# Patient Record
Sex: Female | Born: 1960
Health system: Southern US, Community
[De-identification: ages and names within clinical notes are randomized; demographics above are authoritative.]

## PROBLEM LIST (undated history)

## (undated) DIAGNOSIS — IMO0001 Reserved for inherently not codable concepts without codable children: Secondary | ICD-10-CM

## (undated) DIAGNOSIS — IMO0002 Reserved for concepts with insufficient information to code with codable children: Secondary | ICD-10-CM

## (undated) DIAGNOSIS — F329 Major depressive disorder, single episode, unspecified: Secondary | ICD-10-CM

## (undated) DIAGNOSIS — Z9221 Personal history of antineoplastic chemotherapy: Secondary | ICD-10-CM

## (undated) DIAGNOSIS — Z460 Encounter for fitting and adjustment of spectacles and contact lenses: Secondary | ICD-10-CM

## (undated) DIAGNOSIS — E785 Hyperlipidemia, unspecified: Secondary | ICD-10-CM

## (undated) DIAGNOSIS — M1990 Unspecified osteoarthritis, unspecified site: Secondary | ICD-10-CM

## (undated) DIAGNOSIS — F32A Depression, unspecified: Secondary | ICD-10-CM

## (undated) DIAGNOSIS — F419 Anxiety disorder, unspecified: Secondary | ICD-10-CM

## (undated) DIAGNOSIS — K219 Gastro-esophageal reflux disease without esophagitis: Secondary | ICD-10-CM

## (undated) DIAGNOSIS — E119 Type 2 diabetes mellitus without complications: Secondary | ICD-10-CM

## (undated) DIAGNOSIS — C50919 Malignant neoplasm of unspecified site of unspecified female breast: Secondary | ICD-10-CM

## (undated) DIAGNOSIS — Z923 Personal history of irradiation: Secondary | ICD-10-CM

## (undated) HISTORY — PX: WISDOM TOOTH EXTRACTION: SHX21

## (undated) HISTORY — DX: Hyperlipidemia, unspecified: E78.5

## (undated) HISTORY — DX: Reserved for concepts with insufficient information to code with codable children: IMO0002

## (undated) HISTORY — DX: Reserved for inherently not codable concepts without codable children: IMO0001

## (undated) HISTORY — DX: Malignant neoplasm of unspecified site of unspecified female breast: C50.919

## (undated) HISTORY — DX: Type 2 diabetes mellitus without complications: E11.9

---

## 1994-12-30 HISTORY — PX: BREAST BIOPSY: SHX20

## 1998-07-13 ENCOUNTER — Ambulatory Visit (HOSPITAL_COMMUNITY): Admission: RE | Admit: 1998-07-13 | Discharge: 1998-07-13 | Payer: Self-pay | Admitting: Obstetrics & Gynecology

## 1999-09-20 ENCOUNTER — Other Ambulatory Visit: Admission: RE | Admit: 1999-09-20 | Discharge: 1999-09-20 | Payer: Self-pay | Admitting: Obstetrics & Gynecology

## 2000-10-23 ENCOUNTER — Encounter: Payer: Self-pay | Admitting: Obstetrics & Gynecology

## 2000-10-23 ENCOUNTER — Ambulatory Visit (HOSPITAL_COMMUNITY): Admission: RE | Admit: 2000-10-23 | Discharge: 2000-10-23 | Payer: Self-pay | Admitting: Obstetrics & Gynecology

## 2000-10-23 ENCOUNTER — Other Ambulatory Visit: Admission: RE | Admit: 2000-10-23 | Discharge: 2000-10-23 | Payer: Self-pay | Admitting: Obstetrics & Gynecology

## 2001-11-13 ENCOUNTER — Ambulatory Visit (HOSPITAL_COMMUNITY): Admission: RE | Admit: 2001-11-13 | Discharge: 2001-11-13 | Payer: Self-pay | Admitting: Obstetrics & Gynecology

## 2001-11-13 ENCOUNTER — Encounter: Payer: Self-pay | Admitting: Obstetrics & Gynecology

## 2001-12-21 ENCOUNTER — Other Ambulatory Visit: Admission: RE | Admit: 2001-12-21 | Discharge: 2001-12-21 | Payer: Self-pay | Admitting: Obstetrics & Gynecology

## 2002-11-16 ENCOUNTER — Ambulatory Visit (HOSPITAL_COMMUNITY): Admission: RE | Admit: 2002-11-16 | Discharge: 2002-11-16 | Payer: Self-pay | Admitting: Obstetrics & Gynecology

## 2002-11-16 ENCOUNTER — Encounter: Payer: Self-pay | Admitting: Obstetrics & Gynecology

## 2002-12-13 ENCOUNTER — Other Ambulatory Visit: Admission: RE | Admit: 2002-12-13 | Discharge: 2002-12-13 | Payer: Self-pay | Admitting: Obstetrics & Gynecology

## 2003-12-20 ENCOUNTER — Ambulatory Visit (HOSPITAL_COMMUNITY): Admission: RE | Admit: 2003-12-20 | Discharge: 2003-12-20 | Payer: Self-pay | Admitting: Obstetrics & Gynecology

## 2003-12-20 ENCOUNTER — Other Ambulatory Visit: Admission: RE | Admit: 2003-12-20 | Discharge: 2003-12-20 | Payer: Self-pay | Admitting: Obstetrics & Gynecology

## 2005-01-04 ENCOUNTER — Other Ambulatory Visit: Admission: RE | Admit: 2005-01-04 | Discharge: 2005-01-04 | Payer: Self-pay | Admitting: Obstetrics & Gynecology

## 2005-01-04 ENCOUNTER — Ambulatory Visit (HOSPITAL_COMMUNITY): Admission: RE | Admit: 2005-01-04 | Discharge: 2005-01-04 | Payer: Self-pay | Admitting: Obstetrics & Gynecology

## 2005-06-18 ENCOUNTER — Other Ambulatory Visit: Admission: RE | Admit: 2005-06-18 | Discharge: 2005-06-18 | Payer: Self-pay | Admitting: Obstetrics & Gynecology

## 2006-02-04 ENCOUNTER — Ambulatory Visit (HOSPITAL_COMMUNITY): Admission: RE | Admit: 2006-02-04 | Discharge: 2006-02-04 | Payer: Self-pay | Admitting: Obstetrics & Gynecology

## 2006-02-07 ENCOUNTER — Other Ambulatory Visit: Admission: RE | Admit: 2006-02-07 | Discharge: 2006-02-07 | Payer: Self-pay | Admitting: Obstetrics & Gynecology

## 2006-06-30 ENCOUNTER — Ambulatory Visit: Payer: Self-pay | Admitting: Internal Medicine

## 2006-11-24 ENCOUNTER — Ambulatory Visit: Payer: Self-pay | Admitting: Internal Medicine

## 2007-02-05 ENCOUNTER — Ambulatory Visit (HOSPITAL_COMMUNITY): Admission: RE | Admit: 2007-02-05 | Discharge: 2007-02-05 | Payer: Self-pay | Admitting: Obstetrics & Gynecology

## 2007-02-06 ENCOUNTER — Ambulatory Visit: Payer: Self-pay | Admitting: Internal Medicine

## 2007-03-07 ENCOUNTER — Ambulatory Visit (HOSPITAL_COMMUNITY): Admission: RE | Admit: 2007-03-07 | Discharge: 2007-03-07 | Payer: Self-pay | Admitting: Family Medicine

## 2007-03-07 ENCOUNTER — Ambulatory Visit: Payer: Self-pay | Admitting: Internal Medicine

## 2008-04-01 ENCOUNTER — Ambulatory Visit (HOSPITAL_COMMUNITY): Admission: RE | Admit: 2008-04-01 | Discharge: 2008-04-01 | Payer: Self-pay | Admitting: Obstetrics & Gynecology

## 2008-09-13 ENCOUNTER — Telehealth (INDEPENDENT_AMBULATORY_CARE_PROVIDER_SITE_OTHER): Payer: Self-pay | Admitting: Internal Medicine

## 2009-05-26 ENCOUNTER — Ambulatory Visit (HOSPITAL_COMMUNITY): Admission: RE | Admit: 2009-05-26 | Discharge: 2009-05-26 | Payer: Self-pay | Admitting: Obstetrics & Gynecology

## 2009-12-21 ENCOUNTER — Ambulatory Visit (HOSPITAL_COMMUNITY): Admission: RE | Admit: 2009-12-21 | Discharge: 2009-12-21 | Payer: Self-pay | Admitting: Orthopedic Surgery

## 2010-07-19 ENCOUNTER — Ambulatory Visit (HOSPITAL_COMMUNITY): Admission: RE | Admit: 2010-07-19 | Discharge: 2010-07-19 | Payer: Self-pay | Admitting: Obstetrics & Gynecology

## 2011-01-20 ENCOUNTER — Encounter: Payer: Self-pay | Admitting: Obstetrics & Gynecology

## 2011-04-15 ENCOUNTER — Inpatient Hospital Stay (INDEPENDENT_AMBULATORY_CARE_PROVIDER_SITE_OTHER)
Admission: RE | Admit: 2011-04-15 | Discharge: 2011-04-15 | Disposition: A | Discharge status: Home / Self Care | Payer: 59 | Source: Ambulatory Visit | Attending: Emergency Medicine | Admitting: Emergency Medicine

## 2011-04-15 DIAGNOSIS — L989 Disorder of the skin and subcutaneous tissue, unspecified: Secondary | ICD-10-CM

## 2011-04-16 LAB — ROCKY MTN SPOTTED FVR AB, IGM-BLOOD: RMSF IgM: 0.34 IV (ref 0.00–0.89)

## 2011-04-16 LAB — ROCKY MTN SPOTTED FVR AB, IGG-BLOOD: RMSF IgG: 0.04 IV

## 2011-04-18 ENCOUNTER — Ambulatory Visit (INDEPENDENT_AMBULATORY_CARE_PROVIDER_SITE_OTHER): Payer: 59 | Admitting: Family Medicine

## 2011-04-18 ENCOUNTER — Encounter: Payer: Self-pay | Admitting: Family Medicine

## 2011-04-18 DIAGNOSIS — R030 Elevated blood-pressure reading, without diagnosis of hypertension: Secondary | ICD-10-CM | POA: Insufficient documentation

## 2011-04-18 DIAGNOSIS — Z136 Encounter for screening for cardiovascular disorders: Secondary | ICD-10-CM | POA: Insufficient documentation

## 2011-04-18 DIAGNOSIS — Z13220 Encounter for screening for lipoid disorders: Secondary | ICD-10-CM

## 2011-04-18 DIAGNOSIS — W57XXXA Bitten or stung by nonvenomous insect and other nonvenomous arthropods, initial encounter: Secondary | ICD-10-CM | POA: Insufficient documentation

## 2011-04-18 DIAGNOSIS — Z1211 Encounter for screening for malignant neoplasm of colon: Secondary | ICD-10-CM

## 2011-04-18 DIAGNOSIS — Z0000 Encounter for general adult medical examination without abnormal findings: Secondary | ICD-10-CM

## 2011-04-18 DIAGNOSIS — IMO0001 Reserved for inherently not codable concepts without codable children: Secondary | ICD-10-CM

## 2011-04-18 LAB — BASIC METABOLIC PANEL
CO2: 28 mEq/L (ref 19–32)
Chloride: 103 mEq/L (ref 96–112)
Creatinine, Ser: 0.6 mg/dL (ref 0.4–1.2)
Glucose, Bld: 97 mg/dL (ref 70–99)

## 2011-04-18 LAB — LIPID PANEL
Cholesterol: 192 mg/dL (ref 0–200)
Total CHOL/HDL Ratio: 4
VLDL: 36.4 mg/dL (ref 0.0–40.0)

## 2011-04-18 NOTE — Progress Notes (Signed)
50 yo female here for CPX and:  1.  Follow up urgent care- Notes reviewed. Seen on 04/15/2011 for bug bite on back. RMSF titers negative. Given doxycyline for 14 days, erythematous areas around bite have resolved. Denies any fevers, joint pain, myalgias or rashes.  2. Elevated BP- no h/o HTN. At urgent care, BP was elevated to 180/98.  Follow up in her office was 150/90. No blurred vision,CP or SOB.  3.  CPX- UTD on prevention but never had a colonoscopy. No family h/o colon CA.  The PMH, PSH, Social History, Family History, Medications, and allergies have been reviewed in [facility], and have been updated if relevant.  ROS: General: Denies fever, chills, sweats. No significant weight loss. Eyes: Denies blurring,significant itching ENT: Denies earache, sore throat, and hoarseness.  Cardiovascular: Denies chest pains, palpitations, dyspnea on exertion,  Respiratory: Denies cough, dyspnea at rest,wheeezing Breast: no concerns about lumps GI: Denies nausea, vomiting, diarrhea, constipation, change in bowel habits, abdominal pain, melena, hematochezia GU: Denies dysuria, hematuria, urinary hesitancy, nocturia, denies STD risk, no concerns about discharge Musculoskeletal: Denies back pain, joint pain Derm: Denies rash, itching Neuro: Denies  paresthesias, frequent falls, frequent headaches Psych: Denies depression, anxiety Endocrine: Denies cold intolerance, heat intolerance, polydipsia Heme: Denies enlarged lymph nodes Allergy: No hayfever  Physical exam: BP 140/82  Pulse 75  Temp(Src) 97.9 F (36.6 C) (Oral)  Ht 5\' 8"  (1.727 m)  Wt 199 lb 12.8 oz (90.629 kg)  BMI 30.38 kg/m2  LMP 04/02/2011  General:  Well-developed,well-nourished,in no acute distress; alert,appropriate and cooperative throughout examination Head:  normocephalic and atraumatic.   Eyes:  vision grossly intact, pupils equal, pupils round, and pupils reactive to light.   Ears:  R ear normal and L ear normal.   Nose:   no external deformity.   Mouth:  good dentition.   Neck:  No deformities, masses, or tenderness noted.  Lungs:  Normal respiratory effort, chest expands symmetrically. Lungs are clear to auscultation, no crackles or wheezes. Heart:  Normal rate and regular rhythm. S1 and S2 normal without gallop, murmur, click, rub or other extra sounds. Abdomen:  Bowel sounds positive,abdomen soft and non-tender without masses, organomegaly or hernias noted. Msk:  No deformity or scoliosis noted of thoracic or lumbar spine.   Extremities:  No clubbing, cyanosis, edema, or deformity noted with normal full range of motion of all joints.   Neurologic:  alert & oriented X3 and gait normal.   Skin:  Intact without suspicious lesions or rashes Well healing small, circular lesion on upper back.  No erythema or drainage. Cervical Nodes:  No lymphadenopathy noted Axillary Nodes:  No palpable lymphadenopathy Psych:  Cognition and judgment appear intact. Alert and cooperative with normal attention span and concentration. No apparent delusions, illusions, hallucinations

## 2011-04-18 NOTE — Assessment & Plan Note (Signed)
Resolving.  Finish course of doxycycline. 

## 2011-04-18 NOTE — Patient Instructions (Signed)
Great to see you. Please stop by to see Veronica Rogers on your way out.   

## 2011-04-18 NOTE — Assessment & Plan Note (Signed)
Reviewed preventive care protocols, scheduled due services, and updated immunizations Discussed nutrition, exercise, diet, and healthy lifestyle.  Orders Placed This Encounter  Procedures  . Basic Metabolic Panel (BMET)  . Lipid panel  . Ambulatory referral to Gastroenterology

## 2011-04-18 NOTE — Assessment & Plan Note (Signed)
New.  Likely secondary to infected bug bite. Now resolving.  She will continue to monitor at work and call me back with an update.

## 2011-04-23 ENCOUNTER — Telehealth: Payer: Self-pay | Admitting: *Deleted

## 2011-04-23 NOTE — Telephone Encounter (Signed)
I'm not familiar with that reaction with doxycyline but I cannot say for sure that it's not being caused by the medication. Please advise her to stop taking it and see how she feels.  If no improvement, needs to be seen for possible UTI.

## 2011-04-23 NOTE — Telephone Encounter (Signed)
Patient advised as instructed via telephone. 

## 2011-04-23 NOTE — Telephone Encounter (Signed)
Patient started on Doxycycline last week.  Patient complains of now having some low back pain, some blood when she wipes after urinating, Patient thinks that she is having a reaction to the medication, no diarrhea or nausea. Pharmacy-[facility].

## 2011-04-24 ENCOUNTER — Encounter: Payer: Self-pay | Admitting: Family Medicine

## 2011-04-25 ENCOUNTER — Ambulatory Visit (INDEPENDENT_AMBULATORY_CARE_PROVIDER_SITE_OTHER): Payer: 59 | Admitting: Family Medicine

## 2011-04-25 ENCOUNTER — Encounter: Payer: Self-pay | Admitting: Family Medicine

## 2011-04-25 ENCOUNTER — Encounter: Payer: Self-pay | Admitting: *Deleted

## 2011-04-25 VITALS — BP 150/90 | HR 82 | Temp 98.1°F | Ht 68.0 in | Wt 200.8 lb

## 2011-04-25 DIAGNOSIS — M549 Dorsalgia, unspecified: Secondary | ICD-10-CM

## 2011-04-25 LAB — POCT URINALYSIS DIPSTICK
Glucose, UA: NEGATIVE
Ketones, UA: NEGATIVE
Leukocytes, UA: NEGATIVE

## 2011-04-25 MED ORDER — CYCLOBENZAPRINE HCL 5 MG PO TABS: 5.0000 mg | ORAL_TABLET | Freq: Three times a day (TID) | ORAL | Status: DC | PRN

## 2011-04-25 MED ORDER — PREDNISONE 20 MG PO TABS: ORAL_TABLET | ORAL | Status: DC

## 2011-04-25 MED ORDER — TRAMADOL HCL 50 MG PO TABS: 50.0000 mg | ORAL_TABLET | Freq: Four times a day (QID) | ORAL | Status: AC | PRN

## 2011-04-25 NOTE — Assessment & Plan Note (Signed)
UA neg. Most consistent with acute lumbar strain. Given severity, will treat with short course of prednisone, cyclobenzaprine and tramadol. Discussed stretches. The patient indicates understanding of these issues and agrees with the plan.

## 2011-04-25 NOTE — Progress Notes (Signed)
50 yo female here for back pain.  Was on Doxycyline for tick bite but stopped taking it two days ago because lower back started hurting. Most painful with standing and sitting. Did notice some blood after she urinated once otherwise has been normal. No dysuria or increased urinary frequency. No fevers or chills.    No LE weakness or neuropathy.  The PMH, PSH, Social History, Family History, Medications, and allergies have been reviewed in [facility], and have been updated if relevant.  ROS: As per HPI General: Denies fever, chills, sweats. No significant weight loss. Eyes: Denies blurring,significant itching ENT: Denies earache, sore throat, and hoarseness.  Cardiovascular: Denies chest pains, palpitations, dyspnea on exertion,  Respiratory: Denies cough, dyspnea at rest,wheeezing GI: Denies nausea, vomiting, diarrhea, constipation, change in bowel habits, abdominal pain, melena, hematochezia GU: Denies dysuria, hematuria, , nocturia, denies STD risk, no concerns about discharge Derm: Denies rash, itching Neuro: Denies  paresthesias, frequent falls, frequent headaches Psych: Denies depression, anxiety Endocrine: Denies cold intolerance, heat intolerance, polydipsia Heme: Denies enlarged lymph nodes Allergy: No hayfever  Physical exam: BP 150/90  Pulse 82  Temp(Src) 98.1 F (36.7 C) (Oral)  Ht 5\' 8"  (1.727 m)  Wt 200 lb 12.8 oz (91.082 kg)  BMI 30.53 kg/m2  LMP 04/02/2011 General:  Well-developed,well-nourished,tearful when sits or stands otherwise in NAD. Head:  normocephalic and atraumatic.  d. Lungs:  Normal respiratory effort, chest expands symmetrically. Lungs are clear to auscultation, no crackles or wheezes. Heart:  Normal rate and regular rhythm. S1 and S2 normal without gallop, murmur, click, rub or other extra sounds. Abdomen:  Bowel sounds positive,abdomen soft and non-tender without masses, organomegaly or hernias noted. Msk:  No deformity or scoliosis noted of thoracic  or lumbar spine.   No CVA tenderness SLR pos left, neg right.   Extremities:  No clubbing, cyanosis, edema, or deformity noted with normal full range of motion of all joints.   Neurologic:  alert & oriented X3 and gait normal.   Skin:  Intact without suspicious lesions or rashes Well healing small, circular lesion on upper back.  No erythema or drainage. Cervical Nodes:  No lymphadenopathy noted Axillary Nodes:  No palpable lymphadenopathy Psych:  Cognition and judgment appear intact. Alert and cooperative with normal attention span and concentration. No apparent delusions, illusions, hallucinations

## 2011-05-17 ENCOUNTER — Ambulatory Visit (AMBULATORY_SURGERY_CENTER): Payer: 59 | Admitting: *Deleted

## 2011-05-17 ENCOUNTER — Encounter: Payer: Self-pay | Admitting: Gastroenterology

## 2011-05-17 VITALS — Ht 68.0 in | Wt 199.0 lb

## 2011-05-17 DIAGNOSIS — Z1211 Encounter for screening for malignant neoplasm of colon: Secondary | ICD-10-CM

## 2011-05-17 MED ORDER — PEG-KCL-NACL-NASULF-NA ASC-C 100 G PO SOLR: ORAL | Status: DC

## 2011-05-17 NOTE — Assessment & Plan Note (Signed)
[facility]                           PRIMARY CARE OFFICE NOTE   NAME:Veronica Rogers, Eye                     MRN:          [id number]  DATE:02/06/2007                            DOB:          [birth year]    Veronica Rogers works for [employer].  Veronica Rogers sees me acutely today because of earache,  cough, and congestion.  Veronica Rogers reports this has been going on for several  days and is concerned it may head into a more significant infection.   PHYSICAL EXAMINATION:  VITAL SIGNS:  Temperature 97.4, blood pressure  131/73, pulse 82.  GENERAL APPEARANCE:  A well-developed and well-nourished woman in no  acute distress.  HEENT:  The EACs and TM's appeared normal with no signs of inflammation.  Patient's TM's move poorly with insufflation.  Veronica Rogers has no significant  tenderness over her frontal or maxillary sinuses.  LUNGS:  Clear.   IMPRESSION/PLAN:  Viral upper respiratory infection with eustachian tube  dysfunction:  Patient is started on Allegra-D 12 hour, 1 b.i.d.   During the day, the patient reports Veronica Rogers did see significant improvement  with this regimen.  No antibiotics were indicated.     Rosalyn Gess Norins, MD  Electronically Signed    MEN/MedQ  DD: 02/07/2007  DT: 02/07/2007  Job #: [id number]

## 2011-05-31 ENCOUNTER — Encounter: Payer: Self-pay | Admitting: Gastroenterology

## 2011-05-31 ENCOUNTER — Ambulatory Visit (AMBULATORY_SURGERY_CENTER): Payer: 59 | Admitting: Gastroenterology

## 2011-05-31 VITALS — BP 130/94 | HR 76 | Temp 98.9°F | Resp 16 | Ht 68.0 in | Wt 194.0 lb

## 2011-05-31 DIAGNOSIS — Z1211 Encounter for screening for malignant neoplasm of colon: Secondary | ICD-10-CM

## 2011-05-31 HISTORY — PX: COLONOSCOPY: SHX174

## 2011-05-31 MED ORDER — SODIUM CHLORIDE 0.9 % IV SOLN: 500.0000 mL | INTRAVENOUS | Status: DC

## 2011-05-31 NOTE — Patient Instructions (Signed)
Normal colon.  No polyps or cancers.  Repeat colonoscopy in 10 years.  No need for FOBT (stool) testing for at least 5 years.  See blue and green discharge sheets.

## 2011-06-03 ENCOUNTER — Telehealth: Payer: Self-pay | Admitting: *Deleted

## 2011-06-03 NOTE — Telephone Encounter (Signed)
Left message to call us if she has questions.

## 2011-08-22 ENCOUNTER — Other Ambulatory Visit (HOSPITAL_COMMUNITY): Payer: Self-pay | Admitting: Obstetrics & Gynecology

## 2011-08-22 DIAGNOSIS — Z1231 Encounter for screening mammogram for malignant neoplasm of breast: Secondary | ICD-10-CM

## 2011-08-23 ENCOUNTER — Ambulatory Visit (HOSPITAL_COMMUNITY)
Admission: RE | Admit: 2011-08-23 | Discharge: 2011-08-23 | Disposition: A | Discharge status: Home / Self Care | Payer: 59 | Source: Ambulatory Visit | Attending: Obstetrics & Gynecology | Admitting: Obstetrics & Gynecology

## 2011-08-23 DIAGNOSIS — Z1231 Encounter for screening mammogram for malignant neoplasm of breast: Secondary | ICD-10-CM | POA: Insufficient documentation

## 2011-09-13 ENCOUNTER — Other Ambulatory Visit: Payer: Self-pay | Admitting: Obstetrics & Gynecology

## 2011-09-13 DIAGNOSIS — R928 Other abnormal and inconclusive findings on diagnostic imaging of breast: Secondary | ICD-10-CM

## 2011-09-27 ENCOUNTER — Ambulatory Visit
Admission: RE | Admit: 2011-09-27 | Discharge: 2011-09-27 | Disposition: A | Discharge status: Home / Self Care | Payer: 59 | Source: Ambulatory Visit | Attending: Obstetrics & Gynecology | Admitting: Obstetrics & Gynecology

## 2011-09-27 DIAGNOSIS — R928 Other abnormal and inconclusive findings on diagnostic imaging of breast: Secondary | ICD-10-CM

## 2012-04-02 ENCOUNTER — Ambulatory Visit (INDEPENDENT_AMBULATORY_CARE_PROVIDER_SITE_OTHER): Payer: 59 | Admitting: Family Medicine

## 2012-04-02 ENCOUNTER — Encounter: Payer: Self-pay | Admitting: Family Medicine

## 2012-04-02 VITALS — BP 120/82 | HR 72 | Temp 97.8°F | Wt 197.0 lb

## 2012-04-02 DIAGNOSIS — J069 Acute upper respiratory infection, unspecified: Secondary | ICD-10-CM

## 2012-04-02 MED ORDER — MELOXICAM 15 MG PO TABS: 15.0000 mg | ORAL_TABLET | Freq: Every day | ORAL | Status: DC

## 2012-04-02 MED ORDER — HYDROCOD POLST-CHLORPHEN POLST 10-8 MG/5ML PO LQCR
5.0000 mL | Freq: Every evening | ORAL | Status: DC | PRN
Start: 2012-04-02 — End: 2012-12-29

## 2012-04-02 NOTE — Patient Instructions (Signed)
This is likely a virus.  Drink lots of fluids.  Treat sympotmatically with Mucinex, nasal saline irrigation, and Tylenol/Ibuprofen.   Cough suppressant at night.  Call if not improving as expected in 5-7 days.    

## 2012-04-02 NOTE — Progress Notes (Signed)
SUBJECTIVE:  Veronica Rogers is a 51 y.o. female who complains of coryza, congestion, sore throat, dry cough and myalgias for 5 days. She denies a history of anorexia, chest pain, chills and dizziness and denies a history of asthma. Patient denies smoke cigarettes.   Patient Active Problem List  Diagnoses  . Bug bite with infection  . Elevated blood pressure (not hypertension)  . Screening for ischemic heart disease  . Routine general medical examination at a health care facility  . Back pain   No past medical history on file. Past Surgical History  Procedure Date  . Breast biopsy 1996    right breast, benign   History  Substance Use Topics  . Smoking status: Never Smoker   . Smokeless tobacco: Never Used  . Alcohol Use: Yes     1-2 drinks a month   Family History  Problem Relation Age of Onset  . Squamous cell carcinoma Father   . Cancer Father   . Diabetes Father   . Hypertension Mother   . Diabetes Sister    Allergies  Allergen Reactions  . Erythromycin Other (See Comments)    Stomach pain   Current Outpatient Prescriptions on File Prior to Visit  Medication Sig Dispense Refill  . cyclobenzaprine (FLEXERIL) 5 MG tablet Take 1 tablet (5 mg total) by mouth every 8 (eight) hours as needed for muscle spasms.  30 tablet  1  . doxycycline (DORYX) 100 MG EC tablet Take 100 mg by mouth 2 (two) times daily.        . meloxicam (MOBIC) 15 MG tablet Take 15 mg by mouth daily.        . norethindrone-ethinyl estradiol (JUNEL FE 1/20) 1-20 MG-MCG per tablet Take 1 tablet by mouth daily.        . sertraline (ZOLOFT) 50 MG tablet Take 50 mg by mouth daily.        . traMADol (ULTRAM) 50 MG tablet Take 1 tablet (50 mg total) by mouth every 6 (six) hours as needed for pain.  20 tablet  0   Current Facility-Administered Medications on File Prior to Visit  Medication Dose Route Frequency Provider Last Rate Last Dose  . 0.9 %  sodium chloride infusion  500 mL Intravenous Continuous  Rachael Fee, MD       The PMH, PSH, Social History, Family History, Medications, and allergies have been reviewed in [facility], and have been updated if relevant.  OBJECTIVE: BP 120/82  Pulse 72  Temp(Src) 97.8 F (36.6 C) (Oral)  Wt 197 lb (89.359 kg)  She appears well, vital signs are as noted. Ears normal.  Throat and pharynx normal.  Neck supple. No adenopathy in the neck. Nose is congested. Sinuses non tender. The chest is clear, without wheezes or rales.  ASSESSMENT:  viral upper respiratory illness  PLAN: Symptomatic therapy suggested: push fluids, rest and return office visit prn if symptoms persist or worsen. Lack of antibiotic effectiveness discussed with her. Call or return to clinic prn if these symptoms worsen or fail to improve as anticipated.

## 2012-06-30 ENCOUNTER — Other Ambulatory Visit (HOSPITAL_COMMUNITY): Payer: Self-pay | Admitting: Obstetrics & Gynecology

## 2012-06-30 DIAGNOSIS — Z1231 Encounter for screening mammogram for malignant neoplasm of breast: Secondary | ICD-10-CM

## 2012-09-01 ENCOUNTER — Ambulatory Visit (HOSPITAL_COMMUNITY)
Admission: RE | Admit: 2012-09-01 | Discharge: 2012-09-01 | Disposition: A | Discharge status: Home / Self Care | Payer: 59 | Source: Ambulatory Visit | Attending: Obstetrics & Gynecology | Admitting: Obstetrics & Gynecology

## 2012-09-01 DIAGNOSIS — Z1231 Encounter for screening mammogram for malignant neoplasm of breast: Secondary | ICD-10-CM | POA: Insufficient documentation

## 2012-12-29 ENCOUNTER — Encounter: Payer: Self-pay | Admitting: Family Medicine

## 2012-12-29 ENCOUNTER — Ambulatory Visit (INDEPENDENT_AMBULATORY_CARE_PROVIDER_SITE_OTHER): Payer: 59 | Admitting: Family Medicine

## 2012-12-29 VITALS — BP 120/82 | HR 76 | Temp 98.0°F | Wt 201.0 lb

## 2012-12-29 DIAGNOSIS — M549 Dorsalgia, unspecified: Secondary | ICD-10-CM

## 2012-12-29 MED ORDER — CYCLOBENZAPRINE HCL 5 MG PO TABS: 5.0000 mg | ORAL_TABLET | Freq: Three times a day (TID) | ORAL | Status: DC | PRN

## 2012-12-29 MED ORDER — MELOXICAM 15 MG PO TABS: 15.0000 mg | ORAL_TABLET | Freq: Every day | ORAL | Status: DC

## 2012-12-29 NOTE — Patient Instructions (Addendum)
Back Pain, Adult Low back pain is very common. About 1 in 5 people have back pain.The cause of low back pain is rarely dangerous. The pain often gets better over time.About half of people with a sudden onset of back pain feel better in just 2 weeks. About 8 in 10 people feel better by 6 weeks.  CAUSES Some common causes of back pain include:  Strain of the muscles or ligaments supporting the spine.  Wear and tear (degeneration) of the spinal discs.  Arthritis.  Direct injury to the back. DIAGNOSIS Most of the time, the direct cause of low back pain is not known.However, back pain can be treated effectively even when the exact cause of the pain is unknown.Answering your caregiver's questions about your overall health and symptoms is one of the most accurate ways to make sure the cause of your pain is not dangerous. If your caregiver needs more information, he or she may order lab work or imaging tests (X-rays or MRIs).However, even if imaging tests show changes in your back, this usually does not require surgery. HOME CARE INSTRUCTIONS For many people, back pain returns.Since low back pain is rarely dangerous, it is often a condition that people can learn to manageon their own.   Remain active. It is stressful on the back to sit or stand in one place. Do not sit, drive, or stand in one place for more than 30 minutes at a time. Take short walks on level surfaces as soon as pain allows.Try to increase the length of time you walk each day.  Do not stay in bed.Resting more than 1 or 2 days can delay your recovery.  Do not avoid exercise or work.Your body is made to move.It is not dangerous to be active, even though your back may hurt.Your back will likely heal faster if you return to being active before your pain is gone.  Pay attention to your body when you bend and lift. Many people have less discomfortwhen lifting if they bend their knees, keep the load close to their bodies,and  avoid twisting. Often, the most comfortable positions are those that put less stress on your recovering back.  Find a comfortable position to sleep. Use a firm mattress and lie on your side with your knees slightly bent. If you lie on your back, put a pillow under your knees.  Only take over-the-counter or prescription medicines as directed by your caregiver. Over-the-counter medicines to reduce pain and inflammation are often the most helpful.Your caregiver may prescribe muscle relaxant drugs.These medicines help dull your pain so you can more quickly return to your normal activities and healthy exercise.  Put ice on the injured area.  Put ice in a plastic bag.  Place a towel between your skin and the bag.  Leave the ice on for 15 to 20 minutes, 3 to 4 times a day for the first 2 to 3 days. After that, ice and heat may be alternated to reduce pain and spasms.  Ask your caregiver about trying back exercises and gentle massage. This may be of some benefit.  Avoid feeling anxious or stressed.Stress increases muscle tension and can worsen back pain.It is important to recognize when you are anxious or stressed and learn ways to manage it.Exercise is a great option. SEEK MEDICAL CARE IF:  You have pain that is not relieved with rest or medicine.  You have pain that does not improve in 1 week.  You have new symptoms.  You are generally   not feeling well. SEEK IMMEDIATE MEDICAL CARE IF:   You have pain that radiates from your back into your legs.  You develop new bowel or bladder control problems.  You have unusual weakness or numbness in your arms or legs.  You develop nausea or vomiting.  You develop abdominal pain.  You feel faint. Document Released: 12/16/2005 Document Revised: 06/16/2012 Document Reviewed: 05/06/2011 Exit[employer] Patient Information 2013 [employer].  

## 2012-12-29 NOTE — Progress Notes (Signed)
SUBJECTIVE:  Veronica Rogers is a 51 y.o. female who complains of low back pain for 1 week(s), positional with bending or lifting, without radiation down the legs. Precipitating factors: none recalled by the patient. Prior history of back problems: recurrent self limited episodes of low back pain in the past. There is not numbness in the legs.  Patient Active Problem List  Diagnosis  . Bug bite with infection  . Elevated blood pressure (not hypertension)  . Screening for ischemic heart disease  . Routine general medical examination at a health care facility  . Back pain   No past medical history on file. Past Surgical History  Procedure Date  . Breast biopsy 1996    right breast, benign   History  Substance Use Topics  . Smoking status: Never Smoker   . Smokeless tobacco: Never Used  . Alcohol Use: Yes     Comment: 1-2 drinks a month   Family History  Problem Relation Age of Onset  . Squamous cell carcinoma Father   . Cancer Father   . Diabetes Father   . Hypertension Mother   . Diabetes Sister    Allergies  Allergen Reactions  . Erythromycin Other (See Comments)    Stomach pain   Current Outpatient Prescriptions on File Prior to Visit  Medication Sig Dispense Refill  . meloxicam (MOBIC) 15 MG tablet Take 1 tablet (15 mg total) by mouth daily.  30 tablet  1  . norethindrone-ethinyl estradiol (JUNEL FE 1/20) 1-20 MG-MCG per tablet Take 1 tablet by mouth daily.        . sertraline (ZOLOFT) 50 MG tablet Take 50 mg by mouth daily.         Current Facility-Administered Medications on File Prior to Visit  Medication Dose Route Frequency Provider Last Rate Last Dose  . 0.9 %  sodium chloride infusion  500 mL Intravenous Continuous Rachael Fee, MD       The PMH, PSH, Social History, Family History, Medications, and allergies have been reviewed in [facility], and have been updated if relevant.  OBJECTIVE: BP 120/82  Pulse 76  Temp 98 F (36.7 C)  Wt 201 lb (91.173 kg)    Patient appears to be in mild to moderate pain, antalgic gait noted. Lumbosacral spine area reveals no local tenderness or mass.  Painful and reduced LS ROM noted. Straight leg raise is negative bilaterally.  DTR's, motor strength and sensation normal, including heel and toe gait.  Peripheral pulses are palpable. X-Ray: not indicated.  ASSESSMENT:  lumbar strain  PLAN: For acute pain, rest, intermittent application of heat (do not sleep on heating pad), analgesics and muscle relaxants are recommended. Discussed longer term treatment plan of prn NSAID's and discussed a home back care exercise program with flexion exercise routine. Proper lifting with avoidance of heavy lifting discussed. Consider Physical Therapy and XRay studies if not improving. Call or return to clinic prn if these symptoms worsen or fail to improve as anticipated.

## 2013-09-15 ENCOUNTER — Other Ambulatory Visit (HOSPITAL_COMMUNITY): Payer: Self-pay | Admitting: Obstetrics & Gynecology

## 2013-09-15 DIAGNOSIS — Z1231 Encounter for screening mammogram for malignant neoplasm of breast: Secondary | ICD-10-CM

## 2013-09-23 ENCOUNTER — Other Ambulatory Visit (HOSPITAL_COMMUNITY): Payer: Self-pay | Admitting: Obstetrics & Gynecology

## 2013-09-23 ENCOUNTER — Ambulatory Visit (HOSPITAL_COMMUNITY)
Admission: RE | Admit: 2013-09-23 | Discharge: 2013-09-23 | Disposition: A | Discharge status: Home / Self Care | Payer: 59 | Source: Ambulatory Visit | Attending: Obstetrics & Gynecology | Admitting: Obstetrics & Gynecology

## 2013-09-23 DIAGNOSIS — Z1231 Encounter for screening mammogram for malignant neoplasm of breast: Secondary | ICD-10-CM

## 2013-09-28 ENCOUNTER — Other Ambulatory Visit: Payer: Self-pay | Admitting: Obstetrics & Gynecology

## 2013-09-28 DIAGNOSIS — R928 Other abnormal and inconclusive findings on diagnostic imaging of breast: Secondary | ICD-10-CM

## 2013-10-11 ENCOUNTER — Ambulatory Visit
Admission: RE | Admit: 2013-10-11 | Discharge: 2013-10-11 | Disposition: A | Discharge status: Home / Self Care | Payer: 59 | Source: Ambulatory Visit | Attending: Obstetrics & Gynecology | Admitting: Obstetrics & Gynecology

## 2013-10-11 ENCOUNTER — Other Ambulatory Visit: Payer: Self-pay | Admitting: Obstetrics & Gynecology

## 2013-10-11 DIAGNOSIS — R928 Other abnormal and inconclusive findings on diagnostic imaging of breast: Secondary | ICD-10-CM

## 2013-10-12 ENCOUNTER — Ambulatory Visit
Admission: RE | Admit: 2013-10-12 | Discharge: 2013-10-12 | Disposition: A | Discharge status: Home / Self Care | Payer: 59 | Source: Ambulatory Visit | Attending: Obstetrics & Gynecology | Admitting: Obstetrics & Gynecology

## 2013-10-12 DIAGNOSIS — R928 Other abnormal and inconclusive findings on diagnostic imaging of breast: Secondary | ICD-10-CM

## 2013-10-14 ENCOUNTER — Telehealth: Payer: Self-pay | Admitting: *Deleted

## 2013-10-14 DIAGNOSIS — C50212 Malignant neoplasm of upper-inner quadrant of left female breast: Secondary | ICD-10-CM

## 2013-10-14 NOTE — Telephone Encounter (Signed)
Confirmed [facility] for 10/20/13 at 1200 .  Instructions and contact information given.

## 2013-10-20 ENCOUNTER — Other Ambulatory Visit (HOSPITAL_BASED_OUTPATIENT_CLINIC_OR_DEPARTMENT_OTHER): Payer: 59 | Admitting: Lab

## 2013-10-20 ENCOUNTER — Encounter: Payer: Self-pay | Admitting: Radiation Oncology

## 2013-10-20 ENCOUNTER — Encounter: Payer: Self-pay | Admitting: *Deleted

## 2013-10-20 ENCOUNTER — Ambulatory Visit
Admission: RE | Admit: 2013-10-20 | Discharge: 2013-10-20 | Disposition: A | Discharge status: Home / Self Care | Payer: 59 | Source: Ambulatory Visit | Attending: Radiation Oncology | Admitting: Radiation Oncology

## 2013-10-20 ENCOUNTER — Ambulatory Visit (HOSPITAL_BASED_OUTPATIENT_CLINIC_OR_DEPARTMENT_OTHER): Payer: Commercial Managed Care - PPO | Admitting: General Surgery

## 2013-10-20 ENCOUNTER — Ambulatory Visit: Payer: 59 | Attending: General Surgery | Admitting: Physical Therapy

## 2013-10-20 ENCOUNTER — Ambulatory Visit (HOSPITAL_BASED_OUTPATIENT_CLINIC_OR_DEPARTMENT_OTHER): Payer: 59 | Admitting: Oncology

## 2013-10-20 ENCOUNTER — Encounter: Payer: Self-pay | Admitting: Oncology

## 2013-10-20 ENCOUNTER — Encounter (INDEPENDENT_AMBULATORY_CARE_PROVIDER_SITE_OTHER): Payer: Self-pay | Admitting: General Surgery

## 2013-10-20 ENCOUNTER — Ambulatory Visit: Payer: 59

## 2013-10-20 VITALS — BP 153/88 | HR 97 | Temp 98.4°F | Resp 18 | Ht 68.0 in | Wt 198.0 lb

## 2013-10-20 DIAGNOSIS — R293 Abnormal posture: Secondary | ICD-10-CM | POA: Insufficient documentation

## 2013-10-20 DIAGNOSIS — C50212 Malignant neoplasm of upper-inner quadrant of left female breast: Secondary | ICD-10-CM

## 2013-10-20 DIAGNOSIS — C50919 Malignant neoplasm of unspecified site of unspecified female breast: Secondary | ICD-10-CM | POA: Insufficient documentation

## 2013-10-20 DIAGNOSIS — IMO0001 Reserved for inherently not codable concepts without codable children: Secondary | ICD-10-CM | POA: Insufficient documentation

## 2013-10-20 DIAGNOSIS — Z170 Estrogen receptor positive status [ER+]: Secondary | ICD-10-CM

## 2013-10-20 DIAGNOSIS — C50219 Malignant neoplasm of upper-inner quadrant of unspecified female breast: Secondary | ICD-10-CM

## 2013-10-20 DIAGNOSIS — M25569 Pain in unspecified knee: Secondary | ICD-10-CM | POA: Insufficient documentation

## 2013-10-20 DIAGNOSIS — F411 Generalized anxiety disorder: Secondary | ICD-10-CM

## 2013-10-20 LAB — COMPREHENSIVE METABOLIC PANEL (CC13)
Alkaline Phosphatase: 36 U/L — ABNORMAL LOW (ref 40–150)
Anion Gap: 12 mEq/L — ABNORMAL HIGH (ref 3–11)
BUN: 14.2 mg/dL (ref 7.0–26.0)
CO2: 25 mEq/L (ref 22–29)
Chloride: 101 mEq/L (ref 98–109)
Creatinine: 0.7 mg/dL (ref 0.6–1.1)
Glucose: 143 mg/dl — ABNORMAL HIGH (ref 70–140)
Potassium: 3.7 mEq/L (ref 3.5–5.1)
Total Bilirubin: 0.71 mg/dL (ref 0.20–1.20)

## 2013-10-20 LAB — CBC WITH DIFFERENTIAL/PLATELET
BASO%: 0.8 % (ref 0.0–2.0)
Basophils Absolute: 0.1 10*3/uL (ref 0.0–0.1)
EOS%: 1.5 % (ref 0.0–7.0)
HGB: 12.6 g/dL (ref 11.6–15.9)
MCH: 27.1 pg (ref 25.1–34.0)
MCHC: 32.9 g/dL (ref 31.5–36.0)
RDW: 13.6 % (ref 11.2–14.5)
lymph#: 3.7 10*3/uL — ABNORMAL HIGH (ref 0.9–3.3)

## 2013-10-20 NOTE — Progress Notes (Signed)
Patient ID: Veronica Rogers, female   DOB: 1961-05-03, 52 y.o.   MRN: [id number]  Chief Complaint  Patient presents with  . Other    HPI Veronica Rogers is a 52 y.o. female.  Referred by Dr Annamaria Helling HPI 76 yof who has no complaints referable to either breast who presents after undergoing screening mm (tomo) that showed a left breast mass.  By [place] this is 9 mm in size.  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  She is breast density category b.  She has no family history of breast cancer. She works at [employer]. She comes in today to discuss her options.   Past Medical History  Diagnosis Date  . Breast cancer     Past Surgical History  Procedure Laterality Date  . Breast biopsy  1996    right breast, benign    Family History  Problem Relation Age of Onset  . Squamous cell carcinoma Father   . Cancer Father   . Diabetes Father   . Hypertension Mother   . Diabetes Sister     Social History History  Substance Use Topics  . Smoking status: Never Smoker   . Smokeless tobacco: Never Used  . Alcohol Use: Yes     Comment: 1-2 drinks a month    Allergies  Allergen Reactions  . Erythromycin Other (See Comments)    Stomach pain    Current Outpatient Prescriptions  Medication Sig Dispense Refill  . meloxicam (MOBIC) 15 MG tablet Take 1 tablet (15 mg total) by mouth daily.  30 tablet  1  . norethindrone-ethinyl estradiol (JUNEL FE 1/20) 1-20 MG-MCG per tablet Take 1 tablet by mouth daily.        . sertraline (ZOLOFT) 50 MG tablet Take 25 mg by mouth daily.        Current Facility-Administered Medications  Medication Dose Route Frequency Provider Last Rate Last Dose  . 0.9 %  sodium chloride infusion  500 mL Intravenous Continuous Rachael Fee, MD        Review of Systems Review of Systems  Constitutional: Negative for fever, chills and unexpected weight change.  HENT:  Negative for congestion, hearing loss, sore throat, trouble swallowing and voice change.   Eyes: Negative for visual disturbance.  Respiratory: Negative for cough and wheezing.   Cardiovascular: Negative for chest pain, palpitations and leg swelling.  Gastrointestinal: Negative for nausea, vomiting, abdominal pain, diarrhea, constipation, blood in stool, abdominal distention and anal bleeding.  Genitourinary: Negative for hematuria, vaginal bleeding and difficulty urinating.  Musculoskeletal: Positive for arthralgias.  Skin: Negative for rash and wound.  Neurological: Negative for seizures, syncope and headaches.  Hematological: Negative for adenopathy. Does not bruise/bleed easily.  Psychiatric/Behavioral: Negative for confusion.    Last menstrual period 09/15/2013.  Physical Exam Physical Exam  Vitals reviewed. Constitutional: She appears well-developed and well-nourished.  Eyes: No scleral icterus.  Neck: Neck supple.  Cardiovascular: Normal rate, regular rhythm and normal heart sounds.   Pulmonary/Chest: Effort normal and breath sounds normal. She has no wheezes. She has no rales. Right breast exhibits no inverted nipple, no mass, no nipple discharge, no skin change and no tenderness. Left breast exhibits no inverted nipple, no mass, no nipple discharge, no skin change and no tenderness.  Abdominal: Soft. There is no tenderness.  Lymphadenopathy:    She has no cervical adenopathy.  She has no axillary adenopathy.       Right: No supraclavicular adenopathy present.       Left: No supraclavicular adenopathy present.    Data Reviewed EXAM:  DIGITAL DIAGNOSTIC UNILATERAL LEFT MAMMOGRAM LIMITED; ULTRASOUND  LEFT BREAST  COMPARISON: 09/01/2012, 09/27/2011, 07/19/2010, 05/26/2009,  04/01/2008 and 02/05/2007  ACR Breast Density Category b: There are scattered areas of  fibroglandular density.  FINDINGS:  Spot compression views demonstrate a spiculated 1 cm mass over the   slightly inner upper left breast.  Ultrasound is performed, showing an irregular bordered hypoechoic  solid mass with minimal shadowing at the 11:30 position of the left  breast 8 cm from the nipple measuring 8 x 9 x 9 mm. Ultrasound of  the left axilla demonstrates no abnormal appearing lymph nodes.  IMPRESSION:  Suspicious 8 x 9 x 9 mm irregular bordered hypoechoic mass at the  11:30 position of the left breast 8 cm from the nipple.  RECOMMENDATION:  Recommend ultrasound-guided core needle biopsy of the suspicious  mass.  I have discussed the findings and recommendations with the patient.  Results were also provided in writing at the conclusion of the  visit. If applicable, a reminder letter will be sent to the patient  regarding the next appointment.  BI-RADS CATEGORY 5: Highly suggestive of malignancy - appropriate  action should be taken.    Assessment    Clinical stage I left breast cancer    Plan    Left breast wire guided lumpectomy, left axillary sentinel node biopsy  We discussed indications for breast mri and decided not to pursue this.  She does not have dense breast tissue and this was easily found on mm.   We discussed the staging and pathophysiology of breast cancer. We discussed all of the different options for treatment for breast cancer including surgery, chemotherapy, radiation therapy, Herceptin, and antiestrogen therapy.   We discussed a sentinel lymph node biopsy as she does not appear to having lymph node involvement right now. We discussed the performance of that with injection of radioactive tracer and possibly blue dye. We discussed that she would have an incision underneath her axillary hairline. We discussed that there is a small chance of having a positive node with a sentinel lymph node biopsy and we will await the permanent pathology to make any other first further decisions in terms of her treatment. One of these options might be to return to the  operating room to perform an axillary lymph node dissection. We discussed about a 1-2% risk lifetime of chronic shoulder pain as well as lymphedema associated with a sentinel lymph node biopsy.  We discussed the options for treatment of the breast cancer which included lumpectomy versus a mastectomy. We discussed the performance of the lumpectomy with a wire placement. We discussed a 5% chance of a positive margin requiring reexcision in the operating room. We also discussed that she may need radiation therapy or antiestrogen therapy or both if she undergoes lumpectomy. We discussed the mastectomy and the postoperative care for that as well. We discussed that there is no difference in her survival whether she undergoes lumpectomy with radiation therapy or antiestrogen therapy versus a mastectomy. There is a slight difference in the local recurrence rate being 3-5% with lumpectomy and about 1% with a mastectomy. We discussed the risks of operation including bleeding, infection, possible reoperation. She understands her further therapy will be based on what her stages at the time of her operation.  Jamere Stidham 10/20/2013, 2:33 PM

## 2013-10-20 NOTE — Progress Notes (Signed)
Checked in new pt with no financial concerns. °

## 2013-10-20 NOTE — Progress Notes (Signed)
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 female who is seen out of the courtesy of Dr. Emelia Loron for an opinion concerning radiation therapy as part of management of patient's recently diagnosed left breast cancer. The patient is seen as part of the multidisciplinary breast clinic.  On September 25 the patient underwent a digital screening 3-D tomo synthesis study of the breast area. There were suspicious changes noted in the left breast and ultrasound and digital diagnostic mammography was recommended. Ultrasound of the left breast revealed a 8 x 9 x 9 mm irregular hypoechoic mass in the 11:30 position of the left breast 8 cm from the nipple. Spot compression views demonstrated a spiculated 1 cm mass over the inner upper aspect of the left breast. There were no other lesions noted on imaging studies. Patient proceeded to undergo needle core biopsy of the mass within the upper aspect of the left breast revealing invasive ductal carcinoma likely grade 2 or 3.   the tumor was estrogen receptor positive at 100% and progesterone receptor positive at 100%. Ki- 67 was 46%. There was no HER-2/neu amplification. With this information the patient is now seen for evaluation.Marland Kitchen  PREVIOUS RADIATION THERAPY: No  PAST MEDICAL HISTORY:  has a past medical history of Breast cancer.    PAST SURGICAL HISTORY: Past Surgical History  Procedure Laterality Date  . Breast biopsy  1996    right breast, benign    FAMILY HISTORY: family history includes Cancer in her father; Diabetes in her father and sister; Hypertension in her  mother; Squamous cell carcinoma in her father.  SOCIAL HISTORY:  reports that she has never smoked. She has never used smokeless tobacco. She reports that she drinks alcohol. She reports that she does not use illicit drugs.  ALLERGIES: Erythromycin  MEDICATIONS:  Current Outpatient Prescriptions  Medication Sig Dispense Refill  . meloxicam (MOBIC) 15 MG tablet Take 1 tablet (15 mg total) by mouth daily.  30 tablet  1  . norethindrone-ethinyl estradiol (JUNEL FE 1/20) 1-20 MG-MCG per tablet Take 1 tablet by mouth daily.        . sertraline (ZOLOFT) 50 MG tablet Take 25 mg by mouth daily.        Current Facility-Administered Medications  Medication Dose Route Frequency Provider Last Rate Last Dose  . 0.9 %  sodium chloride infusion  500 mL Intravenous Continuous Rachael Fee, MD        REVIEW OF SYSTEMS:  A 15 point review of systems is documented in the electronic medical record. This was obtained by the nursing staff. However, I reviewed this with the patient to discuss relevant findings and make appropriate changes.  Prior to diagnosis the patient denies any A. The left breast area nipple discharge or bleeding. She denied palpating any mass within the left breast. She denies any problems with swelling in her left arm or hand.   PHYSICAL EXAM: This is a very pleasant 52 year old female in no acute distress. She is accompanied by her daughter on evaluation today. Examination of the neck and supraclavicular region reveals no evidence of adenopathy. The axillary areas are  free of adenopathy. Examination of the lungs reveals them to be clear. The heart has a regular rhythm and rate. Examination of the right breast reveals no mass or nipple discharge. Examination of the left breast area reveals some bruising in the upper central aspect of the breast. There is some thickening in this area but no discrete masses palpable. There is no nipple discharge or bleeding noted.   KPS = 100  100 -  Normal; no complaints; no evidence of disease. 90   - Able to carry on normal activity; minor signs or symptoms of disease. 80   - Normal activity with effort; some signs or symptoms of disease. 78   - Cares for self; unable to carry on normal activity or to do active work. 60   - Requires occasional assistance, but is able to care for most of his personal needs. 50   - Requires considerable assistance and frequent medical care. 40   - Disabled; requires special care and assistance. 30   - Severely disabled; hospital admission is indicated although death not imminent. 20   - Very sick; hospital admission necessary; active supportive treatment necessary. 10   - Moribund; fatal processes progressing rapidly. 0     - Dead  Karnofsky DA, Abelmann WH, Craver LS and Burchenal [facility] [phone]) The use of the nitrogen mustards in the palliative treatment of carcinoma: with particular reference to bronchogenic carcinoma Cancer 1 [id number]  LABORATORY DATA:  Lab Results  Component Value Date   WBC 9.5 10/20/2013   HGB 12.6 10/20/2013   HCT 38.4 10/20/2013   MCV 82.6 10/20/2013   PLT 252 10/20/2013   Lab Results  Component Value Date   NA 139 10/20/2013   K 3.7 10/20/2013   CL 103 04/18/2011   CO2 25 10/20/2013   Lab Results  Component Value Date   ALT 17 10/20/2013   AST 12 10/20/2013   ALKPHOS 36* 10/20/2013   BILITOT 0.71 10/20/2013     RADIOGRAPHY: US Breast Left  10/11/2013   CLINICAL DATA:  Patient presents for additional views of the left breast as followup to a recent screening exam suggesting a possible mass.  EXAM: DIGITAL DIAGNOSTIC UNILATERAL LEFT MAMMOGRAM LIMITED; ULTRASOUND LEFT BREAST  COMPARISON:  09/01/2012, 09/27/2011, 07/19/2010, 05/26/2009, 04/01/2008 and 02/05/2007  ACR Breast Density Category b: There are scattered areas of fibroglandular density.  FINDINGS: Spot compression views demonstrate a spiculated 1 cm mass over the slightly inner upper left breast.  Ultrasound is  performed, showing an irregular bordered hypoechoic solid mass with minimal shadowing at the 11:30 position of the left breast 8 cm from the nipple measuring 8 x 9 x 9 mm. Ultrasound of the left axilla demonstrates no abnormal appearing lymph nodes.  IMPRESSION: Suspicious 8 x 9 x 9 mm irregular bordered hypoechoic mass at the 11:30 position of the left breast 8 cm from the nipple.  RECOMMENDATION: Recommend ultrasound-guided core needle biopsy of the suspicious mass.  I have discussed the findings and recommendations with the patient. Results were also provided in writing at the conclusion of the visit. If applicable, a reminder letter will be sent to the patient regarding the next appointment.  BI-RADS CATEGORY  5: Highly suggestive of malignancy - appropriate action should be taken.  Biopsy will be done today.   Electronically Signed   By: Elberta Fortis M.D.   On: 10/11/2013 16:33   Mm Digital Diag Ltd L  10/11/2013   CLINICAL DATA:  Patient presents for additional  views of the left breast as followup to a recent screening exam suggesting a possible mass.  EXAM: DIGITAL DIAGNOSTIC UNILATERAL LEFT MAMMOGRAM LIMITED; ULTRASOUND LEFT BREAST  COMPARISON:  09/01/2012, 09/27/2011, 07/19/2010, 05/26/2009, 04/01/2008 and 02/05/2007  ACR Breast Density Category b: There are scattered areas of fibroglandular density.  FINDINGS: Spot compression views demonstrate a spiculated 1 cm mass over the slightly inner upper left breast.  Ultrasound is performed, showing an irregular bordered hypoechoic solid mass with minimal shadowing at the 11:30 position of the left breast 8 cm from the nipple measuring 8 x 9 x 9 mm. Ultrasound of the left axilla demonstrates no abnormal appearing lymph nodes.  IMPRESSION: Suspicious 8 x 9 x 9 mm irregular bordered hypoechoic mass at the 11:30 position of the left breast 8 cm from the nipple.  RECOMMENDATION: Recommend ultrasound-guided core needle biopsy of the suspicious mass.  I have  discussed the findings and recommendations with the patient. Results were also provided in writing at the conclusion of the visit. If applicable, a reminder letter will be sent to the patient regarding the next appointment.  BI-RADS CATEGORY  5: Highly suggestive of malignancy - appropriate action should be taken.  Biopsy will be done today.   Electronically Signed   By: Elberta Fortis M.D.   On: 10/11/2013 16:33   Mm Digital Diagnostic Unilat L  10/11/2013   CLINICAL DATA:  The patient is post ultrasound-guided core biopsy of a 9 mm spiculated mass over the 11:30 position of the left breast.  EXAM: DIGITAL DIAGNOSTIC UNILATERAL LEFT MAMMOGRAM  COMPARISON:  Previous exams  FINDINGS: Mammographic images were obtained following ultrasound guided biopsy of an irregular 9 mm mass of the 11:30 position of the left breast. Exam demonstrates satisfactory placement of a cylindrical shaped metallic clip over the biopsied mass in the upper inner left breast.  IMPRESSION: Satisfactory clip placement post ultrasound core biopsy left breast.  Final Assessment: Post Procedure Mammograms for Marker Placement   Electronically Signed   By: Elberta Fortis M.D.   On: 10/11/2013 17:04   Mm Radiologist Eval And Mgmt  10/12/2013   EXAM: ESTABLISHED PATIENT OFFICE VISIT -LEVEL II ([id number])  HISTORY OF PRESENT ILLNESS: Screening detected left breast mass. Ultrasound-guided core needle biopsy was performed yesterday. Pathology returned as grade 2-3 invasive ductal carcinoma.  Patient denies significant pain or bleeding at the biopsy site.  CHIEF COMPLAINT: Patient returns 1 day post ultrasound-guided core needle biopsy of a screening detected 0.9 cm mass in the upper left breast.  PHYSICAL EXAMINATION: Biopsy site in the upper left breast is clean without evidence of bleeding from the incision. The breast is soft on palpation and there is no evidence of hematoma.  ASSESSMENT AND PLAN: 0.9 cm mass in the upper left breast, pathology from  ultrasound-guided core needle biopsy revealing grade 2-3 invasive ductal carcinoma, concordant with imaging findings.  Patient is scheduled for the Multidisciplinary Breast Cancer Clinic at the [facility] on Wednesday, October 22.   Electronically Signed   By: Hulan Saas M.D.   On: 10/12/2013 17:08   [place] Lt Breast Bx W Loc Dev 1st Lesion Img Bx Spec US Guide  10/11/2013   CLINICAL DATA:  Patient presents for ultrasound-guided core needle biopsy of a 9 mm suspicious mass at the 11:30 position of the left breast 8 cm from the nipple.  EXAM: RADIOLOGY EXAMINATION  COMPARISON:  Previous exams.  PROCEDURE: I met with the patient and we discussed  the procedure of ultrasound-guided biopsy, including benefits and alternatives. We discussed the high likelihood of a successful procedure. We discussed the risks of the procedure including infection, bleeding, tissue injury, clip migration, and inadequate sampling. Informed written consent was given.  Using sterile technique and 2% Lidocaine as local anesthetic, under direct ultrasound visualization, a 12 gauge vacuum-assisteddevice was used to perform biopsy of the targeted mass at the 11:30 position using a lateral to medial approach. At the conclusion of the procedure, a top hat shaped tissue marker clip was deployed into the biopsy cavity. Follow-up 2-view mammogram was performed and dictated separately.  The usual time-out protocol was performed immediately prior to the procedure.  IMPRESSION: Ultrasound-guided biopsy of a suspicious left breast mass. No apparent complications.   Electronically Signed   By: Elberta Fortis M.D.   On: 10/11/2013 16:41   Mm Digital Screening 3d Tomo  09/24/2013   CLINICAL DATA:  Screening.  EXAM: DIGITAL SCREENING BILATERAL MAMMOGRAM WITH 3D TOMO WITH CAD  Digital breast tomosynthesis images are acquired in two projections. These images are reviewed in combination with the digital mammogram,  confirming the findings below.  COMPARISON:  Previous exam(s)  ACR Breast Density Category b: There is a scattered fibroglandular pattern.  FINDINGS: In the left breast, a possible mass warrants further evaluation with spot compression views and possibly ultrasound. In the right breast, no masses or malignant type calcifications are identified. Images were processed with CAD.  IMPRESSION: Further evaluation is suggested for possible mass in the left breast.  RECOMMENDATION: Diagnostic mammogram and possibly ultrasound of the left breast. (Code:FI-L-62M)  The patient will be contacted regarding the findings, and additional imaging will be scheduled.  BI-RADS CATEGORY  0: Incomplete. Need additional imaging evaluation and/or prior mammograms for comparison.   Electronically Signed   By: Rosalie Gums M.D.   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  The patient would be a good candidate for breast conservation therapy with lumpectomy sentinel node procedure and radiation therapy directed at the left breast area. Patient also be a candidate for mastectomy for local regional management but the patient not interested in this treatment approach. I discussed the general course of treatment,  side effects and potential toxicities of radiation therapy in this situation with the patient and her daughter. Patient appears to understand and wishes to proceed with planned course of treatment.  PLAN: Patient will be scheduled for surgery in the near future. She will be seen in the post operative setting for further evaluation and coordination of her anticipated radiation treatments as part of breast conservation therapy.  I spent 40 minutes minutes face to face with the patient and more than 50% of that time was spent in counseling and/or coordination of care.   ------------------------------------------------  -----------------------------------  Billie Lade, PhD, MD

## 2013-10-22 ENCOUNTER — Encounter: Payer: Self-pay | Admitting: *Deleted

## 2013-10-24 NOTE — Progress Notes (Signed)
LAWANNA CECERE [id number] 1961/06/04 52 y.o. 10/24/2013 9:12 PM  CC Richarda Osmond, MD [address] [place] [address]  REASON FOR CONSULTATION:  52 year old female with left breast cancer stage I. 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 Emelia Loron  HISTORY OF PRESENT ILLNESS:  JILLIAN WARTH is a 52 y.o. female.  Past medical history significant for anxiety. Patient had a screening mammogram performed that revealed a left breast mass measuring 9 mm. By ultrasound it was 9 mm at the 11:30 o'clock position 8 cm from the nipple.  She did not have an MRI performed. 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 Patient's case was discussed at the multidisciplinary breast conference. She is now seen in the multidisciplinary breast clinic for discussion of treatment options. She was seen by Dr. Emelia Loron as well as Dr. Antony Blackbird. She is without any complaints except for anxiety and nervousness.   Past Medical History: Past Medical History  Diagnosis Date  . Breast cancer     Past Surgical History: Past Surgical History  Procedure Laterality Date  . Breast biopsy  1996    right breast, benign    Family History: Family History  Problem Relation Age of Onset  . Squamous cell carcinoma Father   . Cancer Father   . Diabetes Father   . Hypertension Mother   . Diabetes Sister     Social History History  Substance Use Topics  . Smoking status: Never Smoker   . Smokeless tobacco: Never Used  . Alcohol Use: Yes     Comment: 1-2  drinks a month    Allergies: Allergies  Allergen Reactions  . Erythromycin Other (See Comments)    Stomach pain    Current Medications: Current Outpatient Prescriptions  Medication Sig Dispense Refill  . norethindrone-ethinyl estradiol (JUNEL FE 1/20) 1-20 MG-MCG per tablet Take 1 tablet by mouth daily.        . sertraline (ZOLOFT) 50 MG tablet Take 25 mg by mouth daily.       . meloxicam (MOBIC) 15 MG tablet Take 1 tablet (15 mg total) by mouth daily.  30 tablet  1   No current facility-administered medications for this visit.    OB/GYN History:menarche at age 55 she is premenopausal last menstrual period was on 10/13/2013 she has been on birth control pills to the present she is at first live birth at age 31.  Fertility Discussion:patient has completed her family Prior History of Cancer:no prior history  Health Maintenance:  Colonoscopy 2012 Bone Density no Last PAP smear September 2014  ECOG PERFORMANCE STATUS: 0 - Asymptomatic  Genetic Counseling/testing: no  REVIEW OF SYSTEMS:  A comprehensive review of systems was negative.  PHYSICAL EXAMINATION: Blood pressure 153/88, pulse 97, temperature 98.4 F (36.9 C), temperature source Oral, resp. rate 18, height 5\' 8"  (1.727 m), weight 198 lb (89.812 kg), last menstrual period 09/15/2013.  XBJ:YNWGN, healthy, no distress, well nourished and well developed SKIN: skin color, texture, turgor are normal HEAD: Normocephalic EYES: PERRLA, EOMI, Conjunctiva are pink and non-injected  EARS: External ears normal OROPHARYNX:no exudate, no erythema and lips, buccal mucosa, and tongue normal  NECK: no adenopathy, thyroid normal size, non-tender, without nodularity LYMPH:  no palpable lymphadenopathy, no hepatosplenomegaly BREAST:right breast normal without mass, skin or nipple changes or axillary nodes, left breast normal without mass, skin or nipple changes or axillary nodes LUNGS: clear to auscultation and percussion HEART:  regular rate & rhythm ABDOMEN:abdomen soft, non-tender, normal bowel sounds and no masses or organomegaly BACK: No CVA tenderness EXTREMITIES:less then 2 second capillary refill, no edema, no clubbing, no cyanosis  NEURO: alert & oriented x 3 with fluent speech, no focal motor/sensory deficits, gait normal     STUDIES/RESULTS: US Breast Left  2013/11/02   CLINICAL DATA:  Patient presents for additional views of the left breast as followup to a recent screening exam suggesting a possible mass.  EXAM: DIGITAL DIAGNOSTIC UNILATERAL LEFT MAMMOGRAM LIMITED; ULTRASOUND LEFT BREAST  COMPARISON:  09/01/2012, 09/27/2011, 07/19/2010, 05/26/2009, 04/01/2008 and 02/05/2007  ACR Breast Density Category b: There are scattered areas of fibroglandular density.  FINDINGS: Spot compression views demonstrate a spiculated 1 cm mass over the slightly inner upper left breast.  Ultrasound is performed, showing an irregular bordered hypoechoic solid mass with minimal shadowing at the 11:30 position of the left breast 8 cm from the nipple measuring 8 x 9 x 9 mm. Ultrasound of the left axilla demonstrates no abnormal appearing lymph nodes.  IMPRESSION: Suspicious 8 x 9 x 9 mm irregular bordered hypoechoic mass at the 11:30 position of the left breast 8 cm from the nipple.  RECOMMENDATION: Recommend ultrasound-guided core needle biopsy of the suspicious mass.  I have discussed the findings and recommendations with the patient. Results were also provided in writing at the conclusion of the visit. If applicable, a reminder letter will be sent to the patient regarding the next appointment.  BI-RADS CATEGORY  5: Highly suggestive of malignancy - appropriate action should be taken.  Biopsy will be done today.   Electronically Signed   By: Elberta Fortis M.D.   On: 11-02-2013 16:33   Mm Digital Diag Ltd L  2013/11/02   CLINICAL DATA:  Patient presents for additional views of the left breast as followup to a recent screening exam  suggesting a possible mass.  EXAM: DIGITAL DIAGNOSTIC UNILATERAL LEFT MAMMOGRAM LIMITED; ULTRASOUND LEFT BREAST  COMPARISON:  09/01/2012, 09/27/2011, 07/19/2010, 05/26/2009, 04/01/2008 and 02/05/2007  ACR Breast Density Category b: There are scattered areas of fibroglandular density.  FINDINGS: Spot compression views demonstrate a spiculated 1 cm mass over the slightly inner upper left breast.  Ultrasound is performed, showing an irregular bordered hypoechoic solid mass with minimal shadowing at the 11:30 position of the left breast 8 cm from the nipple measuring 8 x 9 x 9 mm. Ultrasound of the left axilla demonstrates no abnormal appearing lymph nodes.  IMPRESSION: Suspicious 8 x 9 x 9 mm irregular bordered hypoechoic mass at the 11:30 position of the left breast 8 cm from the nipple.  RECOMMENDATION: Recommend ultrasound-guided core needle biopsy of the suspicious mass.  I have discussed the findings and recommendations with the patient. Results were also provided in writing at the conclusion of the visit. If applicable, a reminder letter will be sent to the patient regarding the next appointment.  BI-RADS CATEGORY  5: Highly suggestive of malignancy - appropriate action should be taken.  Biopsy will be done today.   Electronically Signed   By: Elberta Fortis M.D.   On: 11-02-13 16:33  Mm Digital Diagnostic Unilat L  10/11/2013   CLINICAL DATA:  The patient is post ultrasound-guided core biopsy of a 9 mm spiculated mass over the 11:30 position of the left breast.  EXAM: DIGITAL DIAGNOSTIC UNILATERAL LEFT MAMMOGRAM  COMPARISON:  Previous exams  FINDINGS: Mammographic images were obtained following ultrasound guided biopsy of an irregular 9 mm mass of the 11:30 position of the left breast. Exam demonstrates satisfactory placement of a cylindrical shaped metallic clip over the biopsied mass in the upper inner left breast.  IMPRESSION: Satisfactory clip placement post ultrasound core biopsy left breast.  Final  Assessment: Post Procedure Mammograms for Marker Placement   Electronically Signed   By: Elberta Fortis M.D.   On: 10/11/2013 17:04   Mm Radiologist Eval And Mgmt  10/12/2013   EXAM: ESTABLISHED PATIENT OFFICE VISIT -LEVEL II ([id number])  HISTORY OF PRESENT ILLNESS: Screening detected left breast mass. Ultrasound-guided core needle biopsy was performed yesterday. Pathology returned as grade 2-3 invasive ductal carcinoma.  Patient denies significant pain or bleeding at the biopsy site.  CHIEF COMPLAINT: Patient returns 1 day post ultrasound-guided core needle biopsy of a screening detected 0.9 cm mass in the upper left breast.  PHYSICAL EXAMINATION: Biopsy site in the upper left breast is clean without evidence of bleeding from the incision. The breast is soft on palpation and there is no evidence of hematoma.  ASSESSMENT AND PLAN: 0.9 cm mass in the upper left breast, pathology from ultrasound-guided core needle biopsy revealing grade 2-3 invasive ductal carcinoma, concordant with imaging findings.  Patient is scheduled for the Multidisciplinary Breast Cancer Clinic at the [facility] on Wednesday, October 22.   Electronically Signed   By: Hulan Saas M.D.   On: 10/12/2013 17:08   [place] Lt Breast Bx W Loc Dev 1st Lesion Img Bx Spec US Guide  10/11/2013   CLINICAL DATA:  Patient presents for ultrasound-guided core needle biopsy of a 9 mm suspicious mass at the 11:30 position of the left breast 8 cm from the nipple.  EXAM: RADIOLOGY EXAMINATION  COMPARISON:  Previous exams.  PROCEDURE: I met with the patient and we discussed the procedure of ultrasound-guided biopsy, including benefits and alternatives. We discussed the high likelihood of a successful procedure. We discussed the risks of the procedure including infection, bleeding, tissue injury, clip migration, and inadequate sampling. Informed written consent was given.  Using sterile technique and 2% Lidocaine as local  anesthetic, under direct ultrasound visualization, a 12 gauge vacuum-assisteddevice was used to perform biopsy of the targeted mass at the 11:30 position using a lateral to medial approach. At the conclusion of the procedure, a top hat shaped tissue marker clip was deployed into the biopsy cavity. Follow-up 2-view mammogram was performed and dictated separately.  The usual time-out protocol was performed immediately prior to the procedure.  IMPRESSION: Ultrasound-guided biopsy of a suspicious left breast mass. No apparent complications.   Electronically Signed   By: Elberta Fortis M.D.   On: 10/11/2013 16:41     LABS:    Chemistry      Component Value Date/Time   NA 139 10/20/2013 1221   NA 140 04/18/2011 1136   K 3.7 10/20/2013 1221   K 3.8 04/18/2011 1136   CL 103 04/18/2011 1136   CO2 25 10/20/2013 1221   CO2 28 04/18/2011 1136   BUN 14.2 10/20/2013 1221   BUN 13 04/18/2011 1136   CREATININE 0.7 10/20/2013 1221   CREATININE 0.6 04/18/2011  1136      Component Value Date/Time   CALCIUM 9.9 10/20/2013 1221   CALCIUM 9.3 04/18/2011 1136   ALKPHOS 36* 10/20/2013 1221   AST 12 10/20/2013 1221   ALT 17 10/20/2013 1221   BILITOT 0.71 10/20/2013 1221      Lab Results  Component Value Date   WBC 9.5 10/20/2013   HGB 12.6 10/20/2013   HCT 38.4 10/20/2013   MCV 82.6 10/20/2013   PLT 252 10/20/2013   PATHOLOGY: PROGNOSTIC INDICATORS - ACIS Results: IMMUNOHISTOCHEMICAL AND MORPHOMETRIC ANALYSIS BY THE AUTOMATED CELLULAR IMAGING SYSTEM (ACIS) Estrogen Receptor: 100%, POSITIVE, STRONG STAINING INTENSITY Progesterone Receptor: 100%STRONG, POSITIVE, STRONG STAINING INTENSITY Proliferation Marker Ki67: 46% REFERENCE RANGE ESTROGEN RECEPTOR NEGATIVE <1% POSITIVE =>1% PROGESTERONE RECEPTOR NEGATIVE <1% POSITIVE =>1% All controls stained appropriately Abigail Miyamoto MD Pathologist, Electronic Signature ( Signed 10/15/2013) CHROMOGENIC IN-SITU HYBRIDIZATION Results: HER-2/NEU BY CISH  - NO AMPLIFICATION OF HER-2 DETECTED. RESULT RATIO OF HER2: CEP 17 SIGNALS 0.97 AVERAGE HER2 COPY NUMBER PER CELL 1.85 REFERENCE RANGE 1 of 3 FINAL for NEESHA, LANGTON [phone]) ADDITIONAL INFORMATION:(continued) NEGATIVE HER2/Chr17 Ratio <2.0 and Average HER2 copy number <4.0 EQUIVOCAL HER2/Chr17 Ratio <2.0 and Average HER2 copy number 4.0 and <6.0 POSITIVE HER2/Chr17 Ratio >=2.0 and/or Average HER2 copy number >=6.0 Abigail Miyamoto MD Pathologist, Electronic Signature ( Signed 10/14/2013) FINAL DIAGNOSIS Diagnosis Breast, left, needle core biopsy, mass, 11:30 o'clock, 8 cm fn - INVASIVE DUCTAL CARCINOMA. - SEE COMMENT. Microscopic Comment Although definitive grading of breast carcinoma is best done on excision, the features of the invasive tumor from the left 11:30 'clock biopsy are compatible with a grade II-III breast carcinoma. Breast prognostic markers will be performed and reported in an addendum. Findings are called to [facility] on 10/12/13. Dr. Frederica Kuster has seen this case in consultation with agreement. (RH:kh 10-12-13) Zandra Abts MD Pathologist, Electronic Signature (Case signed 10/12/2013) S ASSESSMENT    52 year old female with a screen detected left breast mass measuring 9 mm by ultrasound at the 11:30 o'clock position 8 cm from the nipple. She did not get an MRI. 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 Clinical staging is T1 NX ask stage I.  #2 patient and I discussed her radiology and pathology and treatment options. Certainly she is a good candidate for lumpectomy with sentinel lymph node biopsy. She was seen by Dr. Emelia Loron. She will also need radiation therapy.  #3 patient and I discussed adjuvant systemic treatment. Since her tumor is ER positive she will be a good candidate for  antiestrogen therapy with tamoxifen. She is premenopausal therefore we will not be able to give her aromatase inhibitor or tamoxifen would be a good drug for her. We discussed the son side effects as well as length of therapy. Patient is on birth control have recommended that she discontinue these.  We discussed this extensively as her for discontinuation of birth control pills.  Clinical Trial Eligibility: no Multidisciplinary conference discussion yes     PLAN:    #1 patient will proceed with a lumpectomy and sentinel lymph node biopsy.  #2 we'll see the patient after her surgery to discuss her final pathology and to discuss what treatment she may be eligible for other than antiestrogen therapy. Certainly for her tumor turns out and then a centimeters she may need to have an Oncotype DX performed to determine  her risk of distant recurrence and to see whether or not she would need chemotherapy.  #3 I will see her back after her surgery.        Discussion: Patient is being treated per NCCN breast cancer care guidelines appropriate for stage.I   Thank you so much for allowing me to participate in the care of Denton Brick. I will continue to follow up the patient with you and assist in her care.  All questions were answered. The patient knows to call the clinic with any problems, questions or concerns. We can certainly see the patient much sooner if necessary.  I spent 40 minutes counseling the patient face to face. The total time spent in the appointment was 60 minutes2.  Drue Second, MD Medical/Oncology [facility] [phone] (beeper) [phone] (Office)

## 2013-10-25 ENCOUNTER — Telehealth: Payer: Self-pay | Admitting: *Deleted

## 2013-10-25 ENCOUNTER — Telehealth: Payer: Self-pay | Admitting: Oncology

## 2013-10-25 NOTE — Telephone Encounter (Signed)
Per 10/24 POF made appt 11/21 1:15 w KK LVMM adv pt of same and cal mailed shh

## 2013-10-25 NOTE — Telephone Encounter (Signed)
Left vm for pt to return call to discuss [facility] from 10/20/13.

## 2013-10-26 ENCOUNTER — Telehealth: Payer: Self-pay | Admitting: *Deleted

## 2013-10-26 ENCOUNTER — Encounter (HOSPITAL_BASED_OUTPATIENT_CLINIC_OR_DEPARTMENT_OTHER): Payer: Self-pay | Admitting: *Deleted

## 2013-10-26 NOTE — Progress Notes (Signed)
No more labs needed- 

## 2013-10-26 NOTE — Telephone Encounter (Signed)
Spoke to pt concerning [facility].  Pt denies questions or concerns regarding dx or treatment care plan.  Encourage pt to call with needs.  Received verbal understanding.  Contact information given.

## 2013-10-29 ENCOUNTER — Encounter (HOSPITAL_BASED_OUTPATIENT_CLINIC_OR_DEPARTMENT_OTHER)
Admission: RE | Disposition: A | Discharge status: Home / Self Care | Payer: Self-pay | Source: Ambulatory Visit | Attending: General Surgery

## 2013-10-29 ENCOUNTER — Ambulatory Visit (HOSPITAL_BASED_OUTPATIENT_CLINIC_OR_DEPARTMENT_OTHER): Payer: 59 | Admitting: Anesthesiology

## 2013-10-29 ENCOUNTER — Ambulatory Visit (HOSPITAL_BASED_OUTPATIENT_CLINIC_OR_DEPARTMENT_OTHER)
Admission: RE | Admit: 2013-10-29 | Discharge: 2013-10-29 | Disposition: A | Discharge status: Home / Self Care | Payer: 59 | Source: Ambulatory Visit | Attending: General Surgery | Admitting: General Surgery

## 2013-10-29 ENCOUNTER — Encounter (HOSPITAL_COMMUNITY)
Admission: RE | Admit: 2013-10-29 | Discharge: 2013-10-29 | Disposition: A | Discharge status: Home / Self Care | Payer: 59 | Source: Ambulatory Visit | Attending: General Surgery | Admitting: General Surgery

## 2013-10-29 ENCOUNTER — Encounter (HOSPITAL_BASED_OUTPATIENT_CLINIC_OR_DEPARTMENT_OTHER): Payer: 59 | Admitting: Anesthesiology

## 2013-10-29 ENCOUNTER — Ambulatory Visit
Admission: RE | Admit: 2013-10-29 | Discharge: 2013-10-29 | Disposition: A | Discharge status: Home / Self Care | Payer: 59 | Source: Ambulatory Visit | Attending: General Surgery | Admitting: General Surgery

## 2013-10-29 ENCOUNTER — Encounter (HOSPITAL_BASED_OUTPATIENT_CLINIC_OR_DEPARTMENT_OTHER): Payer: Self-pay | Admitting: *Deleted

## 2013-10-29 DIAGNOSIS — C50919 Malignant neoplasm of unspecified site of unspecified female breast: Secondary | ICD-10-CM

## 2013-10-29 DIAGNOSIS — Z170 Estrogen receptor positive status [ER+]: Secondary | ICD-10-CM | POA: Insufficient documentation

## 2013-10-29 DIAGNOSIS — C50212 Malignant neoplasm of upper-inner quadrant of left female breast: Secondary | ICD-10-CM

## 2013-10-29 DIAGNOSIS — C50219 Malignant neoplasm of upper-inner quadrant of unspecified female breast: Secondary | ICD-10-CM | POA: Insufficient documentation

## 2013-10-29 DIAGNOSIS — C773 Secondary and unspecified malignant neoplasm of axilla and upper limb lymph nodes: Secondary | ICD-10-CM | POA: Insufficient documentation

## 2013-10-29 DIAGNOSIS — Z79899 Other long term (current) drug therapy: Secondary | ICD-10-CM | POA: Insufficient documentation

## 2013-10-29 HISTORY — PX: BREAST LUMPECTOMY WITH NEEDLE LOCALIZATION AND AXILLARY SENTINEL LYMPH NODE BX: SHX5760

## 2013-10-29 HISTORY — DX: Anxiety disorder, unspecified: F41.9

## 2013-10-29 HISTORY — DX: Depression, unspecified: F32.A

## 2013-10-29 HISTORY — DX: Encounter for fitting and adjustment of spectacles and contact lenses: Z46.0

## 2013-10-29 HISTORY — DX: Major depressive disorder, single episode, unspecified: F32.9

## 2013-10-29 SURGERY — BREAST LUMPECTOMY WITH NEEDLE LOCALIZATION AND AXILLARY SENTINEL LYMPH NODE BX
Anesthesia: General | Site: Breast | Laterality: Left | Wound class: Clean

## 2013-10-29 MED ORDER — METHYLENE BLUE 1 % INJ SOLN
INTRAMUSCULAR | Status: AC
  Filled 2013-10-29: qty 10

## 2013-10-29 MED ORDER — ONDANSETRON HCL 4 MG/2ML IJ SOLN
INTRAMUSCULAR | Status: DC | PRN
  Administered 2013-10-29: 4 mg via INTRAVENOUS

## 2013-10-29 MED ORDER — BUPIVACAINE HCL 0.25 % IJ SOLN
INTRAMUSCULAR | Status: DC | PRN
  Administered 2013-10-29: 19 mL

## 2013-10-29 MED ORDER — MIDAZOLAM HCL 2 MG/2ML IJ SOLN
1.0000 mg | INTRAMUSCULAR | Status: DC | PRN
  Administered 2013-10-29: 2 mg via INTRAVENOUS

## 2013-10-29 MED ORDER — LIDOCAINE HCL (CARDIAC) 20 MG/ML IV SOLN
INTRAVENOUS | Status: DC | PRN
  Administered 2013-10-29: 100 mg via INTRAVENOUS

## 2013-10-29 MED ORDER — FENTANYL CITRATE 0.05 MG/ML IJ SOLN
INTRAMUSCULAR | Status: AC
  Filled 2013-10-29: qty 4

## 2013-10-29 MED ORDER — PROPOFOL 10 MG/ML IV BOLUS
INTRAVENOUS | Status: DC | PRN
  Administered 2013-10-29: 200 mg via INTRAVENOUS

## 2013-10-29 MED ORDER — MIDAZOLAM HCL 2 MG/2ML IJ SOLN
INTRAMUSCULAR | Status: AC
  Filled 2013-10-29: qty 2

## 2013-10-29 MED ORDER — OXYCODONE HCL 5 MG/5ML PO SOLN: 5.0000 mg | Freq: Once | ORAL | Status: AC | PRN

## 2013-10-29 MED ORDER — FENTANYL CITRATE 0.05 MG/ML IJ SOLN
INTRAMUSCULAR | Status: AC
  Filled 2013-10-29: qty 2

## 2013-10-29 MED ORDER — FENTANYL CITRATE 0.05 MG/ML IJ SOLN
INTRAMUSCULAR | Status: DC | PRN
  Administered 2013-10-29: 100 ug via INTRAVENOUS

## 2013-10-29 MED ORDER — MIDAZOLAM HCL 5 MG/5ML IJ SOLN
INTRAMUSCULAR | Status: DC | PRN
  Administered 2013-10-29: 1 mg via INTRAVENOUS

## 2013-10-29 MED ORDER — HYDROMORPHONE HCL PF 1 MG/ML IJ SOLN
0.2500 mg | INTRAMUSCULAR | Status: DC | PRN
  Administered 2013-10-29 (×2): 0.5 mg via INTRAVENOUS

## 2013-10-29 MED ORDER — OXYCODONE HCL 5 MG PO TABS
ORAL_TABLET | ORAL | Status: AC
  Filled 2013-10-29: qty 1

## 2013-10-29 MED ORDER — SODIUM CHLORIDE 0.9 % IJ SOLN
INTRAMUSCULAR | Status: AC
  Filled 2013-10-29: qty 10

## 2013-10-29 MED ORDER — FENTANYL CITRATE 0.05 MG/ML IJ SOLN
50.0000 ug | INTRAMUSCULAR | Status: DC | PRN
  Administered 2013-10-29: 100 ug via INTRAVENOUS

## 2013-10-29 MED ORDER — OXYCODONE-ACETAMINOPHEN 10-325 MG PO TABS: 1.0000 | ORAL_TABLET | Freq: Four times a day (QID) | ORAL | Status: DC | PRN

## 2013-10-29 MED ORDER — EPHEDRINE SULFATE 50 MG/ML IJ SOLN
INTRAMUSCULAR | Status: DC | PRN
  Administered 2013-10-29: 10 mg via INTRAVENOUS

## 2013-10-29 MED ORDER — CEFAZOLIN SODIUM-DEXTROSE 2-3 GM-% IV SOLR
2.0000 g | INTRAVENOUS | Status: AC
  Administered 2013-10-29: 2 g via INTRAVENOUS

## 2013-10-29 MED ORDER — TECHNETIUM TC 99M SULFUR COLLOID FILTERED
1.0000 | Freq: Once | INTRAVENOUS | Status: AC | PRN
  Administered 2013-10-29: 1 via INTRADERMAL

## 2013-10-29 MED ORDER — DEXAMETHASONE SODIUM PHOSPHATE 4 MG/ML IJ SOLN
INTRAMUSCULAR | Status: DC | PRN
  Administered 2013-10-29: 10 mg via INTRAVENOUS

## 2013-10-29 MED ORDER — PROMETHAZINE HCL 25 MG/ML IJ SOLN: 6.2500 mg | INTRAMUSCULAR | Status: DC | PRN

## 2013-10-29 MED ORDER — LACTATED RINGERS IV SOLN
INTRAVENOUS | Status: DC
  Administered 2013-10-29 (×2): via INTRAVENOUS

## 2013-10-29 MED ORDER — OXYCODONE HCL 5 MG PO TABS
5.0000 mg | ORAL_TABLET | Freq: Once | ORAL | Status: AC | PRN
  Administered 2013-10-29: 5 mg via ORAL

## 2013-10-29 MED ORDER — BUPIVACAINE-EPINEPHRINE PF 0.25-1:200000 % IJ SOLN
INTRAMUSCULAR | Status: AC
  Filled 2013-10-29: qty 30

## 2013-10-29 MED ORDER — HYDROMORPHONE HCL PF 1 MG/ML IJ SOLN
INTRAMUSCULAR | Status: AC
  Filled 2013-10-29: qty 1

## 2013-10-29 MED ORDER — CEFAZOLIN SODIUM-DEXTROSE 2-3 GM-% IV SOLR
INTRAVENOUS | Status: AC
  Filled 2013-10-29: qty 50

## 2013-10-29 SURGICAL SUPPLY — 61 items
APPLIER CLIP 9.375 MED OPEN (MISCELLANEOUS) ×2
BENZOIN TINCTURE PRP APPL 2/3 (GAUZE/BANDAGES/DRESSINGS) ×2 IMPLANT
BINDER BREAST LRG (GAUZE/BANDAGES/DRESSINGS) IMPLANT
BINDER BREAST MEDIUM (GAUZE/BANDAGES/DRESSINGS) IMPLANT
BINDER BREAST XLRG (GAUZE/BANDAGES/DRESSINGS) IMPLANT
BINDER BREAST XXLRG (GAUZE/BANDAGES/DRESSINGS) ×2 IMPLANT
BLADE SURG 15 STRL LF DISP TIS (BLADE) ×1 IMPLANT
BLADE SURG 15 STRL SS (BLADE) ×1
BNDG COHESIVE 4X5 TAN STRL (GAUZE/BANDAGES/DRESSINGS) IMPLANT
CANISTER SUCT 1200ML W/VALVE (MISCELLANEOUS) ×2 IMPLANT
CHLORAPREP W/TINT 26ML (MISCELLANEOUS) ×2 IMPLANT
CLIP APPLIE 9.375 MED OPEN (MISCELLANEOUS) ×1 IMPLANT
COVER MAYO STAND STRL (DRAPES) ×2 IMPLANT
COVER PROBE W GEL 5X96 (DRAPES) ×2 IMPLANT
COVER TABLE BACK 60X90 (DRAPES) ×2 IMPLANT
DECANTER SPIKE VIAL GLASS SM (MISCELLANEOUS) IMPLANT
DERMABOND ADVANCED (GAUZE/BANDAGES/DRESSINGS)
DERMABOND ADVANCED .7 DNX12 (GAUZE/BANDAGES/DRESSINGS) IMPLANT
DEVICE DUBIN W/COMP PLATE 8390 (MISCELLANEOUS) ×2 IMPLANT
DRAIN CHANNEL 19F RND (DRAIN) IMPLANT
DRAPE LAPAROSCOPIC ABDOMINAL (DRAPES) ×2 IMPLANT
DRAPE U-SHAPE 76X120 STRL (DRAPES) IMPLANT
DRSG TEGADERM 4X4.75 (GAUZE/BANDAGES/DRESSINGS) ×4 IMPLANT
ELECT COATED BLADE 2.86 ST (ELECTRODE) ×2 IMPLANT
ELECT REM PT RETURN 9FT ADLT (ELECTROSURGICAL) ×2
ELECTRODE REM PT RTRN 9FT ADLT (ELECTROSURGICAL) ×1 IMPLANT
EVACUATOR SILICONE 100CC (DRAIN) IMPLANT
GAUZE SPONGE 4X4 12PLY STRL LF (GAUZE/BANDAGES/DRESSINGS) IMPLANT
GLOVE BIO SURGEON STRL SZ7 (GLOVE) ×2 IMPLANT
GLOVE BIOGEL M 7.0 STRL (GLOVE) ×2 IMPLANT
GLOVE BIOGEL PI IND STRL 7.5 (GLOVE) ×1 IMPLANT
GLOVE BIOGEL PI INDICATOR 7.5 (GLOVE) ×1
GOWN PREVENTION PLUS XLARGE (GOWN DISPOSABLE) ×4 IMPLANT
KIT MARKER MARGIN INK (KITS) ×2 IMPLANT
NDL SAFETY ECLIPSE 18X1.5 (NEEDLE) IMPLANT
NEEDLE HYPO 18GX1.5 SHARP (NEEDLE)
NEEDLE HYPO 25X1 1.5 SAFETY (NEEDLE) ×2 IMPLANT
NS IRRIG 1000ML POUR BTL (IV SOLUTION) ×2 IMPLANT
PACK BASIN DAY SURGERY FS (CUSTOM PROCEDURE TRAY) ×2 IMPLANT
PENCIL BUTTON HOLSTER BLD 10FT (ELECTRODE) ×2 IMPLANT
PIN SAFETY STERILE (MISCELLANEOUS) IMPLANT
SLEEVE SCD COMPRESS KNEE MED (MISCELLANEOUS) ×2 IMPLANT
SPONGE LAP 18X18 X RAY DECT (DISPOSABLE) IMPLANT
SPONGE LAP 4X18 X RAY DECT (DISPOSABLE) ×2 IMPLANT
STAPLER VISISTAT 35W (STAPLE) ×2 IMPLANT
STOCKINETTE IMPERVIOUS LG (DRAPES) IMPLANT
STRIP CLOSURE SKIN 1/2X4 (GAUZE/BANDAGES/DRESSINGS) IMPLANT
SUT MNCRL AB 4-0 PS2 18 (SUTURE) ×4 IMPLANT
SUT MON AB 5-0 PS2 18 (SUTURE) IMPLANT
SUT SILK 2 0 SH (SUTURE) IMPLANT
SUT VIC AB 2-0 SH 27 (SUTURE) ×1
SUT VIC AB 2-0 SH 27XBRD (SUTURE) ×1 IMPLANT
SUT VIC AB 3-0 SH 27 (SUTURE) ×1
SUT VIC AB 3-0 SH 27X BRD (SUTURE) ×1 IMPLANT
SUT VIC AB 5-0 PS2 18 (SUTURE) IMPLANT
SUT VICRYL AB 3 0 TIES (SUTURE) IMPLANT
SYR CONTROL 10ML LL (SYRINGE) ×2 IMPLANT
TOWEL OR 17X24 6PK STRL BLUE (TOWEL DISPOSABLE) ×2 IMPLANT
TOWEL OR NON WOVEN STRL DISP B (DISPOSABLE) ×2 IMPLANT
TUBE CONNECTING 20X1/4 (TUBING) ×2 IMPLANT
YANKAUER SUCT BULB TIP NO VENT (SUCTIONS) ×2 IMPLANT

## 2013-10-29 NOTE — Anesthesia Preprocedure Evaluation (Addendum)
Anesthesia Evaluation  Patient identified by MRN, date of birth, ID band Patient awake    Reviewed: Allergy & Precautions, H&P , NPO status , Patient's Chart, lab work & pertinent test results  Airway Mallampati: II TM Distance: >3 FB Neck ROM: Full    Dental  (+) Dental Advisory Given and Teeth Intact   Pulmonary neg pulmonary ROS,  breath sounds clear to auscultation  Pulmonary exam normal       Cardiovascular negative cardio ROS      Neuro/Psych PSYCHIATRIC DISORDERS negative neurological ROS  negative psych ROS   GI/Hepatic negative GI ROS, Neg liver ROS,   Endo/Other  negative endocrine ROS  Renal/GU negative Renal ROS     Musculoskeletal   Abdominal   Peds  Hematology   Anesthesia Other Findings   Reproductive/Obstetrics negative OB ROS                         Anesthesia Physical Anesthesia Plan  ASA: II  Anesthesia Plan: General LMA   Post-op Pain Management:    Induction: Intravenous  Airway Management Planned: LMA  Additional Equipment:   Intra-op Plan:   Post-operative Plan: Extubation in OR  Informed Consent: I have reviewed the patients History and Physical, chart, labs and discussed the procedure including the risks, benefits and alternatives for the proposed anesthesia with the patient or authorized representative who has indicated his/her understanding and acceptance.   Dental advisory given  Plan Discussed with: Anesthesiologist, CRNA and Surgeon  Anesthesia Plan Comments:       Anesthesia Quick Evaluation

## 2013-10-29 NOTE — Transfer of Care (Signed)
Immediate Anesthesia Transfer of Care Note  Patient: Veronica Rogers  Procedure(s) Performed: Procedure(s): BREAST LUMPECTOMY WITH NEEDLE LOCALIZATION AND AXILLARY SENTINEL LYMPH NODE BX (Left)  Patient Location: PACU  Anesthesia Type:General  Level of Consciousness: awake, alert  and oriented  Airway & Oxygen Therapy: Patient Spontanous Breathing and Patient connected to face mask oxygen  Post-op Assessment: Report given to PACU RN, Post -op Vital signs reviewed and stable and Patient moving all extremities  Post vital signs: Reviewed and stable  Complications: No apparent anesthesia complications

## 2013-10-29 NOTE — H&P (View-Only) (Signed)
Patient ID: Veronica Rogers, female   DOB: 09/17/1961, 52 y.o.   MRN: [id number]  Chief Complaint  Patient presents with  . Other    HPI Veronica Rogers is a 52 y.o. female.  Referred by Dr Robert Wein HPI 52 yof who has no complaints referable to either breast who presents after undergoing screening mm (tomo) that showed a left breast mass.  By us this is 9 mm in size.  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  She is breast density category b.  She has no family history of breast cancer. She works at [employer]. She comes in today to discuss her options.   Past Medical History  Diagnosis Date  . Breast cancer     Past Surgical History  Procedure Laterality Date  . Breast biopsy  1996    right breast, benign    Family History  Problem Relation Age of Onset  . Squamous cell carcinoma Father   . Cancer Father   . Diabetes Father   . Hypertension Mother   . Diabetes Sister     Social History History  Substance Use Topics  . Smoking status: Never Smoker   . Smokeless tobacco: Never Used  . Alcohol Use: Yes     Comment: 1-2 drinks a month    Allergies  Allergen Reactions  . Erythromycin Other (See Comments)    Stomach pain    Current Outpatient Prescriptions  Medication Sig Dispense Refill  . meloxicam (MOBIC) 15 MG tablet Take 1 tablet (15 mg total) by mouth daily.  30 tablet  1  . norethindrone-ethinyl estradiol (JUNEL FE 1/20) 1-20 MG-MCG per tablet Take 1 tablet by mouth daily.        . sertraline (ZOLOFT) 50 MG tablet Take 25 mg by mouth daily.        Current Facility-Administered Medications  Medication Dose Route Frequency Provider Last Rate Last Dose  . 0.9 %  sodium chloride infusion  500 mL Intravenous Continuous Daniel P Jacobs, MD        Review of Systems Review of Systems  Constitutional: Negative for fever, chills and unexpected weight change.  HENT:  Negative for congestion, hearing loss, sore throat, trouble swallowing and voice change.   Eyes: Negative for visual disturbance.  Respiratory: Negative for cough and wheezing.   Cardiovascular: Negative for chest pain, palpitations and leg swelling.  Gastrointestinal: Negative for nausea, vomiting, abdominal pain, diarrhea, constipation, blood in stool, abdominal distention and anal bleeding.  Genitourinary: Negative for hematuria, vaginal bleeding and difficulty urinating.  Musculoskeletal: Positive for arthralgias.  Skin: Negative for rash and wound.  Neurological: Negative for seizures, syncope and headaches.  Hematological: Negative for adenopathy. Does not bruise/bleed easily.  Psychiatric/Behavioral: Negative for confusion.    Last menstrual period 09/15/2013.  Physical Exam Physical Exam  Vitals reviewed. Constitutional: She appears well-developed and well-nourished.  Eyes: No scleral icterus.  Neck: Neck supple.  Cardiovascular: Normal rate, regular rhythm and normal heart sounds.   Pulmonary/Chest: Effort normal and breath sounds normal. She has no wheezes. She has no rales. Right breast exhibits no inverted nipple, no mass, no nipple discharge, no skin change and no tenderness. Left breast exhibits no inverted nipple, no mass, no nipple discharge, no skin change and no tenderness.  Abdominal: Soft. There is no tenderness.  Lymphadenopathy:    She has no cervical adenopathy.      She has no axillary adenopathy.       Right: No supraclavicular adenopathy present.       Left: No supraclavicular adenopathy present.    Data Reviewed EXAM:  DIGITAL DIAGNOSTIC UNILATERAL LEFT MAMMOGRAM LIMITED; ULTRASOUND  LEFT BREAST  COMPARISON: 09/01/2012, 09/27/2011, 07/19/2010, 05/26/2009,  04/01/2008 and 02/05/2007  ACR Breast Density Category b: There are scattered areas of  fibroglandular density.  FINDINGS:  Spot compression views demonstrate a spiculated 1 cm mass over the   slightly inner upper left breast.  Ultrasound is performed, showing an irregular bordered hypoechoic  solid mass with minimal shadowing at the 11:30 position of the left  breast 8 cm from the nipple measuring 8 x 9 x 9 mm. Ultrasound of  the left axilla demonstrates no abnormal appearing lymph nodes.  IMPRESSION:  Suspicious 8 x 9 x 9 mm irregular bordered hypoechoic mass at the  11:30 position of the left breast 8 cm from the nipple.  RECOMMENDATION:  Recommend ultrasound-guided core needle biopsy of the suspicious  mass.  I have discussed the findings and recommendations with the patient.  Results were also provided in writing at the conclusion of the  visit. If applicable, a reminder letter will be sent to the patient  regarding the next appointment.  BI-RADS CATEGORY 5: Highly suggestive of malignancy - appropriate  action should be taken.    Assessment    Clinical stage I left breast cancer    Plan    Left breast wire guided lumpectomy, left axillary sentinel node biopsy  We discussed indications for breast mri and decided not to pursue this.  She does not have dense breast tissue and this was easily found on mm.   We discussed the staging and pathophysiology of breast cancer. We discussed all of the different options for treatment for breast cancer including surgery, chemotherapy, radiation therapy, Herceptin, and antiestrogen therapy.   We discussed a sentinel lymph node biopsy as she does not appear to having lymph node involvement right now. We discussed the performance of that with injection of radioactive tracer and possibly blue dye. We discussed that she would have an incision underneath her axillary hairline. We discussed that there is a small chance of having a positive node with a sentinel lymph node biopsy and we will await the permanent pathology to make any other first further decisions in terms of her treatment. One of these options might be to return to the  operating room to perform an axillary lymph node dissection. We discussed about a 1-2% risk lifetime of chronic shoulder pain as well as lymphedema associated with a sentinel lymph node biopsy.  We discussed the options for treatment of the breast cancer which included lumpectomy versus a mastectomy. We discussed the performance of the lumpectomy with a wire placement. We discussed a 5% chance of a positive margin requiring reexcision in the operating room. We also discussed that she may need radiation therapy or antiestrogen therapy or both if she undergoes lumpectomy. We discussed the mastectomy and the postoperative care for that as well. We discussed that there is no difference in her survival whether she undergoes lumpectomy with radiation therapy or antiestrogen therapy versus a mastectomy. There is a slight difference in the local recurrence rate being 3-5% with lumpectomy and about 1% with a mastectomy. We discussed the risks of operation including bleeding, infection, possible reoperation. She understands her further therapy will be based on what her stages at the time of her operation.            Ilyse Tremain 10/20/2013, 2:33 PM    

## 2013-10-29 NOTE — Op Note (Signed)
Preoperative diagnosis: Clinical stage I left breast cancer Postoperative diagnosis: same as above Procedure:  1. Left breast wire guided partial mastectomy 2. Left axillary sentinel node biopsy Surgeon: Dr Harden Mo Anesthesia: general EBL: minimal Drains: none Specimens: 1. Left breast tissue marked with paint 2. Additional anterior margin marked short superior, long lateral, double anterior 3. Left axillary sentinel node times four with counts from 189-989 4. Left axillary nonsentinel node Complications: none Sponge and needle count correct at the end.  Disposition to recovery stable  Indications: This is a 52 year old female who had a screening mammogram that showed a small left breast mass. She had an ultrasound that showed a less than 1 cm left breast mass and her axillary lymph nodes all appeared normal. She was seen in the breast multidisciplinary clinic and we decided on performing a lumpectomy with a sentinel lymph node biopsy. We discussed the risks and benefits prior to beginning.  Procedure: After informed consent was obtained the patient was taken to the operating room. She first had a wire placed at the breast center. She had technetium administered in the standard periareolar fashion. She was then placed under general anesthesia without complication. Her left breast and axilla were then prepped and draped in the standard sterile surgical fashion. A surgical timeout was then performed.  I made a curvilinear incision over where the tumor was. I then brought the wire in from its remote position. I then used cautery to excise the wire and the surrounding tissue with an attempt to get a clear margin. This was then removed and painted. I then did a specimen mammogram confirmed that the lesion and clip were both removed. I thought the anterior margin might have been a little bit close so I excised additional anterior margin. This anterior margin is now her skin. This was marked  with as above. I had pathology look at the specimen grossly and they thought the anterior margin was close which should be cleared by my reexcision. I then placed 2 clips deep and one clip around each area in the cavity. I then closed the breast tissue a 2-0 Vicryl. The dermis was closed with 3-0 Vicryl and the skin with 4-0 Monocryl. I placed Dermabond and Steri-Strips on this. This area was infiltrated with Marcaine.  I then made a 2.5 cm incision below her axillary hairline. I carried this through the axillary fascia. I then identified 4 sentinel lymph nodes with counts as listed above. There was a non-sentinel lymph node that also excised. There was no background radioactivity really. I then obtained hemostasis. I closed the axillary fascia with 2-0 Vicryl. I closed the dermis a 3-0 Vicryl and skin with 4 Monocryl. Steri-Strips and a sterile dressing were placed. She tolerated this well. A breast binder was placed. She was then extubated and transferred to recovery stable.

## 2013-10-29 NOTE — Anesthesia Postprocedure Evaluation (Signed)
Anesthesia Post Note  Patient: Veronica Rogers  Procedure(s) Performed: Procedure(s) (LRB): BREAST LUMPECTOMY WITH NEEDLE LOCALIZATION AND AXILLARY SENTINEL LYMPH NODE BX (Left)  Anesthesia type: general  Patient location: PACU  Post pain: Pain level controlled  Post assessment: Patient's Cardiovascular Status Stable  Last Vitals:  Filed Vitals:   10/29/13 1305  BP:   Pulse: 90  Temp: 36.8 C  Resp: 18    Post vital signs: Reviewed and stable  Level of consciousness: sedated  Complications: No apparent anesthesia complications

## 2013-10-29 NOTE — Anesthesia Procedure Notes (Signed)
Procedure Name: LMA Insertion Date/Time: 10/29/2013 10:47 AM Performed by: Dwain Sarna, MATTHEW Pre-anesthesia Checklist: Patient identified, Emergency Drugs available, Suction available and Patient being monitored Patient Re-evaluated:Patient Re-evaluated prior to inductionOxygen Delivery Method: Circle System Utilized Preoxygenation: Pre-oxygenation with 100% oxygen Intubation Type: IV induction Ventilation: Mask ventilation without difficulty LMA: LMA inserted LMA Size: 4.0 Number of attempts: 1 Airway Equipment and Method: bite block Placement Confirmation: positive ETCO2 and breath sounds checked- equal and bilateral Tube secured with: Tape Dental Injury: Teeth and Oropharynx as per pre-operative assessment

## 2013-10-29 NOTE — Interval H&P Note (Signed)
History and Physical Interval Note:  10/29/2013 10:01 AM  Veronica Rogers  has presented today for surgery, with the diagnosis of left breast cancer  The various methods of treatment have been discussed with the patient and family. After consideration of risks, benefits and other options for treatment, the patient has consented to  Procedure(s): BREAST LUMPECTOMY WITH NEEDLE LOCALIZATION AND AXILLARY SENTINEL LYMPH NODE BX (Left) as a surgical intervention .  The patient's history has been reviewed, patient examined, no change in status, stable for surgery.  I have reviewed the patient's chart and labs.  Questions were answered to the patient's satisfaction.     Deanglo Hissong

## 2013-11-01 ENCOUNTER — Telehealth (INDEPENDENT_AMBULATORY_CARE_PROVIDER_SITE_OTHER): Payer: Self-pay

## 2013-11-01 ENCOUNTER — Encounter (HOSPITAL_BASED_OUTPATIENT_CLINIC_OR_DEPARTMENT_OTHER): Payer: Self-pay | Admitting: General Surgery

## 2013-11-01 NOTE — Telephone Encounter (Signed)
LMOM giving pt her f/u appt with Dr Dwain Sarna for 11/09/13 arrive at 3:00/3:15.

## 2013-11-03 ENCOUNTER — Other Ambulatory Visit (INDEPENDENT_AMBULATORY_CARE_PROVIDER_SITE_OTHER): Payer: Self-pay | Admitting: General Surgery

## 2013-11-03 ENCOUNTER — Telehealth (INDEPENDENT_AMBULATORY_CARE_PROVIDER_SITE_OTHER): Payer: Self-pay

## 2013-11-03 ENCOUNTER — Other Ambulatory Visit (HOSPITAL_COMMUNITY): Payer: 59

## 2013-11-03 NOTE — Telephone Encounter (Signed)
Pt calling for her path report you can reach her on her cell all day at [phone]. The pt is going back to work tomorrow so she will be hard to reach on the phone tomorrow if Dr Dwain Sarna calls with the report.

## 2013-11-03 NOTE — Telephone Encounter (Signed)
I have it.  Will call her between cases today.

## 2013-11-03 NOTE — Telephone Encounter (Signed)
No notes in encounter. 

## 2013-11-03 NOTE — Telephone Encounter (Signed)
Pt calling again to speak with Dr. Dwain Sarna.  Explained that he is aware of her pathology results and will call her today in between cases at the hospital.

## 2013-11-04 ENCOUNTER — Telehealth (INDEPENDENT_AMBULATORY_CARE_PROVIDER_SITE_OTHER): Payer: Self-pay

## 2013-11-04 NOTE — Telephone Encounter (Signed)
Called pt at work. I asked the pt if she would like an appt tomorrow to come see  Dr Dwain Sarna to discuss her path report some more and the pt said she was ok for now. The pt said she had a long conversation last night and she feels ok today. The pt will see [place] at her scheduled office visit. The pt is trying to make up time at work.

## 2013-11-04 NOTE — Telephone Encounter (Signed)
LMOM for pt to call and talk to me today. I requested pt to have me overhead paged b/c i know with her working today it's hard for her to be on the phone. I want to see if pt would like to come in tomorrow to see Dr Dwain Sarna at 8am to discuss her path report more with him face to face.

## 2013-11-04 NOTE — Telephone Encounter (Signed)
LMOM for pt notifying her that Dr Dwain Sarna wound not be able to call her tonight b/c he was going to be in surgery.  I advised pt that Dr Dwain Sarna did speak to Dr Welton Flakes today about the pathology report and they both agree that we need to wait till the [place] Type comes back. If the pt has any questions to call me in the am since I will be working with Dr Dwain Sarna.

## 2013-11-08 ENCOUNTER — Encounter: Payer: Self-pay | Admitting: *Deleted

## 2013-11-08 NOTE — Progress Notes (Signed)
Oncotype order received by Dr. Khan.  Sent to pathology.   

## 2013-11-09 ENCOUNTER — Encounter (INDEPENDENT_AMBULATORY_CARE_PROVIDER_SITE_OTHER): Payer: Self-pay | Admitting: General Surgery

## 2013-11-09 ENCOUNTER — Ambulatory Visit (INDEPENDENT_AMBULATORY_CARE_PROVIDER_SITE_OTHER): Payer: Commercial Managed Care - PPO | Admitting: General Surgery

## 2013-11-09 VITALS — BP 150/78 | HR 96 | Temp 97.6°F | Resp 14 | Ht 68.0 in | Wt 196.4 lb

## 2013-11-09 DIAGNOSIS — Z09 Encounter for follow-up examination after completed treatment for conditions other than malignant neoplasm: Secondary | ICD-10-CM

## 2013-11-10 NOTE — Progress Notes (Signed)
Subjective:     Patient ID: Veronica Rogers, female   DOB: Dec 15, 1961, 52 y.o.   MRN: [id number]  HPI 52 yof who underwent left breast lumpectomy and sentinel node biopsy recently for clinical stage I breast cancer. She has done well and returns today without complaints.  I have discussed path by phone and we discussed in detail today as well.  Her margins are negative but she has a node that has a 1 mm deposit of metastatic tumors.   Review of Systems 1. Breast, lumpectomy, Left - INVASIVE DUCTAL CARCINOMA, SEE COMMENT. - LYMPHOVASCULAR INVASION IDENTIFIED. - INVASIVE TUMOR IS 1 MM FROM NEAREST MARGIN (MEDIAL). - PREVIOUS BIOPSY SITE IDENTIFIED. - SEE TUMOR TEMPLATE BELOW. 2. Breast, excision, left - BENIGN BREAST TISSUE, SEE COMMENT. - NEGATIVE FOR ATYPIA OR MALIGNANCY. - SURGICAL MARGINS, NEGATIVE FOR ATYPIA OR MALIGNANCY. 3. Lymph node, sentinel, biopsy, #1 left Axilla - ONE LYMPH NODE, POSITIVE FOR METASTATIC MAMMARY CARCINOMA (1/1). 1 of 4 FINAL for Veronica Rogers, Veronica Rogers [phone]) Diagnosis(continued) - TUMOR DEPOSIT IS 1 MM. - NO EXTRACAPSULAR TUMOR EXTENSION IDENTIFIED. 4. Lymph nodes, regional resection, left Axilla - THREE LYMPH NODES, NEGATIVE FOR TUMOR (0/3). 5. Lymph node, sentinel, biopsy, #2 left - ONE LYMPH NODE, NEGATIVE FOR TUMOR (0/1). 6. Lymph node, sentinel, biopsy, #3 left - ONE LYMPH NODE, NEGATIVE FOR TUMOR (0/1). 7. Lymph node, sentinel, biopsy, #4 left - ONE LYMPH NODE, NEGATIVE FOR TUMOR (0/1).    Objective:   Physical Exam Healing left breast incision and left axillary incision without infection    Assessment:     Stage 2 breast cancer s/p lump/sn     Plan:     I have discussed case with Dr Welton Flakes and she would like to send oncotype.  I conveyed this to the patient today as well.  She can begin increasing activity.  Gave her information on ABC class also.  I will see back in 6 months.  Will setup appts at cancer center and will await oncotype to  determine adjuvant therapy.

## 2013-11-11 ENCOUNTER — Ambulatory Visit: Payer: 59

## 2013-11-11 ENCOUNTER — Ambulatory Visit: Payer: 59 | Admitting: Radiation Oncology

## 2013-11-15 ENCOUNTER — Encounter: Payer: Self-pay | Admitting: *Deleted

## 2013-11-15 ENCOUNTER — Telehealth (INDEPENDENT_AMBULATORY_CARE_PROVIDER_SITE_OTHER): Payer: Self-pay

## 2013-11-15 NOTE — Telephone Encounter (Signed)
Pt is s/p left breast lumpectomy with NL on 10/29/13 by Dr. Dwain Sarna.  She calls today c/o excessive bruising at the lumpectomy site.  She denies redness of the breast, warmth to touch, pain or drainage from wound site.  I explained that post op lumpectomy can cause bruising and sometimes a collection of old blood in the cavity where the mass was removed.  Patient was advised to call if any changes to her signs and symptoms, or if she has any concerns.  Dr. Dwain Sarna to be made aware per pt's request.

## 2013-11-15 NOTE — Progress Notes (Signed)
Mailed after appt letter to pt. 

## 2013-11-16 ENCOUNTER — Telehealth: Payer: Self-pay | Admitting: *Deleted

## 2013-11-16 NOTE — Telephone Encounter (Signed)
Left vm for pt to return call to discuss schedule change with Dr. Welton Flakes to 11/22/13 at 4:00pm

## 2013-11-18 ENCOUNTER — Encounter: Payer: Self-pay | Admitting: *Deleted

## 2013-11-18 ENCOUNTER — Encounter (HOSPITAL_COMMUNITY): Payer: Self-pay

## 2013-11-18 NOTE — Progress Notes (Signed)
Received Oncotype Dx results of 29.  Gave copy to MD.  Rochele Pages copy to Med Rec to scan.

## 2013-11-19 ENCOUNTER — Ambulatory Visit: Payer: 59 | Admitting: Oncology

## 2013-11-22 ENCOUNTER — Ambulatory Visit (HOSPITAL_BASED_OUTPATIENT_CLINIC_OR_DEPARTMENT_OTHER): Payer: 59 | Admitting: Oncology

## 2013-11-22 VITALS — BP 147/80 | HR 88 | Temp 98.5°F | Resp 18 | Ht 68.0 in | Wt 199.7 lb

## 2013-11-22 DIAGNOSIS — C50219 Malignant neoplasm of upper-inner quadrant of unspecified female breast: Secondary | ICD-10-CM

## 2013-11-22 DIAGNOSIS — Z170 Estrogen receptor positive status [ER+]: Secondary | ICD-10-CM

## 2013-11-25 ENCOUNTER — Ambulatory Visit: Payer: 59 | Admitting: Oncology

## 2013-11-29 ENCOUNTER — Telehealth (INDEPENDENT_AMBULATORY_CARE_PROVIDER_SITE_OTHER): Payer: Self-pay | Admitting: *Deleted

## 2013-11-29 ENCOUNTER — Other Ambulatory Visit (INDEPENDENT_AMBULATORY_CARE_PROVIDER_SITE_OTHER): Payer: Self-pay | Admitting: General Surgery

## 2013-11-29 NOTE — Telephone Encounter (Signed)
I sent Dr Welton Flakes a message back last week but Thanksgiving was there.  I told her I couldn't do in time for when she planned to start so she is going to have IR place this. I communicated with her about this today

## 2013-11-29 NOTE — Progress Notes (Signed)
Veronica Rogers [id number] [birth year] 52 y.o. 11/29/2013 1:10 AM  CC Veronica Osmond, MD [address] [place] [address]  diagnosis:  52 year old female with left breast cancer stage I. 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 Emelia Loron  Prior oncologic history:  Veronica Rogers is a 52 y.o. female.  Past medical history significant for anxiety.   #1Patient had a screening mammogram performed that revealed a left breast mass measuring 9 mm. By ultrasound it was 9 mm at the 11:30 o'clock position 8 cm from the nipple.  She did not have an MRI performed. 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 Patient's case was discussed at the multidisciplinary breast conference. She is now seen in the multidisciplinary breast clinic for discussion of treatment options. She was seen by Dr. Emelia Loron as well as Dr. Antony Blackbird.  #2 Patient is now status post lumpectomy performed on 10/29/2013. Her final pathology did reveal grade 2, 0.9 cm invasive ductal carcinoma that was ER positive PR positive. One sentinel node was positive for micrometastasis.  #3 Patient also had an Oncotype DX testing done and her recurrence score was 29 giving her a 17% risk of distant recurrence.  Current therapy: Patient was recommended Taxotere Cytoxan adjuvantly x4 cycles  Interval history:patient is seen in followup today to discuss her final pathology as well as Oncotype DX test results. Clinically she's doing well. She's healed very nicely from the surgery. She denies  any nausea vomiting fevers chills night sweats headaches shortness of breath chest pains palpitations no myalgias and arthralgias no peripheral paresthesias. Remainder of the 10 point review of systems is negative.   Past Medical History: Past Medical History  Diagnosis Date  . Breast cancer   . Anxiety   . Depression   . Contact lens/glasses fitting     wears contacts or glasses    Past Surgical History: Past Surgical History  Procedure Laterality Date  . Breast biopsy  1996    right breast, benign  . Wisdom tooth extraction    . Colonoscopy    . Breast lumpectomy with needle localization and axillary sentinel lymph node bx Left 10/29/2013    Procedure: BREAST LUMPECTOMY WITH NEEDLE LOCALIZATION AND AXILLARY SENTINEL LYMPH NODE BX;  Surgeon: Emelia Loron, MD;  Location: [facility];  Service: General;  Laterality: Left;    Family History: Family History  Problem Relation Age of Onset  . Squamous cell carcinoma Father   . Cancer Father   . Diabetes Father   . Hypertension Mother   . Diabetes Sister     Social History History  Substance Use Topics  . Smoking status: Never Smoker   . Smokeless tobacco: Never Used  . Alcohol Use: Yes     Comment: 1-2 drinks a month    Allergies: Allergies  Allergen Reactions  . Erythromycin Other (See Comments)    Stomach pain    Current Medications: Current Outpatient Prescriptions  Medication Sig Dispense Refill  . meloxicam (MOBIC) 15 MG tablet Take 1 tablet (15 mg total)  by mouth daily.  30 tablet  1  . sertraline (ZOLOFT) 50 MG tablet Take 25 mg by mouth daily.        No current facility-administered medications for this visit.    OB/GYN History:menarche at age 14 she is premenopausal last menstrual period was on 10/13/2013 she has been on birth control pills to the present she is at first live birth at age 24.  Fertility Discussion:patient has completed her family Prior History of Cancer:no prior  history  Health Maintenance:  Colonoscopy 2012 Bone Density no Last PAP smear September 2014  ECOG PERFORMANCE STATUS: 0 - Asymptomatic  Genetic Counseling/testing: no  REVIEW OF SYSTEMS:  A comprehensive review of systems was negative.  PHYSICAL EXAMINATION: Blood pressure 147/80, pulse 88, temperature 98.5 F (36.9 C), temperature source Oral, resp. rate 18, height 5\' 8"  (1.727 m), weight 199 lb 11.2 oz (90.583 kg).  ZOX:WRUEA, healthy, no distress, well nourished and well developed SKIN: skin color, texture, turgor are normal HEAD: Normocephalic EYES: PERRLA, EOMI, Conjunctiva are pink and non-injected EARS: External ears normal OROPHARYNX:no exudate, no erythema and lips, buccal mucosa, and tongue normal  NECK: no adenopathy, thyroid normal size, non-tender, without nodularity LYMPH:  no palpable lymphadenopathy, no hepatosplenomegaly BREAST:right breast normal without mass, skin or nipple changes or axillary nodes, left breast normal without mass, skin or nipple changes or axillary nodes LUNGS: clear to auscultation and percussion HEART: regular rate & rhythm ABDOMEN:abdomen soft, non-tender, normal bowel sounds and no masses or organomegaly BACK: No CVA tenderness EXTREMITIES:less then 2 second capillary refill, no edema, no clubbing, no cyanosis  NEURO: alert & oriented x 3 with fluent speech, no focal motor/sensory deficits, gait normal     STUDIES/RESULTS: US Breast Left  2013/10/16   CLINICAL DATA:  Patient presents for additional views of the left breast as followup to a recent screening exam suggesting a possible mass.  EXAM: DIGITAL DIAGNOSTIC UNILATERAL LEFT MAMMOGRAM LIMITED; ULTRASOUND LEFT BREAST  COMPARISON:  09/01/2012, 09/27/2011, 07/19/2010, 05/26/2009, 04/01/2008 and 02/05/2007  ACR Breast Density Category b: There are scattered areas of fibroglandular density.  FINDINGS: Spot compression views demonstrate a spiculated 1 cm mass over the slightly inner  upper left breast.  Ultrasound is performed, showing an irregular bordered hypoechoic solid mass with minimal shadowing at the 11:30 position of the left breast 8 cm from the nipple measuring 8 x 9 x 9 mm. Ultrasound of the left axilla demonstrates no abnormal appearing lymph nodes.  IMPRESSION: Suspicious 8 x 9 x 9 mm irregular bordered hypoechoic mass at the 11:30 position of the left breast 8 cm from the nipple.  RECOMMENDATION: Recommend ultrasound-guided core needle biopsy of the suspicious mass.  I have discussed the findings and recommendations with the patient. Results were also provided in writing at the conclusion of the visit. If applicable, a reminder letter will be sent to the patient regarding the next appointment.  BI-RADS CATEGORY  5: Highly suggestive of malignancy - appropriate action should be taken.  Biopsy will be done today.   Electronically Signed   By: Elberta Fortis M.D.   On: 10/16/13 16:33   Mm Digital Diag Ltd L  10/16/2013   CLINICAL DATA:  Patient presents for additional views of the left breast as followup to a recent screening exam suggesting a possible mass.  EXAM: DIGITAL DIAGNOSTIC UNILATERAL LEFT MAMMOGRAM LIMITED; ULTRASOUND LEFT BREAST  COMPARISON:  09/01/2012, 09/27/2011, 07/19/2010, 05/26/2009, 04/01/2008 and 02/05/2007  ACR Breast Density Category b: There are scattered areas  of fibroglandular density.  FINDINGS: Spot compression views demonstrate a spiculated 1 cm mass over the slightly inner upper left breast.  Ultrasound is performed, showing an irregular bordered hypoechoic solid mass with minimal shadowing at the 11:30 position of the left breast 8 cm from the nipple measuring 8 x 9 x 9 mm. Ultrasound of the left axilla demonstrates no abnormal appearing lymph nodes.  IMPRESSION: Suspicious 8 x 9 x 9 mm irregular bordered hypoechoic mass at the 11:30 position of the left breast 8 cm from the nipple.  RECOMMENDATION: Recommend ultrasound-guided core needle biopsy of  the suspicious mass.  I have discussed the findings and recommendations with the patient. Results were also provided in writing at the conclusion of the visit. If applicable, a reminder letter will be sent to the patient regarding the next appointment.  BI-RADS CATEGORY  5: Highly suggestive of malignancy - appropriate action should be taken.  Biopsy will be done today.   Electronically Signed   By: Elberta Fortis M.D.   On: 10/11/2013 16:33   Mm Digital Diagnostic Unilat L  10/11/2013   CLINICAL DATA:  The patient is post ultrasound-guided core biopsy of a 9 mm spiculated mass over the 11:30 position of the left breast.  EXAM: DIGITAL DIAGNOSTIC UNILATERAL LEFT MAMMOGRAM  COMPARISON:  Previous exams  FINDINGS: Mammographic images were obtained following ultrasound guided biopsy of an irregular 9 mm mass of the 11:30 position of the left breast. Exam demonstrates satisfactory placement of a cylindrical shaped metallic clip over the biopsied mass in the upper inner left breast.  IMPRESSION: Satisfactory clip placement post ultrasound core biopsy left breast.  Final Assessment: Post Procedure Mammograms for Marker Placement   Electronically Signed   By: Elberta Fortis M.D.   On: 10/11/2013 17:04   Mm Radiologist Eval And Mgmt  10/12/2013   EXAM: ESTABLISHED PATIENT OFFICE VISIT -LEVEL II (16109)  HISTORY OF PRESENT ILLNESS: Screening detected left breast mass. Ultrasound-guided core needle biopsy was performed yesterday. Pathology returned as grade 2-3 invasive ductal carcinoma.  Patient denies significant pain or bleeding at the biopsy site.  CHIEF COMPLAINT: Patient returns 1 day post ultrasound-guided core needle biopsy of a screening detected 0.9 cm mass in the upper left breast.  PHYSICAL EXAMINATION: Biopsy site in the upper left breast is clean without evidence of bleeding from the incision. The breast is soft on palpation and there is no evidence of hematoma.  ASSESSMENT AND PLAN: 0.9 cm mass in the  upper left breast, pathology from ultrasound-guided core needle biopsy revealing grade 2-3 invasive ductal carcinoma, concordant with imaging findings.  Patient is scheduled for the Multidisciplinary Breast Cancer Clinic at the [facility] on Wednesday, October 22.   Electronically Signed   By: Hulan Saas M.D.   On: 10/12/2013 17:08   [place] Lt Breast Bx W Loc Dev 1st Lesion Img Bx Spec US Guide  10/11/2013   CLINICAL DATA:  Patient presents for ultrasound-guided core needle biopsy of a 9 mm suspicious mass at the 11:30 position of the left breast 8 cm from the nipple.  EXAM: RADIOLOGY EXAMINATION  COMPARISON:  Previous exams.  PROCEDURE: I met with the patient and we discussed the procedure of ultrasound-guided biopsy, including benefits and alternatives. We discussed the high likelihood of a successful procedure. We discussed the risks of the procedure including infection, bleeding, tissue injury, clip migration, and inadequate sampling. Informed written consent was given.  Using sterile technique and 2% Lidocaine  as local anesthetic, under direct ultrasound visualization, a 12 gauge vacuum-assisteddevice was used to perform biopsy of the targeted mass at the 11:30 position using a lateral to medial approach. At the conclusion of the procedure, a top hat shaped tissue marker clip was deployed into the biopsy cavity. Follow-up 2-view mammogram was performed and dictated separately.  The usual time-out protocol was performed immediately prior to the procedure.  IMPRESSION: Ultrasound-guided biopsy of a suspicious left breast mass. No apparent complications.   Electronically Signed   By: Elberta Fortis M.D.   On: 10/11/2013 16:41     LABS:    Chemistry      Component Value Date/Time   NA 139 10/20/2013 1221   NA 140 04/18/2011 1136   K 3.7 10/20/2013 1221   K 3.8 04/18/2011 1136   CL 103 04/18/2011 1136   CO2 25 10/20/2013 1221   CO2 28 04/18/2011 1136   BUN 14.2  10/20/2013 1221   BUN 13 04/18/2011 1136   CREATININE 0.7 10/20/2013 1221   CREATININE 0.6 04/18/2011 1136      Component Value Date/Time   CALCIUM 9.9 10/20/2013 1221   CALCIUM 9.3 04/18/2011 1136   ALKPHOS 36* 10/20/2013 1221   AST 12 10/20/2013 1221   ALT 17 10/20/2013 1221   BILITOT 0.71 10/20/2013 1221      Lab Results  Component Value Date   WBC 9.5 10/20/2013   HGB 12.6 10/20/2013   HCT 38.4 10/20/2013   MCV 82.6 10/20/2013   PLT 252 10/20/2013   PATHOLOGY: ADDITIONAL INFORMATION: 1. A sample (block 1B) was sent to [facility] for Oncotype testing. The patient's recurrence score is 29. Those patients who had a recurrence score of 29 had an average rate of distant recurrence of 17%. Pecola Leisure MD Pathologist, Electronic Signature ( Signed 11/22/2013) 1. CHROMOGENIC IN-SITU HYBRIDIZATION Results: HER-2/NEU BY CISH - NO AMPLIFICATION OF HER-2 DETECTED. RESULT RATIO OF HER2: CEP 17 SIGNALS 0.95 AVERAGE HER2 COPY NUMBER PER CELL 1.95 REFERENCE RANGE NEGATIVE HER2/Chr17 Ratio <2.0 and Average HER2 copy number <4.0 EQUIVOCAL HER2/Chr17 Ratio <2.0 and Average HER2 copy number 4.0 and <6.0 POSITIVE HER2/Chr17 Ratio >=2.0 and/or Average HER2 copy number >=6.0 Pecola Leisure MD Pathologist, Electronic Signature ( Signed 11/04/2013) FINAL DIAGNOSIS Diagnosis 1. Breast, lumpectomy, Left - INVASIVE DUCTAL CARCINOMA, SEE COMMENT. 1 of 4 FINAL for Veronica Rogers, Veronica Rogers [phone]) Diagnosis(continued) - LYMPHOVASCULAR INVASION IDENTIFIED. - INVASIVE TUMOR IS 1 MM FROM NEAREST MARGIN (MEDIAL). - PREVIOUS BIOPSY SITE IDENTIFIED. - SEE TUMOR TEMPLATE BELOW. 2. Breast, excision, left - BENIGN BREAST TISSUE, SEE COMMENT. - NEGATIVE FOR ATYPIA OR MALIGNANCY. - SURGICAL MARGINS, NEGATIVE FOR ATYPIA OR MALIGNANCY. 3. Lymph node, sentinel, biopsy, #1 left Axilla - ONE LYMPH NODE, POSITIVE FOR METASTATIC MAMMARY CARCINOMA (1/1). - TUMOR DEPOSIT IS 1 MM. - NO EXTRACAPSULAR TUMOR  EXTENSION IDENTIFIED. 4. Lymph nodes, regional resection, left Axilla - THREE LYMPH NODES, NEGATIVE FOR TUMOR (0/3). 5. Lymph node, sentinel, biopsy, #2 left - ONE LYMPH NODE, NEGATIVE FOR TUMOR (0/1). 6. Lymph node, sentinel, biopsy, #3 left - ONE LYMPH NODE, NEGATIVE FOR TUMOR (0/1). 7. Lymph node, sentinel, biopsy, #4 left - ONE LYMPH NODE, NEGATIVE FOR TUMOR (0/1). Microscopic Comment 1. BREAST, INVASIVE TUMOR, WITH LYMPH NODE SAMPLING Specimen, including laterality and lymph node sampling (sentinel, non-sentinel): Left breast plus sentinel lymph nodes Procedure: Lumpectomy Histologic type: Ductal Grade: II of III Tubule formation: II Nuclear pleomorphism: II Mitotic:II Tumor size (gross measurement): 0.9 cm Margins: Invasive, distance to closest margin:  1 mm In-situ, distance to closest margin: N/A If margin positive, focally or broadly: N/A Lymphovascular invasion: Present Ductal carcinoma in situ: Absent Grade: N/A Extensive intraductal component: N/A Lobular neoplasia: Absent Tumor focality: Unifocal Treatment effect: None If present, treatment effect in breast tissue, lymph nodes or both: N/A Extent of tumor: Skin: N/A Nipple: N/A Skeletal muscle: N/A Lymph nodes: Examined: 4 Sentinel 3 Non-sentinel 7 Total 2 of 4 FINAL for Veronica Rogers, Veronica Rogers [id number]) Microscopic Comment(continued) Lymph nodes with metastasis: 1 Isolated tumor cells (< 0.2 mm): N/A Micrometastasis: (> 0.2 mm and < 2.0 mm): 1 = 1 mm Macrometastasis: (> 2.0 mm): N/A Extracapsular extension: Absent Breast prognostic profile: Estrogen receptor: Not repeated, previous study demonstrated 100% positivity ([id number]) Progesterone receptor: Not repeated, previous study demonstrated 100% positivity ([id number]) Her 2 neu: Repeated, previous study demonstrated no amplification (1.85) ([id number]) Ki-67: Not repeated, previous study demonstrated 46% proliferative rate  ([id number]) Non-neoplastic breast: Previous biopsy site TNM: pT1b, pN1, pMX Comments: None 2. There is no mass grossly identified. Representative section, including margins, demonstrate a nonneoplastic to include fibrocystic change, pseudoangiomatous stromal hyperplasia, and usual ductal hyperplasia. There are no atypical or malignant epithelial or stromal features present. [place] RUND DO Pathologist, Electronic Signature (Case signed 11/02/2013) Intraoperative Diagnosis RAPID INTRAOPERAS  ASSESSMENT    52 year old female with  #1 a screen detected left breast mass measuring 9 mm by ultrasound at the 11:30 o'clock position 8 cm from the nipple. She did not get an MRI. 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   #2 final pathology revealed grade 2, 0.9 cm invasive ductal carcinoma one sentinel node was positive for metastatic disease. Tumor ER positive PR positive HER-2/neu negative with an elevated Ki-67.  #3 Oncotype DX testing recurrence score 29 giving her a 17% risk of distant recurrence at 5 years with tamoxifen alone. I have recommended adjuvant curative intent chemotherapy consisting of Taxotere Cytoxan for a total of 4 cycles. Risks benefits and side effects were discussed with the patient.   PLAN: #1 proceed with Port-A-Cath placement.  #2 chemotherapy teaching class.  #3 hope is to get her started on chemotherapy in a second week of December.  Thank you so much for allowing me to participate in the care of Veronica Rogers. I will continue to follow up the patient with you and assist in her care.  All questions were answered. The patient knows to call the clinic with any problems, questions or concerns. We can certainly see the patient much sooner if necessary.  I spent 30 minutes counseling the patient face to face. The total time  spent in the appointment was 30 minutes2.  Drue Second, MD Medical/Oncology [facility] [phone] (beeper) [phone] (Office)

## 2013-11-29 NOTE — Telephone Encounter (Signed)
Patient called asking about when her PAC insertion would be.  Patient states that Dr. Welton Flakes told her last week she would send a message to Dr. Dwain Sarna and hopefully get the [facility] inserted this week.  I explained to the patient that I do not see a message however I will send a message on to Dr. Dwain Sarna since I do see mention of it in Dr. Milta Deiters office visit notes.  Patient states understanding of the plan at this time and agreeable.

## 2013-11-30 ENCOUNTER — Telehealth: Payer: Self-pay | Admitting: *Deleted

## 2013-11-30 NOTE — Telephone Encounter (Signed)
Per staff message and POF I have scheduled appts.  JMW  

## 2013-11-30 NOTE — Telephone Encounter (Signed)
Lm gv appt for chemo edu on 12/06/13@ 10am, 12/07/13 w/labs @ 10:30am, ov@ 11am, and tx to follow and all her other appts. i emailed MW to add the tx's. i made pt aware to pickup an avs on 12/06/13....td

## 2013-11-30 NOTE — Telephone Encounter (Signed)
Patient called again asking about PAC insert.  I advised patient that she really needs to contact Dr. Milta Deiters office to see what the plan is for this.  Patient states understanding and agreeable at this time.

## 2013-12-01 ENCOUNTER — Ambulatory Visit: Payer: 59 | Admitting: Radiation Oncology

## 2013-12-01 ENCOUNTER — Other Ambulatory Visit: Payer: Self-pay | Admitting: Emergency Medicine

## 2013-12-01 ENCOUNTER — Ambulatory Visit: Payer: 59

## 2013-12-01 DIAGNOSIS — C50212 Malignant neoplasm of upper-inner quadrant of left female breast: Secondary | ICD-10-CM

## 2013-12-02 ENCOUNTER — Other Ambulatory Visit: Payer: Self-pay | Admitting: Radiology

## 2013-12-03 ENCOUNTER — Other Ambulatory Visit: Payer: Self-pay | Admitting: Oncology

## 2013-12-03 ENCOUNTER — Encounter (HOSPITAL_COMMUNITY): Payer: Self-pay

## 2013-12-03 ENCOUNTER — Ambulatory Visit (HOSPITAL_COMMUNITY)
Admission: RE | Admit: 2013-12-03 | Discharge: 2013-12-03 | Disposition: A | Discharge status: Home / Self Care | Payer: 59 | Source: Ambulatory Visit | Attending: Oncology | Admitting: Oncology

## 2013-12-03 DIAGNOSIS — C50212 Malignant neoplasm of upper-inner quadrant of left female breast: Secondary | ICD-10-CM

## 2013-12-03 DIAGNOSIS — C50919 Malignant neoplasm of unspecified site of unspecified female breast: Secondary | ICD-10-CM | POA: Insufficient documentation

## 2013-12-03 LAB — PROTIME-INR: INR: 0.94 (ref 0.00–1.49)

## 2013-12-03 LAB — CBC
HCT: 38.8 % (ref 36.0–46.0)
Hemoglobin: 12.6 g/dL (ref 12.0–15.0)
MCH: 27 pg (ref 26.0–34.0)
MCV: 83.1 fL (ref 78.0–100.0)
Platelets: 243 10*3/uL (ref 150–400)
RDW: 13.4 % (ref 11.5–15.5)

## 2013-12-03 LAB — APTT: aPTT: 29 seconds (ref 24–37)

## 2013-12-03 MED ORDER — LIDOCAINE HCL 1 % IJ SOLN
INTRAMUSCULAR | Status: AC
  Filled 2013-12-03: qty 20

## 2013-12-03 MED ORDER — MIDAZOLAM HCL 2 MG/2ML IJ SOLN
INTRAMUSCULAR | Status: DC | PRN
  Administered 2013-12-03 (×6): 1 mg via INTRAVENOUS

## 2013-12-03 MED ORDER — HEPARIN SOD (PORK) LOCK FLUSH 100 UNIT/ML IV SOLN
INTRAVENOUS | Status: AC
  Filled 2013-12-03: qty 5

## 2013-12-03 MED ORDER — FENTANYL CITRATE 0.05 MG/ML IJ SOLN
INTRAMUSCULAR | Status: DC | PRN
  Administered 2013-12-03 (×4): 50 ug via INTRAVENOUS

## 2013-12-03 MED ORDER — CEFAZOLIN SODIUM-DEXTROSE 2-3 GM-% IV SOLR
2.0000 g | INTRAVENOUS | Status: AC
  Administered 2013-12-03: 2 g via INTRAVENOUS
  Filled 2013-12-03: qty 50

## 2013-12-03 MED ORDER — SODIUM CHLORIDE 0.9 % IV SOLN
INTRAVENOUS | Status: DC
  Administered 2013-12-03: 14:00:00 via INTRAVENOUS

## 2013-12-03 MED ORDER — FENTANYL CITRATE 0.05 MG/ML IJ SOLN
INTRAMUSCULAR | Status: AC
  Filled 2013-12-03: qty 6

## 2013-12-03 MED ORDER — MIDAZOLAM HCL 2 MG/2ML IJ SOLN
INTRAMUSCULAR | Status: AC
  Filled 2013-12-03: qty 6

## 2013-12-03 NOTE — H&P (Signed)
Agree.  For port placement today. 

## 2013-12-03 NOTE — H&P (Signed)
Chief Complaint: "I'm here for a port" Referring Physician:Khan HPI: Veronica Rogers is an 52 y.o. female with breast cancer on the left side. She is to start chemo next week and is scheduled today for port placement. PMHx and meds reviewed. Pt fels ok otherwise, no recent fevers, chills, illness.  Past Medical History:  Past Medical History  Diagnosis Date  . Breast cancer   . Anxiety   . Depression   . Contact lens/glasses fitting     wears contacts or glasses    Past Surgical History:  Past Surgical History  Procedure Laterality Date  . Breast biopsy  1996    right breast, benign  . Wisdom tooth extraction    . Colonoscopy    . Breast lumpectomy with needle localization and axillary sentinel lymph node bx Left 10/29/2013    Procedure: BREAST LUMPECTOMY WITH NEEDLE LOCALIZATION AND AXILLARY SENTINEL LYMPH NODE BX;  Surgeon: Emelia Loron, MD;  Location: [facility];  Service: General;  Laterality: Left;    Family History:  Family History  Problem Relation Age of Onset  . Squamous cell carcinoma Father   . Cancer Father   . Diabetes Father   . Hypertension Mother   . Diabetes Sister     Social History:  reports that she has never smoked. She has never used smokeless tobacco. She reports that she drinks alcohol. She reports that she does not use illicit drugs.  Allergies:  Allergies  Allergen Reactions  . Erythromycin Other (See Comments)    Stomach pain  . Tape Rash    Medications:   Medication List    ASK your doctor about these medications       sertraline 50 MG tablet  Commonly known as:  ZOLOFT  Take 25 mg by mouth daily.        Please HPI for pertinent positives, otherwise complete 10 system ROS negative.  Physical Exam: BP 150/91  Pulse 76  Temp(Src) 98 F (36.7 C) (Oral)  Resp 16  SpO2 95% There is no weight on file to calculate BMI.   General Appearance:  Alert, cooperative, no distress, appears stated age  Head:   Normocephalic, without obvious abnormality, atraumatic  ENT: Unremarkable  Neck: Supple, symmetrical, trachea midline  Lungs:   Clear to auscultation bilaterally, no w/r/r, respirations unlabored without use of accessory muscles.  Chest Wall:  No tenderness or deformity  Heart:  Regular rate and rhythm, S1, S2 normal, no murmur, rub or gallop.  Neurologic: Normal affect, no gross deficits.   No results found for this or any previous visit (from the past 48 hour(s)). No results found.  Assessment/Plan Breast cancer Discussed port placement. Explained procedure, risks, complications, use of sedation. Labs pending Consent signed in chart  Brayton El PA-C 12/03/2013, 2:09 PM

## 2013-12-03 NOTE — Procedures (Addendum)
Procedure:  Porta-cath Access:  Right IJ vein Findings:  Catheter tip at cavoatrial junction.  No PTX.  OK to use. 

## 2013-12-06 ENCOUNTER — Other Ambulatory Visit: Payer: Self-pay | Admitting: Emergency Medicine

## 2013-12-06 ENCOUNTER — Other Ambulatory Visit: Payer: 59

## 2013-12-06 ENCOUNTER — Telehealth: Payer: Self-pay | Admitting: *Deleted

## 2013-12-06 DIAGNOSIS — C50212 Malignant neoplasm of upper-inner quadrant of left female breast: Secondary | ICD-10-CM

## 2013-12-06 MED ORDER — DEXAMETHASONE 4 MG PO TABS: ORAL_TABLET | ORAL | Status: DC

## 2013-12-06 MED ORDER — LIDOCAINE-PRILOCAINE 2.5-2.5 % EX CREA: 1.0000 "application " | TOPICAL_CREAM | CUTANEOUS | Status: DC | PRN

## 2013-12-06 MED ORDER — LORAZEPAM 0.5 MG PO TABS: ORAL_TABLET | ORAL | Status: DC

## 2013-12-06 MED ORDER — ONDANSETRON HCL 8 MG PO TABS: ORAL_TABLET | ORAL | Status: DC

## 2013-12-06 MED ORDER — PROCHLORPERAZINE MALEATE 10 MG PO TABS: 10.0000 mg | ORAL_TABLET | Freq: Four times a day (QID) | ORAL | Status: DC | PRN

## 2013-12-06 NOTE — Telephone Encounter (Signed)
Pt is receiving cancer treatments, she was advised by oncologist to get a pneumonia shot. She is a pt of Dr Dayton Martes, is this ok? If so pt request a call back to schedule appt for the shot.

## 2013-12-06 NOTE — Telephone Encounter (Signed)
Yes, she should have a pneumovax

## 2013-12-07 ENCOUNTER — Encounter: Payer: Self-pay | Admitting: Oncology

## 2013-12-07 ENCOUNTER — Other Ambulatory Visit (HOSPITAL_BASED_OUTPATIENT_CLINIC_OR_DEPARTMENT_OTHER): Payer: 59 | Admitting: Lab

## 2013-12-07 ENCOUNTER — Ambulatory Visit (HOSPITAL_BASED_OUTPATIENT_CLINIC_OR_DEPARTMENT_OTHER): Payer: 59

## 2013-12-07 ENCOUNTER — Telehealth: Payer: Self-pay | Admitting: Oncology

## 2013-12-07 ENCOUNTER — Other Ambulatory Visit: Payer: Self-pay | Admitting: Emergency Medicine

## 2013-12-07 ENCOUNTER — Encounter (INDEPENDENT_AMBULATORY_CARE_PROVIDER_SITE_OTHER): Payer: Self-pay

## 2013-12-07 ENCOUNTER — Ambulatory Visit (HOSPITAL_BASED_OUTPATIENT_CLINIC_OR_DEPARTMENT_OTHER): Payer: 59 | Admitting: Oncology

## 2013-12-07 ENCOUNTER — Telehealth: Payer: Self-pay | Admitting: *Deleted

## 2013-12-07 ENCOUNTER — Other Ambulatory Visit: Payer: 59

## 2013-12-07 VITALS — BP 141/76 | HR 85 | Temp 98.8°F | Resp 16

## 2013-12-07 VITALS — BP 157/84 | HR 102 | Temp 97.4°F | Resp 18 | Ht 68.0 in | Wt 199.2 lb

## 2013-12-07 DIAGNOSIS — Z170 Estrogen receptor positive status [ER+]: Secondary | ICD-10-CM

## 2013-12-07 DIAGNOSIS — C50219 Malignant neoplasm of upper-inner quadrant of unspecified female breast: Secondary | ICD-10-CM

## 2013-12-07 DIAGNOSIS — C50212 Malignant neoplasm of upper-inner quadrant of left female breast: Secondary | ICD-10-CM

## 2013-12-07 DIAGNOSIS — F411 Generalized anxiety disorder: Secondary | ICD-10-CM

## 2013-12-07 DIAGNOSIS — Z5111 Encounter for antineoplastic chemotherapy: Secondary | ICD-10-CM

## 2013-12-07 LAB — COMPREHENSIVE METABOLIC PANEL (CC13)
ALT: 28 U/L (ref 0–55)
Alkaline Phosphatase: 49 U/L (ref 40–150)
Anion Gap: 16 mEq/L — ABNORMAL HIGH (ref 3–11)
BUN: 13.6 mg/dL (ref 7.0–26.0)
CO2: 20 mEq/L — ABNORMAL LOW (ref 22–29)
Calcium: 10.6 mg/dL — ABNORMAL HIGH (ref 8.4–10.4)
Chloride: 101 mEq/L (ref 98–109)
Creatinine: 0.8 mg/dL (ref 0.6–1.1)
Potassium: 3.9 mEq/L (ref 3.5–5.1)
Total Bilirubin: 0.72 mg/dL (ref 0.20–1.20)

## 2013-12-07 LAB — CBC WITH DIFFERENTIAL/PLATELET
Eosinophils Absolute: 0 10*3/uL (ref 0.0–0.5)
HCT: 39.5 % (ref 34.8–46.6)
HGB: 13 g/dL (ref 11.6–15.9)
LYMPH%: 11.6 % — ABNORMAL LOW (ref 14.0–49.7)
MCHC: 32.9 g/dL (ref 31.5–36.0)
MCV: 82.1 fL (ref 79.5–101.0)
MONO%: 1.5 % (ref 0.0–14.0)
NEUT#: 11.5 10*3/uL — ABNORMAL HIGH (ref 1.5–6.5)
NEUT%: 86.8 % — ABNORMAL HIGH (ref 38.4–76.8)
Platelets: 258 10*3/uL (ref 145–400)
RBC: 4.81 10*6/uL (ref 3.70–5.45)
WBC: 13.2 10*3/uL — ABNORMAL HIGH (ref 3.9–10.3)
nRBC: 0 % (ref 0–0)

## 2013-12-07 MED ORDER — SODIUM CHLORIDE 0.9 % IV SOLN
600.0000 mg/m2 | Freq: Once | INTRAVENOUS | Status: AC
  Administered 2013-12-07: 1240 mg via INTRAVENOUS
  Filled 2013-12-07: qty 62

## 2013-12-07 MED ORDER — ONDANSETRON 16 MG/50ML IVPB (CHCC)
INTRAVENOUS | Status: AC
  Filled 2013-12-07: qty 16

## 2013-12-07 MED ORDER — SODIUM CHLORIDE 0.9 % IV SOLN
75.0000 mg/m2 | Freq: Once | INTRAVENOUS | Status: AC
  Administered 2013-12-07: 160 mg via INTRAVENOUS
  Filled 2013-12-07: qty 16

## 2013-12-07 MED ORDER — SODIUM CHLORIDE 0.9 % IJ SOLN
10.0000 mL | INTRAMUSCULAR | Status: DC | PRN
  Administered 2013-12-07: 10 mL
  Filled 2013-12-07: qty 10

## 2013-12-07 MED ORDER — DEXAMETHASONE SODIUM PHOSPHATE 20 MG/5ML IJ SOLN
INTRAMUSCULAR | Status: AC
  Filled 2013-12-07: qty 5

## 2013-12-07 MED ORDER — DEXAMETHASONE SODIUM PHOSPHATE 20 MG/5ML IJ SOLN
20.0000 mg | Freq: Once | INTRAMUSCULAR | Status: AC
  Administered 2013-12-07: 20 mg via INTRAVENOUS

## 2013-12-07 MED ORDER — SODIUM CHLORIDE 0.9 % IV SOLN
Freq: Once | INTRAVENOUS | Status: AC
  Administered 2013-12-07: 13:00:00 via INTRAVENOUS

## 2013-12-07 MED ORDER — HEPARIN SOD (PORK) LOCK FLUSH 100 UNIT/ML IV SOLN
500.0000 [IU] | Freq: Once | INTRAVENOUS | Status: AC | PRN
  Administered 2013-12-07: 500 [IU]
  Filled 2013-12-07: qty 5

## 2013-12-07 MED ORDER — UNABLE TO FIND: Status: DC

## 2013-12-07 MED ORDER — ONDANSETRON 16 MG/50ML IVPB (CHCC)
16.0000 mg | Freq: Once | INTRAVENOUS | Status: AC
  Administered 2013-12-07: 16 mg via INTRAVENOUS

## 2013-12-07 NOTE — Patient Instructions (Signed)
[facility] Discharge Instructions for Patients Receiving Chemotherapy  Today you received the following chemotherapy agents Taxotere, Cytoxin  To help prevent nausea and vomiting after your treatment, we encourage you to take your nausea medication Zofran twice per day for 3 days, beginning the morning of 02/08/13.  Compazine, take 1 tablet every 6 hours as needed, Ativan, take 1 tablet every 6 hours as needed.     If you develop nausea and vomiting that is not controlled by your nausea medication, call the clinic.   BELOW ARE SYMPTOMS THAT SHOULD BE REPORTED IMMEDIATELY:  *FEVER GREATER THAN 100.5 F  *CHILLS WITH OR WITHOUT FEVER  NAUSEA AND VOMITING THAT IS NOT CONTROLLED WITH YOUR NAUSEA MEDICATION  *UNUSUAL SHORTNESS OF BREATH  *UNUSUAL BRUISING OR BLEEDING  TENDERNESS IN MOUTH AND THROAT WITH OR WITHOUT PRESENCE OF ULCERS  *URINARY PROBLEMS  *BOWEL PROBLEMS  UNUSUAL RASH Items with * indicate a potential emergency and should be followed up as soon as possible.  Feel free to call the clinic should you have any questions or concerns. The clinic phone number is [phone]. . It was a pleasure to serve you today! Docetaxel injection What is this medicine? DOCETAXEL (doe se TAX el) is a chemotherapy drug. It targets fast dividing cells, like cancer cells, and causes these cells to die. This medicine is used to treat many types of cancers like breast cancer, certain stomach cancers, head and neck cancer, lung cancer, and prostate cancer. This medicine may be used for other purposes; ask your health care provider or pharmacist if you have questions. COMMON BRAND NAME(S): Docefrez , Taxotere What should I tell my health care provider before I take this medicine? They need to know if you have any of these conditions: -infection (especially a virus infection such as chickenpox, cold sores, or herpes) -liver disease -low blood counts, like low white cell,  platelet, or red cell counts -an unusual or allergic reaction to docetaxel, polysorbate 80, other chemotherapy agents, other medicines, foods, dyes, or preservatives -pregnant or trying to get pregnant -breast-feeding How should I use this medicine? This drug is given as an infusion into a vein. It is administered in a hospital or clinic by a specially trained health care professional. Talk to your pediatrician regarding the use of this medicine in children. Special care may be needed. Overdosage: If you think you have taken too much of this medicine contact a poison control center or emergency room at once. NOTE: This medicine is only for you. Do not share this medicine with others. What if I miss a dose? It is important not to miss your dose. Call your doctor or health care professional if you are unable to keep an appointment. What may interact with this medicine? -cyclosporine -erythromycin -ketoconazole -medicines to increase blood counts like filgrastim, pegfilgrastim, sargramostim -vaccines Talk to your doctor or health care professional before taking any of these medicines: -acetaminophen -aspirin -ibuprofen -ketoprofen -naproxen This list may not describe all possible interactions. Give your health care provider a list of all the medicines, herbs, non-prescription drugs, or dietary supplements you use. Also tell them if you smoke, drink alcohol, or use illegal drugs. Some items may interact with your medicine. What should I watch for while using this medicine? Your condition will be monitored carefully while you are receiving this medicine. You will need important blood work done while you are taking this medicine. This drug may make you feel generally unwell. This is not uncommon, as  chemotherapy can affect healthy cells as well as cancer cells. Report any side effects. Continue your course of treatment even though you feel ill unless your doctor tells you to stop. In some cases,  you may be given additional medicines to help with side effects. Follow all directions for their use. Call your doctor or health care professional for advice if you get a fever, chills or sore throat, or other symptoms of a cold or flu. Do not treat yourself. This drug decreases your body's ability to fight infections. Try to avoid being around people who are sick. This medicine may increase your risk to bruise or bleed. Call your doctor or health care professional if you notice any unusual bleeding. Be careful brushing and flossing your teeth or using a toothpick because you may get an infection or bleed more easily. If you have any dental work done, tell your dentist you are receiving this medicine. Avoid taking products that contain aspirin, acetaminophen, ibuprofen, naproxen, or ketoprofen unless instructed by your doctor. These medicines may hide a fever. Do not become pregnant while taking this medicine. Women should inform their doctor if they wish to become pregnant or think they might be pregnant. There is a potential for serious side effects to an unborn child. Talk to your health care professional or pharmacist for more information. Do not breast-feed an infant while taking this medicine. What side effects may I notice from receiving this medicine? Side effects that you should report to your doctor or health care professional as soon as possible: -allergic reactions like skin rash, itching or hives, swelling of the face, lips, or tongue -low blood counts - This drug may decrease the number of white blood cells, red blood cells and platelets. You may be at increased risk for infections and bleeding. -signs of infection - fever or chills, cough, sore throat, pain or difficulty passing urine -signs of decreased platelets or bleeding - bruising, pinpoint red spots on the skin, black, tarry stools, nosebleeds -signs of decreased red blood cells - unusually weak or tired, fainting spells,  lightheadedness -breathing problems -fast or irregular heartbeat -low blood pressure -mouth sores -nausea and vomiting -pain, swelling, redness or irritation at the injection site -pain, tingling, numbness in the hands or feet -swelling of the ankle, feet, hands -weight gain Side effects that usually do not require medical attention (report to your prescriber or health care professional if they continue or are bothersome): -bone pain -complete hair loss including hair on your head, underarms, pubic hair, eyebrows, and eyelashes -diarrhea -excessive tearing -changes in the color of fingernails -loosening of the fingernails -nausea -muscle pain -red flush to skin -sweating -weak or tired This list may not describe all possible side effects. Call your doctor for medical advice about side effects. You may report side effects to FDA at 1-800-FDA-1088. Where should I keep my medicine? This drug is given in a hospital or clinic and will not be stored at home. NOTE: This sheet is a summary. It may not cover all possible information. If you have questions about this medicine, talk to your doctor, pharmacist, or health care provider.  2014, [employer] Standard. (2008-11-28 11:52:10) Cyclophosphamide injection What is this medicine? CYCLOPHOSPHAMIDE (sye kloe FOSS fa mide) is a chemotherapy drug. It slows the growth of cancer cells. This medicine is used to treat many types of cancer like lymphoma, myeloma, leukemia, breast cancer, and ovarian cancer, to name a few. This medicine may be used for other purposes; ask your health  care provider or pharmacist if you have questions. COMMON BRAND NAME(S): Cytoxan, Neosar What should I tell my health care provider before I take this medicine? They need to know if you have any of these conditions: -blood disorders -history of other chemotherapy -infection -kidney disease -liver disease -recent or ongoing radiation therapy -tumors in the bone  marrow -an unusual or allergic reaction to cyclophosphamide, other chemotherapy, other medicines, foods, dyes, or preservatives -pregnant or trying to get pregnant -breast-feeding How should I use this medicine? This drug is usually given as an injection into a vein or muscle or by infusion into a vein. It is administered in a hospital or clinic by a specially trained health care professional. Talk to your pediatrician regarding the use of this medicine in children. Special care may be needed. Overdosage: If you think you have taken too much of this medicine contact a poison control center or emergency room at once. NOTE: This medicine is only for you. Do not share this medicine with others. What if I miss a dose? It is important not to miss your dose. Call your doctor or health care professional if you are unable to keep an appointment. What may interact with this medicine? This medicine may interact with the following medications: -amiodarone -amphotericin B -azathioprine -certain antiviral medicines for HIV or AIDS such as protease inhibitors (e.g., indinavir, ritonavir) and zidovudine -certain blood pressure medications such as benazepril, captopril, enalapril, fosinopril, lisinopril, moexipril, monopril, perindopril, quinapril, ramipril, trandolapril -certain cancer medications such as anthracyclines (e.g., daunorubicin, doxorubicin), busulfan, cytarabine, paclitaxel, pentostatin, tamoxifen, trastuzumab -certain diuretics such as chlorothiazide, chlorthalidone, hydrochlorothiazide, indapamide, metolazone -certain medicines that treat or prevent blood clots like warfarin -certain muscle relaxants such as succinylcholine -cyclosporine -etanercept -indomethacin -medicines to increase blood counts like filgrastim, pegfilgrastim, sargramostim -medicines used as general anesthesia -metronidazole -natalizumab This list may not describe all possible interactions. Give your health care  provider a list of all the medicines, herbs, non-prescription drugs, or dietary supplements you use. Also tell them if you smoke, drink alcohol, or use illegal drugs. Some items may interact with your medicine. What should I watch for while using this medicine? Visit your doctor for checks on your progress. This drug may make you feel generally unwell. This is not uncommon, as chemotherapy can affect healthy cells as well as cancer cells. Report any side effects. Continue your course of treatment even though you feel ill unless your doctor tells you to stop. Drink water or other fluids as directed. Urinate often, even at night. In some cases, you may be given additional medicines to help with side effects. Follow all directions for their use. Call your doctor or health care professional for advice if you get a fever, chills or sore throat, or other symptoms of a cold or flu. Do not treat yourself. This drug decreases your body's ability to fight infections. Try to avoid being around people who are sick. This medicine may increase your risk to bruise or bleed. Call your doctor or health care professional if you notice any unusual bleeding. Be careful brushing and flossing your teeth or using a toothpick because you may get an infection or bleed more easily. If you have any dental work done, tell your dentist you are receiving this medicine. You may get drowsy or dizzy. Do not drive, use machinery, or do anything that needs mental alertness until you know how this medicine affects you. Do not become pregnant while taking this medicine or for 1 year after stopping  it. Women should inform their doctor if they wish to become pregnant or think they might be pregnant. Men should not father a child while taking this medicine and for 4 months after stopping it. There is a potential for serious side effects to an unborn child. Talk to your health care professional or pharmacist for more information. Do not  breast-feed an infant while taking this medicine. This medicine may interfere with the ability to have a child. This medicine has caused ovarian failure in some women. This medicine has caused reduced sperm counts in some men. You should talk with your doctor or health care professional if you are concerned about your fertility. If you are going to have surgery, tell your doctor or health care professional that you have taken this medicine. What side effects may I notice from receiving this medicine? Side effects that you should report to your doctor or health care professional as soon as possible: -allergic reactions like skin rash, itching or hives, swelling of the face, lips, or tongue -low blood counts - this medicine may decrease the number of white blood cells, red blood cells and platelets. You may be at increased risk for infections and bleeding. -signs of infection - fever or chills, cough, sore throat, pain or difficulty passing urine -signs of decreased platelets or bleeding - bruising, pinpoint red spots on the skin, black, tarry stools, blood in the urine -signs of decreased red blood cells - unusually weak or tired, fainting spells, lightheadedness -breathing problems -dark urine -dizziness -palpitations -swelling of the ankles, feet, hands -trouble passing urine or change in the amount of urine -weight gain -yellowing of the eyes or skin Side effects that usually do not require medical attention (report to your doctor or health care professional if they continue or are bothersome): -changes in nail or skin color -hair loss -missed menstrual periods -mouth sores -nausea, vomiting This list may not describe all possible side effects. Call your doctor for medical advice about side effects. You may report side effects to FDA at 1-800-FDA-1088. Where should I keep my medicine? This drug is given in a hospital or clinic and will not be stored at home. NOTE: This sheet is a  summary. It may not cover all possible information. If you have questions about this medicine, talk to your doctor, pharmacist, or health care provider.  2014, [employer]. (2012-10-30 16:22:58)

## 2013-12-07 NOTE — Telephone Encounter (Signed)
, °

## 2013-12-07 NOTE — Telephone Encounter (Deleted)
Left message for patient to call to schedule her appointment with Dr. Michell Heinrich.

## 2013-12-07 NOTE — Progress Notes (Signed)
Veronica Rogers [id number] 01-13-1961 52 y.o. 12/07/2013 11:28 AM  CC Richarda Osmond, MD [address] [place] [address]  diagnosis:  52 year old female with left breast cancer stage I. 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 Emelia Loron  Prior oncologic history:  Veronica Rogers is a 52 y.o. female.  Past medical history significant for anxiety.   #1Patient had a screening mammogram performed that revealed a left breast mass measuring 9 mm. By ultrasound it was 9 mm at the 11:30 o'clock position 8 cm from the nipple.  She did not have an MRI performed. 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 Patient's case was discussed at the multidisciplinary breast conference. She is now seen in the multidisciplinary breast clinic for discussion of treatment options. She was seen by Dr. Emelia Loron as well as Dr. Antony Blackbird.  #2 Patient is now status post lumpectomy performed on 10/29/2013. Her final pathology did reveal grade 2, 0.9 cm invasive ductal carcinoma that was ER positive PR positive. One sentinel node was positive for micrometastasis.  #3 Patient also had an Oncotype DX testing done and her recurrence score was 29 giving her a 17% risk of distant recurrence.  Current therapy: cycle 1 Taxotere Cytoxan adjuvantly  Interval history: Patient is seen in followup today to begin cycle 1 of her chemotherapy adjuvantly. This is curative intent. She overall feels well. She had a Port-A-Cath placed successfully without any problems. She is anxious  and nervous. We will plan on giving her some Ativan with her chemotherapy today  for anxiety. She denies any fevers chills night sweats headaches shortness of breath chest pains palpitations no myalgias and arthralgias. Remainder of the 10 point review of systems is negative.  Past Medical History: Past Medical History  Diagnosis Date  . Breast cancer   . Anxiety   . Depression   . Contact lens/glasses fitting     wears contacts or glasses    Past Surgical History: Past Surgical History  Procedure Laterality Date  . Breast biopsy  1996    right breast, benign  . Wisdom tooth extraction    . Colonoscopy    . Breast lumpectomy with needle localization and axillary sentinel lymph node bx Left 10/29/2013    Procedure: BREAST LUMPECTOMY WITH NEEDLE LOCALIZATION AND AXILLARY SENTINEL LYMPH NODE BX;  Surgeon: Emelia Loron, MD;  Location: [facility];  Service: General;  Laterality: Left;    Family History: Family History  Problem Relation Age of Onset  . Squamous cell carcinoma Father   . Cancer Father   . Diabetes Father   . Hypertension Mother   . Diabetes Sister     Social History History  Substance Use Topics  . Smoking status: Never Smoker   . Smokeless tobacco: Never Used  . Alcohol Use: Yes     Comment: 1-2 drinks a month    Allergies: Allergies  Allergen Reactions  . Erythromycin Other (See Comments)    Stomach pain  . Tape Rash    Current Medications: Current Outpatient Prescriptions  Medication Sig Dispense Refill  . dexamethasone (DECADRON) 4 MG tablet Take 2 tablets (8mg s) twice a day the day before Taxotere. Then take 2 tablets (8mg s) twice a day starting the day after Taxotere for 3 days.  30 tablet  1  . lidocaine-prilocaine (EMLA) cream Apply 1 application topically as needed.  30 g  1  . sertraline (ZOLOFT) 50 MG tablet Take 25 mg by mouth daily.       Marland Kitchen LORazepam (ATIVAN) 0.5 MG tablet Take 1 tablet (0.5 mg) every 6 hours as needed  for nausea or vomiting.  30 tablet  0  . ondansetron (ZOFRAN) 8 MG tablet Take two times a day starting the day after chemotherapy for 3 days. Then take two times a day as needed for nausea or vomiting.  30 tablet  1  . prochlorperazine (COMPAZINE) 10 MG tablet Take 1 tablet (10 mg total) by mouth every 6 (six) hours as needed for nausea or vomiting.  30 tablet  1  . UNABLE TO FIND Cranial prothesis due to chemotherapy induced alopecia.  1 Units  0   No current facility-administered medications for this visit.    OB/GYN History:menarche at age 75 she is premenopausal last menstrual period was on 10/13/2013 she has been on birth control pills to the present she is at first live birth at age 78.  Fertility Discussion:patient has completed her family Prior History of Cancer:no prior history  Health Maintenance:  Colonoscopy 2012 Bone Density no Last PAP smear September 2014  ECOG PERFORMANCE STATUS: 0 - Asymptomatic  Genetic Counseling/testing: no  REVIEW OF SYSTEMS:  A comprehensive review of systems was negative.  PHYSICAL EXAMINATION: Blood pressure 157/84, pulse 102, temperature 97.4 F (36.3 C), temperature source Oral, resp. rate 18, height 5\' 8"  (1.727 m), weight 199 lb 3 oz (90.351 kg), last menstrual period 10/04/2013.  NWG:NFAOZ, healthy, no distress, well nourished and well developed SKIN: skin color, texture, turgor are normal HEAD: Normocephalic EYES: PERRLA, EOMI, Conjunctiva are pink and non-injected EARS: External ears normal OROPHARYNX:no exudate, no erythema and lips, buccal mucosa, and tongue normal  NECK: no adenopathy, thyroid normal size, non-tender, without nodularity LYMPH:  no palpable lymphadenopathy, no hepatosplenomegaly BREAST:right breast normal without mass, skin or nipple changes or axillary nodes, left breast normal without mass, skin or nipple changes or axillary nodes LUNGS: clear to auscultation and percussion HEART: regular rate &  rhythm ABDOMEN:abdomen soft, non-tender, normal bowel sounds and no masses or organomegaly BACK: No CVA tenderness EXTREMITIES:less then 2 second capillary refill, no edema, no clubbing, no cyanosis  NEURO: alert & oriented x 3 with fluent speech, no focal motor/sensory deficits, gait normal     STUDIES/RESULTS: US Breast Left  2013-10-22   CLINICAL DATA:  Patient presents for additional views of the left breast as followup to a recent screening exam suggesting a possible mass.  EXAM: DIGITAL DIAGNOSTIC UNILATERAL LEFT MAMMOGRAM LIMITED; ULTRASOUND LEFT BREAST  COMPARISON:  09/01/2012, 09/27/2011, 07/19/2010, 05/26/2009, 04/01/2008 and 02/05/2007  ACR Breast Density Category b: There are scattered areas of fibroglandular density.  FINDINGS: Spot compression views demonstrate a spiculated 1 cm mass over the slightly inner upper left breast.  Ultrasound is performed, showing an irregular bordered hypoechoic solid mass with minimal shadowing at the 11:30 position of the left breast 8 cm from the nipple measuring 8 x 9 x 9 mm. Ultrasound of the left axilla demonstrates no abnormal appearing lymph nodes.  IMPRESSION: Suspicious 8 x 9 x 9 mm irregular bordered hypoechoic  mass at the 11:30 position of the left breast 8 cm from the nipple.  RECOMMENDATION: Recommend ultrasound-guided core needle biopsy of the suspicious mass.  I have discussed the findings and recommendations with the patient. Results were also provided in writing at the conclusion of the visit. If applicable, a reminder letter will be sent to the patient regarding the next appointment.  BI-RADS CATEGORY  5: Highly suggestive of malignancy - appropriate action should be taken.  Biopsy will be done today.   Electronically Signed   By: Elberta Fortis M.D.   On: 10/11/2013 16:33   Mm Digital Diag Ltd L  10/11/2013   CLINICAL DATA:  Patient presents for additional views of the left breast as followup to a recent screening exam suggesting a  possible mass.  EXAM: DIGITAL DIAGNOSTIC UNILATERAL LEFT MAMMOGRAM LIMITED; ULTRASOUND LEFT BREAST  COMPARISON:  09/01/2012, 09/27/2011, 07/19/2010, 05/26/2009, 04/01/2008 and 02/05/2007  ACR Breast Density Category b: There are scattered areas of fibroglandular density.  FINDINGS: Spot compression views demonstrate a spiculated 1 cm mass over the slightly inner upper left breast.  Ultrasound is performed, showing an irregular bordered hypoechoic solid mass with minimal shadowing at the 11:30 position of the left breast 8 cm from the nipple measuring 8 x 9 x 9 mm. Ultrasound of the left axilla demonstrates no abnormal appearing lymph nodes.  IMPRESSION: Suspicious 8 x 9 x 9 mm irregular bordered hypoechoic mass at the 11:30 position of the left breast 8 cm from the nipple.  RECOMMENDATION: Recommend ultrasound-guided core needle biopsy of the suspicious mass.  I have discussed the findings and recommendations with the patient. Results were also provided in writing at the conclusion of the visit. If applicable, a reminder letter will be sent to the patient regarding the next appointment.  BI-RADS CATEGORY  5: Highly suggestive of malignancy - appropriate action should be taken.  Biopsy will be done today.   Electronically Signed   By: Elberta Fortis M.D.   On: 10/11/2013 16:33   Mm Digital Diagnostic Unilat L  10/11/2013   CLINICAL DATA:  The patient is post ultrasound-guided core biopsy of a 9 mm spiculated mass over the 11:30 position of the left breast.  EXAM: DIGITAL DIAGNOSTIC UNILATERAL LEFT MAMMOGRAM  COMPARISON:  Previous exams  FINDINGS: Mammographic images were obtained following ultrasound guided biopsy of an irregular 9 mm mass of the 11:30 position of the left breast. Exam demonstrates satisfactory placement of a cylindrical shaped metallic clip over the biopsied mass in the upper inner left breast.  IMPRESSION: Satisfactory clip placement post ultrasound core biopsy left breast.  Final Assessment:  Post Procedure Mammograms for Marker Placement   Electronically Signed   By: Elberta Fortis M.D.   On: 10/11/2013 17:04   Mm Radiologist Eval And Mgmt  10/12/2013   EXAM: ESTABLISHED PATIENT OFFICE VISIT -LEVEL II ([address]109)  HISTORY OF PRESENT ILLNESS: Screening detected left breast mass. Ultrasound-guided core needle biopsy was performed yesterday. Pathology returned as grade 2-3 invasive ductal carcinoma.  Patient denies significant pain or bleeding at the biopsy site.  CHIEF COMPLAINT: Patient returns 1 day post ultrasound-guided core needle biopsy of a screening detected 0.9 cm mass in the upper left breast.  PHYSICAL EXAMINATION: Biopsy site in the upper left breast is clean without evidence of bleeding from the incision. The breast is soft on palpation and there is no evidence of hematoma.  ASSESSMENT AND PLAN: 0.9 cm mass in the upper left breast, pathology from ultrasound-guided core needle  biopsy revealing grade 2-3 invasive ductal carcinoma, concordant with imaging findings.  Patient is scheduled for the Multidisciplinary Breast Cancer Clinic at the [facility] on Wednesday, October 22.   Electronically Signed   By: Hulan Saas M.D.   On: 10/12/2013 17:08   [place] Lt Breast Bx W Loc Dev 1st Lesion Img Bx Spec US Guide  10/11/2013   CLINICAL DATA:  Patient presents for ultrasound-guided core needle biopsy of a 9 mm suspicious mass at the 11:30 position of the left breast 8 cm from the nipple.  EXAM: RADIOLOGY EXAMINATION  COMPARISON:  Previous exams.  PROCEDURE: I met with the patient and we discussed the procedure of ultrasound-guided biopsy, including benefits and alternatives. We discussed the high likelihood of a successful procedure. We discussed the risks of the procedure including infection, bleeding, tissue injury, clip migration, and inadequate sampling. Informed written consent was given.  Using sterile technique and 2% Lidocaine as local anesthetic,  under direct ultrasound visualization, a 12 gauge vacuum-assisteddevice was used to perform biopsy of the targeted mass at the 11:30 position using a lateral to medial approach. At the conclusion of the procedure, a top hat shaped tissue marker clip was deployed into the biopsy cavity. Follow-up 2-view mammogram was performed and dictated separately.  The usual time-out protocol was performed immediately prior to the procedure.  IMPRESSION: Ultrasound-guided biopsy of a suspicious left breast mass. No apparent complications.   Electronically Signed   By: Elberta Fortis M.D.   On: 10/11/2013 16:41     LABS:    Chemistry      Component Value Date/Time   NA 139 10/20/2013 1221   NA 140 04/18/2011 1136   K 3.7 10/20/2013 1221   K 3.8 04/18/2011 1136   CL 103 04/18/2011 1136   CO2 25 10/20/2013 1221   CO2 28 04/18/2011 1136   BUN 14.2 10/20/2013 1221   BUN 13 04/18/2011 1136   CREATININE 0.7 10/20/2013 1221   CREATININE 0.6 04/18/2011 1136      Component Value Date/Time   CALCIUM 9.9 10/20/2013 1221   CALCIUM 9.3 04/18/2011 1136   ALKPHOS 36* 10/20/2013 1221   AST 12 10/20/2013 1221   ALT 17 10/20/2013 1221   BILITOT 0.71 10/20/2013 1221      Lab Results  Component Value Date   WBC 13.2* 12/07/2013   HGB 13.0 12/07/2013   HCT 39.5 12/07/2013   MCV 82.1 12/07/2013   PLT 258 12/07/2013   PATHOLOGY: ADDITIONAL INFORMATION: 1. A sample (block 1B) was sent to [facility] for Oncotype testing. The patient's recurrence score is 29. Those patients who had a recurrence score of 29 had an average rate of distant recurrence of 17%. Pecola Leisure MD Pathologist, Electronic Signature ( Signed 11/22/2013) 1. CHROMOGENIC IN-SITU HYBRIDIZATION Results: HER-2/NEU BY CISH - NO AMPLIFICATION OF HER-2 DETECTED. RESULT RATIO OF HER2: CEP 17 SIGNALS 0.95 AVERAGE HER2 COPY NUMBER PER CELL 1.95 REFERENCE RANGE NEGATIVE HER2/Chr17 Ratio <2.0 and Average HER2 copy number <4.0 EQUIVOCAL HER2/Chr17 Ratio  <2.0 and Average HER2 copy number 4.0 and <6.0 POSITIVE HER2/Chr17 Ratio >=2.0 and/or Average HER2 copy number >=6.0 Pecola Leisure MD Pathologist, Electronic Signature ( Signed 11/04/2013) FINAL DIAGNOSIS Diagnosis 1. Breast, lumpectomy, Left - INVASIVE DUCTAL CARCINOMA, SEE COMMENT. 1 of 4 FINAL for Veronica Rogers, Veronica Rogers [phone]) Diagnosis(continued) - LYMPHOVASCULAR INVASION IDENTIFIED. - INVASIVE TUMOR IS 1 MM FROM NEAREST MARGIN (MEDIAL). - PREVIOUS BIOPSY SITE IDENTIFIED. - SEE TUMOR TEMPLATE BELOW. 2. Breast,  excision, left - BENIGN BREAST TISSUE, SEE COMMENT. - NEGATIVE FOR ATYPIA OR MALIGNANCY. - SURGICAL MARGINS, NEGATIVE FOR ATYPIA OR MALIGNANCY. 3. Lymph node, sentinel, biopsy, #1 left Axilla - ONE LYMPH NODE, POSITIVE FOR METASTATIC MAMMARY CARCINOMA (1/1). - TUMOR DEPOSIT IS 1 MM. - NO EXTRACAPSULAR TUMOR EXTENSION IDENTIFIED. 4. Lymph nodes, regional resection, left Axilla - THREE LYMPH NODES, NEGATIVE FOR TUMOR (0/3). 5. Lymph node, sentinel, biopsy, #2 left - ONE LYMPH NODE, NEGATIVE FOR TUMOR (0/1). 6. Lymph node, sentinel, biopsy, #3 left - ONE LYMPH NODE, NEGATIVE FOR TUMOR (0/1). 7. Lymph node, sentinel, biopsy, #4 left - ONE LYMPH NODE, NEGATIVE FOR TUMOR (0/1). Microscopic Comment 1. BREAST, INVASIVE TUMOR, WITH LYMPH NODE SAMPLING Specimen, including laterality and lymph node sampling (sentinel, non-sentinel): Left breast plus sentinel lymph nodes Procedure: Lumpectomy Histologic type: Ductal Grade: II of III Tubule formation: II Nuclear pleomorphism: II Mitotic:II Tumor size (gross measurement): 0.9 cm Margins: Invasive, distance to closest margin: 1 mm In-situ, distance to closest margin: N/A If margin positive, focally or broadly: N/A Lymphovascular invasion: Present Ductal carcinoma in situ: Absent Grade: N/A Extensive intraductal component: N/A Lobular neoplasia: Absent Tumor focality: Unifocal Treatment effect: None If present,  treatment effect in breast tissue, lymph nodes or both: N/A Extent of tumor: Skin: N/A Nipple: N/A Skeletal muscle: N/A Lymph nodes: Examined: 4 Sentinel 3 Non-sentinel 7 Total 2 of 4 FINAL for Veronica Rogers, Veronica Rogers [phone]) Microscopic Comment(continued) Lymph nodes with metastasis: 1 Isolated tumor cells (< 0.2 mm): N/A Micrometastasis: (> 0.2 mm and < 2.0 mm): 1 = 1 mm Macrometastasis: (> 2.0 mm): N/A Extracapsular extension: Absent Breast prognostic profile: Estrogen receptor: Not repeated, previous study demonstrated 100% positivity ([id number]) Progesterone receptor: Not repeated, previous study demonstrated 100% positivity ([id number]) Her 2 neu: Repeated, previous study demonstrated no amplification (1.85) ([id number]) Ki-67: Not repeated, previous study demonstrated 46% proliferative rate ([id number]) Non-neoplastic breast: Previous biopsy site TNM: pT1b, pN1, pMX Comments: None 2. There is no mass grossly identified. Representative section, including margins, demonstrate a nonneoplastic to include fibrocystic change, pseudoangiomatous stromal hyperplasia, and usual ductal hyperplasia. There are no atypical or malignant epithelial or stromal features present. Veronica Rogers Pathologist, Electronic Signature (Case signed 11/02/2013) Intraoperative Diagnosis RAPID INTRAOPERAS  ASSESSMENT    52 year old female with  #1 a screen detected left breast mass measuring 9 mm by ultrasound at the 11:30 o'clock position 8 cm from the nipple. She did not get an MRI. 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   #2 final pathology revealed grade 2, 0.9 cm invasive ductal carcinoma one sentinel node was positive for metastatic disease. Tumor ER positive PR positive HER-2/neu negative with an elevated Ki-67.  #3 Oncotype DX testing recurrence  score 29 giving her a 17% risk of distant recurrence at 5 years with tamoxifen alone. I have recommended adjuvant curative intent chemotherapy consisting of Taxotere Cytoxan for a total of 4 cycles. Risks benefits and side effects were discussed with the patient.  #4 patient is here today 12/ 9/ 2014 for cycle 1 day 1 of Taxotere and Cytoxan. She will receive day 2 Neulasta on 12/08/2013. She has all of her antiemetics and I have discussed how to take those.  PLAN: #1 proceed with scheduled chemotherapy today.  #2 she will return tomorrow for Neulasta injection. She knows to take Claritin and Tylenol prior to coming in for the injection to  help prevent aches and pains associated with Neulasta.  #3 she is instructed on how to take her antiemetics in detail.  #4 patient will be seen back in one week's time for interim labs.   Thank you so much for allowing me to participate in the care of Denton Brick. I will continue to follow up the patient with you and assist in her care.  All questions were answered. The patient knows to call the clinic with any problems, questions or concerns. We can certainly see the patient much sooner if necessary.  I spent 30 minutes counseling the patient face to face. The total time spent in the appointment was 30 minutes.  Drue Second, MD Medical/Oncology [facility] [phone] (beeper) [phone] (Office)

## 2013-12-07 NOTE — Progress Notes (Signed)
Put fmla form on nurse's desk °

## 2013-12-08 ENCOUNTER — Telehealth: Payer: Self-pay | Admitting: *Deleted

## 2013-12-08 ENCOUNTER — Ambulatory Visit (HOSPITAL_BASED_OUTPATIENT_CLINIC_OR_DEPARTMENT_OTHER): Payer: 59

## 2013-12-08 VITALS — BP 161/81 | HR 92 | Temp 98.1°F

## 2013-12-08 DIAGNOSIS — C50219 Malignant neoplasm of upper-inner quadrant of unspecified female breast: Secondary | ICD-10-CM

## 2013-12-08 DIAGNOSIS — C50212 Malignant neoplasm of upper-inner quadrant of left female breast: Secondary | ICD-10-CM

## 2013-12-08 DIAGNOSIS — Z5189 Encounter for other specified aftercare: Secondary | ICD-10-CM

## 2013-12-08 MED ORDER — PEGFILGRASTIM INJECTION 6 MG/0.6ML
6.0000 mg | Freq: Once | SUBCUTANEOUS | Status: AC
  Administered 2013-12-08: 6 mg via SUBCUTANEOUS
  Filled 2013-12-08: qty 0.6

## 2013-12-08 NOTE — Telephone Encounter (Signed)
Veronica Rogers here for Neulasta injection following 1st TC chemotherapy.  States that she hasn't had any problems since her treatment.  No nausea, vomiting, or diarrhea.  Is drinking lots of fluids.  All questions answered.  Knows to call if she has any problems or concerns.

## 2013-12-08 NOTE — Patient Instructions (Signed)

## 2013-12-09 ENCOUNTER — Encounter: Payer: Self-pay | Admitting: Oncology

## 2013-12-09 NOTE — Telephone Encounter (Signed)
Left message on machine for pt to return call to office. She needs to schedule a nurse visit.

## 2013-12-09 NOTE — Progress Notes (Signed)
Faxed fmla form to Brenda Jones @ [phone] °

## 2013-12-13 ENCOUNTER — Ambulatory Visit (HOSPITAL_BASED_OUTPATIENT_CLINIC_OR_DEPARTMENT_OTHER): Payer: 59 | Admitting: Oncology

## 2013-12-13 ENCOUNTER — Encounter: Payer: Self-pay | Admitting: Oncology

## 2013-12-13 VITALS — BP 122/70 | HR 116 | Temp 98.3°F | Resp 20 | Ht 68.0 in | Wt 197.8 lb

## 2013-12-13 DIAGNOSIS — C50219 Malignant neoplasm of upper-inner quadrant of unspecified female breast: Secondary | ICD-10-CM

## 2013-12-13 DIAGNOSIS — C50212 Malignant neoplasm of upper-inner quadrant of left female breast: Secondary | ICD-10-CM

## 2013-12-13 DIAGNOSIS — Z170 Estrogen receptor positive status [ER+]: Secondary | ICD-10-CM

## 2013-12-13 NOTE — Telephone Encounter (Signed)
appts made and printed...td 

## 2013-12-14 ENCOUNTER — Other Ambulatory Visit (HOSPITAL_BASED_OUTPATIENT_CLINIC_OR_DEPARTMENT_OTHER): Payer: 59

## 2013-12-14 ENCOUNTER — Other Ambulatory Visit: Payer: Self-pay | Admitting: *Deleted

## 2013-12-14 DIAGNOSIS — C50219 Malignant neoplasm of upper-inner quadrant of unspecified female breast: Secondary | ICD-10-CM

## 2013-12-14 LAB — COMPREHENSIVE METABOLIC PANEL (CC13)
ALT: 21 U/L (ref 0–55)
Anion Gap: 13 mEq/L — ABNORMAL HIGH (ref 3–11)
BUN: 9.8 mg/dL (ref 7.0–26.0)
CO2: 27 mEq/L (ref 22–29)
Calcium: 9.6 mg/dL (ref 8.4–10.4)
Chloride: 99 mEq/L (ref 98–109)
Creatinine: 0.7 mg/dL (ref 0.6–1.1)
Glucose: 212 mg/dl — ABNORMAL HIGH (ref 70–140)

## 2013-12-14 LAB — CBC WITH DIFFERENTIAL/PLATELET
BASO%: 1 % (ref 0.0–2.0)
Eosinophils Absolute: 0.1 10*3/uL (ref 0.0–0.5)
LYMPH%: 39.9 % (ref 14.0–49.7)
MCH: 26.7 pg (ref 25.1–34.0)
MCHC: 32.2 g/dL (ref 31.5–36.0)
MCV: 83 fL (ref 79.5–101.0)
MONO%: 0.2 % (ref 0.0–14.0)
Platelets: 209 10*3/uL (ref 145–400)
RBC: 4.49 10*6/uL (ref 3.70–5.45)
lymph#: 3.7 10*3/uL — ABNORMAL HIGH (ref 0.9–3.3)

## 2013-12-17 NOTE — Progress Notes (Signed)
Veronica Rogers [id number] 05/23/1961 52 y.o. 12/17/2013 1:44 AM  CC Richarda Osmond, MD [address] [place] [address]  diagnosis:  52 year old female with left breast cancer stage I. 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 Emelia Loron  Prior oncologic history:  Veronica Rogers is a 52 y.o. female.  Past medical history significant for anxiety.   #1Patient had a screening mammogram performed that revealed a left breast mass measuring 9 mm. By ultrasound it was 9 mm at the 11:30 o'clock position 8 cm from the nipple.  She did not have an MRI performed. 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 Patient's case was discussed at the multidisciplinary breast conference. She is now seen in the multidisciplinary breast clinic for discussion of treatment options. She was seen by Dr. Emelia Loron as well as Dr. Antony Blackbird.  #2 Patient is now status post lumpectomy performed on 10/29/2013. Her final pathology did reveal grade 2, 0.9 cm invasive ductal carcinoma that was ER positive PR positive. One sentinel node was positive for micrometastasis.  #3 Patient also had an Oncotype DX testing done and her recurrence score was 29 giving her a 17% risk of distant recurrence.  #4 patient began Taxotere Cytoxan on 12/07/2013 total of 4 cycles of chemotherapy is planned  Current therapy: cycle 1 day 8Taxotere Cytoxan adjuvantly  Interval history: patient returns in followup after cycle 1 of Taxotere and Cytoxan. She is accompanied by her daughter today.  Patient is tired and fatigued. She is quite tearful still about the hair loss. But she did cut her hair. Patient initially did have some nausea at home. She is recommended to take her antiemetics on a regular basis. She is denying any fevers chills or night sweats no headaches no shortness of breath no chest pains. She continues to work. She has not been having diarrhea or constipation no bleeding problems. Remainder of the 10 point review of systems is negative.  Past Medical History: Past Medical History  Diagnosis Date  . Breast cancer   . Anxiety   . Depression   . Contact lens/glasses fitting     wears contacts or glasses    Past Surgical History: Past Surgical History  Procedure Laterality Date  . Breast biopsy  1996    right breast, benign  . Wisdom tooth extraction    . Colonoscopy    . Breast lumpectomy with needle localization and axillary sentinel lymph node bx Left 10/29/2013    Procedure: BREAST LUMPECTOMY WITH NEEDLE LOCALIZATION AND AXILLARY SENTINEL LYMPH NODE BX;  Surgeon: Emelia Loron, MD;  Location: [facility];  Service: General;  Laterality: Left;    Family History: Family History  Problem Relation Age of Onset  . Squamous cell carcinoma Father   . Cancer Father   . Diabetes Father   . Hypertension Mother   . Diabetes Sister     Social History History  Substance Use Topics  . Smoking status: Never Smoker   . Smokeless tobacco: Never Used  . Alcohol Use: Yes  Comment: 1-2 drinks a month    Allergies: Allergies  Allergen Reactions  . Erythromycin Other (See Comments)    Stomach pain  . Tape Rash    Current Medications: Current Outpatient Prescriptions  Medication Sig Dispense Refill  . dexamethasone (DECADRON) 4 MG tablet Take 2 tablets (8mg s) twice a day the day before Taxotere. Then take 2 tablets (8mg s) twice a day starting the day after Taxotere for 3 days.  30 tablet  1  . lidocaine-prilocaine (EMLA) cream Apply 1  application topically as needed.  30 g  1  . LORazepam (ATIVAN) 0.5 MG tablet Take 1 tablet (0.5 mg) every 6 hours as needed for nausea or vomiting.  30 tablet  0  . ondansetron (ZOFRAN) 8 MG tablet Take two times a day starting the day after chemotherapy for 3 days. Then take two times a day as needed for nausea or vomiting.  30 tablet  1  . prochlorperazine (COMPAZINE) 10 MG tablet Take 1 tablet (10 mg total) by mouth every 6 (six) hours as needed for nausea or vomiting.  30 tablet  1  . sertraline (ZOLOFT) 50 MG tablet Take 25 mg by mouth daily.       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  1 Units  0   No current facility-administered medications for this visit.    OB/GYN History:menarche at age 29 she is premenopausal last menstrual period was on 10/13/2013 she has been on birth control pills to the present she is at first live birth at age 51.  Fertility Discussion:patient has completed her family Prior History of Cancer:no prior history  Health Maintenance:  Colonoscopy 2012 Bone Density no Last PAP smear September 2014  ECOG PERFORMANCE STATUS: 0 - Asymptomatic  Genetic Counseling/testing: no  REVIEW OF SYSTEMS:  A comprehensive review of systems was negative.  PHYSICAL EXAMINATION: Blood pressure 122/70, pulse 116, temperature 98.3 F (36.8 C), temperature source Oral, resp. rate 20, height 5\' 8"  (1.727 m), weight 197 lb 12.8 oz (89.721 kg), last menstrual period 10/04/2013.  ZOX:WRUEA, healthy, no distress, well nourished and well developed SKIN: skin color, texture, turgor are normal HEAD: Normocephalic EYES: PERRLA, EOMI, Conjunctiva are pink and non-injected EARS: External ears normal OROPHARYNX:no exudate, no erythema and lips, buccal mucosa, and tongue normal  NECK: no adenopathy, thyroid normal size, non-tender, without nodularity LYMPH:  no palpable lymphadenopathy, no hepatosplenomegaly BREAST:right breast normal without  mass, skin or nipple changes or axillary nodes, left breast normal without mass, skin or nipple changes or axillary nodes LUNGS: clear to auscultation and percussion HEART: regular rate & rhythm ABDOMEN:abdomen soft, non-tender, normal bowel sounds and no masses or organomegaly BACK: No CVA tenderness EXTREMITIES:less then 2 second capillary refill, no edema, no clubbing, no cyanosis  NEURO: alert & oriented x 3 with fluent speech, no focal motor/sensory deficits, gait normal     STUDIES/RESULTS: US Breast Left  11/03/13   CLINICAL DATA:  Patient presents for additional views of the left breast as followup to a recent screening exam suggesting a possible mass.  EXAM: DIGITAL DIAGNOSTIC UNILATERAL LEFT MAMMOGRAM LIMITED; ULTRASOUND LEFT BREAST  COMPARISON:  09/01/2012, 09/27/2011, 07/19/2010, 05/26/2009, 04/01/2008 and 02/05/2007  ACR Breast Density Category b: There are scattered areas of fibroglandular density.  FINDINGS: Spot compression views demonstrate a spiculated 1 cm mass over the slightly inner upper left breast.  Ultrasound is performed, showing an irregular bordered hypoechoic solid mass with minimal shadowing at the 11:30 position of the  left breast 8 cm from the nipple measuring 8 x 9 x 9 mm. Ultrasound of the left axilla demonstrates no abnormal appearing lymph nodes.  IMPRESSION: Suspicious 8 x 9 x 9 mm irregular bordered hypoechoic mass at the 11:30 position of the left breast 8 cm from the nipple.  RECOMMENDATION: Recommend ultrasound-guided core needle biopsy of the suspicious mass.  I have discussed the findings and recommendations with the patient. Results were also provided in writing at the conclusion of the visit. If applicable, a reminder letter will be sent to the patient regarding the next appointment.  BI-RADS CATEGORY  5: Highly suggestive of malignancy - appropriate action should be taken.  Biopsy will be done today.   Electronically Signed   By: Elberta Fortis M.D.   On:  10/11/2013 16:33   Mm Digital Diag Ltd L  10/11/2013   CLINICAL DATA:  Patient presents for additional views of the left breast as followup to a recent screening exam suggesting a possible mass.  EXAM: DIGITAL DIAGNOSTIC UNILATERAL LEFT MAMMOGRAM LIMITED; ULTRASOUND LEFT BREAST  COMPARISON:  09/01/2012, 09/27/2011, 07/19/2010, 05/26/2009, 04/01/2008 and 02/05/2007  ACR Breast Density Category b: There are scattered areas of fibroglandular density.  FINDINGS: Spot compression views demonstrate a spiculated 1 cm mass over the slightly inner upper left breast.  Ultrasound is performed, showing an irregular bordered hypoechoic solid mass with minimal shadowing at the 11:30 position of the left breast 8 cm from the nipple measuring 8 x 9 x 9 mm. Ultrasound of the left axilla demonstrates no abnormal appearing lymph nodes.  IMPRESSION: Suspicious 8 x 9 x 9 mm irregular bordered hypoechoic mass at the 11:30 position of the left breast 8 cm from the nipple.  RECOMMENDATION: Recommend ultrasound-guided core needle biopsy of the suspicious mass.  I have discussed the findings and recommendations with the patient. Results were also provided in writing at the conclusion of the visit. If applicable, a reminder letter will be sent to the patient regarding the next appointment.  BI-RADS CATEGORY  5: Highly suggestive of malignancy - appropriate action should be taken.  Biopsy will be done today.   Electronically Signed   By: Elberta Fortis M.D.   On: 10/11/2013 16:33   Mm Digital Diagnostic Unilat L  10/11/2013   CLINICAL DATA:  The patient is post ultrasound-guided core biopsy of a 9 mm spiculated mass over the 11:30 position of the left breast.  EXAM: DIGITAL DIAGNOSTIC UNILATERAL LEFT MAMMOGRAM  COMPARISON:  Previous exams  FINDINGS: Mammographic images were obtained following ultrasound guided biopsy of an irregular 9 mm mass of the 11:30 position of the left breast. Exam demonstrates satisfactory placement of a  cylindrical shaped metallic clip over the biopsied mass in the upper inner left breast.  IMPRESSION: Satisfactory clip placement post ultrasound core biopsy left breast.  Final Assessment: Post Procedure Mammograms for Marker Placement   Electronically Signed   By: Elberta Fortis M.D.   On: 10/11/2013 17:04   Mm Radiologist Eval And Mgmt  10/12/2013   EXAM: ESTABLISHED PATIENT OFFICE VISIT -LEVEL II ([address]109)  HISTORY OF PRESENT ILLNESS: Screening detected left breast mass. Ultrasound-guided core needle biopsy was performed yesterday. Pathology returned as grade 2-3 invasive ductal carcinoma.  Patient denies significant pain or bleeding at the biopsy site.  CHIEF COMPLAINT: Patient returns 1 day post ultrasound-guided core needle biopsy of a screening detected 0.9 cm mass in the upper left breast.  PHYSICAL EXAMINATION: Biopsy site in the upper left breast is clean  without evidence of bleeding from the incision. The breast is soft on palpation and there is no evidence of hematoma.  ASSESSMENT AND PLAN: 0.9 cm mass in the upper left breast, pathology from ultrasound-guided core needle biopsy revealing grade 2-3 invasive ductal carcinoma, concordant with imaging findings.  Patient is scheduled for the Multidisciplinary Breast Cancer Clinic at the [facility] on Wednesday, October 22.   Electronically Signed   By: Hulan Saas M.D.   On: 10/12/2013 17:08   [place] Lt Breast Bx W Loc Dev 1st Lesion Img Bx Spec US Guide  10/11/2013   CLINICAL DATA:  Patient presents for ultrasound-guided core needle biopsy of a 9 mm suspicious mass at the 11:30 position of the left breast 8 cm from the nipple.  EXAM: RADIOLOGY EXAMINATION  COMPARISON:  Previous exams.  PROCEDURE: I met with the patient and we discussed the procedure of ultrasound-guided biopsy, including benefits and alternatives. We discussed the high likelihood of a successful procedure. We discussed the risks of the procedure  including infection, bleeding, tissue injury, clip migration, and inadequate sampling. Informed written consent was given.  Using sterile technique and 2% Lidocaine as local anesthetic, under direct ultrasound visualization, a 12 gauge vacuum-assisteddevice was used to perform biopsy of the targeted mass at the 11:30 position using a lateral to medial approach. At the conclusion of the procedure, a top hat shaped tissue marker clip was deployed into the biopsy cavity. Follow-up 2-view mammogram was performed and dictated separately.  The usual time-out protocol was performed immediately prior to the procedure.  IMPRESSION: Ultrasound-guided biopsy of a suspicious left breast mass. No apparent complications.   Electronically Signed   By: Elberta Fortis M.D.   On: 10/11/2013 16:41     LABS:    Chemistry      Component Value Date/Time   NA 140 12/14/2013 1358   NA 140 04/18/2011 1136   K 3.4* 12/14/2013 1358   K 3.8 04/18/2011 1136   CL 103 04/18/2011 1136   CO2 27 12/14/2013 1358   CO2 28 04/18/2011 1136   BUN 9.8 12/14/2013 1358   BUN 13 04/18/2011 1136   CREATININE 0.7 12/14/2013 1358   CREATININE 0.6 04/18/2011 1136      Component Value Date/Time   CALCIUM 9.6 12/14/2013 1358   CALCIUM 9.3 04/18/2011 1136   ALKPHOS 62 12/14/2013 1358   AST 17 12/14/2013 1358   ALT 21 12/14/2013 1358   BILITOT 0.51 12/14/2013 1358      Lab Results  Component Value Date   WBC 9.2 12/14/2013   HGB 12.0 12/14/2013   HCT 37.3 12/14/2013   MCV 83.0 12/14/2013   PLT 209 12/14/2013   PATHOLOGY: ADDITIONAL INFORMATION: 1. A sample (block 1B) was sent to [facility] for Oncotype testing. The patient's recurrence score is 29. Those patients who had a recurrence score of 29 had an average rate of distant recurrence of 17%. Pecola Leisure MD Pathologist, Electronic Signature ( Signed 11/22/2013) 1. CHROMOGENIC IN-SITU HYBRIDIZATION Results: HER-2/NEU BY CISH - NO AMPLIFICATION OF HER-2  DETECTED. RESULT RATIO OF HER2: CEP 17 SIGNALS 0.95 AVERAGE HER2 COPY NUMBER PER CELL 1.95 REFERENCE RANGE NEGATIVE HER2/Chr17 Ratio <2.0 and Average HER2 copy number <4.0 EQUIVOCAL HER2/Chr17 Ratio <2.0 and Average HER2 copy number 4.0 and <6.0 POSITIVE HER2/Chr17 Ratio >=2.0 and/or Average HER2 copy number >=6.0 Pecola Leisure MD Pathologist, Electronic Signature ( Signed 11/04/2013) FINAL DIAGNOSIS Diagnosis 1. Breast, lumpectomy, Left - INVASIVE DUCTAL CARCINOMA, SEE  COMMENT. 1 of 4 FINAL for Veronica Rogers, Veronica Rogers [phone]) Diagnosis(continued) - LYMPHOVASCULAR INVASION IDENTIFIED. - INVASIVE TUMOR IS 1 MM FROM NEAREST MARGIN (MEDIAL). - PREVIOUS BIOPSY SITE IDENTIFIED. - SEE TUMOR TEMPLATE BELOW. 2. Breast, excision, left - BENIGN BREAST TISSUE, SEE COMMENT. - NEGATIVE FOR ATYPIA OR MALIGNANCY. - SURGICAL MARGINS, NEGATIVE FOR ATYPIA OR MALIGNANCY. 3. Lymph node, sentinel, biopsy, #1 left Axilla - ONE LYMPH NODE, POSITIVE FOR METASTATIC MAMMARY CARCINOMA (1/1). - TUMOR DEPOSIT IS 1 MM. - NO EXTRACAPSULAR TUMOR EXTENSION IDENTIFIED. 4. Lymph nodes, regional resection, left Axilla - THREE LYMPH NODES, NEGATIVE FOR TUMOR (0/3). 5. Lymph node, sentinel, biopsy, #2 left - ONE LYMPH NODE, NEGATIVE FOR TUMOR (0/1). 6. Lymph node, sentinel, biopsy, #3 left - ONE LYMPH NODE, NEGATIVE FOR TUMOR (0/1). 7. Lymph node, sentinel, biopsy, #4 left - ONE LYMPH NODE, NEGATIVE FOR TUMOR (0/1). Microscopic Comment 1. BREAST, INVASIVE TUMOR, WITH LYMPH NODE SAMPLING Specimen, including laterality and lymph node sampling (sentinel, non-sentinel): Left breast plus sentinel lymph nodes Procedure: Lumpectomy Histologic type: Ductal Grade: II of III Tubule formation: II Nuclear pleomorphism: II Mitotic:II Tumor size (gross measurement): 0.9 cm Margins: Invasive, distance to closest margin: 1 mm In-situ, distance to closest margin: N/A If margin positive, focally or broadly:  N/A Lymphovascular invasion: Present Ductal carcinoma in situ: Absent Grade: N/A Extensive intraductal component: N/A Lobular neoplasia: Absent Tumor focality: Unifocal Treatment effect: None If present, treatment effect in breast tissue, lymph nodes or both: N/A Extent of tumor: Skin: N/A Nipple: N/A Skeletal muscle: N/A Lymph nodes: Examined: 4 Sentinel 3 Non-sentinel 7 Total 2 of 4 FINAL for Veronica Rogers, Veronica Rogers [phone]) Microscopic Comment(continued) Lymph nodes with metastasis: 1 Isolated tumor cells (< 0.2 mm): N/A Micrometastasis: (> 0.2 mm and < 2.0 mm): 1 = 1 mm Macrometastasis: (> 2.0 mm): N/A Extracapsular extension: Absent Breast prognostic profile: Estrogen receptor: Not repeated, previous study demonstrated 100% positivity ([id number]) Progesterone receptor: Not repeated, previous study demonstrated 100% positivity ([id number]) Her 2 neu: Repeated, previous study demonstrated no amplification (1.85) ([id number]) Ki-67: Not repeated, previous study demonstrated 46% proliferative rate ([id number]) Non-neoplastic breast: Previous biopsy site TNM: pT1b, pN1, pMX Comments: None 2. There is no mass grossly identified. Representative section, including margins, demonstrate a nonneoplastic to include fibrocystic change, pseudoangiomatous stromal hyperplasia, and usual ductal hyperplasia. There are no atypical or malignant epithelial or stromal features present. [place] RUND DO Pathologist, Electronic Signature (Case signed 11/02/2013) Intraoperative Diagnosis RAPID INTRAOPERAS  ASSESSMENT    51 year old female with  #1 a screen detected left breast mass measuring 9 mm by ultrasound at the 11:30 o'clock position 8 cm from the nipple. She did not get an MRI. 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   #2 final pathology  revealed grade 2, 0.9 cm invasive ductal carcinoma one sentinel node was positive for metastatic disease. Tumor ER positive PR positive HER-2/neu negative with an elevated Ki-67.  #3 Oncotype DX testing recurrence score 29 giving her a 17% risk of distant recurrence at 5 years with tamoxifen alone. I have recommended adjuvant curative intent chemotherapy consisting of Taxotere Cytoxan for a total of 4 cycles. Risks benefits and side effects were discussed with the patient.  #4 patient is here today 12/ 9/ 2014 for cycle 1 day 1 of Taxotere and Cytoxan. She will receive day 2 Neulasta on 12/08/2013. She has all of her antiemetics and I have  discussed how to take those.  PLAN: #1 clinically patient seems to be doing well her blood counts are stable.  #2 patient will return in 2 weeks' time for cycle 2 of her treatment.  Thank you so much for allowing me to participate in the care of Denton Brick. I will continue to follow up the patient with you and assist in her care.  All questions were answered. The patient knows to call the clinic with any problems, questions or concerns. We can certainly see the patient much sooner if necessary.  I spent 20 minutes counseling the patient face to face. The total time spent in the appointment was 30 minutes.  Drue Second, MD Medical/Oncology [facility] [phone] (beeper) [phone] (Office)

## 2013-12-20 ENCOUNTER — Telehealth: Payer: Self-pay | Admitting: *Deleted

## 2013-12-20 NOTE — Telephone Encounter (Signed)
Patient called reporting she has a crack in her finger that won't heal and hurts. Applying A&D ointment and it won't heal.  Suggested neosporin and a waterproof bandage.  Reports trying this today.  Also reports her nose is extremely bloody and sore.  Noted blood when she blows her nose or simply wipes her nose.  Denies a fever.  Has used saline spray, Loratadine, Vaseline and Vicks rub in her nose with no relief and I'm sensitive to smells.  This started Monday 12-13-2013 and worsened Thursday.  The crack in my finger started from dryness and now looks like it could use a stitch.  Asked to be called at [phone].  Call originated from [phone] per caller ID.

## 2013-12-20 NOTE — Telephone Encounter (Signed)
Patient needs to go to urgent care for this.

## 2013-12-20 NOTE — Telephone Encounter (Addendum)
Verbal order received and read back from [name] for patient to go to Urgent care for her finger and continue saline nasal spray for her nose.  Spoke with patient at 45 and this information given.  She asked if there is any further instructions and none.  Verified the sores are in her nose and not at the bridge of her mouth.  Asked if there are scabs or crust that come out before the blood and this sometimes occurs.  Instructed to also notify Urgent Care of the nasal symptoms as well.  States "I don't think I'll go to the Urgent Care.  That's why I'm calling Dr. Welton Flakes.  Verbalized that she needs to go but call ended.  Appears she will not go to Urgent Care AMA.  Next F/U is 12-28-2013.

## 2013-12-28 ENCOUNTER — Ambulatory Visit (HOSPITAL_BASED_OUTPATIENT_CLINIC_OR_DEPARTMENT_OTHER): Payer: 59 | Admitting: Adult Health

## 2013-12-28 ENCOUNTER — Encounter: Payer: Self-pay | Admitting: Adult Health

## 2013-12-28 ENCOUNTER — Ambulatory Visit (HOSPITAL_BASED_OUTPATIENT_CLINIC_OR_DEPARTMENT_OTHER): Payer: 59

## 2013-12-28 ENCOUNTER — Other Ambulatory Visit: Payer: 59

## 2013-12-28 ENCOUNTER — Other Ambulatory Visit: Payer: Self-pay | Admitting: Emergency Medicine

## 2013-12-28 ENCOUNTER — Other Ambulatory Visit (HOSPITAL_BASED_OUTPATIENT_CLINIC_OR_DEPARTMENT_OTHER): Payer: 59

## 2013-12-28 ENCOUNTER — Ambulatory Visit: Payer: 59 | Admitting: Lab

## 2013-12-28 VITALS — BP 152/85 | HR 83 | Temp 98.2°F | Resp 18 | Ht 68.0 in | Wt 200.5 lb

## 2013-12-28 DIAGNOSIS — C50212 Malignant neoplasm of upper-inner quadrant of left female breast: Secondary | ICD-10-CM

## 2013-12-28 DIAGNOSIS — C50219 Malignant neoplasm of upper-inner quadrant of unspecified female breast: Secondary | ICD-10-CM

## 2013-12-28 DIAGNOSIS — Z170 Estrogen receptor positive status [ER+]: Secondary | ICD-10-CM

## 2013-12-28 DIAGNOSIS — C773 Secondary and unspecified malignant neoplasm of axilla and upper limb lymph nodes: Secondary | ICD-10-CM

## 2013-12-28 DIAGNOSIS — Z5111 Encounter for antineoplastic chemotherapy: Secondary | ICD-10-CM

## 2013-12-28 LAB — COMPREHENSIVE METABOLIC PANEL (CC13)
ALT: 28 U/L (ref 0–55)
Albumin: 3.8 g/dL (ref 3.5–5.0)
Alkaline Phosphatase: 53 U/L (ref 40–150)
Chloride: 99 mEq/L (ref 98–109)
Creatinine: 0.8 mg/dL (ref 0.6–1.1)
Glucose: 411 mg/dl — ABNORMAL HIGH (ref 70–140)
Potassium: 4.3 mEq/L (ref 3.5–5.1)
Sodium: 135 mEq/L — ABNORMAL LOW (ref 136–145)
Total Bilirubin: 0.68 mg/dL (ref 0.20–1.20)
Total Protein: 6.9 g/dL (ref 6.4–8.3)

## 2013-12-28 LAB — CBC WITH DIFFERENTIAL/PLATELET
Basophils Absolute: 0.1 10*3/uL (ref 0.0–0.1)
EOS%: 0.6 % (ref 0.0–7.0)
Eosinophils Absolute: 0.1 10*3/uL (ref 0.0–0.5)
HCT: 35.3 % (ref 34.8–46.6)
HGB: 11.6 g/dL (ref 11.6–15.9)
MCH: 27.2 pg (ref 25.1–34.0)
MONO%: 2.7 % (ref 0.0–14.0)
NEUT#: 6.7 10*3/uL — ABNORMAL HIGH (ref 1.5–6.5)
NEUT%: 75.8 % (ref 38.4–76.8)
RBC: 4.27 10*6/uL (ref 3.70–5.45)
RDW: 13.8 % (ref 11.2–14.5)
WBC: 8.9 10*3/uL (ref 3.9–10.3)
lymph#: 1.8 10*3/uL (ref 0.9–3.3)
nRBC: 0 % (ref 0–0)

## 2013-12-28 MED ORDER — SODIUM CHLORIDE 0.9 % IJ SOLN
10.0000 mL | INTRAMUSCULAR | Status: DC | PRN
  Administered 2013-12-28: 10 mL
  Filled 2013-12-28: qty 10

## 2013-12-28 MED ORDER — DEXAMETHASONE SODIUM PHOSPHATE 20 MG/5ML IJ SOLN
INTRAMUSCULAR | Status: AC
  Filled 2013-12-28: qty 5

## 2013-12-28 MED ORDER — ONDANSETRON 16 MG/50ML IVPB (CHCC)
16.0000 mg | Freq: Once | INTRAVENOUS | Status: AC
  Administered 2013-12-28: 16 mg via INTRAVENOUS

## 2013-12-28 MED ORDER — SODIUM CHLORIDE 0.9 % IV SOLN
Freq: Once | INTRAVENOUS | Status: AC
  Administered 2013-12-28: 16:00:00 via INTRAVENOUS

## 2013-12-28 MED ORDER — SODIUM CHLORIDE 0.9 % IV SOLN
75.0000 mg/m2 | Freq: Once | INTRAVENOUS | Status: AC
  Administered 2013-12-28: 160 mg via INTRAVENOUS
  Filled 2013-12-28: qty 16

## 2013-12-28 MED ORDER — HEPARIN SOD (PORK) LOCK FLUSH 100 UNIT/ML IV SOLN
500.0000 [IU] | Freq: Once | INTRAVENOUS | Status: AC | PRN
  Administered 2013-12-28: 500 [IU]
  Filled 2013-12-28: qty 5

## 2013-12-28 MED ORDER — ONDANSETRON 16 MG/50ML IVPB (CHCC)
INTRAVENOUS | Status: AC
  Filled 2013-12-28: qty 16

## 2013-12-28 MED ORDER — DEXAMETHASONE SODIUM PHOSPHATE 20 MG/5ML IJ SOLN
20.0000 mg | Freq: Once | INTRAMUSCULAR | Status: AC
  Administered 2013-12-28: 20 mg via INTRAVENOUS

## 2013-12-28 MED ORDER — SODIUM CHLORIDE 0.9 % IV SOLN
600.0000 mg/m2 | Freq: Once | INTRAVENOUS | Status: AC
  Administered 2013-12-28: 1240 mg via INTRAVENOUS
  Filled 2013-12-28: qty 62

## 2013-12-28 NOTE — Progress Notes (Signed)
Veronica Rogers [id number] 1961-01-01 52 y.o. 12/30/2013 9:24 AM  CC Veronica Osmond, MD [address] [place] [address]  DIAGNOSIS:  52 year old female with left breast cancer stage IA.     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 Emelia Loron  PRIOR ONCOLOGIC HISTORY:  Veronica Rogers is a 52 y.o. female.  Past medical history significant for anxiety.   #1Patient had a screening mammogram performed that revealed a left breast mass measuring 9 mm. By ultrasound it was 9 mm at the 11:30 o'clock position 8 cm from the nipple.  She did not have an MRI performed. 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 Patient's case was discussed at the multidisciplinary breast conference. She is now seen in the multidisciplinary breast clinic for discussion of treatment options. She was seen by Dr. Emelia Loron as well as Dr. Antony Blackbird.  #2 Patient is now status post lumpectomy performed on 10/29/2013. Her final pathology did reveal grade 2, 0.9 cm invasive ductal carcinoma that was ER positive PR positive. One sentinel node was positive for micrometastasis.  #3 Patient also had an Oncotype DX testing done and her recurrence score was 29 giving her a 17% risk of distant recurrence.  #4 patient began Taxotere Cytoxan on 12/07/2013 total of 4 cycles of chemotherapy is planned  CURRENT THERAPY: cycle 2 day 1 Taxotere Cytoxan adjuvantly  INTERVAL HISTORY: Patient is here for evaluation prior to receiving her second cycle of Taxotere/Cytoxan.  She is doing well today.  She is reporting that after her first cycle she experienced mouth pain and  tenderness and would like another mouthwash.  She denies fevers, chills, nausea, vomiting, constipation, diarrhea, numbness, or any further concerns.    Past Medical History: Past Medical History  Diagnosis Date  . Breast cancer   . Anxiety   . Depression   . Contact lens/glasses fitting     wears contacts or glasses    Past Surgical History: Past Surgical History  Procedure Laterality Date  . Breast biopsy  1996    right breast, benign  . Wisdom tooth extraction    . Colonoscopy    . Breast lumpectomy with needle localization and axillary sentinel lymph node bx Left 10/29/2013    Procedure: BREAST LUMPECTOMY WITH NEEDLE LOCALIZATION AND AXILLARY SENTINEL LYMPH NODE BX;  Surgeon: Emelia Loron, MD;  Location: [facility];  Service: General;  Laterality: Left;    Family History: Family History  Problem Relation Age of Onset  . Squamous cell carcinoma Father   . Cancer Father   . Diabetes Father   . Hypertension Mother   . Diabetes Sister     Social History History  Substance Use Topics  . Smoking status: Never Smoker   . Smokeless tobacco: Never Used  . Alcohol Use: Yes     Comment: 1-2 drinks a month    Allergies: Allergies  Allergen Reactions  . Erythromycin Other (See Comments)    Stomach pain  . Tape Rash    Current Medications: Current Outpatient Prescriptions  Medication Sig Dispense Refill  . dexamethasone (DECADRON) 4 MG tablet Take 2 tablets (8mg s)  twice a day the day before Taxotere. Then take 2 tablets (8mg s) twice a day starting the day after Taxotere for 3 days.  30 tablet  1  . lidocaine-prilocaine (EMLA) cream Apply 1 application topically as needed.  30 g  1  . LORazepam (ATIVAN) 0.5 MG tablet Take 1 tablet (0.5 mg) every 6 hours as needed for nausea or vomiting.  30 tablet  0  . ondansetron (ZOFRAN) 8 MG tablet Take two times a day starting the day after chemotherapy for 3 days. Then take two times a day as needed for nausea or  vomiting.  30 tablet  1  . prochlorperazine (COMPAZINE) 10 MG tablet Take 1 tablet (10 mg total) by mouth every 6 (six) hours as needed for nausea or vomiting.  30 tablet  1  . sertraline (ZOLOFT) 50 MG tablet Take 25 mg by mouth daily.       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  1 Units  0   No current facility-administered medications for this visit.   REVIEW OF SYSTEMS: A 10 point review of systems was conducted and is otherwise negative except for what is noted above.    PHYSICAL EXAMINATION: Blood pressure 152/85, pulse 83, temperature 98.2 F (36.8 C), temperature source Oral, resp. rate 18, height 5\' 8"  (1.727 m), weight 200 lb 8 oz (90.946 kg), last menstrual period 10/04/2013. GENERAL: Patient is a well appearing female in no acute distress HEENT:  Sclerae anicteric.  Oropharynx clear and moist. No ulcerations or evidence of oropharyngeal candidiasis. Neck is supple.  NODES:  No cervical, supraclavicular, or axillary lymphadenopathy palpated.  BREAST EXAM:  Deferred. LUNGS:  Clear to auscultation bilaterally.  No wheezes or rhonchi. HEART:  Regular rate and rhythm. No murmur appreciated. ABDOMEN:  Soft, nontender.  Positive, normoactive bowel sounds. No organomegaly palpated. MSK:  No focal spinal tenderness to palpation. Full range of motion bilaterally in the upper extremities. EXTREMITIES:  No peripheral edema.   SKIN:  Clear with no obvious rashes or skin changes. No nail dyscrasia. NEURO:  Nonfocal. Well oriented.  Appropriate affect. ECOG PERFORMANCE STATUS: 0 - Asymptomatic   STUDIES/RESULTS: US Breast Left  October 20, 2013   CLINICAL DATA:  Patient presents for additional views of the left breast as followup to a recent screening exam suggesting a possible mass.  EXAM: DIGITAL DIAGNOSTIC UNILATERAL LEFT MAMMOGRAM LIMITED; ULTRASOUND LEFT BREAST  COMPARISON:  09/01/2012, 09/27/2011, 07/19/2010, 05/26/2009, 04/01/2008 and 02/05/2007  ACR  Breast Density Category b: There are scattered areas of fibroglandular density.  FINDINGS: Spot compression views demonstrate a spiculated 1 cm mass over the slightly inner upper left breast.  Ultrasound is performed, showing an irregular bordered hypoechoic solid mass with minimal shadowing at the 11:30 position of the left breast 8 cm from the nipple measuring 8 x 9 x 9 mm. Ultrasound of the left axilla demonstrates no abnormal appearing lymph nodes.  IMPRESSION: Suspicious 8 x 9 x 9 mm irregular bordered hypoechoic mass at the 11:30 position of the left breast 8 cm from the nipple.  RECOMMENDATION: Recommend ultrasound-guided core needle biopsy of the suspicious mass.  I have discussed the findings and recommendations with the patient. Results were also provided in writing at the conclusion of the visit. If applicable, a reminder letter will be sent to the patient regarding the next appointment.  BI-RADS CATEGORY  5: Highly suggestive of malignancy - appropriate action should be taken.  Biopsy will be done today.   Electronically  Signed   By: Elberta Fortis M.D.   On: 10/11/2013 16:33   Mm Digital Diag Ltd L  10/11/2013   CLINICAL DATA:  Patient presents for additional views of the left breast as followup to a recent screening exam suggesting a possible mass.  EXAM: DIGITAL DIAGNOSTIC UNILATERAL LEFT MAMMOGRAM LIMITED; ULTRASOUND LEFT BREAST  COMPARISON:  09/01/2012, 09/27/2011, 07/19/2010, 05/26/2009, 04/01/2008 and 02/05/2007  ACR Breast Density Category b: There are scattered areas of fibroglandular density.  FINDINGS: Spot compression views demonstrate a spiculated 1 cm mass over the slightly inner upper left breast.  Ultrasound is performed, showing an irregular bordered hypoechoic solid mass with minimal shadowing at the 11:30 position of the left breast 8 cm from the nipple measuring 8 x 9 x 9 mm. Ultrasound of the left axilla demonstrates no abnormal appearing lymph nodes.  IMPRESSION: Suspicious 8 x 9  x 9 mm irregular bordered hypoechoic mass at the 11:30 position of the left breast 8 cm from the nipple.  RECOMMENDATION: Recommend ultrasound-guided core needle biopsy of the suspicious mass.  I have discussed the findings and recommendations with the patient. Results were also provided in writing at the conclusion of the visit. If applicable, a reminder letter will be sent to the patient regarding the next appointment.  BI-RADS CATEGORY  5: Highly suggestive of malignancy - appropriate action should be taken.  Biopsy will be done today.   Electronically Signed   By: Elberta Fortis M.D.   On: 10/11/2013 16:33   Mm Digital Diagnostic Unilat L  10/11/2013   CLINICAL DATA:  The patient is post ultrasound-guided core biopsy of a 9 mm spiculated mass over the 11:30 position of the left breast.  EXAM: DIGITAL DIAGNOSTIC UNILATERAL LEFT MAMMOGRAM  COMPARISON:  Previous exams  FINDINGS: Mammographic images were obtained following ultrasound guided biopsy of an irregular 9 mm mass of the 11:30 position of the left breast. Exam demonstrates satisfactory placement of a cylindrical shaped metallic clip over the biopsied mass in the upper inner left breast.  IMPRESSION: Satisfactory clip placement post ultrasound core biopsy left breast.  Final Assessment: Post Procedure Mammograms for Marker Placement   Electronically Signed   By: Elberta Fortis M.D.   On: 10/11/2013 17:04   Mm Radiologist Eval And Mgmt  10/12/2013   EXAM: ESTABLISHED PATIENT OFFICE VISIT -LEVEL II (16109)  HISTORY OF PRESENT ILLNESS: Screening detected left breast mass. Ultrasound-guided core needle biopsy was performed yesterday. Pathology returned as grade 2-3 invasive ductal carcinoma.  Patient denies significant pain or bleeding at the biopsy site.  CHIEF COMPLAINT: Patient returns 1 day post ultrasound-guided core needle biopsy of a screening detected 0.9 cm mass in the upper left breast.  PHYSICAL EXAMINATION: Biopsy site in the upper left breast  is clean without evidence of bleeding from the incision. The breast is soft on palpation and there is no evidence of hematoma.  ASSESSMENT AND PLAN: 0.9 cm mass in the upper left breast, pathology from ultrasound-guided core needle biopsy revealing grade 2-3 invasive ductal carcinoma, concordant with imaging findings.  Patient is scheduled for the Multidisciplinary Breast Cancer Clinic at the [facility] on Wednesday, October 22.   Electronically Signed   By: Hulan Saas M.D.   On: 10/12/2013 17:08   [place] Lt Breast Bx W Loc Dev 1st Lesion Img Bx Spec US Guide  10/11/2013   CLINICAL DATA:  Patient presents for ultrasound-guided core needle biopsy of a 9 mm suspicious mass at  the 11:30 position of the left breast 8 cm from the nipple.  EXAM: RADIOLOGY EXAMINATION  COMPARISON:  Previous exams.  PROCEDURE: I met with the patient and we discussed the procedure of ultrasound-guided biopsy, including benefits and alternatives. We discussed the high likelihood of a successful procedure. We discussed the risks of the procedure including infection, bleeding, tissue injury, clip migration, and inadequate sampling. Informed written consent was given.  Using sterile technique and 2% Lidocaine as local anesthetic, under direct ultrasound visualization, a 12 gauge vacuum-assisteddevice was used to perform biopsy of the targeted mass at the 11:30 position using a lateral to medial approach. At the conclusion of the procedure, a top hat shaped tissue marker clip was deployed into the biopsy cavity. Follow-up 2-view mammogram was performed and dictated separately.  The usual time-out protocol was performed immediately prior to the procedure.  IMPRESSION: Ultrasound-guided biopsy of a suspicious left breast mass. No apparent complications.   Electronically Signed   By: Elberta Fortis M.D.   On: 10/11/2013 16:41     LABS:    Chemistry      Component Value Date/Time   NA 135*  12/28/2013 1032   NA 140 04/18/2011 1136   K 4.3 12/28/2013 1032   K 3.8 04/18/2011 1136   CL 103 04/18/2011 1136   CO2 18* 12/28/2013 1032   CO2 28 04/18/2011 1136   BUN 13.8 12/28/2013 1032   BUN 13 04/18/2011 1136   CREATININE 0.8 12/28/2013 1032   CREATININE 0.6 04/18/2011 1136      Component Value Date/Time   CALCIUM 9.9 12/28/2013 1032   CALCIUM 9.3 04/18/2011 1136   ALKPHOS 53 12/28/2013 1032   AST 15 12/28/2013 1032   ALT 28 12/28/2013 1032   BILITOT 0.68 12/28/2013 1032      Lab Results  Component Value Date   WBC 8.9 12/28/2013   HGB 11.6 12/28/2013   HCT 35.3 12/28/2013   MCV 82.7 12/28/2013   PLT 271 12/28/2013   PATHOLOGY: ADDITIONAL INFORMATION: 1. A sample (block 1B) was sent to [facility] for Oncotype testing. The patient's recurrence score is 29. Those patients who had a recurrence score of 29 had an average rate of distant recurrence of 17%. Pecola Leisure MD Pathologist, Electronic Signature ( Signed 11/22/2013) 1. CHROMOGENIC IN-SITU HYBRIDIZATION Results: HER-2/NEU BY CISH - NO AMPLIFICATION OF HER-2 DETECTED. RESULT RATIO OF HER2: CEP 17 SIGNALS 0.95 AVERAGE HER2 COPY NUMBER PER CELL 1.95 REFERENCE RANGE NEGATIVE HER2/Chr17 Ratio <2.0 and Average HER2 copy number <4.0 EQUIVOCAL HER2/Chr17 Ratio <2.0 and Average HER2 copy number 4.0 and <6.0 POSITIVE HER2/Chr17 Ratio >=2.0 and/or Average HER2 copy number >=6.0 Pecola Leisure MD Pathologist, Electronic Signature ( Signed 11/04/2013) FINAL DIAGNOSIS Diagnosis 1. Breast, lumpectomy, Left - INVASIVE DUCTAL CARCINOMA, SEE COMMENT. 1 of 4 FINAL for Veronica, Rogers [phone]) Diagnosis(continued) - LYMPHOVASCULAR INVASION IDENTIFIED. - INVASIVE TUMOR IS 1 MM FROM NEAREST MARGIN (MEDIAL). - PREVIOUS BIOPSY SITE IDENTIFIED. - SEE TUMOR TEMPLATE BELOW. 2. Breast, excision, left - BENIGN BREAST TISSUE, SEE COMMENT. - NEGATIVE FOR ATYPIA OR MALIGNANCY. - SURGICAL MARGINS, NEGATIVE FOR ATYPIA OR  MALIGNANCY. 3. Lymph node, sentinel, biopsy, #1 left Axilla - ONE LYMPH NODE, POSITIVE FOR METASTATIC MAMMARY CARCINOMA (1/1). - TUMOR DEPOSIT IS 1 MM. - NO EXTRACAPSULAR TUMOR EXTENSION IDENTIFIED. 4. Lymph nodes, regional resection, left Axilla - THREE LYMPH NODES, NEGATIVE FOR TUMOR (0/3). 5. Lymph node, sentinel, biopsy, #2 left - ONE LYMPH NODE, NEGATIVE FOR TUMOR (0/1). 6. Lymph node, sentinel, biopsy, #  3 left - ONE LYMPH NODE, NEGATIVE FOR TUMOR (0/1). 7. Lymph node, sentinel, biopsy, #4 left - ONE LYMPH NODE, NEGATIVE FOR TUMOR (0/1). Microscopic Comment 1. BREAST, INVASIVE TUMOR, WITH LYMPH NODE SAMPLING Specimen, including laterality and lymph node sampling (sentinel, non-sentinel): Left breast plus sentinel lymph nodes Procedure: Lumpectomy Histologic type: Ductal Grade: II of III Tubule formation: II Nuclear pleomorphism: II Mitotic:II Tumor size (gross measurement): 0.9 cm Margins: Invasive, distance to closest margin: 1 mm In-situ, distance to closest margin: N/A If margin positive, focally or broadly: N/A Lymphovascular invasion: Present Ductal carcinoma in situ: Absent Grade: N/A Extensive intraductal component: N/A Lobular neoplasia: Absent Tumor focality: Unifocal Treatment effect: None If present, treatment effect in breast tissue, lymph nodes or both: N/A Extent of tumor: Skin: N/A Nipple: N/A Skeletal muscle: N/A Lymph nodes: Examined: 4 Sentinel 3 Non-sentinel 7 Total 2 of 4 FINAL for Veronica, Rogers [phone]) Microscopic Comment(continued) Lymph nodes with metastasis: 1 Isolated tumor cells (< 0.2 mm): N/A Micrometastasis: (> 0.2 mm and < 2.0 mm): 1 = 1 mm Macrometastasis: (> 2.0 mm): N/A Extracapsular extension: Absent Breast prognostic profile: Estrogen receptor: Not repeated, previous study demonstrated 100% positivity ([id number]) Progesterone receptor: Not repeated, previous study demonstrated 100% positivity ([id number]) Her 2  neu: Repeated, previous study demonstrated no amplification (1.85) ([id number]) Ki-67: Not repeated, previous study demonstrated 46% proliferative rate ([id number]) Non-neoplastic breast: Previous biopsy site TNM: pT1b, pN1, pMX Comments: None 2. There is no mass grossly identified. Representative section, including margins, demonstrate a nonneoplastic to include fibrocystic change, pseudoangiomatous stromal hyperplasia, and usual ductal hyperplasia. There are no atypical or malignant epithelial or stromal features present. [place] RUND DO Pathologist, Electronic Signature (Case signed 11/02/2013) Intraoperative Diagnosis RAPID INTRAOPERAS  ASSESSMENT    52 year old female with  #1 a screen detected left breast mass measuring 9 mm by ultrasound at the 11:30 o'clock position 8 cm from the nipple. She did not get an MRI. 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   #2 final pathology revealed grade 2, 0.9 cm invasive ductal carcinoma one sentinel node was positive for metastatic disease. Tumor ER positive PR positive HER-2/neu negative with an elevated Ki-67.  #3 Oncotype DX testing recurrence score 29 giving her a 17% risk of distant recurrence at 5 years with tamoxifen alone. I have recommended adjuvant curative intent chemotherapy consisting of Taxotere Cytoxan for a total of 4 cycles. Risks benefits and side effects were discussed with the patient.  #4 patient is here today for cycle 2 day 1 of Taxotere and Cytoxan. She will receive day 2 Neulasta tomorrow. She has all of her antiemetics and I again reviewed how to take those.  PLAN: #1 Patient is doing well today.  I reviewed her CBC with her in detail.  It is stable.  Her CMP is pending.  She will proceed with chemotherapy.    #2 I will prescribe magic mouthwash for the patient's sore mouth and  tenderness following chemotherapy at her request.    #3 She will return tomorrow for Neulasta and in one week for labs and evaluation.    All questions were answered. The patient knows to call the clinic with any problems, questions or concerns. We can certainly see the patient much sooner if necessary.  I spent 25 minutes counseling the patient face to face. The total time spent in the appointment was 30 minutes.  Illa Level, NP Medical Oncology [facility] [phone]

## 2013-12-28 NOTE — Patient Instructions (Signed)
Cancer Center Discharge Instructions for Patients Receiving Chemotherapy  Today you received the following chemotherapy agents Taxotere and Cytoxan.  To help prevent nausea and vomiting after your treatment, we encourage you to take your nausea medication Compazine 10 mg every 6 hours as needed. If you develop nausea and vomiting that is not controlled by your nausea medication, call the clinic.   BELOW ARE SYMPTOMS THAT SHOULD BE REPORTED IMMEDIATELY:  *FEVER GREATER THAN 100.5 F  *CHILLS WITH OR WITHOUT FEVER  NAUSEA AND VOMITING THAT IS NOT CONTROLLED WITH YOUR NAUSEA MEDICATION  *UNUSUAL SHORTNESS OF BREATH  *UNUSUAL BRUISING OR BLEEDING  TENDERNESS IN MOUTH AND THROAT WITH OR WITHOUT PRESENCE OF ULCERS  *URINARY PROBLEMS  *BOWEL PROBLEMS  UNUSUAL RASH Items with * indicate a potential emergency and should be followed up as soon as possible.  Feel free to call the clinic you have any questions or concerns. The clinic phone number is [phone].    

## 2013-12-29 ENCOUNTER — Ambulatory Visit (HOSPITAL_BASED_OUTPATIENT_CLINIC_OR_DEPARTMENT_OTHER): Payer: 59

## 2013-12-29 VITALS — BP 164/81 | HR 93 | Temp 98.2°F

## 2013-12-29 DIAGNOSIS — C50219 Malignant neoplasm of upper-inner quadrant of unspecified female breast: Secondary | ICD-10-CM

## 2013-12-29 DIAGNOSIS — Z5189 Encounter for other specified aftercare: Secondary | ICD-10-CM

## 2013-12-29 DIAGNOSIS — C50212 Malignant neoplasm of upper-inner quadrant of left female breast: Secondary | ICD-10-CM

## 2013-12-29 MED ORDER — PEGFILGRASTIM INJECTION 6 MG/0.6ML
6.0000 mg | Freq: Once | SUBCUTANEOUS | Status: AC
  Administered 2013-12-29: 6 mg via SUBCUTANEOUS
  Filled 2013-12-29: qty 0.6

## 2013-12-31 ENCOUNTER — Telehealth: Payer: Self-pay

## 2013-12-31 NOTE — Telephone Encounter (Signed)
Agreed.  Thank you

## 2013-12-31 NOTE — Telephone Encounter (Signed)
Pt left v/m pt wanted to let Dr Deborra Medina know pt had test results on 12/29/13 at chemo treatment and BS was 411 and 12/30/2013 BS 341. Pt has appt with Webb Silversmith NP on 01/04/14 and pt also wants Dr Deborra Medina wants to know this info. Pt request cb today.Please advise.

## 2013-12-31 NOTE — Telephone Encounter (Signed)
Triage Record Num: [id number] Operator: April Gaither Patient Name: Veronica Rogers Call Date & Time: 12/29/2013 6:50:50PM Patient Phone: [phone] PCP: Patient Gender: Female PCP Fax : Patient DOB: 12-08-1961 [facility] Name: [facility] Reason for Call: Caller: [name]/Patient; PCP: Arnette Norris (Family Practice); CB#: [phone]; Call regarding Questions related to staff nurse message; Patient states that she was called by staff nurse today and blood glucose was 411. Staff nurse informed her to monitor it and to report any changes in status. Patient inquiring about what sxs she should be looking for; Information reviewed with patient; Denies sxs currently; Call back parameters discussed. Protocol(s) Used: Office Note Recommended Outcome per Protocol: Information Noted and Sent to Office Reason for Outcome: Caller information to office Care Advice: ~

## 2013-12-31 NOTE — Telephone Encounter (Signed)
She needs to keep a close eye on it. If it remains > 200, she needs to be seen before tuesday

## 2014-01-04 ENCOUNTER — Other Ambulatory Visit: Payer: Self-pay | Admitting: Emergency Medicine

## 2014-01-04 ENCOUNTER — Ambulatory Visit (HOSPITAL_BASED_OUTPATIENT_CLINIC_OR_DEPARTMENT_OTHER): Payer: 59 | Admitting: Oncology

## 2014-01-04 ENCOUNTER — Other Ambulatory Visit (HOSPITAL_BASED_OUTPATIENT_CLINIC_OR_DEPARTMENT_OTHER): Payer: 59

## 2014-01-04 ENCOUNTER — Ambulatory Visit (INDEPENDENT_AMBULATORY_CARE_PROVIDER_SITE_OTHER): Payer: 59 | Admitting: Internal Medicine

## 2014-01-04 ENCOUNTER — Telehealth: Payer: Self-pay | Admitting: *Deleted

## 2014-01-04 ENCOUNTER — Encounter: Payer: Self-pay | Admitting: Oncology

## 2014-01-04 ENCOUNTER — Telehealth: Payer: Self-pay | Admitting: Oncology

## 2014-01-04 VITALS — BP 142/82 | HR 98 | Temp 98.2°F | Wt 192.2 lb

## 2014-01-04 VITALS — BP 134/80 | HR 105 | Temp 98.4°F | Resp 18 | Ht 68.0 in | Wt 193.8 lb

## 2014-01-04 DIAGNOSIS — R739 Hyperglycemia, unspecified: Secondary | ICD-10-CM

## 2014-01-04 DIAGNOSIS — C50212 Malignant neoplasm of upper-inner quadrant of left female breast: Secondary | ICD-10-CM

## 2014-01-04 DIAGNOSIS — C50219 Malignant neoplasm of upper-inner quadrant of unspecified female breast: Secondary | ICD-10-CM

## 2014-01-04 DIAGNOSIS — R7309 Other abnormal glucose: Secondary | ICD-10-CM

## 2014-01-04 DIAGNOSIS — Z170 Estrogen receptor positive status [ER+]: Secondary | ICD-10-CM

## 2014-01-04 DIAGNOSIS — T380X5A Adverse effect of glucocorticoids and synthetic analogues, initial encounter: Principal | ICD-10-CM

## 2014-01-04 LAB — CBC WITH DIFFERENTIAL/PLATELET
BASO%: 0.6 % (ref 0.0–2.0)
BASOS ABS: 0 10*3/uL (ref 0.0–0.1)
EOS%: 3.6 % (ref 0.0–7.0)
Eosinophils Absolute: 0.2 10*3/uL (ref 0.0–0.5)
HCT: 37 % (ref 34.8–46.6)
HEMOGLOBIN: 12.4 g/dL (ref 11.6–15.9)
LYMPH%: 51.5 % — AB (ref 14.0–49.7)
MCH: 27.9 pg (ref 25.1–34.0)
MCHC: 33.6 g/dL (ref 31.5–36.0)
MCV: 83.1 fL (ref 79.5–101.0)
MONO#: 0.4 10*3/uL (ref 0.1–0.9)
MONO%: 6.3 % (ref 0.0–14.0)
NEUT#: 2.4 10*3/uL (ref 1.5–6.5)
NEUT%: 38 % — AB (ref 38.4–76.8)
PLATELETS: 241 10*3/uL (ref 145–400)
RBC: 4.46 10*6/uL (ref 3.70–5.45)
RDW: 13.5 % (ref 11.2–14.5)
WBC: 6.3 10*3/uL (ref 3.9–10.3)
lymph#: 3.2 10*3/uL (ref 0.9–3.3)

## 2014-01-04 LAB — COMPREHENSIVE METABOLIC PANEL (CC13)
ALK PHOS: 72 U/L (ref 40–150)
ALT: 27 U/L (ref 0–55)
AST: 14 U/L (ref 5–34)
Albumin: 3.9 g/dL (ref 3.5–5.0)
Anion Gap: 16 mEq/L — ABNORMAL HIGH (ref 3–11)
BILIRUBIN TOTAL: 0.7 mg/dL (ref 0.20–1.20)
BUN: 21 mg/dL (ref 7.0–26.0)
CO2: 22 mEq/L (ref 22–29)
CREATININE: 0.9 mg/dL (ref 0.6–1.1)
Calcium: 9.7 mg/dL (ref 8.4–10.4)
Chloride: 98 mEq/L (ref 98–109)
Glucose: 344 mg/dl — ABNORMAL HIGH (ref 70–140)
Potassium: 3.6 mEq/L (ref 3.5–5.1)
Sodium: 136 mEq/L (ref 136–145)
Total Protein: 6.8 g/dL (ref 6.4–8.3)

## 2014-01-04 MED ORDER — INSULIN GLARGINE 100 UNIT/ML ~~LOC~~ SOLN: 18.0000 [IU] | Freq: Every day | SUBCUTANEOUS | Status: DC

## 2014-01-04 NOTE — Patient Instructions (Signed)

## 2014-01-04 NOTE — Progress Notes (Signed)
Subjective:    Patient ID: Veronica Rogers, female    DOB: 1961-07-01, 53 y.o.   MRN: [id number]  HPI  Pt presents to the clinic today with c/o elevated blood sugars. She found this out when she went to chemo and they drew her blood. It has been as high as 411. Today her level was 344. She has no history of diabetes. She is currently undergoing treatment of breast cancer. She is on Decadron with each round of chemo. She finishes up chemo in Feb 2015. She denies polyuria, polydipsia, skin changes or numbness and tingling in her hands and feet.  Review of Systems      Past Medical History  Diagnosis Date  . Breast cancer   . Anxiety   . Depression   . Contact lens/glasses fitting     wears contacts or glasses    Current Outpatient Prescriptions  Medication Sig Dispense Refill  . dexamethasone (DECADRON) 4 MG tablet Take 2 tablets (8mg s) twice a day the day before Taxotere. Then take 2 tablets (8mg s) twice a day starting the day after Taxotere for 3 days.  30 tablet  1  . lidocaine-prilocaine (EMLA) cream Apply 1 application topically as needed.  30 g  1  . LORazepam (ATIVAN) 0.5 MG tablet Take 1 tablet (0.5 mg) every 6 hours as needed for nausea or vomiting.  30 tablet  0  . ondansetron (ZOFRAN) 8 MG tablet Take two times a day starting the day after chemotherapy for 3 days. Then take two times a day as needed for nausea or vomiting.  30 tablet  1  . prochlorperazine (COMPAZINE) 10 MG tablet Take 1 tablet (10 mg total) by mouth every 6 (six) hours as needed for nausea or vomiting.  30 tablet  1  . sertraline (ZOLOFT) 50 MG tablet Take 25 mg by mouth daily.       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  1 Units  0   No current facility-administered medications for this visit.    Allergies  Allergen Reactions  . Erythromycin Other (See Comments)    Stomach pain  . Tape Rash    Family History  Problem Relation Age of Onset  . Squamous cell  carcinoma Father   . Cancer Father   . Diabetes Father   . Hypertension Mother   . Diabetes Sister     History   Social History  . Marital Status: Married    Spouse Name: N/A    Number of Children: N/A  . Years of Education: N/A   Occupational History  . Not on file.   Social History Main Topics  . Smoking status: Never Smoker   . Smokeless tobacco: Never Used  . Alcohol Use: Yes     Comment: 1-2 drinks a month  . Drug Use: No  . Sexual Activity: Yes   Other Topics Concern  . Not on file   Social History Narrative  . No narrative on file     Constitutional: Denies fever, malaise, fatigue, headache or abrupt weight changes.  Respiratory: Denies difficulty breathing, shortness of breath, cough or sputum production.   Cardiovascular: Denies chest pain, chest tightness, palpitations or swelling in the hands or feet.  GU: Denies urgency, frequency, pain with urination, burning sensation, blood in urine, odor or discharge. Skin: Denies redness, rashes, lesions or ulcercations.  Neurological: Denies dizziness, difficulty with memory, difficulty with speech or problems with balance and coordination.  No other specific complaints in a complete review of systems (except as listed in HPI above).  Objective:   Physical Exam   BP 142/82  Pulse 98  Temp(Src) 98.2 F (36.8 C) (Oral)  Wt 192 lb 4 oz (87.204 kg)  SpO2 98% Wt Readings from Last 3 Encounters:  01/04/14 192 lb 4 oz (87.204 kg)  01/04/14 193 lb 12.8 oz (87.907 kg)  12/28/13 200 lb 8 oz (90.946 kg)    General: Appears her stated age, well developed, well nourished in NAD. Skin: Warm, dry and intact. No rashes, lesions or ulcerations noted. Cardiovascular: Normal rate and rhythm. S1,S2 noted.  No murmur, rubs or gallops noted. No JVD or BLE edema. No carotid bruits noted. Pulmonary/Chest: Normal effort and positive vesicular breath sounds. No respiratory distress. No wheezes, rales or ronchi noted.   BMET     Component Value Date/Time   NA 136 01/04/2014 1237   NA 140 04/18/2011 1136   K 3.6 01/04/2014 1237   K 3.8 04/18/2011 1136   CL 103 04/18/2011 1136   CO2 22 01/04/2014 1237   CO2 28 04/18/2011 1136   GLUCOSE 344* 01/04/2014 1237   GLUCOSE 97 04/18/2011 1136   BUN 21.0 01/04/2014 1237   BUN 13 04/18/2011 1136   CREATININE 0.9 01/04/2014 1237   CREATININE 0.6 04/18/2011 1136   CALCIUM 9.7 01/04/2014 1237   CALCIUM 9.3 04/18/2011 1136    Lipid Panel     Component Value Date/Time   CHOL 192 04/18/2011 1136   TRIG 182.0* 04/18/2011 1136   HDL 44.90 04/18/2011 1136   CHOLHDL 4 04/18/2011 1136   VLDL 36.4 04/18/2011 1136   LDLCALC 111* 04/18/2011 1136    CBC    Component Value Date/Time   WBC 6.3 01/04/2014 1237   WBC 8.5 12/03/2013 1325   RBC 4.46 01/04/2014 1237   RBC 4.67 12/03/2013 1325   HGB 12.4 01/04/2014 1237   HGB 12.6 12/03/2013 1325   HCT 37.0 01/04/2014 1237   HCT 38.8 12/03/2013 1325   PLT 241 01/04/2014 1237   PLT 243 12/03/2013 1325   MCV 83.1 01/04/2014 1237   MCV 83.1 12/03/2013 1325   MCH 27.9 01/04/2014 1237   MCH 27.0 12/03/2013 1325   MCHC 33.6 01/04/2014 1237   MCHC 32.5 12/03/2013 1325   RDW 13.5 01/04/2014 1237   RDW 13.4 12/03/2013 1325   LYMPHSABS 3.2 01/04/2014 1237   MONOABS 0.4 01/04/2014 1237   EOSABS 0.2 01/04/2014 1237   BASOSABS 0.0 01/04/2014 1237    Hgb A1C No results found for this basename: HGBA1C        Assessment & Plan:

## 2014-01-04 NOTE — Telephone Encounter (Signed)
GV AND PRINTED APPT SCHED AN DAVS FOR PT FOR jAN ADN fEB...SED AND MW ADDED TX

## 2014-01-04 NOTE — Progress Notes (Signed)
Pre-visit discussion using our clinic review tool. No additional management support is needed unless otherwise documented below in the visit note.  

## 2014-01-04 NOTE — Progress Notes (Signed)
Veronica Rogers [id number] 12/07/1961 54 y.o. 01/04/2014 1:32 PM  CC Darene Lamer, MD [address] [place] [address]  DIAGNOSIS:  53 year old female with left breast cancer stage IA.     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 Rolm Bookbinder  PRIOR ONCOLOGIC HISTORY:  Veronica Rogers is a 53 y.o. female.  with  #1Patient had a screening mammogram performed that revealed a left breast mass measuring 9 mm. By ultrasound it was 9 mm at the 11:30 o'clock position 8 cm from the nipple.  She did not have an MRI performed. 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 Patient's case was discussed at the multidisciplinary breast conference. She is now seen in the multidisciplinary breast clinic for discussion of treatment options. She was seen by Dr. Rolm Bookbinder as well as Dr. Gery Pray.  #2 Patient is now status post lumpectomy performed on 10/29/2013. Her final pathology did reveal grade 2, 0.9 cm invasive ductal carcinoma that was ER positive PR positive. One sentinel node was positive for micrometastasis.  #3 Patient also had an Oncotype DX testing done and her recurrence score was 29 giving her a 17% risk of distant recurrence.  #4 patient began Taxotere Cytoxan on 12/07/2013 total of 4 cycles of chemotherapy is planned  CURRENT THERAPY: cycle 2 day 8 Taxotere Cytoxan adjuvantly  INTERVAL HISTORY: Patient is seen in followup today for cycle 2 day 8. Overall she's doing well with her chemotherapy. She tells me this cycle was a lot better than her cycle 1. She does have some fatigue. But she denies any headaches double vision  blurring of vision fevers chills or night sweats. No peripheral paresthesias no myalgias and arthralgias. She of course does experiences aches and pains from the Neulasta injection. She has no bleeding problems no changes in her bowel bladder habits. Remainder of the 10 point review of systems is negative.  Past Medical History: Past Medical History  Diagnosis Date  . Breast cancer   . Anxiety   . Depression   . Contact lens/glasses fitting     wears contacts or glasses    Past Surgical History: Past Surgical History  Procedure Laterality Date  . Breast biopsy  1996    right breast, benign  . Wisdom tooth extraction    . Colonoscopy    . Breast lumpectomy with needle localization and axillary sentinel lymph node bx Left 10/29/2013    Procedure: BREAST LUMPECTOMY WITH NEEDLE LOCALIZATION AND AXILLARY SENTINEL LYMPH NODE BX;  Surgeon: Rolm Bookbinder, MD;  Location: [facility];  Service: General;  Laterality: Left;    Family History: Family History  Problem Relation Age of Onset  . Squamous cell carcinoma Father   . Cancer Father   . Diabetes Father   . Hypertension Mother   . Diabetes Sister     Social History History  Substance Use Topics  . Smoking status: Never Smoker   . Smokeless tobacco: Never Used  . Alcohol Use: Yes     Comment: 1-2 drinks a month    Allergies: Allergies  Allergen Reactions  . Erythromycin Other (See Comments)  Stomach pain  . Tape Rash    Current Medications: Current Outpatient Prescriptions  Medication Sig Dispense Refill  . dexamethasone (DECADRON) 4 MG tablet Take 2 tablets ($RemoveBe'8mg'CzCjhLbtA$ s) twice a day the day before Taxotere. Then take 2 tablets ($RemoveBe'8mg'FTSQKhPFJ$ s) twice a day starting the day after Taxotere for 3 days.  30 tablet  1  . lidocaine-prilocaine (EMLA) cream Apply 1 application topically as needed.  30 g  1  . ondansetron (ZOFRAN) 8 MG tablet Take two times a day starting the day after chemotherapy for 3 days. Then take two  times a day as needed for nausea or vomiting.  30 tablet  1  . prochlorperazine (COMPAZINE) 10 MG tablet Take 1 tablet (10 mg total) by mouth every 6 (six) hours as needed for nausea or vomiting.  30 tablet  1  . sertraline (ZOLOFT) 50 MG tablet Take 25 mg by mouth daily.       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  1 Units  0  . LORazepam (ATIVAN) 0.5 MG tablet Take 1 tablet (0.5 mg) every 6 hours as needed for nausea or vomiting.  30 tablet  0   No current facility-administered medications for this visit.   REVIEW OF SYSTEMS: A 10 point review of systems was conducted and is otherwise negative except for what is noted above.    PHYSICAL EXAMINATION: Blood pressure 134/80, pulse 105, temperature 98.4 F (36.9 C), temperature source Oral, resp. rate 18, height $RemoveBe'5\' 8"'IyebtZTfQ$  (1.727 m), weight 193 lb 12.8 oz (87.907 kg). GENERAL: Patient is a well appearing female in no acute distress HEENT:  Sclerae anicteric.  Oropharynx clear and moist. No ulcerations or evidence of oropharyngeal candidiasis. Neck is supple.  NODES:  No cervical, supraclavicular, or axillary lymphadenopathy palpated.  BREAST EXAM:  Deferred. LUNGS:  Clear to auscultation bilaterally.  No wheezes or rhonchi. HEART:  Regular rate and rhythm. No murmur appreciated. ABDOMEN:  Soft, nontender.  Positive, normoactive bowel sounds. No organomegaly palpated. MSK:  No focal spinal tenderness to palpation. Full range of motion bilaterally in the upper extremities. EXTREMITIES:  No peripheral edema.   SKIN:  Clear with no obvious rashes or skin changes. No nail dyscrasia. NEURO:  Nonfocal. Well oriented.  Appropriate affect. ECOG PERFORMANCE STATUS: 0 - Asymptomatic   STUDIES/RESULTS: US Breast Left  11-09-13   CLINICAL DATA:  Patient presents for additional views of the left breast as followup to a recent screening exam suggesting a possible mass.  EXAM: DIGITAL DIAGNOSTIC UNILATERAL LEFT MAMMOGRAM  LIMITED; ULTRASOUND LEFT BREAST  COMPARISON:  09/01/2012, 09/27/2011, 07/19/2010, 05/26/2009, 04/01/2008 and 02/05/2007  ACR Breast Density Category b: There are scattered areas of fibroglandular density.  FINDINGS: Spot compression views demonstrate a spiculated 1 cm mass over the slightly inner upper left breast.  Ultrasound is performed, showing an irregular bordered hypoechoic solid mass with minimal shadowing at the 11:30 position of the left breast 8 cm from the nipple measuring 8 x 9 x 9 mm. Ultrasound of the left axilla demonstrates no abnormal appearing lymph nodes.  IMPRESSION: Suspicious 8 x 9 x 9 mm irregular bordered hypoechoic mass at the 11:30 position of the left breast 8 cm from the nipple.  RECOMMENDATION: Recommend ultrasound-guided core needle biopsy of the suspicious mass.  I have discussed the findings and recommendations with the patient. Results were also provided in writing at the conclusion of the visit. If applicable, a reminder letter will be sent to the patient regarding the  next appointment.  BI-RADS CATEGORY  5: Highly suggestive of malignancy - appropriate action should be taken.  Biopsy will be done today.   Electronically Signed   By: Marin Olp M.D.   On: 10/11/2013 16:33   [place] L  10/11/2013   CLINICAL DATA:  Patient presents for additional views of the left breast as followup to a recent screening exam suggesting a possible mass.  EXAM: DIGITAL DIAGNOSTIC UNILATERAL LEFT MAMMOGRAM LIMITED; ULTRASOUND LEFT BREAST  COMPARISON:  09/01/2012, 09/27/2011, 07/19/2010, 05/26/2009, 04/01/2008 and 02/05/2007  ACR Breast Density Category b: There are scattered areas of fibroglandular density.  FINDINGS: Spot compression views demonstrate a spiculated 1 cm mass over the slightly inner upper left breast.  Ultrasound is performed, showing an irregular bordered hypoechoic solid mass with minimal shadowing at the 11:30 position of the left breast 8 cm from the nipple  measuring 8 x 9 x 9 mm. Ultrasound of the left axilla demonstrates no abnormal appearing lymph nodes.  IMPRESSION: Suspicious 8 x 9 x 9 mm irregular bordered hypoechoic mass at the 11:30 position of the left breast 8 cm from the nipple.  RECOMMENDATION: Recommend ultrasound-guided core needle biopsy of the suspicious mass.  I have discussed the findings and recommendations with the patient. Results were also provided in writing at the conclusion of the visit. If applicable, a reminder letter will be sent to the patient regarding the next appointment.  BI-RADS CATEGORY  5: Highly suggestive of malignancy - appropriate action should be taken.  Biopsy will be done today.   Electronically Signed   By: Marin Olp M.D.   On: 10/11/2013 16:33   Mm Digital Diagnostic Unilat L  10/11/2013   CLINICAL DATA:  The patient is post ultrasound-guided core biopsy of a 9 mm spiculated mass over the 11:30 position of the left breast.  EXAM: DIGITAL DIAGNOSTIC UNILATERAL LEFT MAMMOGRAM  COMPARISON:  Previous exams  FINDINGS: Mammographic images were obtained following ultrasound guided biopsy of an irregular 9 mm mass of the 11:30 position of the left breast. Exam demonstrates satisfactory placement of a cylindrical shaped metallic clip over the biopsied mass in the upper inner left breast.  IMPRESSION: Satisfactory clip placement post ultrasound core biopsy left breast.  Final Assessment: Post Procedure Mammograms for Marker Placement   Electronically Signed   By: Marin Olp M.D.   On: 10/11/2013 17:04   Mm Radiologist Eval And Mgmt  10/12/2013   EXAM: ESTABLISHED PATIENT OFFICE VISIT -LEVEL II (40981)  HISTORY OF PRESENT ILLNESS: Screening detected left breast mass. Ultrasound-guided core needle biopsy was performed yesterday. Pathology returned as grade 2-3 invasive ductal carcinoma.  Patient denies significant pain or bleeding at the biopsy site.  CHIEF COMPLAINT: Patient returns 1 day post ultrasound-guided core  needle biopsy of a screening detected 0.9 cm mass in the upper left breast.  PHYSICAL EXAMINATION: Biopsy site in the upper left breast is clean without evidence of bleeding from the incision. The breast is soft on palpation and there is no evidence of hematoma.  ASSESSMENT AND PLAN: 0.9 cm mass in the upper left breast, pathology from ultrasound-guided core needle biopsy revealing grade 2-3 invasive ductal carcinoma, concordant with imaging findings.  Patient is scheduled for the Multidisciplinary Breast Cancer Clinic at the [facility] on Wednesday, October 22.   Electronically Signed   By: Evangeline Dakin M.D.   On: 10/12/2013 17:08   [place] Lt Breast Bx W Loc Dev 1st Lesion  Img Bx Spec US Guide  10/11/2013   CLINICAL DATA:  Patient presents for ultrasound-guided core needle biopsy of a 9 mm suspicious mass at the 11:30 position of the left breast 8 cm from the nipple.  EXAM: RADIOLOGY EXAMINATION  COMPARISON:  Previous exams.  PROCEDURE: I met with the patient and we discussed the procedure of ultrasound-guided biopsy, including benefits and alternatives. We discussed the high likelihood of a successful procedure. We discussed the risks of the procedure including infection, bleeding, tissue injury, clip migration, and inadequate sampling. Informed written consent was given.  Using sterile technique and 2% Lidocaine as local anesthetic, under direct ultrasound visualization, a 12 gauge vacuum-assisteddevice was used to perform biopsy of the targeted mass at the 11:30 position using a lateral to medial approach. At the conclusion of the procedure, a top hat shaped tissue marker clip was deployed into the biopsy cavity. Follow-up 2-view mammogram was performed and dictated separately.  The usual time-out protocol was performed immediately prior to the procedure.  IMPRESSION: Ultrasound-guided biopsy of a suspicious left breast mass. No apparent complications.   Electronically  Signed   By: Elberta Fortis M.D.   On: 10/11/2013 16:41     LABS:    Chemistry      Component Value Date/Time   NA 136 01/04/2014 1237   NA 140 04/18/2011 1136   K 3.6 01/04/2014 1237   K 3.8 04/18/2011 1136   CL 103 04/18/2011 1136   CO2 22 01/04/2014 1237   CO2 28 04/18/2011 1136   BUN 21.0 01/04/2014 1237   BUN 13 04/18/2011 1136   CREATININE 0.9 01/04/2014 1237   CREATININE 0.6 04/18/2011 1136      Component Value Date/Time   CALCIUM 9.7 01/04/2014 1237   CALCIUM 9.3 04/18/2011 1136   ALKPHOS 72 01/04/2014 1237   AST 14 01/04/2014 1237   ALT 27 01/04/2014 1237   BILITOT 0.70 01/04/2014 1237      Lab Results  Component Value Date   WBC 6.3 01/04/2014   HGB 12.4 01/04/2014   HCT 37.0 01/04/2014   MCV 83.1 01/04/2014   PLT 241 01/04/2014   PATHOLOGY: ADDITIONAL INFORMATION: 1. A sample (block 1B) was sent to [facility] for Oncotype testing. The patient's recurrence score is 29. Those patients who had a recurrence score of 29 had an average rate of distant recurrence of 17%. Veronica Leisure MD Pathologist, Electronic Signature ( Signed 11/22/2013) 1. CHROMOGENIC IN-SITU HYBRIDIZATION Results: HER-2/NEU BY CISH - NO AMPLIFICATION OF HER-2 DETECTED. RESULT RATIO OF HER2: CEP 17 SIGNALS 0.95 AVERAGE HER2 COPY NUMBER PER CELL 1.95 REFERENCE RANGE NEGATIVE HER2/Chr17 Ratio <2.0 and Average HER2 copy number <4.0 EQUIVOCAL HER2/Chr17 Ratio <2.0 and Average HER2 copy number 4.0 and <6.0 POSITIVE HER2/Chr17 Ratio >=2.0 and/or Average HER2 copy number >=6.0 Veronica Leisure MD Pathologist, Electronic Signature ( Signed 11/04/2013) FINAL DIAGNOSIS Diagnosis 1. Breast, lumpectomy, Left - INVASIVE DUCTAL CARCINOMA, SEE COMMENT. 1 of 4 FINAL for Veronica, Rogers [phone]) Diagnosis(continued) - LYMPHOVASCULAR INVASION IDENTIFIED. - INVASIVE TUMOR IS 1 MM FROM NEAREST MARGIN (MEDIAL). - PREVIOUS BIOPSY SITE IDENTIFIED. - SEE TUMOR TEMPLATE BELOW. 2. Breast, excision, left - BENIGN BREAST TISSUE,  SEE COMMENT. - NEGATIVE FOR ATYPIA OR MALIGNANCY. - SURGICAL MARGINS, NEGATIVE FOR ATYPIA OR MALIGNANCY. 3. Lymph node, sentinel, biopsy, #1 left Axilla - ONE LYMPH NODE, POSITIVE FOR METASTATIC MAMMARY CARCINOMA (1/1). - TUMOR DEPOSIT IS 1 MM. - NO EXTRACAPSULAR TUMOR EXTENSION IDENTIFIED. 4. Lymph nodes, regional resection, left Axilla - THREE  LYMPH NODES, NEGATIVE FOR TUMOR (0/3). 5. Lymph node, sentinel, biopsy, #2 left - ONE LYMPH NODE, NEGATIVE FOR TUMOR (0/1). 6. Lymph node, sentinel, biopsy, #3 left - ONE LYMPH NODE, NEGATIVE FOR TUMOR (0/1). 7. Lymph node, sentinel, biopsy, #4 left - ONE LYMPH NODE, NEGATIVE FOR TUMOR (0/1). Microscopic Comment 1. BREAST, INVASIVE TUMOR, WITH LYMPH NODE SAMPLING Specimen, including laterality and lymph node sampling (sentinel, non-sentinel): Left breast plus sentinel lymph nodes Procedure: Lumpectomy Histologic type: Ductal Grade: II of III Tubule formation: II Nuclear pleomorphism: II Mitotic:II Tumor size (gross measurement): 0.9 cm Margins: Invasive, distance to closest margin: 1 mm In-situ, distance to closest margin: N/A If margin positive, focally or broadly: N/A Lymphovascular invasion: Present Ductal carcinoma in situ: Absent Grade: N/A Extensive intraductal component: N/A Lobular neoplasia: Absent Tumor focality: Unifocal Treatment effect: None If present, treatment effect in breast tissue, lymph nodes or both: N/A Extent of tumor: Skin: N/A Nipple: N/A Skeletal muscle: N/A Lymph nodes: Examined: 4 Sentinel 3 Non-sentinel 7 Total 2 of 4 FINAL for Veronica, Rogers [phone]) Microscopic Comment(continued) Lymph nodes with metastasis: 1 Isolated tumor cells (< 0.2 mm): N/A Micrometastasis: (> 0.2 mm and < 2.0 mm): 1 = 1 mm Macrometastasis: (> 2.0 mm): N/A Extracapsular extension: Absent Breast prognostic profile: Estrogen receptor: Not repeated, previous study demonstrated 100% positivity  ([id number]) Progesterone receptor: Not repeated, previous study demonstrated 100% positivity ([id number]) Her 2 neu: Repeated, previous study demonstrated no amplification (1.85) ([id number]) Ki-67: Not repeated, previous study demonstrated 46% proliferative rate ([id number]) Non-neoplastic breast: Previous biopsy site TNM: pT1b, pN1, pMX Comments: None 2. There is no mass grossly identified. Representative section, including margins, demonstrate a nonneoplastic to include fibrocystic change, pseudoangiomatous stromal hyperplasia, and usual ductal hyperplasia. There are no atypical or malignant epithelial or stromal features present. [place]i RUND DO Pathologist, Electronic Signature (Case signed 11/02/2013) Intraoperative Diagnosis RAPID INTRAOPERAS  ASSESSMENT    53 year old female with  #1 a screen detected left breast mass measuring 9 mm by ultrasound at the 11:30 o'clock position 8 cm from the nipple. She did not get an MRI. 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   #2 final pathology revealed grade 2, 0.9 cm invasive ductal carcinoma one sentinel node was positive for metastatic disease. Tumor ER positive PR positive HER-2/neu negative with an elevated Ki-67.  #3 Oncotype DX testing recurrence score 29 giving her a 17% risk of distant recurrence at 5 years with tamoxifen alone. I have recommended adjuvant curative intent chemotherapy consisting of Taxotere Cytoxan for a total of 4 cycles. Risks benefits and side effects were discussed with the patient.  #4 status post cycle 2/4 of Taxotere and Cytoxan. Tolerating it well. Minimal side effects. She will continue symptomatic management.  PLAN: #1 Patient is doing well today.  I reviewed her CBC with her in detail.  It is stable.   #2 patient will return in 2 weeks' time for cycle 3 day 1 of  TC. However she knows to call me with any problems questions or concerns in the meantime.  All questions were answered. The patient knows to call the clinic with any problems, questions or concerns. We can certainly see the patient much sooner if necessary.  I spent 25 minutes counseling the patient face to face. The total time spent in the appointment was 30 minutes.  Marcy Panning, MD Medical/Oncology [place] [phone]  beeper) [phone] (Office)  01/04/2014, 1:32 PM

## 2014-01-04 NOTE — Telephone Encounter (Signed)
Per staff message and POF I have scheduled appts.  JMW  

## 2014-01-04 NOTE — Assessment & Plan Note (Addendum)
Education provided Will start Lantus 18 units QHS Advised pt to call back in 7-10 days with fasting blood sugar results Advised pt to watch for s/s of hypoglycemia, hand out provided  Pt declines having her A1C checked today. Reports she already gets stuck enough as it is.  She already has a monitor and is testing multiple times daily

## 2014-01-05 MED ORDER — INSULIN PEN NEEDLE 32G X 4 MM MISC: 1.0000 | Freq: Every day | Status: DC

## 2014-01-05 NOTE — Addendum Note (Signed)
Addended by: Lurlean Nanny on: 01/05/2014 08:44 AM   Modules accepted: Orders

## 2014-01-10 ENCOUNTER — Encounter: Payer: Self-pay | Admitting: Family Medicine

## 2014-01-17 ENCOUNTER — Other Ambulatory Visit: Payer: Self-pay | Admitting: *Deleted

## 2014-01-18 ENCOUNTER — Ambulatory Visit (HOSPITAL_BASED_OUTPATIENT_CLINIC_OR_DEPARTMENT_OTHER): Payer: 59 | Admitting: Oncology

## 2014-01-18 ENCOUNTER — Ambulatory Visit (HOSPITAL_BASED_OUTPATIENT_CLINIC_OR_DEPARTMENT_OTHER): Payer: 59

## 2014-01-18 ENCOUNTER — Other Ambulatory Visit (HOSPITAL_BASED_OUTPATIENT_CLINIC_OR_DEPARTMENT_OTHER): Payer: 59

## 2014-01-18 ENCOUNTER — Other Ambulatory Visit: Payer: 59

## 2014-01-18 ENCOUNTER — Ambulatory Visit: Payer: 59 | Admitting: Lab

## 2014-01-18 ENCOUNTER — Encounter: Payer: Self-pay | Admitting: Oncology

## 2014-01-18 VITALS — BP 158/76 | HR 93 | Temp 98.0°F | Resp 18 | Ht 68.0 in | Wt 204.0 lb

## 2014-01-18 DIAGNOSIS — C50212 Malignant neoplasm of upper-inner quadrant of left female breast: Secondary | ICD-10-CM

## 2014-01-18 DIAGNOSIS — Z5111 Encounter for antineoplastic chemotherapy: Secondary | ICD-10-CM

## 2014-01-18 DIAGNOSIS — C50219 Malignant neoplasm of upper-inner quadrant of unspecified female breast: Secondary | ICD-10-CM

## 2014-01-18 DIAGNOSIS — R5381 Other malaise: Secondary | ICD-10-CM

## 2014-01-18 DIAGNOSIS — R5383 Other fatigue: Secondary | ICD-10-CM

## 2014-01-18 DIAGNOSIS — Z170 Estrogen receptor positive status [ER+]: Secondary | ICD-10-CM

## 2014-01-18 DIAGNOSIS — M7989 Other specified soft tissue disorders: Secondary | ICD-10-CM

## 2014-01-18 LAB — CBC WITH DIFFERENTIAL/PLATELET
BASO%: 0.7 % (ref 0.0–2.0)
Basophils Absolute: 0.1 10*3/uL (ref 0.0–0.1)
EOS%: 0.3 % (ref 0.0–7.0)
Eosinophils Absolute: 0 10*3/uL (ref 0.0–0.5)
HCT: 33.7 % — ABNORMAL LOW (ref 34.8–46.6)
HGB: 10.9 g/dL — ABNORMAL LOW (ref 11.6–15.9)
LYMPH%: 27.9 % (ref 14.0–49.7)
MCH: 27 pg (ref 25.1–34.0)
MCHC: 32.3 g/dL (ref 31.5–36.0)
MCV: 83.4 fL (ref 79.5–101.0)
MONO#: 0.4 10*3/uL (ref 0.1–0.9)
MONO%: 5.2 % (ref 0.0–14.0)
NEUT#: 5 10*3/uL (ref 1.5–6.5)
NEUT%: 65.9 % (ref 38.4–76.8)
NRBC: 0 % (ref 0–0)
Platelets: 206 10*3/uL (ref 145–400)
RBC: 4.04 10*6/uL (ref 3.70–5.45)
RDW: 15.2 % — ABNORMAL HIGH (ref 11.2–14.5)
WBC: 7.6 10*3/uL (ref 3.9–10.3)
lymph#: 2.1 10*3/uL (ref 0.9–3.3)

## 2014-01-18 LAB — COMPREHENSIVE METABOLIC PANEL (CC13)
ALT: 30 U/L (ref 0–55)
AST: 25 U/L (ref 5–34)
Albumin: 3.3 g/dL — ABNORMAL LOW (ref 3.5–5.0)
Alkaline Phosphatase: 50 U/L (ref 40–150)
Anion Gap: 9 mEq/L (ref 3–11)
BILIRUBIN TOTAL: 0.62 mg/dL (ref 0.20–1.20)
BUN: 8 mg/dL (ref 7.0–26.0)
CO2: 26 mEq/L (ref 22–29)
Calcium: 9.3 mg/dL (ref 8.4–10.4)
Chloride: 104 mEq/L (ref 98–109)
Creatinine: 0.6 mg/dL (ref 0.6–1.1)
Glucose: 153 mg/dl — ABNORMAL HIGH (ref 70–140)
Potassium: 3.9 mEq/L (ref 3.5–5.1)
Sodium: 140 mEq/L (ref 136–145)
Total Protein: 6 g/dL — ABNORMAL LOW (ref 6.4–8.3)

## 2014-01-18 MED ORDER — SODIUM CHLORIDE 0.9 % IV SOLN
75.0000 mg/m2 | Freq: Once | INTRAVENOUS | Status: AC
  Administered 2014-01-18: 160 mg via INTRAVENOUS
  Filled 2014-01-18: qty 16

## 2014-01-18 MED ORDER — ONDANSETRON 16 MG/50ML IVPB (CHCC)
INTRAVENOUS | Status: AC
  Filled 2014-01-18: qty 16

## 2014-01-18 MED ORDER — SODIUM CHLORIDE 0.9 % IJ SOLN
10.0000 mL | INTRAMUSCULAR | Status: DC | PRN
  Administered 2014-01-18: 10 mL
  Filled 2014-01-18: qty 10

## 2014-01-18 MED ORDER — HEPARIN SOD (PORK) LOCK FLUSH 100 UNIT/ML IV SOLN
500.0000 [IU] | Freq: Once | INTRAVENOUS | Status: AC | PRN
  Administered 2014-01-18: 500 [IU]
  Filled 2014-01-18: qty 5

## 2014-01-18 MED ORDER — DEXAMETHASONE SODIUM PHOSPHATE 20 MG/5ML IJ SOLN
20.0000 mg | Freq: Once | INTRAMUSCULAR | Status: AC
  Administered 2014-01-18: 20 mg via INTRAVENOUS

## 2014-01-18 MED ORDER — DEXAMETHASONE SODIUM PHOSPHATE 20 MG/5ML IJ SOLN
INTRAMUSCULAR | Status: AC
  Filled 2014-01-18: qty 5

## 2014-01-18 MED ORDER — SODIUM CHLORIDE 0.9 % IV SOLN
Freq: Once | INTRAVENOUS | Status: AC
  Administered 2014-01-18: 10:00:00 via INTRAVENOUS

## 2014-01-18 MED ORDER — SODIUM CHLORIDE 0.9 % IV SOLN
600.0000 mg/m2 | Freq: Once | INTRAVENOUS | Status: AC
  Administered 2014-01-18: 1240 mg via INTRAVENOUS
  Filled 2014-01-18: qty 62

## 2014-01-18 MED ORDER — ONDANSETRON 16 MG/50ML IVPB (CHCC)
16.0000 mg | Freq: Once | INTRAVENOUS | Status: AC
  Administered 2014-01-18: 16 mg via INTRAVENOUS

## 2014-01-18 NOTE — Progress Notes (Signed)
Veronica Rogers [id number] July 04, 1961 53 y.o. 01/18/2014 9:16 AM  CC Darene Lamer, MD [address] [place] [address]  DIAGNOSIS:  53 year old female with left breast cancer stage IA.     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 Rolm Bookbinder  PRIOR ONCOLOGIC HISTORY:  Veronica Rogers is a 53 y.o. female.  with  #1Patient had a screening mammogram performed that revealed a left breast mass measuring 9 mm. By ultrasound it was 9 mm at the 11:30 o'clock position 8 cm from the nipple.  She did not have an MRI performed. 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 Patient's case was discussed at the multidisciplinary breast conference. She is now seen in the multidisciplinary breast clinic for discussion of treatment options. She was seen by Dr. Rolm Bookbinder as well as Dr. Gery Pray.  #2 Patient is now status post lumpectomy performed on 10/29/2013. Her final pathology did reveal grade 2, 0.9 cm invasive ductal carcinoma that was ER positive PR positive. One sentinel node was positive for micrometastasis.  #3 Patient also had an Oncotype DX testing done and her recurrence score was 29 giving her a 17% risk of distant recurrence.  #4 patient began Taxotere Cytoxan on 12/07/2013 total of 4 cycles of chemotherapy is planned  CURRENT THERAPY: cycle 3 day 1 Taxotere Cytoxan adjuvantly  INTERVAL HISTORY: Patient is seen in followup today for cycle 3 day 1. Overall she's doing well with her chemotherapy. She does have some fatigue. But she denies any headaches double vision blurring of vision fevers chills or night sweats. No  peripheral paresthesias no myalgias and arthralgias. She of course does experiences aches and pains from the Neulasta injection. She has no bleeding problems no changes in her bowel bladder habits. Patient does have some cracking of skin and we discussed use of Aquaphor for skin therapy. She is also having a lot of puffiness. This is most likely due to the steroids. I recommended that she stay off of salted foods. Remainder of the 10 point review of systems is negative.  Past Medical History: Past Medical History  Diagnosis Date  . Breast cancer   . Anxiety   . Depression   . Contact lens/glasses fitting     wears contacts or glasses    Past Surgical History: Past Surgical History  Procedure Laterality Date  . Breast biopsy  1996    right breast, benign  . Wisdom tooth extraction    . Colonoscopy    . Breast lumpectomy with needle localization and axillary sentinel lymph node bx Left 10/29/2013    Procedure: BREAST LUMPECTOMY WITH NEEDLE LOCALIZATION AND AXILLARY SENTINEL LYMPH NODE BX;  Surgeon: Rolm Bookbinder, MD;  Location: [facility];  Service: General;  Laterality: Left;    Family History: Family History  Problem Relation Age of Onset  . Squamous cell carcinoma Father   . Cancer Father   . Diabetes Father   . Hypertension Mother   . Diabetes Sister     Social History History  Substance Use Topics  . Smoking status: Never Smoker   . Smokeless tobacco: Never Used  .  Alcohol Use: Yes     Comment: 1-2 drinks a month    Allergies: Allergies  Allergen Reactions  . Erythromycin Other (See Comments)    Stomach pain  . Tape Rash    Current Medications: Current Outpatient Prescriptions  Medication Sig Dispense Refill  . dexamethasone (DECADRON) 4 MG tablet Take 2 tablets ($RemoveBe'8mg'NxozuPtDL$ s) twice a day the day before Taxotere. Then take 2 tablets ($RemoveBe'8mg'uEYDzAZXJ$ s) twice a day starting the day after Taxotere for 3 days.  30 tablet  1  . insulin glargine (LANTUS) 100 UNIT/ML  injection Inject 0.18 mLs (18 Units total) into the skin at bedtime.  10 mL  11  . Insulin Pen Needle (CLICKFINE PEN NEEDLES) 32G X 4 MM MISC Inject 1 each into the skin daily.  50 each  1  . lidocaine-prilocaine (EMLA) cream Apply 1 application topically as needed.  30 g  1  . LORazepam (ATIVAN) 0.5 MG tablet Take 1 tablet (0.5 mg) every 6 hours as needed for nausea or vomiting.  30 tablet  0  . ondansetron (ZOFRAN) 8 MG tablet Take two times a day starting the day after chemotherapy for 3 days. Then take two times a day as needed for nausea or vomiting.  30 tablet  1  . prochlorperazine (COMPAZINE) 10 MG tablet Take 1 tablet (10 mg total) by mouth every 6 (six) hours as needed for nausea or vomiting.  30 tablet  1  . sertraline (ZOLOFT) 50 MG tablet Take 25 mg by mouth daily.       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  1 Units  0   No current facility-administered medications for this visit.   REVIEW OF SYSTEMS: A 10 point review of systems was conducted and is otherwise negative except for what is noted above.    PHYSICAL EXAMINATION: Blood pressure 158/76, pulse 93, temperature 98 F (36.7 C), temperature source Oral, resp. rate 18, height $RemoveBe'5\' 8"'GkrMkmiJa$  (1.727 m), weight 204 lb (92.534 kg). GENERAL: Patient is a well appearing female in no acute distress HEENT:  Sclerae anicteric.  Oropharynx clear and moist. No ulcerations or evidence of oropharyngeal candidiasis. Neck is supple.  NODES:  No cervical, supraclavicular, or axillary lymphadenopathy palpated.  BREAST EXAM:  Deferred. LUNGS:  Clear to auscultation bilaterally.  No wheezes or rhonchi. HEART:  Regular rate and rhythm. No murmur appreciated. ABDOMEN:  Soft, nontender.  Positive, normoactive bowel sounds. No organomegaly palpated. MSK:  No focal spinal tenderness to palpation. Full range of motion bilaterally in the upper extremities. EXTREMITIES:  No peripheral edema.   SKIN:  Clear with no obvious  rashes or skin changes. No nail dyscrasia. NEURO:  Nonfocal. Well oriented.  Appropriate affect. ECOG PERFORMANCE STATUS: 0 - Asymptomatic   STUDIES/RESULTS: US Breast Left  2013/10/23   CLINICAL DATA:  Patient presents for additional views of the left breast as followup to a recent screening exam suggesting a possible mass.  EXAM: DIGITAL DIAGNOSTIC UNILATERAL LEFT MAMMOGRAM LIMITED; ULTRASOUND LEFT BREAST  COMPARISON:  09/01/2012, 09/27/2011, 07/19/2010, 05/26/2009, 04/01/2008 and 02/05/2007  ACR Breast Density Category b: There are scattered areas of fibroglandular density.  FINDINGS: Spot compression views demonstrate a spiculated 1 cm mass over the slightly inner upper left breast.  Ultrasound is performed, showing an irregular bordered hypoechoic solid mass with minimal shadowing at the 11:30 position of the left breast 8 cm from the nipple measuring 8 x 9 x 9 mm. Ultrasound of the left axilla demonstrates no abnormal  appearing lymph nodes.  IMPRESSION: Suspicious 8 x 9 x 9 mm irregular bordered hypoechoic mass at the 11:30 position of the left breast 8 cm from the nipple.  RECOMMENDATION: Recommend ultrasound-guided core needle biopsy of the suspicious mass.  I have discussed the findings and recommendations with the patient. Results were also provided in writing at the conclusion of the visit. If applicable, a reminder letter will be sent to the patient regarding the next appointment.  BI-RADS CATEGORY  5: Highly suggestive of malignancy - appropriate action should be taken.  Biopsy will be done today.   Electronically Signed   By: Marin Olp M.D.   On: 10/11/2013 16:33   [place] [facility]  10/11/2013   CLINICAL DATA:  Patient presents for additional views of the left breast as followup to a recent screening exam suggesting a possible mass.  EXAM: DIGITAL DIAGNOSTIC UNILATERAL LEFT MAMMOGRAM LIMITED; ULTRASOUND LEFT BREAST  COMPARISON:  09/01/2012, 09/27/2011, 07/19/2010, 05/26/2009,  04/01/2008 and 02/05/2007  ACR Breast Density Category b: There are scattered areas of fibroglandular density.  FINDINGS: Spot compression views demonstrate a spiculated 1 cm mass over the slightly inner upper left breast.  Ultrasound is performed, showing an irregular bordered hypoechoic solid mass with minimal shadowing at the 11:30 position of the left breast 8 cm from the nipple measuring 8 x 9 x 9 mm. Ultrasound of the left axilla demonstrates no abnormal appearing lymph nodes.  IMPRESSION: Suspicious 8 x 9 x 9 mm irregular bordered hypoechoic mass at the 11:30 position of the left breast 8 cm from the nipple.  RECOMMENDATION: Recommend ultrasound-guided core needle biopsy of the suspicious mass.  I have discussed the findings and recommendations with the patient. Results were also provided in writing at the conclusion of the visit. If applicable, a reminder letter will be sent to the patient regarding the next appointment.  BI-RADS CATEGORY  5: Highly suggestive of malignancy - appropriate action should be taken.  Biopsy will be done today.   Electronically Signed   By: Marin Olp M.D.   On: 10/11/2013 16:33   Mm Digital Diagnostic Unilat L  10/11/2013   CLINICAL DATA:  The patient is post ultrasound-guided core biopsy of a 9 mm spiculated mass over the 11:30 position of the left breast.  EXAM: DIGITAL DIAGNOSTIC UNILATERAL LEFT MAMMOGRAM  COMPARISON:  Previous exams  FINDINGS: Mammographic images were obtained following ultrasound guided biopsy of an irregular 9 mm mass of the 11:30 position of the left breast. Exam demonstrates satisfactory placement of a cylindrical shaped metallic clip over the biopsied mass in the upper inner left breast.  IMPRESSION: Satisfactory clip placement post ultrasound core biopsy left breast.  Final Assessment: Post Procedure Mammograms for Marker Placement   Electronically Signed   By: Marin Olp M.D.   On: 10/11/2013 17:04   Mm Radiologist Eval And  Mgmt  10/12/2013   EXAM: ESTABLISHED PATIENT OFFICE VISIT -LEVEL II (34193)  HISTORY OF PRESENT ILLNESS: Screening detected left breast mass. Ultrasound-guided core needle biopsy was performed yesterday. Pathology returned as grade 2-3 invasive ductal carcinoma.  Patient denies significant pain or bleeding at the biopsy site.  CHIEF COMPLAINT: Patient returns 1 day post ultrasound-guided core needle biopsy of a screening detected 0.9 cm mass in the upper left breast.  PHYSICAL EXAMINATION: Biopsy site in the upper left breast is clean without evidence of bleeding from the incision. The breast is soft on palpation and there is no evidence of hematoma.  ASSESSMENT  AND PLAN: 0.9 cm mass in the upper left breast, pathology from ultrasound-guided core needle biopsy revealing grade 2-3 invasive ductal carcinoma, concordant with imaging findings.  Patient is scheduled for the Multidisciplinary Breast Cancer Clinic at the [facility] on Wednesday, October 22.   Electronically Signed   By: Hulan Saas M.D.   On: 10/12/2013 17:08   [place] Lt Breast Bx W Loc Dev 1st Lesion Img Bx Spec US Guide  10/11/2013   CLINICAL DATA:  Patient presents for ultrasound-guided core needle biopsy of a 9 mm suspicious mass at the 11:30 position of the left breast 8 cm from the nipple.  EXAM: RADIOLOGY EXAMINATION  COMPARISON:  Previous exams.  PROCEDURE: I met with the patient and we discussed the procedure of ultrasound-guided biopsy, including benefits and alternatives. We discussed the high likelihood of a successful procedure. We discussed the risks of the procedure including infection, bleeding, tissue injury, clip migration, and inadequate sampling. Informed written consent was given.  Using sterile technique and 2% Lidocaine as local anesthetic, under direct ultrasound visualization, a 12 gauge vacuum-assisteddevice was used to perform biopsy of the targeted mass at the 11:30 position using a  lateral to medial approach. At the conclusion of the procedure, a top hat shaped tissue marker clip was deployed into the biopsy cavity. Follow-up 2-view mammogram was performed and dictated separately.  The usual time-out protocol was performed immediately prior to the procedure.  IMPRESSION: Ultrasound-guided biopsy of a suspicious left breast mass. No apparent complications.   Electronically Signed   By: Elberta Fortis M.D.   On: 10/11/2013 16:41     LABS:    Chemistry      Component Value Date/Time   NA 136 01/04/2014 1237   NA 140 04/18/2011 1136   K 3.6 01/04/2014 1237   K 3.8 04/18/2011 1136   CL 103 04/18/2011 1136   CO2 22 01/04/2014 1237   CO2 28 04/18/2011 1136   BUN 21.0 01/04/2014 1237   BUN 13 04/18/2011 1136   CREATININE 0.9 01/04/2014 1237   CREATININE 0.6 04/18/2011 1136      Component Value Date/Time   CALCIUM 9.7 01/04/2014 1237   CALCIUM 9.3 04/18/2011 1136   ALKPHOS 72 01/04/2014 1237   AST 14 01/04/2014 1237   ALT 27 01/04/2014 1237   BILITOT 0.70 01/04/2014 1237      Lab Results  Component Value Date   WBC 7.6 01/18/2014   HGB 10.9* 01/18/2014   HCT 33.7* 01/18/2014   MCV 83.4 01/18/2014   PLT 206 01/18/2014   PATHOLOGY: ADDITIONAL INFORMATION: 1. A sample (block 1B) was sent to [facility] for Oncotype testing. The patient's recurrence score is 29. Those patients who had a recurrence score of 29 had an average rate of distant recurrence of 17%. Pecola Leisure MD Pathologist, Electronic Signature ( Signed 11/22/2013) 1. CHROMOGENIC IN-SITU HYBRIDIZATION Results: HER-2/NEU BY CISH - NO AMPLIFICATION OF HER-2 DETECTED. RESULT RATIO OF HER2: CEP 17 SIGNALS 0.95 AVERAGE HER2 COPY NUMBER PER CELL 1.95 REFERENCE RANGE NEGATIVE HER2/Chr17 Ratio <2.0 and Average HER2 copy number <4.0 EQUIVOCAL HER2/Chr17 Ratio <2.0 and Average HER2 copy number 4.0 and <6.0 POSITIVE HER2/Chr17 Ratio >=2.0 and/or Average HER2 copy number >=6.0 Pecola Leisure MD Pathologist, Electronic Signature (  Signed 11/04/2013) FINAL DIAGNOSIS Diagnosis 1. Breast, lumpectomy, Left - INVASIVE DUCTAL CARCINOMA, SEE COMMENT. 1 of 4 FINAL for Veronica Rogers, Veronica Rogers [phone]) Diagnosis(continued) - LYMPHOVASCULAR INVASION IDENTIFIED. - INVASIVE TUMOR IS 1 MM FROM  NEAREST MARGIN (MEDIAL). - PREVIOUS BIOPSY SITE IDENTIFIED. - SEE TUMOR TEMPLATE BELOW. 2. Breast, excision, left - BENIGN BREAST TISSUE, SEE COMMENT. - NEGATIVE FOR ATYPIA OR MALIGNANCY. - SURGICAL MARGINS, NEGATIVE FOR ATYPIA OR MALIGNANCY. 3. Lymph node, sentinel, biopsy, #1 left Axilla - ONE LYMPH NODE, POSITIVE FOR METASTATIC MAMMARY CARCINOMA (1/1). - TUMOR DEPOSIT IS 1 MM. - NO EXTRACAPSULAR TUMOR EXTENSION IDENTIFIED. 4. Lymph nodes, regional resection, left Axilla - THREE LYMPH NODES, NEGATIVE FOR TUMOR (0/3). 5. Lymph node, sentinel, biopsy, #2 left - ONE LYMPH NODE, NEGATIVE FOR TUMOR (0/1). 6. Lymph node, sentinel, biopsy, #3 left - ONE LYMPH NODE, NEGATIVE FOR TUMOR (0/1). 7. Lymph node, sentinel, biopsy, #4 left - ONE LYMPH NODE, NEGATIVE FOR TUMOR (0/1). Microscopic Comment 1. BREAST, INVASIVE TUMOR, WITH LYMPH NODE SAMPLING Specimen, including laterality and lymph node sampling (sentinel, non-sentinel): Left breast plus sentinel lymph nodes Procedure: Lumpectomy Histologic type: Ductal Grade: II of III Tubule formation: II Nuclear pleomorphism: II Mitotic:II Tumor size (gross measurement): 0.9 cm Margins: Invasive, distance to closest margin: 1 mm In-situ, distance to closest margin: N/A If margin positive, focally or broadly: N/A Lymphovascular invasion: Present Ductal carcinoma in situ: Absent Grade: N/A Extensive intraductal component: N/A Lobular neoplasia: Absent Tumor focality: Unifocal Treatment effect: None If present, treatment effect in breast tissue, lymph nodes or both: N/A Extent of tumor: Skin: N/A Nipple: N/A Skeletal muscle: N/A Lymph nodes: Examined: 4 Sentinel 3 Non-sentinel 7  Total 2 of 4 FINAL for Veronica Rogers, Veronica Rogers [id number]) Microscopic Comment(continued) Lymph nodes with metastasis: 1 Isolated tumor cells (< 0.2 mm): N/A Micrometastasis: (> 0.2 mm and < 2.0 mm): 1 = 1 mm Macrometastasis: (> 2.0 mm): N/A Extracapsular extension: Absent Breast prognostic profile: Estrogen receptor: Not repeated, previous study demonstrated 100% positivity ([id number]) Progesterone receptor: Not repeated, previous study demonstrated 100% positivity ([id number]) Her 2 neu: Repeated, previous study demonstrated no amplification (1.85) ([id number]) Ki-67: Not repeated, previous study demonstrated 46% proliferative rate ([id number]) Non-neoplastic breast: Previous biopsy site TNM: pT1b, pN1, pMX Comments: None 2. There is no mass grossly identified. Representative section, including margins, demonstrate a nonneoplastic to include fibrocystic change, pseudoangiomatous stromal hyperplasia, and usual ductal hyperplasia. There are no atypical or malignant epithelial or stromal features present. [place]i RUND DO Pathologist, Electronic Signature (Case signed 11/02/2013) Intraoperative Diagnosis RAPID INTRAOPERAS  ASSESSMENT    53 year old female with  #1 a screen detected left breast mass measuring 9 mm by ultrasound at the 11:30 o'clock position 8 cm from the nipple. She did not get an MRI. 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   #2 final pathology revealed grade 2, 0.9 cm invasive ductal carcinoma one sentinel node was positive for metastatic disease. Tumor ER positive PR positive HER-2/neu negative with an elevated Ki-67.  #3 Oncotype DX testing recurrence score 29 giving her a 17% risk of distant recurrence at 5 years with tamoxifen alone. I have recommended adjuvant curative intent chemotherapy consisting of Taxotere Cytoxan for a  total of 4 cycles. Risks benefits and side effects were discussed with the patient.  #4 here for cycle 3/4 of Taxotere and Cytoxan. Tolerating it well. Minimal side effects.   PLAN: #1 proceed with cycle 3 of Taxotere and Cytoxan.  #2 we discussed better control of blood sugars. She was seen by Dr. Deborra Medina regarding this.  #3 reduced salt intake because of swelling.  #4 patient will  be seen back in one week's time for followup.   All questions were answered. The patient knows to call the clinic with any problems, questions or concerns. We can certainly see the patient much sooner if necessary.  I spent 25 minutes counseling the patient face to face. The total time spent in the appointment was 30 minutes.  Marcy Panning, MD Medical/Oncology [facility] [phone] (beeper) [phone] (Office)  01/18/2014, 9:16 AM

## 2014-01-18 NOTE — Patient Instructions (Signed)
[facility] Discharge Instructions for Patients Receiving Chemotherapy  Today you received the following chemotherapy agents Taxotere, Cytoxan  To help prevent nausea and vomiting after your treatment, we encourage you to take your nausea medication as prescribed/directed.   If you develop nausea and vomiting that is not controlled by your nausea medication, call the clinic.   BELOW ARE SYMPTOMS THAT SHOULD BE REPORTED IMMEDIATELY:  *FEVER GREATER THAN 100.5 F  *CHILLS WITH OR WITHOUT FEVER  NAUSEA AND VOMITING THAT IS NOT CONTROLLED WITH YOUR NAUSEA MEDICATION  *UNUSUAL SHORTNESS OF BREATH  *UNUSUAL BRUISING OR BLEEDING  TENDERNESS IN MOUTH AND THROAT WITH OR WITHOUT PRESENCE OF ULCERS  *URINARY PROBLEMS  *BOWEL PROBLEMS  UNUSUAL RASH Items with * indicate a potential emergency and should be followed up as soon as possible.  Feel free to call the clinic you have any questions or concerns. The clinic phone number is [phone].

## 2014-01-18 NOTE — Patient Instructions (Signed)
Use aquaphor for hands and feet

## 2014-01-19 ENCOUNTER — Telehealth: Payer: Self-pay | Admitting: Oncology

## 2014-01-19 ENCOUNTER — Ambulatory Visit (HOSPITAL_BASED_OUTPATIENT_CLINIC_OR_DEPARTMENT_OTHER): Payer: 59

## 2014-01-19 ENCOUNTER — Telehealth: Payer: Self-pay

## 2014-01-19 VITALS — BP 137/68 | HR 97 | Temp 97.5°F

## 2014-01-19 DIAGNOSIS — C50219 Malignant neoplasm of upper-inner quadrant of unspecified female breast: Secondary | ICD-10-CM

## 2014-01-19 DIAGNOSIS — Z5189 Encounter for other specified aftercare: Secondary | ICD-10-CM

## 2014-01-19 DIAGNOSIS — C50212 Malignant neoplasm of upper-inner quadrant of left female breast: Secondary | ICD-10-CM

## 2014-01-19 MED ORDER — PEGFILGRASTIM INJECTION 6 MG/0.6ML
6.0000 mg | Freq: Once | SUBCUTANEOUS | Status: AC
  Administered 2014-01-19: 6 mg via SUBCUTANEOUS
  Filled 2014-01-19: qty 0.6

## 2014-01-19 NOTE — Patient Instructions (Signed)

## 2014-01-19 NOTE — Telephone Encounter (Signed)
Pt states that was not the issue--the issue was she does use the vial, but the pen--so she needs the pen to be sent in as well as lancets--please advise

## 2014-01-19 NOTE — Telephone Encounter (Signed)
solostar is the maker of lantus

## 2014-01-19 NOTE — Telephone Encounter (Signed)
Ok, send in the solastar lantus pens at her current dose and lancets

## 2014-01-19 NOTE — Telephone Encounter (Signed)
Pharmacy sent correspondence in reference to the Lantus pens sent in--They state the pt said it was supposed to be Solostar pens instead--please advise

## 2014-01-19 NOTE — Telephone Encounter (Signed)
per 1/20 pof f/u as scheduled.

## 2014-01-20 ENCOUNTER — Other Ambulatory Visit: Payer: Self-pay | Admitting: Internal Medicine

## 2014-01-20 MED ORDER — INSULIN GLARGINE 100 UNIT/ML SOLOSTAR PEN: 18.0000 [IU] | PEN_INJECTOR | Freq: Every day | SUBCUTANEOUS | Status: DC

## 2014-01-20 NOTE — Telephone Encounter (Signed)
Lantus pens have been sent to pharmacy

## 2014-01-24 ENCOUNTER — Other Ambulatory Visit: Payer: Self-pay

## 2014-01-24 MED ORDER — GLUCOSE BLOOD VI STRP: 1.0000 | ORAL_STRIP | Freq: Every morning | Status: DC

## 2014-01-25 ENCOUNTER — Encounter: Payer: Self-pay | Admitting: Oncology

## 2014-01-25 ENCOUNTER — Telehealth: Payer: Self-pay | Admitting: Oncology

## 2014-01-25 ENCOUNTER — Other Ambulatory Visit: Payer: Self-pay | Admitting: Emergency Medicine

## 2014-01-25 ENCOUNTER — Ambulatory Visit (HOSPITAL_BASED_OUTPATIENT_CLINIC_OR_DEPARTMENT_OTHER): Payer: 59

## 2014-01-25 ENCOUNTER — Other Ambulatory Visit (HOSPITAL_BASED_OUTPATIENT_CLINIC_OR_DEPARTMENT_OTHER): Payer: 59

## 2014-01-25 ENCOUNTER — Ambulatory Visit (HOSPITAL_BASED_OUTPATIENT_CLINIC_OR_DEPARTMENT_OTHER): Payer: 59 | Admitting: Oncology

## 2014-01-25 ENCOUNTER — Telehealth: Payer: Self-pay | Admitting: *Deleted

## 2014-01-25 VITALS — BP 124/79 | HR 113 | Temp 98.3°F | Resp 18 | Ht 68.0 in | Wt 195.6 lb

## 2014-01-25 DIAGNOSIS — C50212 Malignant neoplasm of upper-inner quadrant of left female breast: Secondary | ICD-10-CM

## 2014-01-25 DIAGNOSIS — C50219 Malignant neoplasm of upper-inner quadrant of unspecified female breast: Secondary | ICD-10-CM

## 2014-01-25 DIAGNOSIS — E860 Dehydration: Secondary | ICD-10-CM

## 2014-01-25 DIAGNOSIS — C773 Secondary and unspecified malignant neoplasm of axilla and upper limb lymph nodes: Secondary | ICD-10-CM

## 2014-01-25 DIAGNOSIS — R110 Nausea: Secondary | ICD-10-CM

## 2014-01-25 LAB — CBC WITH DIFFERENTIAL/PLATELET
BASO%: 1.7 % (ref 0.0–2.0)
Basophils Absolute: 0.1 10*3/uL (ref 0.0–0.1)
EOS ABS: 0.1 10*3/uL (ref 0.0–0.5)
EOS%: 2.7 % (ref 0.0–7.0)
HCT: 35.3 % (ref 34.8–46.6)
HGB: 11.4 g/dL — ABNORMAL LOW (ref 11.6–15.9)
LYMPH%: 37.7 % (ref 14.0–49.7)
MCH: 27.1 pg (ref 25.1–34.0)
MCHC: 32.3 g/dL (ref 31.5–36.0)
MCV: 84 fL (ref 79.5–101.0)
MONO#: 0.9 10*3/uL (ref 0.1–0.9)
MONO%: 21.6 % — ABNORMAL HIGH (ref 0.0–14.0)
NEUT%: 36.3 % — ABNORMAL LOW (ref 38.4–76.8)
NEUTROS ABS: 1.5 10*3/uL (ref 1.5–6.5)
NRBC: 2 % — AB (ref 0–0)
Platelets: 262 10*3/uL (ref 145–400)
RBC: 4.2 10*6/uL (ref 3.70–5.45)
RDW: 15.2 % — AB (ref 11.2–14.5)
WBC: 4.1 10*3/uL (ref 3.9–10.3)
lymph#: 1.5 10*3/uL (ref 0.9–3.3)

## 2014-01-25 LAB — COMPREHENSIVE METABOLIC PANEL (CC13)
ALT: 17 U/L (ref 0–55)
ANION GAP: 10 meq/L (ref 3–11)
AST: 12 U/L (ref 5–34)
Albumin: 3.4 g/dL — ABNORMAL LOW (ref 3.5–5.0)
Alkaline Phosphatase: 65 U/L (ref 40–150)
BILIRUBIN TOTAL: 0.88 mg/dL (ref 0.20–1.20)
BUN: 8.7 mg/dL (ref 7.0–26.0)
CHLORIDE: 101 meq/L (ref 98–109)
CO2: 28 meq/L (ref 22–29)
Calcium: 9.5 mg/dL (ref 8.4–10.4)
Creatinine: 0.7 mg/dL (ref 0.6–1.1)
Glucose: 188 mg/dl — ABNORMAL HIGH (ref 70–140)
Potassium: 4 mEq/L (ref 3.5–5.1)
SODIUM: 139 meq/L (ref 136–145)
TOTAL PROTEIN: 6.2 g/dL — AB (ref 6.4–8.3)

## 2014-01-25 MED ORDER — ONDANSETRON 16 MG/50ML IVPB (CHCC)
INTRAVENOUS | Status: AC
Start: 2014-01-25 — End: 2014-01-25
  Filled 2014-01-25: qty 16

## 2014-01-25 MED ORDER — ONDANSETRON 16 MG/50ML IVPB (CHCC)
16.0000 mg | Freq: Once | INTRAVENOUS | Status: AC
  Administered 2014-01-25: 16 mg via INTRAVENOUS

## 2014-01-25 MED ORDER — HEPARIN SOD (PORK) LOCK FLUSH 100 UNIT/ML IV SOLN
500.0000 [IU] | Freq: Once | INTRAVENOUS | Status: AC
  Administered 2014-01-25: 500 [IU] via INTRAVENOUS
  Filled 2014-01-25: qty 5

## 2014-01-25 MED ORDER — SODIUM CHLORIDE 0.9 % IV SOLN
INTRAVENOUS | Status: DC
  Administered 2014-01-25: 15:00:00 via INTRAVENOUS

## 2014-01-25 MED ORDER — SODIUM CHLORIDE 0.9 % IJ SOLN
10.0000 mL | INTRAMUSCULAR | Status: DC | PRN
  Administered 2014-01-25: 10 mL via INTRAVENOUS
  Filled 2014-01-25: qty 10

## 2014-01-25 NOTE — Patient Instructions (Signed)
Dehydration, Adult  Dehydration means your body does not have as much fluid as it needs. Your kidneys, brain, and heart will not work properly without the right amount of fluids and salt.   HOME CARE   Ask your doctor how to replace body fluid losses (rehydrate).   Drink enough fluids to keep your pee (urine) clear or pale yellow.   Drink small amounts of fluids often if you feel sick to your stomach (nauseous) or throw up (vomit).   Eat like you normally do.   Avoid:   Foods or drinks high in sugar.   Bubbly (carbonated) drinks.   Juice.   Very hot or cold fluids.   Drinks with caffeine.   Fatty, greasy foods.   Alcohol.   Tobacco.   Eating too much.   Gelatin desserts.   Wash your hands to avoid spreading germs (bacteria, viruses).   Only take medicine as told by your doctor.   Keep all doctor visits as told.  GET HELP RIGHT AWAY IF:    You cannot drink something without throwing up.   You get worse even with treatment.   Your vomit has blood in it or looks greenish.   Your poop (stool) has blood in it or looks black and tarry.   You have not peed in 6 to 8 hours.   You pee a small amount of very dark pee.   You have a fever.   You pass out (faint).   You have belly (abdominal) pain that gets worse or stays in one spot (localizes).   You have a rash, stiff neck, or bad headache.   You get easily annoyed, sleepy, or are hard to wake up.   You feel weak, dizzy, or very thirsty.  MAKE SURE YOU:    Understand these instructions.   Will watch your condition.   Will get help right away if you are not doing well or get worse.  Document Released: 10/12/2009 Document Revised: 03/09/2012 Document Reviewed: 08/05/2011  [employer] Patient Information 2014 [employer]

## 2014-01-25 NOTE — Progress Notes (Signed)
Veronica Rogers [id number] 18-May-1961 53 y.o. 01/25/2014 11:22 AM  CC Veronica Lamer, MD [address] [place] [address]  DIAGNOSIS:  53 year old female with left breast cancer stage IA.     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 Rolm Rogers  PRIOR ONCOLOGIC HISTORY:  Veronica Rogers is a 53 y.o. female.  with  #1Patient had a screening mammogram performed that revealed a left breast mass measuring 9 mm. By ultrasound it was 9 mm at the 11:30 o'clock position 8 cm from the nipple.  She did not have an MRI performed. 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 Patient's case was discussed at the multidisciplinary breast conference. She is now seen in the multidisciplinary breast clinic for discussion of treatment options. She was seen by Veronica Rogers as well as Veronica Rogers.  #2 Patient is now status post lumpectomy performed on 10/29/2013. Her final pathology did reveal grade 2, 0.9 cm invasive ductal carcinoma that was ER positive PR positive. One sentinel node was positive for micrometastasis.  #3 Patient also had an Oncotype DX testing done and her recurrence score was 29 giving her a 17% risk of distant recurrence.  #4 patient began Taxotere Cytoxan on 12/07/2013 total of 4 cycles of chemotherapy is planned  CURRENT THERAPY: cycle 3 day 8 Taxotere Cytoxan adjuvantly  INTERVAL HISTORY: Patient is seen in followup today for cycle 3 day 8. She has had a rough course with the third cycle of her chemotherapy. Today she is tired weak fatigued. She has not been eating or drinking much. She states that she has not been  able to get out of bed much and has missed work. She has had some nausea but no vomiting. She denies having any fevers chills or night sweats. She has some grade 1 neuropathy with numbness but she has not experienced any pins and needle sensation. She denies any abdominal pain no diarrhea or constipation no vaginal bleeding or discharge. Remainder of the 10 point review of systems is negative.  Past Medical History: Past Medical History  Diagnosis Date  . Breast cancer   . Anxiety   . Depression   . Contact lens/glasses fitting     wears contacts or glasses    Past Surgical History: Past Surgical History  Procedure Laterality Date  . Breast biopsy  1996    right breast, benign  . Wisdom tooth extraction    . Colonoscopy    . Breast lumpectomy with needle localization and axillary sentinel lymph node bx Left 10/29/2013    Procedure: BREAST LUMPECTOMY WITH NEEDLE LOCALIZATION AND AXILLARY SENTINEL LYMPH NODE BX;  Surgeon: Veronica Bookbinder, MD;  Location: [facility];  Service: General;  Laterality: Left;    Family History: Family History  Problem Relation Age of Onset  . Squamous cell carcinoma Father   . Cancer Father   . Diabetes Father   . Hypertension Mother   . Diabetes Sister     Social History History  Substance Use Topics  . Smoking status: Never Smoker   . Smokeless tobacco: Never Used  . Alcohol Use: Yes  Comment: 1-2 drinks a month    Allergies: Allergies  Allergen Reactions  . Erythromycin Other (See Comments)    Stomach pain  . Tape Rash    Current Medications: Current Outpatient Prescriptions  Medication Sig Dispense Refill  . dexamethasone (DECADRON) 4 MG tablet Take 2 tablets (24ms) twice a day the day before Taxotere. Then take 2 tablets (874m) twice a day starting the day after Taxotere for 3 days.  30 tablet  1  . glucose blood (TRUETEST TEST) test strip 1 each by Other route every morning. 1 Box  50 each  3  . Insulin Glargine  (LANTUS) 100 UNIT/ML Solostar Pen Inject 18 Units into the skin at bedtime.  2 pen  11  . Insulin Pen Needle (CLICKFINE PEN NEEDLES) 32G X 4 MM MISC Inject 1 each into the skin daily.  50 each  1  . lidocaine-prilocaine (EMLA) cream Apply 1 application topically as needed.  30 g  1  . LORazepam (ATIVAN) 0.5 MG tablet Take 1 tablet (0.5 mg) every 6 hours as needed for nausea or vomiting.  30 tablet  0  . ondansetron (ZOFRAN) 8 MG tablet Take two times a day starting the day after chemotherapy for 3 days. Then take two times a day as needed for nausea or vomiting.  30 tablet  1  . prochlorperazine (COMPAZINE) 10 MG tablet Take 1 tablet (10 mg total) by mouth every 6 (six) hours as needed for nausea or vomiting.  30 tablet  1  . sertraline (ZOLOFT) 50 MG tablet Take 25 mg by mouth daily.       . 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  1 Units  0   No current facility-administered medications for this visit.   REVIEW OF SYSTEMS: A 10 point review of systems was conducted and is otherwise negative except for what is noted above.    PHYSICAL EXAMINATION: Blood pressure 124/79, pulse 113, temperature 98.3 F (36.8 C), temperature source Oral, resp. rate 18, height _0  (1.727 m), weight 195 lb 9.6 oz (88.724 kg). GENERAL: Patient is a well appearing female in no acute distress HEENT:  Sclerae anicteric.  Oropharynx clear and moist. No ulcerations or evidence of oropharyngeal candidiasis. Neck is supple.  NODES:  No cervical, supraclavicular, or axillary lymphadenopathy palpated.  BREAST EXAM:  Deferred. LUNGS:  Clear to auscultation bilaterally.  No wheezes or rhonchi. HEART:  Regular rate and rhythm. No murmur appreciated. ABDOMEN:  Soft, nontender.  Positive, normoactive bowel sounds. No organomegaly palpated. MSK:  No focal spinal tenderness to palpation. Full range of motion bilaterally in the upper extremities. EXTREMITIES:  No peripheral edema.   SKIN:  Clear  with no obvious rashes or skin changes. No nail dyscrasia. NEURO:  Nonfocal. Well oriented.  Appropriate affect. ECOG PERFORMANCE STATUS: 0 - Asymptomatic   STUDIES/RESULTS: UsKoreareast Left  1010/30/2014 CLINICAL DATA:  Patient presents for additional views of the left breast as followup to a recent screening exam suggesting a possible mass.  EXAM: DIGITAL DIAGNOSTIC UNILATERAL LEFT MAMMOGRAM LIMITED; ULTRASOUND LEFT BREAST  COMPARISON:  09/01/2012, 09/27/2011, 07/19/2010, 05/26/2009, 04/01/2008 and 02/05/2007  ACR Breast Density Category b: There are scattered areas of fibroglandular density.  FINDINGS: Spot compression views demonstrate a spiculated 1 cm mass over the slightly inner upper left breast.  Ultrasound is performed, showing an irregular bordered hypoechoic solid mass with minimal shadowing at the 11:30 position of the left breast 8 cm from the nipple  measuring 8 x 9 x 9 mm. Ultrasound of the left axilla demonstrates no abnormal appearing lymph nodes.  IMPRESSION: Suspicious 8 x 9 x 9 mm irregular bordered hypoechoic mass at the 11:30 position of the left breast 8 cm from the nipple.  RECOMMENDATION: Recommend ultrasound-guided core needle biopsy of the suspicious mass.  I have discussed the findings and recommendations with the patient. Results were also provided in writing at the conclusion of the visit. If applicable, a reminder letter will be sent to the patient regarding the next appointment.  BI-RADS CATEGORY  5: Highly suggestive of malignancy - appropriate action should be taken.  Biopsy will be done today.   Electronically Signed   By: Elberta Fortis M.D.   On: 10/11/2013 16:33   Mm Digital Diag Ltd L  10/11/2013   CLINICAL DATA:  Patient presents for additional views of the left breast as followup to a recent screening exam suggesting a possible mass.  EXAM: DIGITAL DIAGNOSTIC UNILATERAL LEFT MAMMOGRAM LIMITED; ULTRASOUND LEFT BREAST  COMPARISON:  09/01/2012, 09/27/2011, 07/19/2010,  05/26/2009, 04/01/2008 and 02/05/2007  ACR Breast Density Category b: There are scattered areas of fibroglandular density.  FINDINGS: Spot compression views demonstrate a spiculated 1 cm mass over the slightly inner upper left breast.  Ultrasound is performed, showing an irregular bordered hypoechoic solid mass with minimal shadowing at the 11:30 position of the left breast 8 cm from the nipple measuring 8 x 9 x 9 mm. Ultrasound of the left axilla demonstrates no abnormal appearing lymph nodes.  IMPRESSION: Suspicious 8 x 9 x 9 mm irregular bordered hypoechoic mass at the 11:30 position of the left breast 8 cm from the nipple.  RECOMMENDATION: Recommend ultrasound-guided core needle biopsy of the suspicious mass.  I have discussed the findings and recommendations with the patient. Results were also provided in writing at the conclusion of the visit. If applicable, a reminder letter will be sent to the patient regarding the next appointment.  BI-RADS CATEGORY  5: Highly suggestive of malignancy - appropriate action should be taken.  Biopsy will be done today.   Electronically Signed   By: Elberta Fortis M.D.   On: 10/11/2013 16:33   Mm Digital Diagnostic Unilat L  10/11/2013   CLINICAL DATA:  The patient is post ultrasound-guided core biopsy of a 9 mm spiculated mass over the 11:30 position of the left breast.  EXAM: DIGITAL DIAGNOSTIC UNILATERAL LEFT MAMMOGRAM  COMPARISON:  Previous exams  FINDINGS: Mammographic images were obtained following ultrasound guided biopsy of an irregular 9 mm mass of the 11:30 position of the left breast. Exam demonstrates satisfactory placement of a cylindrical shaped metallic clip over the biopsied mass in the upper inner left breast.  IMPRESSION: Satisfactory clip placement post ultrasound core biopsy left breast.  Final Assessment: Post Procedure Mammograms for Marker Placement   Electronically Signed   By: Elberta Fortis M.D.   On: 10/11/2013 17:04   Mm Radiologist Eval And  Mgmt  10/12/2013   EXAM: ESTABLISHED PATIENT OFFICE VISIT -LEVEL II (43606)  HISTORY OF PRESENT ILLNESS: Screening detected left breast mass. Ultrasound-guided core needle biopsy was performed yesterday. Pathology returned as grade 2-3 invasive ductal carcinoma.  Patient denies significant pain or bleeding at the biopsy site.  CHIEF COMPLAINT: Patient returns 1 day post ultrasound-guided core needle biopsy of a screening detected 0.9 cm mass in the upper left breast.  PHYSICAL EXAMINATION: Biopsy site in the upper left breast is clean without evidence of bleeding from the incision.  The breast is soft on palpation and there is no evidence of hematoma.  ASSESSMENT AND PLAN: 0.9 cm mass in the upper left breast, pathology from ultrasound-guided core needle biopsy revealing grade 2-3 invasive ductal carcinoma, concordant with imaging findings.  Patient is scheduled for the Multidisciplinary Breast Cancer Clinic at the [facility] at [facility] on Wednesday, October 22.   Electronically Signed   By: Evangeline Dakin M.D.   On: 10/12/2013 17:08   [place] Lt Breast Bx W Loc Dev 1st Lesion Img Bx Spec US Guide  10/11/2013   CLINICAL DATA:  Patient presents for ultrasound-guided core needle biopsy of a 9 mm suspicious mass at the 11:30 position of the left breast 8 cm from the nipple.  EXAM: RADIOLOGY EXAMINATION  COMPARISON:  Previous exams.  PROCEDURE: I met with the patient and we discussed the procedure of ultrasound-guided biopsy, including benefits and alternatives. We discussed the high likelihood of a successful procedure. We discussed the risks of the procedure including infection, bleeding, tissue injury, clip migration, and inadequate sampling. Informed written consent was given.  Using sterile technique and 2% Lidocaine as local anesthetic, under direct ultrasound visualization, a 12 gauge vacuum-assisteddevice was used to perform biopsy of the targeted mass at the 11:30 position using a  lateral to medial approach. At the conclusion of the procedure, a top hat shaped tissue marker clip was deployed into the biopsy cavity. Follow-up 2-view mammogram was performed and dictated separately.  The usual time-out protocol was performed immediately prior to the procedure.  IMPRESSION: Ultrasound-guided biopsy of a suspicious left breast mass. No apparent complications.   Electronically Signed   By: Marin Olp M.D.   On: 10/11/2013 16:41     LABS:    Chemistry      Component Value Date/Time   NA 139 01/25/2014 1017   NA 140 04/18/2011 1136   K 4.0 01/25/2014 1017   K 3.8 04/18/2011 1136   CL 103 04/18/2011 1136   CO2 28 01/25/2014 1017   CO2 28 04/18/2011 1136   BUN 8.7 01/25/2014 1017   BUN 13 04/18/2011 1136   CREATININE 0.7 01/25/2014 1017   CREATININE 0.6 04/18/2011 1136      Component Value Date/Time   CALCIUM 9.5 01/25/2014 1017   CALCIUM 9.3 04/18/2011 1136   ALKPHOS 65 01/25/2014 1017   AST 12 01/25/2014 1017   ALT 17 01/25/2014 1017   BILITOT 0.88 01/25/2014 1017      Lab Results  Component Value Date   WBC 4.1 01/25/2014   HGB 11.4* 01/25/2014   HCT 35.3 01/25/2014   MCV 84.0 01/25/2014   PLT 262 01/25/2014   PATHOLOGY: ADDITIONAL INFORMATION: 1. A sample (block 1B) was sent to [facility] for Oncotype testing. The patient's recurrence score is 29. Those patients who had a recurrence score of 29 had an average rate of distant recurrence of 17%. Enid Cutter MD Pathologist, Electronic Signature ( Signed 11/22/2013) 1. CHROMOGENIC IN-SITU HYBRIDIZATION Results: HER-2/NEU BY CISH - NO AMPLIFICATION OF HER-2 DETECTED. RESULT RATIO OF HER2: CEP 17 SIGNALS 0.95 AVERAGE HER2 COPY NUMBER PER CELL 1.95 REFERENCE RANGE NEGATIVE HER2/Chr17 Ratio <2.0 and Average HER2 copy number <4.0 EQUIVOCAL HER2/Chr17 Ratio <2.0 and Average HER2 copy number 4.0 and <6.0 POSITIVE HER2/Chr17 Ratio >=2.0 and/or Average HER2 copy number >=6.0 Enid Cutter MD Pathologist, Electronic  Signature ( Signed 11/04/2013) FINAL DIAGNOSIS Diagnosis 1. Breast, lumpectomy, Left - INVASIVE DUCTAL CARCINOMA, SEE COMMENT. 1 of 4 FINAL for Hirt,  Takela C [id number]) Diagnosis(continued) - LYMPHOVASCULAR INVASION IDENTIFIED. - INVASIVE TUMOR IS 1 MM FROM NEAREST MARGIN (MEDIAL). - PREVIOUS BIOPSY SITE IDENTIFIED. - SEE TUMOR TEMPLATE BELOW. 2. Breast, excision, left - BENIGN BREAST TISSUE, SEE COMMENT. - NEGATIVE FOR ATYPIA OR MALIGNANCY. - SURGICAL MARGINS, NEGATIVE FOR ATYPIA OR MALIGNANCY. 3. Lymph node, sentinel, biopsy, #1 left Axilla - ONE LYMPH NODE, POSITIVE FOR METASTATIC MAMMARY CARCINOMA (1/1). - TUMOR DEPOSIT IS 1 MM. - NO EXTRACAPSULAR TUMOR EXTENSION IDENTIFIED. 4. Lymph nodes, regional resection, left Axilla - THREE LYMPH NODES, NEGATIVE FOR TUMOR (0/3). 5. Lymph node, sentinel, biopsy, #2 left - ONE LYMPH NODE, NEGATIVE FOR TUMOR (0/1). 6. Lymph node, sentinel, biopsy, #3 left - ONE LYMPH NODE, NEGATIVE FOR TUMOR (0/1). 7. Lymph node, sentinel, biopsy, #4 left - ONE LYMPH NODE, NEGATIVE FOR TUMOR (0/1). Microscopic Comment 1. BREAST, INVASIVE TUMOR, WITH LYMPH NODE SAMPLING Specimen, including laterality and lymph node sampling (sentinel, non-sentinel): Left breast plus sentinel lymph nodes Procedure: Lumpectomy Histologic type: Ductal Grade: II of III Tubule formation: II Nuclear pleomorphism: II Mitotic:II Tumor size (gross measurement): 0.9 cm Margins: Invasive, distance to closest margin: 1 mm In-situ, distance to closest margin: N/A If margin positive, focally or broadly: N/A Lymphovascular invasion: Present Ductal carcinoma in situ: Absent Grade: N/A Extensive intraductal component: N/A Lobular neoplasia: Absent Tumor focality: Unifocal Treatment effect: None If present, treatment effect in breast tissue, lymph nodes or both: N/A Extent of tumor: Skin: N/A Nipple: N/A Skeletal muscle: N/A Lymph nodes: Examined: 4 Sentinel 3  Non-sentinel 7 Total 2 of 4 FINAL for PEYTON, ROSSNER [phone]) Microscopic Comment(continued) Lymph nodes with metastasis: 1 Isolated tumor cells (< 0.2 mm): N/A Micrometastasis: (> 0.2 mm and < 2.0 mm): 1 = 1 mm Macrometastasis: (> 2.0 mm): N/A Extracapsular extension: Absent Breast prognostic profile: Estrogen receptor: Not repeated, previous study demonstrated 100% positivity ([id number]) Progesterone receptor: Not repeated, previous study demonstrated 100% positivity ([id number]) Her 2 neu: Repeated, previous study demonstrated no amplification (1.85) ([id number]) Ki-67: Not repeated, previous study demonstrated 46% proliferative rate ([id number]) Non-neoplastic breast: Previous biopsy site TNM: pT1b, pN1, pMX Comments: None 2. There is no mass grossly identified. Representative section, including margins, demonstrate a nonneoplastic to include fibrocystic change, pseudoangiomatous stromal hyperplasia, and usual ductal hyperplasia. There are no atypical or malignant epithelial or stromal features present. Mali RUND DO Pathologist, Electronic Signature (Case signed 11/02/2013) Intraoperative Diagnosis RAPID INTRAOPERAS  ASSESSMENT/PLAN:    54 year old female with  #1 a screen detected left breast mass measuring 9 mm by ultrasound at the 11:30 o'clock position 8 cm from the nipple. She did not get an MRI. 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   #2 final pathology revealed grade 2, 0.9 cm invasive ductal carcinoma one sentinel node was positive for metastatic disease. Tumor ER positive PR positive HER-2/neu negative with an elevated Ki-67.  #3 Oncotype DX testing recurrence score 29 giving her a 17% risk of distant recurrence at 5 years with tamoxifen alone. I have recommended adjuvant curative intent chemotherapy consisting of  Taxotere Cytoxan for a total of 4 cycles. Risks benefits and side effects were discussed with the patient.  #4 patient is now status post cycle 3 of adjuvant curative intent Taxotere Cytoxan administered on January 22,015. She today does complain of weakness fatigue. She is dehydrated she has not been eating and drinking much. Her blood work looks good.  #  5 dehydration: We will give her IV fluids today. She will also return tomorrow for IV fluids.  #6 nausea: She will receive IV Zofran today. She is encouraged to keep her fluid intake as well as continued to take her antiemetics and breast.  #7 patient will return in 2 weeks for cycle #4 of TC.  All questions were answered. The patient knows to call the clinic with any problems, questions or concerns. We can certainly see the patient much sooner if necessary.  I spent 25 minutes counseling the patient face to face. The total time spent in the appointment was 30 minutes.  Marcy Panning, MD Medical/Oncology [facility] [phone] (beeper) [phone] (Office)  01/25/2014, 11:22 AM

## 2014-01-25 NOTE — Telephone Encounter (Signed)
Per staff message and POF I have scheduled appts.  JMW  

## 2014-01-26 ENCOUNTER — Ambulatory Visit (HOSPITAL_BASED_OUTPATIENT_CLINIC_OR_DEPARTMENT_OTHER): Payer: 59

## 2014-01-26 VITALS — BP 120/65 | HR 108 | Temp 97.9°F | Resp 18

## 2014-01-26 DIAGNOSIS — C50219 Malignant neoplasm of upper-inner quadrant of unspecified female breast: Secondary | ICD-10-CM

## 2014-01-26 DIAGNOSIS — E860 Dehydration: Secondary | ICD-10-CM

## 2014-01-26 MED ORDER — SODIUM CHLORIDE 0.9 % IJ SOLN
10.0000 mL | Freq: Once | INTRAMUSCULAR | Status: AC
  Administered 2014-01-26: 10 mL
  Filled 2014-01-26: qty 10

## 2014-01-26 MED ORDER — HEPARIN SOD (PORK) LOCK FLUSH 100 UNIT/ML IV SOLN
500.0000 [IU] | Freq: Once | INTRAVENOUS | Status: AC
  Administered 2014-01-26: 500 [IU]
  Filled 2014-01-26: qty 5

## 2014-01-26 MED ORDER — SODIUM CHLORIDE 0.9 % IV SOLN
INTRAVENOUS | Status: DC
  Administered 2014-01-26: 09:00:00 via INTRAVENOUS

## 2014-01-26 NOTE — Patient Instructions (Signed)
Dehydration, Adult Dehydration is when you lose more fluids from the body than you take in. Vital organs like the kidneys, brain, and heart cannot function without a proper amount of fluids and salt. Any loss of fluids from the body can cause dehydration.  CAUSES   Vomiting.  Diarrhea.  Excessive sweating.  Excessive urine output.  Fever. SYMPTOMS  Mild dehydration  Thirst.  Dry lips.  Slightly dry mouth. Moderate dehydration  Very dry mouth.  Sunken eyes.  Skin does not bounce back quickly when lightly pinched and released.  Dark urine and decreased urine production.  Decreased tear production.  Headache. Severe dehydration  Very dry mouth.  Extreme thirst.  Rapid, weak pulse (more than 100 beats per minute at rest).  Cold hands and feet.  Not able to sweat in spite of heat and temperature.  Rapid breathing.  Blue lips.  Confusion and lethargy.  Difficulty being awakened.  Minimal urine production.  No tears. DIAGNOSIS  Your caregiver will diagnose dehydration based on your symptoms and your exam. Blood and urine tests will help confirm the diagnosis. The diagnostic evaluation should also identify the cause of dehydration. TREATMENT  Treatment of mild or moderate dehydration can often be done at home by increasing the amount of fluids that you drink. It is best to drink small amounts of fluid more often. Drinking too much at one time can make vomiting worse. Refer to the home care instructions below. Severe dehydration needs to be treated at the hospital where you will probably be given intravenous (IV) fluids that contain water and electrolytes. HOME CARE INSTRUCTIONS   Ask your caregiver about specific rehydration instructions.  Drink enough fluids to keep your urine clear or pale yellow.  Drink small amounts frequently if you have nausea and vomiting.  Eat as you normally do.  Avoid:  Foods or drinks high in sugar.  Carbonated  drinks.  Juice.  Extremely hot or cold fluids.  Drinks with caffeine.  Fatty, greasy foods.  Alcohol.  Tobacco.  Overeating.  Gelatin desserts.  Wash your hands well to avoid spreading bacteria and viruses.  Only take over-the-counter or prescription medicines for pain, discomfort, or fever as directed by your caregiver.  Ask your caregiver if you should continue all prescribed and over-the-counter medicines.  Keep all follow-up appointments with your caregiver. SEEK MEDICAL CARE IF:  You have abdominal pain and it increases or stays in one area (localizes).  You have a rash, stiff neck, or severe headache.  You are irritable, sleepy, or difficult to awaken.  You are weak, dizzy, or extremely thirsty. SEEK IMMEDIATE MEDICAL CARE IF:   You are unable to keep fluids down or you get worse despite treatment.  You have frequent episodes of vomiting or diarrhea.  You have blood or green matter (bile) in your vomit.  You have blood in your stool or your stool looks black and tarry.  You have not urinated in 6 to 8 hours, or you have only urinated a small amount of very dark urine.  You have a fever.  You faint. MAKE SURE YOU:   Understand these instructions.  Will watch your condition.  Will get help right away if you are not doing well or get worse. Document Released: 12/16/2005 Document Revised: 03/09/2012 Document Reviewed: 08/05/2011 Exit[employer] Patient Information 2014 [employer].  

## 2014-01-28 ENCOUNTER — Ambulatory Visit (HOSPITAL_BASED_OUTPATIENT_CLINIC_OR_DEPARTMENT_OTHER): Payer: 59 | Admitting: Hematology and Oncology

## 2014-01-28 ENCOUNTER — Other Ambulatory Visit: Payer: Self-pay | Admitting: *Deleted

## 2014-01-28 ENCOUNTER — Ambulatory Visit (HOSPITAL_COMMUNITY)
Admission: RE | Admit: 2014-01-28 | Discharge: 2014-01-28 | Disposition: A | Discharge status: Home / Self Care | Payer: 59 | Source: Ambulatory Visit | Attending: Hematology and Oncology | Admitting: Hematology and Oncology

## 2014-01-28 ENCOUNTER — Encounter: Payer: Self-pay | Admitting: Oncology

## 2014-01-28 ENCOUNTER — Telehealth: Payer: Self-pay | Admitting: Oncology

## 2014-01-28 VITALS — BP 143/78 | HR 98 | Temp 98.6°F | Resp 18 | Ht 68.0 in | Wt 201.1 lb

## 2014-01-28 DIAGNOSIS — M79609 Pain in unspecified limb: Secondary | ICD-10-CM

## 2014-01-28 DIAGNOSIS — M79606 Pain in leg, unspecified: Secondary | ICD-10-CM

## 2014-01-28 DIAGNOSIS — M25476 Effusion, unspecified foot: Secondary | ICD-10-CM | POA: Insufficient documentation

## 2014-01-28 DIAGNOSIS — L539 Erythematous condition, unspecified: Secondary | ICD-10-CM

## 2014-01-28 DIAGNOSIS — C50219 Malignant neoplasm of upper-inner quadrant of unspecified female breast: Secondary | ICD-10-CM

## 2014-01-28 DIAGNOSIS — C50212 Malignant neoplasm of upper-inner quadrant of left female breast: Secondary | ICD-10-CM

## 2014-01-28 DIAGNOSIS — L278 Dermatitis due to other substances taken internally: Secondary | ICD-10-CM

## 2014-01-28 DIAGNOSIS — M7989 Other specified soft tissue disorders: Secondary | ICD-10-CM | POA: Insufficient documentation

## 2014-01-28 DIAGNOSIS — M25473 Effusion, unspecified ankle: Secondary | ICD-10-CM | POA: Insufficient documentation

## 2014-01-28 DIAGNOSIS — Z170 Estrogen receptor positive status [ER+]: Secondary | ICD-10-CM

## 2014-01-28 MED ORDER — COTTON GLOVES MEDIUM MISC: 2.0000 | Freq: Two times a day (BID) | Status: DC

## 2014-01-28 MED ORDER — CALAMINE EX LOTN: 1.0000 "application " | TOPICAL_LOTION | Freq: Two times a day (BID) | CUTANEOUS | Status: DC

## 2014-01-28 MED ORDER — VITAMIN B-6 50 MG PO TABS: 50.0000 mg | ORAL_TABLET | Freq: Every day | ORAL | Status: DC

## 2014-01-28 NOTE — Progress Notes (Signed)
Veronica Rogers [id number] 1961-01-04 53 y.o. 01/28/2014 3:17 PM  CC Darene Lamer, MD [place] [address]  DIAGNOSIS:  53 year old female with left breast cancer stage IA.     STAGE:   Breast cancer of upper-inner quadrant of left female breast   Primary site: Breast (Left)   Staging method: AJCC 7th Edition   Clinical: Stage IA (T1b, N0, cM0)   Summary: Stage IA (T1b, N0, cM0)  REFERRING PHYSICIAN:  Chief complaint: Bilateral lower extremity swelling and pain   Dr. Rolm Bookbinder  PRIOR ONCOLOGIC HISTORY:  Veronica Rogers is a 53 y.o. female.  with  #1Patient had a screening mammogram performed that revealed a left breast mass measuring 9 mm. By ultrasound it was 9 mm at the 11:30 o'clock position 8 cm from the nipple.  She did not have an MRI performed. 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 Patient's case was discussed at the multidisciplinary breast conference. She is now seen in the multidisciplinary breast clinic for discussion of treatment options. She was seen by Dr. Rolm Bookbinder as well as Dr. Gery Pray.  #2 Patient is now status post lumpectomy performed on 10/29/2013. Her final pathology did reveal grade 2, 0.9 cm invasive ductal carcinoma that was ER positive PR positive. One sentinel node was positive for micrometastasis.  #3 Patient also had an Oncotype DX testing done and her recurrence score was 29 giving her a 17% risk of distant recurrence.  #4 patient began Taxotere Cytoxan on 12/07/2013 total of 4 cycles of chemotherapy is planned  CURRENT THERAPY: s/p completion of cycle 3 adjuvant chemotherapy with Taxotere and Cytoxan  on 01/18/2014     INTERVAL HISTORY:  Veronica Rogers is here with the complaints of bilateral lower extremity swelling right more than the left and also  burning sensation in both the feet progressively getting worse for the past 2-3 days. She denies any fever shortness of breath, chest pain, palpitations, denies any constipation or blood in the stool blood in the urine. She has completed cycle 3 chemotherapy with Taxotere and Cytoxan on 01/18/2014. She says that she missed taking dexamethasone the day prior to last chemotherapy. She is able to eat okay without any swallowing problems other than the change in taste  She has some grade 1 neuropathy with numbness but she has not experienced any pins and needle sensation.   Past Medical History: Past Medical History  Diagnosis Date  . Breast cancer   . Anxiety   . Depression   . Contact lens/glasses fitting     wears contacts or glasses    Past Surgical History: Past Surgical History  Procedure Laterality Date  . Breast biopsy  1996    right breast, benign  . Wisdom tooth extraction    . Colonoscopy    . Breast lumpectomy with needle localization and axillary sentinel lymph node bx Left 10/29/2013    Procedure: BREAST LUMPECTOMY WITH NEEDLE LOCALIZATION AND AXILLARY SENTINEL LYMPH NODE BX;  Surgeon: Rolm Bookbinder, MD;  Location: [facility];  Service: General;  Laterality: Left;    Family History: Family History  Problem Relation Age of Onset  . Squamous cell carcinoma Father   . Cancer Father   . Diabetes Father   . Hypertension Mother   . Diabetes Sister     Social History History  Substance  Use Topics  . Smoking status: Never Smoker   . Smokeless tobacco: Never Used  . Alcohol Use: Yes     Comment: 1-2 drinks a month    Allergies: Allergies  Allergen Reactions  . Erythromycin Other (See Comments)    Stomach pain  . Tape Rash    Current Medications: Current Outpatient Prescriptions  Medication Sig Dispense Refill  . glucose blood (TRUETEST TEST) test strip 1 each by Other route every morning. 1 Box  50 each  3  . Insulin Glargine (LANTUS) 100  UNIT/ML Solostar Pen Inject 18 Units into the skin at bedtime.  2 pen  11  . Insulin Pen Needle (CLICKFINE PEN NEEDLES) 32G X 4 MM MISC Inject 1 each into the skin daily.  50 each  1  . lidocaine-prilocaine (EMLA) cream Apply 1 application topically as needed.  30 g  1  . LORazepam (ATIVAN) 0.5 MG tablet Take 1 tablet (0.5 mg) every 6 hours as needed for nausea or vomiting.  30 tablet  0  . sertraline (ZOLOFT) 50 MG tablet Take 25 mg by mouth daily.       . calamine lotion Apply 1 application topically 2 (two) times daily. Apply twice  Daily to the affected area for one week  120 mL  0  . dexamethasone (DECADRON) 4 MG tablet Take 2 tablets ($RemoveBe'8mg'pfeXxNTnY$ s) twice a day the day before Taxotere. Then take 2 tablets ($RemoveBe'8mg'BpyQCcPhy$ s) twice a day starting the day after Taxotere for 3 days.  30 tablet  1  . Disposable Gloves (COTTON GLOVES MEDIUM) MISC 2 each by Does not apply route 2 (two) times daily.  4 each  0  . ondansetron (ZOFRAN) 8 MG tablet Take two times a day starting the day after chemotherapy for 3 days. Then take two times a day as needed for nausea or vomiting.  30 tablet  1  . prochlorperazine (COMPAZINE) 10 MG tablet Take 1 tablet (10 mg total) by mouth every 6 (six) hours as needed for nausea or vomiting.  30 tablet  1  . pyridOXINE (VITAMIN B-6) 50 MG tablet Take 1 tablet (50 mg total) by mouth daily.  30 tablet  0  . UNABLE TO FIND Cranial prothesis due to chemotherapy induced alopecia.  1 Units  0   No current facility-administered medications for this visit.   REVIEW OF SYSTEMS: A 10 point review of systems was conducted and is otherwise negative except for what is noted above.    PHYSICAL EXAMINATION: Blood pressure 143/78, pulse 98, temperature 98.6 F (37 C), temperature source Oral, resp. rate 18, height $RemoveBe'5\' 8"'JgHgzLgqa$  (1.727 m), weight 201 lb 1.6 oz (91.218 kg). GENERAL: Patient is a well appearing female in no acute distress HEENT:  Sclerae anicteric.  Oropharynx clear and moist. No ulcerations or  evidence of oropharyngeal candidiasis. Neck is supple.  NODES:  No cervical, supraclavicular, or axillary lymphadenopathy palpated.  BREAST EXAM:  Deferred. LUNGS:  Clear to auscultation bilaterally.  No wheezes or rhonchi. HEART:  Regular rate and rhythm. No murmur appreciated. ABDOMEN:  Soft, nontender.  Positive, normoactive bowel sounds. No organomegaly palpated. MSK:  No focal spinal tenderness to palpation. Full range of motion bilaterally in the upper extremities. EXTREMITIES:  Bilateral lower extremity swelling right more than the left.  SKIN:  Bilateral pedal erythema, dry skin and some  exfoliation of the skin was noted on the feet. Slight palmar erythema dry skin and fissures in both the thumbs were noted  NEURO:  Nonfocal.  Well oriented.  Appropriate affect. ECOG PERFORMANCE STATUS: 0 - Asymptomatic   STUDIES/RESULTS: US Breast Left  04-Nov-2013   CLINICAL DATA:  Patient presents for additional views of the left breast as followup to a recent screening exam suggesting a possible mass.  EXAM: DIGITAL DIAGNOSTIC UNILATERAL LEFT MAMMOGRAM LIMITED; ULTRASOUND LEFT BREAST  COMPARISON:  09/01/2012, 09/27/2011, 07/19/2010, 05/26/2009, 04/01/2008 and 02/05/2007  ACR Breast Density Category b: There are scattered areas of fibroglandular density.  FINDINGS: Spot compression views demonstrate a spiculated 1 cm mass over the slightly inner upper left breast.  Ultrasound is performed, showing an irregular bordered hypoechoic solid mass with minimal shadowing at the 11:30 position of the left breast 8 cm from the nipple measuring 8 x 9 x 9 mm. Ultrasound of the left axilla demonstrates no abnormal appearing lymph nodes.  IMPRESSION: Suspicious 8 x 9 x 9 mm irregular bordered hypoechoic mass at the 11:30 position of the left breast 8 cm from the nipple.  RECOMMENDATION: Recommend ultrasound-guided core needle biopsy of the suspicious mass.  I have discussed the findings and recommendations with the  patient. Results were also provided in writing at the conclusion of the visit. If applicable, a reminder letter will be sent to the patient regarding the next appointment.  BI-RADS CATEGORY  5: Highly suggestive of malignancy - appropriate action should be taken.  Biopsy will be done today.   Electronically Signed   By: Marin Olp M.D.   On: Nov 04, 2013 16:33   [place] L  11-04-13   CLINICAL DATA:  Patient presents for additional views of the left breast as followup to a recent screening exam suggesting a possible mass.  EXAM: DIGITAL DIAGNOSTIC UNILATERAL LEFT MAMMOGRAM LIMITED; ULTRASOUND LEFT BREAST  COMPARISON:  09/01/2012, 09/27/2011, 07/19/2010, 05/26/2009, 04/01/2008 and 02/05/2007  ACR Breast Density Category b: There are scattered areas of fibroglandular density.  FINDINGS: Spot compression views demonstrate a spiculated 1 cm mass over the slightly inner upper left breast.  Ultrasound is performed, showing an irregular bordered hypoechoic solid mass with minimal shadowing at the 11:30 position of the left breast 8 cm from the nipple measuring 8 x 9 x 9 mm. Ultrasound of the left axilla demonstrates no abnormal appearing lymph nodes.  IMPRESSION: Suspicious 8 x 9 x 9 mm irregular bordered hypoechoic mass at the 11:30 position of the left breast 8 cm from the nipple.  RECOMMENDATION: Recommend ultrasound-guided core needle biopsy of the suspicious mass.  I have discussed the findings and recommendations with the patient. Results were also provided in writing at the conclusion of the visit. If applicable, a reminder letter will be sent to the patient regarding the next appointment.  BI-RADS CATEGORY  5: Highly suggestive of malignancy - appropriate action should be taken.  Biopsy will be done today.   Electronically Signed   By: Marin Olp M.D.   On: Nov 04, 2013 16:33   Mm Digital Diagnostic Unilat L  2013-11-04   CLINICAL DATA:  The patient is post ultrasound-guided core biopsy of a  9 mm spiculated mass over the 11:30 position of the left breast.  EXAM: DIGITAL DIAGNOSTIC UNILATERAL LEFT MAMMOGRAM  COMPARISON:  Previous exams  FINDINGS: Mammographic images were obtained following ultrasound guided biopsy of an irregular 9 mm mass of the 11:30 position of the left breast. Exam demonstrates satisfactory placement of a cylindrical shaped metallic clip over the biopsied mass in the upper inner left breast.  IMPRESSION: Satisfactory clip placement post ultrasound core biopsy left breast.  Final Assessment: Post Procedure Mammograms for Marker Placement   Electronically Signed   By: Marin Olp M.D.   On: 10/11/2013 17:04   Mm Radiologist Eval And Mgmt  10/12/2013   EXAM: ESTABLISHED PATIENT OFFICE VISIT -LEVEL II (54098)  HISTORY OF PRESENT ILLNESS: Screening detected left breast mass. Ultrasound-guided core needle biopsy was performed yesterday. Pathology returned as grade 2-3 invasive ductal carcinoma.  Patient denies significant pain or bleeding at the biopsy site.  CHIEF COMPLAINT: Patient returns 1 day post ultrasound-guided core needle biopsy of a screening detected 0.9 cm mass in the upper left breast.  PHYSICAL EXAMINATION: Biopsy site in the upper left breast is clean without evidence of bleeding from the incision. The breast is soft on palpation and there is no evidence of hematoma.  ASSESSMENT AND PLAN: 0.9 cm mass in the upper left breast, pathology from ultrasound-guided core needle biopsy revealing grade 2-3 invasive ductal carcinoma, concordant with imaging findings.  Patient is scheduled for the Multidisciplinary Breast Cancer Clinic at the [facility] at [facility] on Wednesday, October 22.   Electronically Signed   By: Evangeline Dakin M.D.   On: 10/12/2013 17:08   [place] Lt Breast Bx W Loc Dev 1st Lesion Img Bx Spec US Guide  10/11/2013   CLINICAL DATA:  Patient presents for ultrasound-guided core needle biopsy of a 9 mm suspicious mass at the 11:30  position of the left breast 8 cm from the nipple.  EXAM: RADIOLOGY EXAMINATION  COMPARISON:  Previous exams.  PROCEDURE: I met with the patient and we discussed the procedure of ultrasound-guided biopsy, including benefits and alternatives. We discussed the high likelihood of a successful procedure. We discussed the risks of the procedure including infection, bleeding, tissue injury, clip migration, and inadequate sampling. Informed written consent was given.  Using sterile technique and 2% Lidocaine as local anesthetic, under direct ultrasound visualization, a 12 gauge vacuum-assisteddevice was used to perform biopsy of the targeted mass at the 11:30 position using a lateral to medial approach. At the conclusion of the procedure, a top hat shaped tissue marker clip was deployed into the biopsy cavity. Follow-up 2-view mammogram was performed and dictated separately.  The usual time-out protocol was performed immediately prior to the procedure.  IMPRESSION: Ultrasound-guided biopsy of a suspicious left breast mass. No apparent complications.   Electronically Signed   By: Marin Olp M.D.   On: 10/11/2013 16:41     LABS:    Chemistry      Component Value Date/Time   NA 139 01/25/2014 1017   NA 140 04/18/2011 1136   K 4.0 01/25/2014 1017   K 3.8 04/18/2011 1136   CL 103 04/18/2011 1136   CO2 28 01/25/2014 1017   CO2 28 04/18/2011 1136   BUN 8.7 01/25/2014 1017   BUN 13 04/18/2011 1136   CREATININE 0.7 01/25/2014 1017   CREATININE 0.6 04/18/2011 1136      Component Value Date/Time   CALCIUM 9.5 01/25/2014 1017   CALCIUM 9.3 04/18/2011 1136   ALKPHOS 65 01/25/2014 1017   AST 12 01/25/2014 1017   ALT 17 01/25/2014 1017   BILITOT 0.88 01/25/2014 1017      Lab Results  Component Value Date   WBC 4.1 01/25/2014   HGB 11.4* 01/25/2014   HCT 35.3 01/25/2014   MCV 84.0 01/25/2014   PLT 262 01/25/2014   PATHOLOGY: ADDITIONAL INFORMATION: 1. A sample (block 1B) was sent to [facility] for Oncotype  testing. The patient's  recurrence score is 29. Those patients who had a recurrence score of 29 had an average rate of distant recurrence of 17%. Enid Cutter MD Pathologist, Electronic Signature ( Signed 11/22/2013) 1. CHROMOGENIC IN-SITU HYBRIDIZATION Results: HER-2/NEU BY CISH - NO AMPLIFICATION OF HER-2 DETECTED. RESULT RATIO OF HER2: CEP 17 SIGNALS 0.95 AVERAGE HER2 COPY NUMBER PER CELL 1.95 REFERENCE RANGE NEGATIVE HER2/Chr17 Ratio <2.0 and Average HER2 copy number <4.0 EQUIVOCAL HER2/Chr17 Ratio <2.0 and Average HER2 copy number 4.0 and <6.0 POSITIVE HER2/Chr17 Ratio >=2.0 and/or Average HER2 copy number >=6.0 Enid Cutter MD Pathologist, Electronic Signature ( Signed 11/04/2013) FINAL DIAGNOSIS Diagnosis 1. Breast, lumpectomy, Left - INVASIVE DUCTAL CARCINOMA, SEE COMMENT. 1 of 4 FINAL for Veronica Rogers, Veronica Rogers [id number]) Diagnosis(continued) - LYMPHOVASCULAR INVASION IDENTIFIED. - INVASIVE TUMOR IS 1 MM FROM NEAREST MARGIN (MEDIAL). - PREVIOUS BIOPSY SITE IDENTIFIED. - SEE TUMOR TEMPLATE BELOW. 2. Breast, excision, left - BENIGN BREAST TISSUE, SEE COMMENT. - NEGATIVE FOR ATYPIA OR MALIGNANCY. - SURGICAL MARGINS, NEGATIVE FOR ATYPIA OR MALIGNANCY. 3. Lymph node, sentinel, biopsy, #1 left Axilla - ONE LYMPH NODE, POSITIVE FOR METASTATIC MAMMARY CARCINOMA (1/1). - TUMOR DEPOSIT IS 1 MM. - NO EXTRACAPSULAR TUMOR EXTENSION IDENTIFIED. 4. Lymph nodes, regional resection, left Axilla - THREE LYMPH NODES, NEGATIVE FOR TUMOR (0/3). 5. Lymph node, sentinel, biopsy, #2 left - ONE LYMPH NODE, NEGATIVE FOR TUMOR (0/1). 6. Lymph node, sentinel, biopsy, #3 left - ONE LYMPH NODE, NEGATIVE FOR TUMOR (0/1). 7. Lymph node, sentinel, biopsy, #4 left - ONE LYMPH NODE, NEGATIVE FOR TUMOR (0/1). Microscopic Comment 1. BREAST, INVASIVE TUMOR, WITH LYMPH NODE SAMPLING Specimen, including laterality and lymph node sampling (sentinel, non-sentinel): Left breast plus sentinel lymph  nodes Procedure: Lumpectomy Histologic type: Ductal Grade: II of III Tubule formation: II Nuclear pleomorphism: II Mitotic:II Tumor size (gross measurement): 0.9 cm Margins: Invasive, distance to closest margin: 1 mm In-situ, distance to closest margin: N/A If margin positive, focally or broadly: N/A Lymphovascular invasion: Present Ductal carcinoma in situ: Absent Grade: N/A Extensive intraductal component: N/A Lobular neoplasia: Absent Tumor focality: Unifocal Treatment effect: None If present, treatment effect in breast tissue, lymph nodes or both: N/A Extent of tumor: Skin: N/A Nipple: N/A Skeletal muscle: N/A Lymph nodes: Examined: 4 Sentinel 3 Non-sentinel 7 Total 2 of 4 FINAL for Veronica Rogers, Veronica Rogers [phone]) Microscopic Comment(continued) Lymph nodes with metastasis: 1 Isolated tumor cells (< 0.2 mm): N/A Micrometastasis: (> 0.2 mm and < 2.0 mm): 1 = 1 mm Macrometastasis: (> 2.0 mm): N/A Extracapsular extension: Absent Breast prognostic profile: Estrogen receptor: Not repeated, previous study demonstrated 100% positivity ([id number]) Progesterone receptor: Not repeated, previous study demonstrated 100% positivity ([id number]) Her 2 neu: Repeated, previous study demonstrated no amplification (1.85) ([id number]) Ki-67: Not repeated, previous study demonstrated 46% proliferative rate ([id number]) Non-neoplastic breast: Previous biopsy site TNM: pT1b, pN1, pMX Comments: None 2. There is no mass grossly identified. Representative section, including margins, demonstrate a nonneoplastic to include fibrocystic change, pseudoangiomatous stromal hyperplasia, and usual ductal hyperplasia. There are no atypical or malignant epithelial or stromal features present. Veronica Rogers Pathologist, Electronic Signature (Case signed 11/02/2013) Intraoperative Diagnosis RAPID INTRAOPERAS  ASSESSMENT/PLAN:    53 year old female with  #1 a screen detected left breast mass  measuring 9 mm by ultrasound at the 11:30 o'clock position 8 cm from the nipple. She did not get an MRI. 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   #  2 final pathology revealed grade 2, 0.9 cm invasive ductal carcinoma one sentinel node was positive for metastatic disease. Tumor ER positive PR positive HER-2/neu negative with an elevated Ki-67.  #3 Oncotype DX testing recurrence score 29 giving her a 17% risk of distant recurrence at 5 years with tamoxifen alone. I have recommended adjuvant curative intent chemotherapy consisting of Taxotere Cytoxan for a total of 4 cycles. Risks benefits and side effects were discussed with the patient.  #4 patient is now status post cycle 3 of adjuvant curative intent Taxotere Cytoxan administered on January 20,2015.   #5 Bilateral lower extremity swelling and pain rule out DVT versus secondary to medication side effects: We'll obtain bilateral lower extremity venous Doppler to rule out DVT. I've asked the patient to elevate the feet for about 10 minutes every 1-2 hours.  #6 plantar and palmar erythema  with pain most likely secondary to Taxotere related: I have prescribed pyridoxine 50 mg by mouth once daily. I have asked the patient to apply calamine lotion in the morning and Aquaphor or Vaseline skin cream  in the evening and to wear cotton gloves and socks after application  #7 Dry skin and dermatitis secondary to chemotherapy related: I have asked the patient to apply calamine lotion in the morning and Aquaphor or Vaseline skin cream  in the evening and to wear cotton gloves and socks after application  #8 nausea: Controlled with medications   Return visit  as scheduled on 02/08/2014 for cycle #4 of TC.  All questions were answered. The patient knows to call the clinic with any problems, questions or concerns. We can  certainly see the patient much sooner if necessary.  I spent 20 minutes counseling the patient face to face. The total time spent in the appointment was 30 minutes.  Wilmon Arms, MD Medical/Oncology [facility]  01/28/2014, 3:17 PM

## 2014-01-28 NOTE — Progress Notes (Signed)
*  PRELIMINARY RESULTS* Vascular Ultrasound Lower extremity venous duplex has been completed.  Preliminary findings: no evidence of DVT.  Called [phone]. Answering service will page Dr. Marin Olp to call me back for report.    Landry Mellow, RDMS, RVT  01/28/2014, 5:05 PM

## 2014-01-28 NOTE — Telephone Encounter (Signed)
, °

## 2014-01-28 NOTE — Telephone Encounter (Signed)
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  She reports no blisters but redness on outer side of left foot close to heel & also noticed some redness on left knuckle. When asked if she has some neuropathy she was hesitant & reports the swelling makes her feet feel a little tingly.  Informed that she probably shouldn't put her feet on the heater. She is at work & can come by after she gets off work @ 4pm.  Discussed with [place] PA & she suggested pt needs to be evaluated.  Talked with Dr. Earnest Conroy & she can see pt @ 2pm.  Returned call to pt & she states that she can be here at 2pm.  POF to scheduler.

## 2014-02-03 ENCOUNTER — Ambulatory Visit: Payer: Self-pay | Admitting: Radiation Oncology

## 2014-02-03 ENCOUNTER — Telehealth: Payer: Self-pay | Admitting: *Deleted

## 2014-02-03 NOTE — Telephone Encounter (Signed)
Returned pt's call about inquiry for the  FMLA due to her feet swelling. No answer but left a detailed message for her to bring [place] the forms from HR when she comes for appt on 02/08/14. I told her to bring a description of her job duties and what she may be restricted from performing. I told her to call me back at [phone] if she needs clarification from my message left. Message to be forwarded to Dr. Earnest Conroy.

## 2014-02-03 NOTE — Telephone Encounter (Signed)
Pt called regarding FMLA letter. Message left for pt to return phone call.

## 2014-02-07 ENCOUNTER — Encounter: Payer: Self-pay | Admitting: Oncology

## 2014-02-07 ENCOUNTER — Other Ambulatory Visit: Payer: Self-pay | Admitting: Oncology

## 2014-02-07 NOTE — Telephone Encounter (Signed)
VERBAL ORDER AND READ BACK TO DR.KHAN- THE CHANGE IN YOUR TASTE BUDS ARE A SIDE EFFECT OF THE CHEMOTHERAPY NO MATTER WHICH ROUTE THE MEDICATION IS GIVEN. NOTIFIED PT.

## 2014-02-08 ENCOUNTER — Other Ambulatory Visit (HOSPITAL_BASED_OUTPATIENT_CLINIC_OR_DEPARTMENT_OTHER): Payer: 59

## 2014-02-08 ENCOUNTER — Ambulatory Visit (HOSPITAL_BASED_OUTPATIENT_CLINIC_OR_DEPARTMENT_OTHER): Payer: 59 | Admitting: Oncology

## 2014-02-08 ENCOUNTER — Ambulatory Visit: Payer: 59 | Admitting: Lab

## 2014-02-08 ENCOUNTER — Ambulatory Visit (HOSPITAL_BASED_OUTPATIENT_CLINIC_OR_DEPARTMENT_OTHER): Payer: 59

## 2014-02-08 ENCOUNTER — Other Ambulatory Visit: Payer: 59

## 2014-02-08 VITALS — BP 144/75 | HR 101 | Temp 97.5°F | Resp 20 | Ht 68.0 in | Wt 197.5 lb

## 2014-02-08 DIAGNOSIS — Z5111 Encounter for antineoplastic chemotherapy: Secondary | ICD-10-CM

## 2014-02-08 DIAGNOSIS — Z170 Estrogen receptor positive status [ER+]: Secondary | ICD-10-CM

## 2014-02-08 DIAGNOSIS — C50212 Malignant neoplasm of upper-inner quadrant of left female breast: Secondary | ICD-10-CM

## 2014-02-08 DIAGNOSIS — C50219 Malignant neoplasm of upper-inner quadrant of unspecified female breast: Secondary | ICD-10-CM

## 2014-02-08 DIAGNOSIS — R110 Nausea: Secondary | ICD-10-CM

## 2014-02-08 DIAGNOSIS — L270 Generalized skin eruption due to drugs and medicaments taken internally: Secondary | ICD-10-CM

## 2014-02-08 DIAGNOSIS — E860 Dehydration: Secondary | ICD-10-CM

## 2014-02-08 LAB — COMPREHENSIVE METABOLIC PANEL (CC13)
ALK PHOS: 59 U/L (ref 40–150)
ALT: 15 U/L (ref 0–55)
AST: 14 U/L (ref 5–34)
Albumin: 3.7 g/dL (ref 3.5–5.0)
Anion Gap: 13 mEq/L — ABNORMAL HIGH (ref 3–11)
BILIRUBIN TOTAL: 0.59 mg/dL (ref 0.20–1.20)
BUN: 15.2 mg/dL (ref 7.0–26.0)
CO2: 22 mEq/L (ref 22–29)
Calcium: 9.8 mg/dL (ref 8.4–10.4)
Chloride: 101 mEq/L (ref 98–109)
Creatinine: 0.8 mg/dL (ref 0.6–1.1)
Glucose: 388 mg/dl — ABNORMAL HIGH (ref 70–140)
Potassium: 4 mEq/L (ref 3.5–5.1)
SODIUM: 136 meq/L (ref 136–145)
TOTAL PROTEIN: 6.8 g/dL (ref 6.4–8.3)

## 2014-02-08 LAB — CBC WITH DIFFERENTIAL/PLATELET
BASO%: 0.1 % (ref 0.0–2.0)
Basophils Absolute: 0 10*3/uL (ref 0.0–0.1)
EOS%: 0 % (ref 0.0–7.0)
Eosinophils Absolute: 0 10*3/uL (ref 0.0–0.5)
HCT: 30.6 % — ABNORMAL LOW (ref 34.8–46.6)
HGB: 9.9 g/dL — ABNORMAL LOW (ref 11.6–15.9)
LYMPH#: 2 10*3/uL (ref 0.9–3.3)
LYMPH%: 13.8 % — ABNORMAL LOW (ref 14.0–49.7)
MCH: 27.3 pg (ref 25.1–34.0)
MCHC: 32.4 g/dL (ref 31.5–36.0)
MCV: 84.5 fL (ref 79.5–101.0)
MONO#: 0.5 10*3/uL (ref 0.1–0.9)
MONO%: 3.6 % (ref 0.0–14.0)
NEUT%: 82.5 % — ABNORMAL HIGH (ref 38.4–76.8)
NEUTROS ABS: 11.8 10*3/uL — AB (ref 1.5–6.5)
Platelets: 331 10*3/uL (ref 145–400)
RBC: 3.62 10*6/uL — AB (ref 3.70–5.45)
RDW: 17 % — ABNORMAL HIGH (ref 11.2–14.5)
WBC: 14.4 10*3/uL — ABNORMAL HIGH (ref 3.9–10.3)
nRBC: 0 % (ref 0–0)

## 2014-02-08 MED ORDER — DOCETAXEL CHEMO INJECTION 160 MG/16ML
75.0000 mg/m2 | Freq: Once | INTRAVENOUS | Status: AC
  Administered 2014-02-08: 160 mg via INTRAVENOUS
  Filled 2014-02-08: qty 16

## 2014-02-08 MED ORDER — DEXAMETHASONE SODIUM PHOSPHATE 20 MG/5ML IJ SOLN
INTRAMUSCULAR | Status: AC
  Filled 2014-02-08: qty 5

## 2014-02-08 MED ORDER — SODIUM CHLORIDE 0.9 % IJ SOLN
10.0000 mL | INTRAMUSCULAR | Status: DC | PRN
  Administered 2014-02-08: 10 mL
  Filled 2014-02-08: qty 10

## 2014-02-08 MED ORDER — DEXAMETHASONE SODIUM PHOSPHATE 20 MG/5ML IJ SOLN
20.0000 mg | Freq: Once | INTRAMUSCULAR | Status: AC
  Administered 2014-02-08: 20 mg via INTRAVENOUS

## 2014-02-08 MED ORDER — HEPARIN SOD (PORK) LOCK FLUSH 100 UNIT/ML IV SOLN
500.0000 [IU] | Freq: Once | INTRAVENOUS | Status: AC | PRN
  Administered 2014-02-08: 500 [IU]
  Filled 2014-02-08: qty 5

## 2014-02-08 MED ORDER — ONDANSETRON 16 MG/50ML IVPB (CHCC)
INTRAVENOUS | Status: AC
  Filled 2014-02-08: qty 16

## 2014-02-08 MED ORDER — SODIUM CHLORIDE 0.9 % IV SOLN
600.0000 mg/m2 | Freq: Once | INTRAVENOUS | Status: AC
  Administered 2014-02-08: 1240 mg via INTRAVENOUS
  Filled 2014-02-08: qty 62

## 2014-02-08 MED ORDER — ONDANSETRON 16 MG/50ML IVPB (CHCC)
16.0000 mg | Freq: Once | INTRAVENOUS | Status: AC
  Administered 2014-02-08: 16 mg via INTRAVENOUS

## 2014-02-08 MED ORDER — SODIUM CHLORIDE 0.9 % IV SOLN
Freq: Once | INTRAVENOUS | Status: AC
Start: 2014-02-08 — End: 2014-02-08
  Administered 2014-02-08: 13:00:00 via INTRAVENOUS

## 2014-02-08 NOTE — Patient Instructions (Signed)
[facility] Discharge Instructions for Patients Receiving Chemotherapy  Today you received the following chemotherapy agents:  Taxotere and Cytoxan  To help prevent nausea and vomiting after your treatment, we encourage you to take your nausea medication as ordered per MD.   If you develop nausea and vomiting that is not controlled by your nausea medication, call the clinic.   BELOW ARE SYMPTOMS THAT SHOULD BE REPORTED IMMEDIATELY:  *FEVER GREATER THAN 100.5 F  *CHILLS WITH OR WITHOUT FEVER  NAUSEA AND VOMITING THAT IS NOT CONTROLLED WITH YOUR NAUSEA MEDICATION  *UNUSUAL SHORTNESS OF BREATH  *UNUSUAL BRUISING OR BLEEDING  TENDERNESS IN MOUTH AND THROAT WITH OR WITHOUT PRESENCE OF ULCERS  *URINARY PROBLEMS  *BOWEL PROBLEMS  UNUSUAL RASH Items with * indicate a potential emergency and should be followed up as soon as possible.  Feel free to call the clinic you have any questions or concerns. The clinic phone number is [phone].    

## 2014-02-09 ENCOUNTER — Encounter: Payer: Self-pay | Admitting: Oncology

## 2014-02-09 ENCOUNTER — Ambulatory Visit (HOSPITAL_BASED_OUTPATIENT_CLINIC_OR_DEPARTMENT_OTHER): Payer: 59

## 2014-02-09 VITALS — BP 159/90 | HR 92 | Temp 97.6°F

## 2014-02-09 DIAGNOSIS — Z5189 Encounter for other specified aftercare: Secondary | ICD-10-CM

## 2014-02-09 DIAGNOSIS — C50219 Malignant neoplasm of upper-inner quadrant of unspecified female breast: Secondary | ICD-10-CM

## 2014-02-09 DIAGNOSIS — C50212 Malignant neoplasm of upper-inner quadrant of left female breast: Secondary | ICD-10-CM

## 2014-02-09 MED ORDER — PEGFILGRASTIM INJECTION 6 MG/0.6ML
6.0000 mg | Freq: Once | SUBCUTANEOUS | Status: AC
  Administered 2014-02-09: 6 mg via SUBCUTANEOUS
  Filled 2014-02-09: qty 0.6

## 2014-02-09 NOTE — Progress Notes (Signed)
Veronica Rogers [id number] [birth year] 53 y.o. 02/09/2014 4:24 AM  CC Veronica Lamer, MD [address] [place] [address]  DIAGNOSIS:  53 year old female with left breast cancer stage IA.     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 Rolm Bookbinder  PRIOR ONCOLOGIC HISTORY:  Veronica Rogers is a 53 y.o. female.  with  #1Patient had a screening mammogram performed that revealed a left breast mass measuring 9 mm. By ultrasound it was 9 mm at the 11:30 o'clock position 8 cm from the nipple.  She did not have an MRI performed. 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 Patient's case was discussed at the multidisciplinary breast conference. She is now seen in the multidisciplinary breast clinic for discussion of treatment options. She was seen by Dr. Rolm Bookbinder as well as Dr. Gery Pray.  #2 Patient is now status post lumpectomy performed on 10/29/2013. Her final pathology did reveal grade 2, 0.9 cm invasive ductal carcinoma that was ER positive PR positive. One sentinel node was positive for micrometastasis.  #3 Patient also had an Oncotype DX testing done and her recurrence score was 29 giving her a 17% risk of distant recurrence.  #4 S/P curative intentTaxotere Cytoxan  12/07/2013- 2/10/15x  4 cycles   CURRENT THERAPY: cycle 4 adjuvant chemotherapy with Taxotere and Cytoxan    INTERVAL HISTORY: patient is here for cycle #4 of curative intent adjuvant chemotherapy consisting of Taxotere and Cytoxan today.  She is feeling much better. She was seen last week unfortunately for bilateral lower extremity swelling. This is down  significantly resolved. She most likely had this secondary to the Taxotere chemotherapy. She feels much better. She is looking forward to the final cycle of treatment. She is a little bit weak and tired but otherwise has not had any headaches double vision blurring of vision fevers chills night sweats no shortness of breath no chest pains or palpitations no peripheral paresthesias. No bleeding. Remainder of the 10 point review of systems is negative.   Past Medical History: Past Medical History  Diagnosis Date  . Breast cancer   . Anxiety   . Depression   . Contact lens/glasses fitting     wears contacts or glasses    Past Surgical History: Past Surgical History  Procedure Laterality Date  . Breast biopsy  1996    right breast, benign  . Wisdom tooth extraction    . Colonoscopy    . Breast lumpectomy with needle localization and axillary sentinel lymph node bx Left 10/29/2013    Procedure: BREAST LUMPECTOMY WITH NEEDLE LOCALIZATION AND AXILLARY SENTINEL LYMPH NODE BX;  Surgeon: Rolm Bookbinder, MD;  Location: [facility];  Service: General;  Laterality: Left;    Family History: Family History  Problem Relation Age of Onset  . Squamous cell carcinoma Father   . Cancer Father   . Diabetes Father   . Hypertension Mother   . Diabetes Sister     Social History History  Substance Use Topics  . Smoking status: Never Smoker   . Smokeless tobacco: Never Used  . Alcohol Use: Yes  Comment: 1-2 drinks a month    Allergies: Allergies  Allergen Reactions  . Erythromycin Other (See Comments)    Stomach pain  . Tape Rash    Current Medications: Current Outpatient Prescriptions  Medication Sig Dispense Refill  . calamine lotion Apply 1 application topically 2 (two) times daily. Apply twice  Daily to the affected area for one week  120 mL  0  . dexamethasone (DECADRON) 4 MG tablet TAKE 2 TABLETS TWICE A DAY WITH FOOD DAY BEFORE CHEMO THEN 2 TABS TWICE A DAY DAY  AFTER X3 DAYS  30 tablet  1  . Disposable Gloves (COTTON GLOVES MEDIUM) MISC 2 each by Does not apply route 2 (two) times daily.  4 each  0  . glucose blood (TRUETEST TEST) test strip 1 each by Other route every morning. 1 Box  50 each  3  . Insulin Glargine (LANTUS) 100 UNIT/ML Solostar Pen Inject 18 Units into the skin at bedtime.  2 pen  11  . Insulin Pen Needle (CLICKFINE PEN NEEDLES) 32G X 4 MM MISC Inject 1 each into the skin daily.  50 each  1  . lidocaine-prilocaine (EMLA) cream Apply 1 application topically as needed.  30 g  1  . LORazepam (ATIVAN) 0.5 MG tablet Take 1 tablet (0.5 mg) every 6 hours as needed for nausea or vomiting.  30 tablet  0  . ondansetron (ZOFRAN) 8 MG tablet Take two times a day starting the day after chemotherapy for 3 days. Then take two times a day as needed for nausea or vomiting.  30 tablet  1  . prochlorperazine (COMPAZINE) 10 MG tablet Take 1 tablet (10 mg total) by mouth every 6 (six) hours as needed for nausea or vomiting.  30 tablet  1  . pyridOXINE (VITAMIN B-6) 50 MG tablet Take 1 tablet (50 mg total) by mouth daily.  30 tablet  0  . sertraline (ZOLOFT) 50 MG tablet Take 25 mg by mouth daily.       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  1 Units  0   No current facility-administered medications for this visit.   REVIEW OF SYSTEMS: A 10 point review of systems was conducted and is otherwise negative except for what is noted above.    PHYSICAL EXAMINATION: Blood pressure 144/75, pulse 101, temperature 97.5 F (36.4 C), temperature source Oral, resp. rate 20, height 5' 8" (1.727 m), weight 197 lb 8 oz (89.585 kg). GENERAL: Patient is a well appearing female in no acute distress HEENT:  Sclerae anicteric.  Oropharynx clear and moist. No ulcerations or evidence of oropharyngeal candidiasis. Neck is supple.  NODES:  No cervical, supraclavicular, or axillary lymphadenopathy palpated.  BREAST EXAM:  Deferred. LUNGS:  Clear  to auscultation bilaterally.  No wheezes or rhonchi. HEART:  Regular rate and rhythm. No murmur appreciated. ABDOMEN:  Soft, nontender.  Positive, normoactive bowel sounds. No organomegaly palpated. MSK:  No focal spinal tenderness to palpation. Full range of motion bilaterally in the upper extremities. EXTREMITIES:  Bilateral lower extremity swelling right more than the left.  SKIN:  Bilateral pedal erythema, dry skin and some  exfoliation of the skin was noted on the feet. Slight palmar erythema dry skin and fissures in both the thumbs were noted  NEURO:  Nonfocal. Well oriented.  Appropriate affect. ECOG PERFORMANCE STATUS: 0 - Asymptomatic   STUDIES/RESULTS: US Breast Left  27-Oct-2013   CLINICAL DATA:  Patient presents for additional views of  the left breast as followup to a recent screening exam suggesting a possible mass.  EXAM: DIGITAL DIAGNOSTIC UNILATERAL LEFT MAMMOGRAM LIMITED; ULTRASOUND LEFT BREAST  COMPARISON:  09/01/2012, 09/27/2011, 07/19/2010, 05/26/2009, 04/01/2008 and 02/05/2007  ACR Breast Density Category b: There are scattered areas of fibroglandular density.  FINDINGS: Spot compression views demonstrate a spiculated 1 cm mass over the slightly inner upper left breast.  Ultrasound is performed, showing an irregular bordered hypoechoic solid mass with minimal shadowing at the 11:30 position of the left breast 8 cm from the nipple measuring 8 x 9 x 9 mm. Ultrasound of the left axilla demonstrates no abnormal appearing lymph nodes.  IMPRESSION: Suspicious 8 x 9 x 9 mm irregular bordered hypoechoic mass at the 11:30 position of the left breast 8 cm from the nipple.  RECOMMENDATION: Recommend ultrasound-guided core needle biopsy of the suspicious mass.  I have discussed the findings and recommendations with the patient. Results were also provided in writing at the conclusion of the visit. If applicable, a reminder letter will be sent to the patient regarding the next appointment.   BI-RADS CATEGORY  5: Highly suggestive of malignancy - appropriate action should be taken.  Biopsy will be done today.   Electronically Signed   By: Marin Olp M.D.   On: 10/11/2013 16:33   Adams L  10/11/2013   CLINICAL DATA:  Patient presents for additional views of the left breast as followup to a recent screening exam suggesting a possible mass.  EXAM: DIGITAL DIAGNOSTIC UNILATERAL LEFT MAMMOGRAM LIMITED; ULTRASOUND LEFT BREAST  COMPARISON:  09/01/2012, 09/27/2011, 07/19/2010, 05/26/2009, 04/01/2008 and 02/05/2007  ACR Breast Density Category b: There are scattered areas of fibroglandular density.  FINDINGS: Spot compression views demonstrate a spiculated 1 cm mass over the slightly inner upper left breast.  Ultrasound is performed, showing an irregular bordered hypoechoic solid mass with minimal shadowing at the 11:30 position of the left breast 8 cm from the nipple measuring 8 x 9 x 9 mm. Ultrasound of the left axilla demonstrates no abnormal appearing lymph nodes.  IMPRESSION: Suspicious 8 x 9 x 9 mm irregular bordered hypoechoic mass at the 11:30 position of the left breast 8 cm from the nipple.  RECOMMENDATION: Recommend ultrasound-guided core needle biopsy of the suspicious mass.  I have discussed the findings and recommendations with the patient. Results were also provided in writing at the conclusion of the visit. If applicable, a reminder letter will be sent to the patient regarding the next appointment.  BI-RADS CATEGORY  5: Highly suggestive of malignancy - appropriate action should be taken.  Biopsy will be done today.   Electronically Signed   By: Marin Olp M.D.   On: 10/11/2013 16:33   Mm Digital Diagnostic Unilat L  10/11/2013   CLINICAL DATA:  The patient is post ultrasound-guided core biopsy of a 9 mm spiculated mass over the 11:30 position of the left breast.  EXAM: DIGITAL DIAGNOSTIC UNILATERAL LEFT MAMMOGRAM  COMPARISON:  Previous exams  FINDINGS: Mammographic  images were obtained following ultrasound guided biopsy of an irregular 9 mm mass of the 11:30 position of the left breast. Exam demonstrates satisfactory placement of a cylindrical shaped metallic clip over the biopsied mass in the upper inner left breast.  IMPRESSION: Satisfactory clip placement post ultrasound core biopsy left breast.  Final Assessment: Post Procedure Mammograms for Marker Placement   Electronically Signed   By: Marin Olp M.D.   On: 10/11/2013 17:04   Mm Radiologist Eval  And Mgmt  10/12/2013   EXAM: ESTABLISHED PATIENT OFFICE VISIT -LEVEL II ([id number])  HISTORY OF PRESENT ILLNESS: Screening detected left breast mass. Ultrasound-guided core needle biopsy was performed yesterday. Pathology returned as grade 2-3 invasive ductal carcinoma.  Patient denies significant pain or bleeding at the biopsy site.  CHIEF COMPLAINT: Patient returns 1 day post ultrasound-guided core needle biopsy of a screening detected 0.9 cm mass in the upper left breast.  PHYSICAL EXAMINATION: Biopsy site in the upper left breast is clean without evidence of bleeding from the incision. The breast is soft on palpation and there is no evidence of hematoma.  ASSESSMENT AND PLAN: 0.9 cm mass in the upper left breast, pathology from ultrasound-guided core needle biopsy revealing grade 2-3 invasive ductal carcinoma, concordant with imaging findings.  Patient is scheduled for the Multidisciplinary Breast Cancer Clinic at the [facility] on Wednesday, October 22.   Electronically Signed   By: Evangeline Dakin M.D.   On: 10/12/2013 17:08   [place] Lt Breast Bx W Loc Dev 1st Lesion Img Bx Spec US Guide  10/11/2013   CLINICAL DATA:  Patient presents for ultrasound-guided core needle biopsy of a 9 mm suspicious mass at the 11:30 position of the left breast 8 cm from the nipple.  EXAM: RADIOLOGY EXAMINATION  COMPARISON:  Previous exams.  PROCEDURE: I met with the patient and we discussed the  procedure of ultrasound-guided biopsy, including benefits and alternatives. We discussed the high likelihood of a successful procedure. We discussed the risks of the procedure including infection, bleeding, tissue injury, clip migration, and inadequate sampling. Informed written consent was given.  Using sterile technique and 2% Lidocaine as local anesthetic, under direct ultrasound visualization, a 12 gauge vacuum-assisteddevice was used to perform biopsy of the targeted mass at the 11:30 position using a lateral to medial approach. At the conclusion of the procedure, a top hat shaped tissue marker clip was deployed into the biopsy cavity. Follow-up 2-view mammogram was performed and dictated separately.  The usual time-out protocol was performed immediately prior to the procedure.  IMPRESSION: Ultrasound-guided biopsy of a suspicious left breast mass. No apparent complications.   Electronically Signed   By: Marin Olp M.D.   On: 10/11/2013 16:41     LABS:    Chemistry      Component Value Date/Time   NA 136 02/08/2014 1137   NA 140 04/18/2011 1136   K 4.0 02/08/2014 1137   K 3.8 04/18/2011 1136   CL 103 04/18/2011 1136   CO2 22 02/08/2014 1137   CO2 28 04/18/2011 1136   BUN 15.2 02/08/2014 1137   BUN 13 04/18/2011 1136   CREATININE 0.8 02/08/2014 1137   CREATININE 0.6 04/18/2011 1136      Component Value Date/Time   CALCIUM 9.8 02/08/2014 1137   CALCIUM 9.3 04/18/2011 1136   ALKPHOS 59 02/08/2014 1137   AST 14 02/08/2014 1137   ALT 15 02/08/2014 1137   BILITOT 0.59 02/08/2014 1137      Lab Results  Component Value Date   WBC 14.4* 02/08/2014   HGB 9.9* 02/08/2014   HCT 30.6* 02/08/2014   MCV 84.5 02/08/2014   PLT 331 02/08/2014   PATHOLOGY: ADDITIONAL INFORMATION: 1. A sample (block 1B) was sent to [facility] for Oncotype testing. The patient's recurrence score is 29. Those patients who had a recurrence score of 29 had an average rate of distant recurrence of 17%. Enid Cutter  MD Pathologist, Electronic Signature (  Signed 11/22/2013) 1. CHROMOGENIC IN-SITU HYBRIDIZATION Results: HER-2/NEU BY CISH - NO AMPLIFICATION OF HER-2 DETECTED. RESULT RATIO OF HER2: CEP 17 SIGNALS 0.95 AVERAGE HER2 COPY NUMBER PER CELL 1.95 REFERENCE RANGE NEGATIVE HER2/Chr17 Ratio <2.0 and Average HER2 copy number <4.0 EQUIVOCAL HER2/Chr17 Ratio <2.0 and Average HER2 copy number 4.0 and <6.0 POSITIVE HER2/Chr17 Ratio >=2.0 and/or Average HER2 copy number >=6.0 Enid Cutter MD Pathologist, Electronic Signature ( Signed 11/04/2013) FINAL DIAGNOSIS Diagnosis 1. Breast, lumpectomy, Left - INVASIVE DUCTAL CARCINOMA, SEE COMMENT. 1 of 4 FINAL for Veronica, Rogers [id number]) Diagnosis(continued) - LYMPHOVASCULAR INVASION IDENTIFIED. - INVASIVE TUMOR IS 1 MM FROM NEAREST MARGIN (MEDIAL). - PREVIOUS BIOPSY SITE IDENTIFIED. - SEE TUMOR TEMPLATE BELOW. 2. Breast, excision, left - BENIGN BREAST TISSUE, SEE COMMENT. - NEGATIVE FOR ATYPIA OR MALIGNANCY. - SURGICAL MARGINS, NEGATIVE FOR ATYPIA OR MALIGNANCY. 3. Lymph node, sentinel, biopsy, #1 left Axilla - ONE LYMPH NODE, POSITIVE FOR METASTATIC MAMMARY CARCINOMA (1/1). - TUMOR DEPOSIT IS 1 MM. - NO EXTRACAPSULAR TUMOR EXTENSION IDENTIFIED. 4. Lymph nodes, regional resection, left Axilla - THREE LYMPH NODES, NEGATIVE FOR TUMOR (0/3). 5. Lymph node, sentinel, biopsy, #2 left - ONE LYMPH NODE, NEGATIVE FOR TUMOR (0/1). 6. Lymph node, sentinel, biopsy, #3 left - ONE LYMPH NODE, NEGATIVE FOR TUMOR (0/1). 7. Lymph node, sentinel, biopsy, #4 left - ONE LYMPH NODE, NEGATIVE FOR TUMOR (0/1). Microscopic Comment 1. BREAST, INVASIVE TUMOR, WITH LYMPH NODE SAMPLING Specimen, including laterality and lymph node sampling (sentinel, non-sentinel): Left breast plus sentinel lymph nodes Procedure: Lumpectomy Histologic type: Ductal Grade: II of III Tubule formation: II Nuclear pleomorphism: II Mitotic:II Tumor size (gross measurement): 0.9  cm Margins: Invasive, distance to closest margin: 1 mm In-situ, distance to closest margin: N/A If margin positive, focally or broadly: N/A Lymphovascular invasion: Present Ductal carcinoma in situ: Absent Grade: N/A Extensive intraductal component: N/A Lobular neoplasia: Absent Tumor focality: Unifocal Treatment effect: None If present, treatment effect in breast tissue, lymph nodes or both: N/A Extent of tumor: Skin: N/A Nipple: N/A Skeletal muscle: N/A Lymph nodes: Examined: 4 Sentinel 3 Non-sentinel 7 Total 2 of 4 FINAL for Veronica Rogers, Veronica Rogers [phone]) Microscopic Comment(continued) Lymph nodes with metastasis: 1 Isolated tumor cells (< 0.2 mm): N/A Micrometastasis: (> 0.2 mm and < 2.0 mm): 1 = 1 mm Macrometastasis: (> 2.0 mm): N/A Extracapsular extension: Absent Breast prognostic profile: Estrogen receptor: Not repeated, previous study demonstrated 100% positivity ([id number]) Progesterone receptor: Not repeated, previous study demonstrated 100% positivity ([id number]) Her 2 neu: Repeated, previous study demonstrated no amplification (1.85) ([id number]) Ki-67: Not repeated, previous study demonstrated 46% proliferative rate ([id number]) Non-neoplastic breast: Previous biopsy site TNM: pT1b, pN1, pMX Comments: None 2. There is no mass grossly identified. Representative section, including margins, demonstrate a nonneoplastic to include fibrocystic change, pseudoangiomatous stromal hyperplasia, and usual ductal hyperplasia. There are no atypical or malignant epithelial or stromal features present. Mali RUND DO Pathologist, Electronic Signature (Case signed 11/02/2013) Intraoperative Diagnosis RAPID INTRAOPERAS  ASSESSMENT/PLAN:    53 year old female with  #1 a screen detected left breast mass measuring 9 mm by ultrasound at the 11:30 o'clock position 8 cm from the nipple. She did not get an MRI. Needle core biopsy performed on 10/11/2013 revealed a grade 2  invasive ductal carcinoma ER positive PR positive HER-2/neu negative with an elevated proliferation marker Ki-67 of 46%.   #2 final pathology revealed grade 2, 0.9 cm invasive ductal carcinoma one sentinel node was positive for metastatic disease. Tumor ER positive PR positive HER-2/neu negative with an  elevated Ki-67.  #3 Oncotype DX testing recurrence score 29 giving her a 17% risk of distant recurrence at 5 years with tamoxifen alone. I have recommended adjuvant curative intent chemotherapy consisting of Taxotere Cytoxan for a total of 4 cycles. Risks benefits and side effects were discussed with the patient.  #4 Here for  cycle 4 of adjuvant curative intent Taxotere Cytoxan today  #5 Bilateral lower extremity swelling and pain resolved  #6 plantar and palmar erythema: resolved  #7 Dry skin and dermatitis secondary to chemotherapy related: I have asked the patient to apply calamine lotion in the morning and Aquaphor or Vaseline skin cream  in the evening and to wear cotton gloves and socks after application  #8 nausea: Controlled with medications   All questions were answered. The patient knows to call the clinic with any problems, questions or concerns. We can certainly see the patient much sooner if necessary.  I spent 20 minutes counseling the patient face to face. The total time spent in the appointment was 30 minutes.  Marcy Panning, MD Medical/Oncology [facility] [phone] (beeper) [phone] (Office)

## 2014-02-10 ENCOUNTER — Telehealth: Payer: Self-pay | Admitting: *Deleted

## 2014-02-10 NOTE — Telephone Encounter (Signed)
Per staff message and POF I have scheduled appts. First available is 145pm, advised scheduler of time JMW

## 2014-02-15 ENCOUNTER — Encounter: Payer: Self-pay | Admitting: Oncology

## 2014-02-15 ENCOUNTER — Ambulatory Visit (HOSPITAL_BASED_OUTPATIENT_CLINIC_OR_DEPARTMENT_OTHER): Payer: 59

## 2014-02-15 ENCOUNTER — Ambulatory Visit (HOSPITAL_BASED_OUTPATIENT_CLINIC_OR_DEPARTMENT_OTHER): Payer: 59 | Admitting: Oncology

## 2014-02-15 ENCOUNTER — Other Ambulatory Visit (HOSPITAL_BASED_OUTPATIENT_CLINIC_OR_DEPARTMENT_OTHER): Payer: 59

## 2014-02-15 VITALS — BP 129/68 | HR 116 | Temp 98.1°F | Resp 18 | Ht 68.0 in | Wt 195.0 lb

## 2014-02-15 DIAGNOSIS — C50219 Malignant neoplasm of upper-inner quadrant of unspecified female breast: Secondary | ICD-10-CM

## 2014-02-15 DIAGNOSIS — C50212 Malignant neoplasm of upper-inner quadrant of left female breast: Secondary | ICD-10-CM

## 2014-02-15 DIAGNOSIS — Z171 Estrogen receptor negative status [ER-]: Secondary | ICD-10-CM

## 2014-02-15 DIAGNOSIS — R110 Nausea: Secondary | ICD-10-CM

## 2014-02-15 DIAGNOSIS — L259 Unspecified contact dermatitis, unspecified cause: Secondary | ICD-10-CM

## 2014-02-15 DIAGNOSIS — L851 Acquired keratosis [keratoderma] palmaris et plantaris: Secondary | ICD-10-CM

## 2014-02-15 DIAGNOSIS — C773 Secondary and unspecified malignant neoplasm of axilla and upper limb lymph nodes: Secondary | ICD-10-CM

## 2014-02-15 LAB — CBC WITH DIFFERENTIAL/PLATELET
BASO%: 1.2 % (ref 0.0–2.0)
Basophils Absolute: 0 10*3/uL (ref 0.0–0.1)
EOS%: 4.3 % (ref 0.0–7.0)
Eosinophils Absolute: 0.1 10*3/uL (ref 0.0–0.5)
HEMATOCRIT: 31 % — AB (ref 34.8–46.6)
HGB: 9.9 g/dL — ABNORMAL LOW (ref 11.6–15.9)
LYMPH%: 42.3 % (ref 14.0–49.7)
MCH: 27.5 pg (ref 25.1–34.0)
MCHC: 31.9 g/dL (ref 31.5–36.0)
MCV: 86.1 fL (ref 79.5–101.0)
MONO#: 0.2 10*3/uL (ref 0.1–0.9)
MONO%: 7.1 % (ref 0.0–14.0)
NEUT#: 1.5 10*3/uL (ref 1.5–6.5)
NEUT%: 45.1 % (ref 38.4–76.8)
PLATELETS: 247 10*3/uL (ref 145–400)
RBC: 3.6 10*6/uL — ABNORMAL LOW (ref 3.70–5.45)
RDW: 16.7 % — ABNORMAL HIGH (ref 11.2–14.5)
WBC: 3.2 10*3/uL — ABNORMAL LOW (ref 3.9–10.3)
lymph#: 1.4 10*3/uL (ref 0.9–3.3)

## 2014-02-15 LAB — COMPREHENSIVE METABOLIC PANEL (CC13)
ALT: 14 U/L (ref 0–55)
AST: 11 U/L (ref 5–34)
Albumin: 3.2 g/dL — ABNORMAL LOW (ref 3.5–5.0)
Alkaline Phosphatase: 58 U/L (ref 40–150)
Anion Gap: 13 mEq/L — ABNORMAL HIGH (ref 3–11)
BILIRUBIN TOTAL: 0.94 mg/dL (ref 0.20–1.20)
BUN: 10.2 mg/dL (ref 7.0–26.0)
CO2: 26 mEq/L (ref 22–29)
Calcium: 9.4 mg/dL (ref 8.4–10.4)
Chloride: 101 mEq/L (ref 98–109)
Creatinine: 0.8 mg/dL (ref 0.6–1.1)
Glucose: 312 mg/dl — ABNORMAL HIGH (ref 70–140)
Potassium: 3.6 mEq/L (ref 3.5–5.1)
Sodium: 140 mEq/L (ref 136–145)
Total Protein: 5.6 g/dL — ABNORMAL LOW (ref 6.4–8.3)

## 2014-02-15 MED ORDER — ONDANSETRON 8 MG/NS 50 ML IVPB
INTRAVENOUS | Status: AC
  Filled 2014-02-15: qty 8

## 2014-02-15 MED ORDER — ONDANSETRON 8 MG/50ML IVPB (CHCC)
8.0000 mg | Freq: Once | INTRAVENOUS | Status: AC
  Administered 2014-02-15: 8 mg via INTRAVENOUS

## 2014-02-15 MED ORDER — SODIUM CHLORIDE 0.9 % IJ SOLN
10.0000 mL | INTRAMUSCULAR | Status: DC | PRN
  Administered 2014-02-15: 10 mL via INTRAVENOUS
  Filled 2014-02-15: qty 10

## 2014-02-15 MED ORDER — SODIUM CHLORIDE 0.9 % IV SOLN
INTRAVENOUS | Status: DC
  Administered 2014-02-15: 11:00:00 via INTRAVENOUS

## 2014-02-15 MED ORDER — HEPARIN SOD (PORK) LOCK FLUSH 100 UNIT/ML IV SOLN
500.0000 [IU] | Freq: Once | INTRAVENOUS | Status: AC
  Administered 2014-02-15: 500 [IU] via INTRAVENOUS
  Filled 2014-02-15: qty 5

## 2014-02-15 NOTE — Progress Notes (Signed)
Veronica Rogers [id number] 04/30/1961 53 y.o. 02/15/2014 10:46 AM  CC Darene Lamer, MD [address] [place] [address]  DIAGNOSIS:  53 year old female with left breast cancer stage IA.     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 Rolm Bookbinder  PRIOR ONCOLOGIC HISTORY:  Veronica Rogers is a 53 y.o. female.  with  #1Patient had a screening mammogram performed that revealed a left breast mass measuring 9 mm. By ultrasound it was 9 mm at the 11:30 o'clock position 8 cm from the nipple.  She did not have an MRI performed. 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 Patient's case was discussed at the multidisciplinary breast conference. She is now seen in the multidisciplinary breast clinic for discussion of treatment options. She was seen by Dr. Rolm Bookbinder as well as Dr. Gery Pray.  #2 Patient is now status post lumpectomy performed on 10/29/2013. Her final pathology did reveal grade 2, 0.9 cm invasive ductal carcinoma that was ER positive PR positive. One sentinel node was positive for micrometastasis.  #3 Patient also had an Oncotype DX testing done and her recurrence score was 29 giving her a 17% risk of distant recurrence.  #4 S/P curative intentTaxotere Cytoxan  12/07/2013- 2/10/15x  4 cycles   CURRENT THERAPY:  Proceed with RT  INTERVAL HISTORY: patient is here for follow up post  cycle #4 of curative intent adjuvant chemotherapy consisting of Taxotere and Cytoxan today.  She is feeling much better.  . She feels much better. She is looking forward to the final cycle of treatment. She is a little bit weak and tired  but otherwise has not had any headaches double vision blurring of vision fevers chills night sweats no shortness of breath no chest pains or palpitations no peripheral paresthesias. No bleeding. Remainder of the 10 point review of systems is negative.   Past Medical History: Past Medical History  Diagnosis Date  . Breast cancer   . Anxiety   . Depression   . Contact lens/glasses fitting     wears contacts or glasses    Past Surgical History: Past Surgical History  Procedure Laterality Date  . Breast biopsy  1996    right breast, benign  . Wisdom tooth extraction    . Colonoscopy    . Breast lumpectomy with needle localization and axillary sentinel lymph node bx Left 10/29/2013    Procedure: BREAST LUMPECTOMY WITH NEEDLE LOCALIZATION AND AXILLARY SENTINEL LYMPH NODE BX;  Surgeon: Rolm Bookbinder, MD;  Location: [facility];  Service: General;  Laterality: Left;    Family History: Family History  Problem Relation Age of Onset  . Squamous cell carcinoma Father   . Cancer Father   . Diabetes Father   . Hypertension Mother   . Diabetes Sister     Social History History  Substance Use Topics  . Smoking status: Never Smoker   . Smokeless tobacco: Never Used  . Alcohol Use: Yes     Comment: 1-2 drinks a month    Allergies: Allergies  Allergen Reactions  . Erythromycin Other (See Comments)    Stomach pain  .  Tape Rash    Current Medications: Current Outpatient Prescriptions  Medication Sig Dispense Refill  . calamine lotion Apply 1 application topically 2 (two) times daily. Apply twice  Daily to the affected area for one week  120 mL  0  . dexamethasone (DECADRON) 4 MG tablet TAKE 2 TABLETS TWICE A DAY WITH FOOD DAY BEFORE CHEMO THEN 2 TABS TWICE A DAY DAY AFTER X3 DAYS  30 tablet  1  . Disposable Gloves (COTTON GLOVES MEDIUM) MISC 2 each by Does not apply route 2 (two) times daily.  4 each  0  . glucose blood (TRUETEST TEST) test strip 1 each by Other  route every morning. 1 Box  50 each  3  . Insulin Glargine (LANTUS) 100 UNIT/ML Solostar Pen Inject 18 Units into the skin at bedtime.  2 pen  11  . Insulin Pen Needle (CLICKFINE PEN NEEDLES) 32G X 4 MM MISC Inject 1 each into the skin daily.  50 each  1  . lidocaine-prilocaine (EMLA) cream Apply 1 application topically as needed.  30 g  1  . LORazepam (ATIVAN) 0.5 MG tablet Take 1 tablet (0.5 mg) every 6 hours as needed for nausea or vomiting.  30 tablet  0  . ondansetron (ZOFRAN) 8 MG tablet Take two times a day starting the day after chemotherapy for 3 days. Then take two times a day as needed for nausea or vomiting.  30 tablet  1  . prochlorperazine (COMPAZINE) 10 MG tablet Take 1 tablet (10 mg total) by mouth every 6 (six) hours as needed for nausea or vomiting.  30 tablet  1  . pyridOXINE (VITAMIN B-6) 50 MG tablet Take 1 tablet (50 mg total) by mouth daily.  30 tablet  0  . sertraline (ZOLOFT) 50 MG tablet Take 25 mg by mouth daily.       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  1 Units  0   No current facility-administered medications for this visit.   REVIEW OF SYSTEMS: A 10 point review of systems was conducted and is otherwise negative except for what is noted above.    PHYSICAL EXAMINATION: Blood pressure 129/68, pulse 116, temperature 98.1 F (36.7 C), temperature source Oral, resp. rate 18, height 5' 8" (1.727 m), weight 195 lb (88.451 kg). GENERAL: Patient is a well appearing female in no acute distress HEENT:  Sclerae anicteric.  Oropharynx clear and moist. No ulcerations or evidence of oropharyngeal candidiasis. Neck is supple.  NODES:  No cervical, supraclavicular, or axillary lymphadenopathy palpated.  BREAST EXAM:  Deferred. LUNGS:  Clear to auscultation bilaterally.  No wheezes or rhonchi. HEART:  Regular rate and rhythm. No murmur appreciated. ABDOMEN:  Soft, nontender.  Positive, normoactive bowel sounds. No organomegaly palpated. MSK:   No focal spinal tenderness to palpation. Full range of motion bilaterally in the upper extremities. EXTREMITIES:  Bilateral lower extremity swelling right more than the left.  SKIN:  Bilateral pedal erythema, dry skin and some  exfoliation of the skin was noted on the feet. Slight palmar erythema dry skin and fissures in both the thumbs were noted  NEURO:  Nonfocal. Well oriented.  Appropriate affect. ECOG PERFORMANCE STATUS: 0 - Asymptomatic   STUDIES/RESULTS: US Breast Left  November 06, 2013   CLINICAL DATA:  Patient presents for additional views of the left breast as followup to a recent screening exam suggesting a possible mass.  EXAM: DIGITAL DIAGNOSTIC UNILATERAL LEFT MAMMOGRAM LIMITED; ULTRASOUND LEFT BREAST  COMPARISON:  09/01/2012, 09/27/2011, 07/19/2010, 05/26/2009, 04/01/2008 and 02/05/2007  ACR Breast Density Category b: There are scattered areas of fibroglandular density.  FINDINGS: Spot compression views demonstrate a spiculated 1 cm mass over the slightly inner upper left breast.  Ultrasound is performed, showing an irregular bordered hypoechoic solid mass with minimal shadowing at the 11:30 position of the left breast 8 cm from the nipple measuring 8 x 9 x 9 mm. Ultrasound of the left axilla demonstrates no abnormal appearing lymph nodes.  IMPRESSION: Suspicious 8 x 9 x 9 mm irregular bordered hypoechoic mass at the 11:30 position of the left breast 8 cm from the nipple.  RECOMMENDATION: Recommend ultrasound-guided core needle biopsy of the suspicious mass.  I have discussed the findings and recommendations with the patient. Results were also provided in writing at the conclusion of the visit. If applicable, a reminder letter will be sent to the patient regarding the next appointment.  BI-RADS CATEGORY  5: Highly suggestive of malignancy - appropriate action should be taken.  Biopsy will be done today.   Electronically Signed   By: Marin Olp M.D.   On: 10/11/2013 16:33   [place] L  10/11/2013   CLINICAL DATA:  Patient presents for additional views of the left breast as followup to a recent screening exam suggesting a possible mass.  EXAM: DIGITAL DIAGNOSTIC UNILATERAL LEFT MAMMOGRAM LIMITED; ULTRASOUND LEFT BREAST  COMPARISON:  09/01/2012, 09/27/2011, 07/19/2010, 05/26/2009, 04/01/2008 and 02/05/2007  ACR Breast Density Category b: There are scattered areas of fibroglandular density.  FINDINGS: Spot compression views demonstrate a spiculated 1 cm mass over the slightly inner upper left breast.  Ultrasound is performed, showing an irregular bordered hypoechoic solid mass with minimal shadowing at the 11:30 position of the left breast 8 cm from the nipple measuring 8 x 9 x 9 mm. Ultrasound of the left axilla demonstrates no abnormal appearing lymph nodes.  IMPRESSION: Suspicious 8 x 9 x 9 mm irregular bordered hypoechoic mass at the 11:30 position of the left breast 8 cm from the nipple.  RECOMMENDATION: Recommend ultrasound-guided core needle biopsy of the suspicious mass.  I have discussed the findings and recommendations with the patient. Results were also provided in writing at the conclusion of the visit. If applicable, a reminder letter will be sent to the patient regarding the next appointment.  BI-RADS CATEGORY  5: Highly suggestive of malignancy - appropriate action should be taken.  Biopsy will be done today.   Electronically Signed   By: Marin Olp M.D.   On: 10/11/2013 16:33   Mm Digital Diagnostic Unilat L  10/11/2013   CLINICAL DATA:  The patient is post ultrasound-guided core biopsy of a 9 mm spiculated mass over the 11:30 position of the left breast.  EXAM: DIGITAL DIAGNOSTIC UNILATERAL LEFT MAMMOGRAM  COMPARISON:  Previous exams  FINDINGS: Mammographic images were obtained following ultrasound guided biopsy of an irregular 9 mm mass of the 11:30 position of the left breast. Exam demonstrates satisfactory placement of a cylindrical shaped metallic clip over the  biopsied mass in the upper inner left breast.  IMPRESSION: Satisfactory clip placement post ultrasound core biopsy left breast.  Final Assessment: Post Procedure Mammograms for Marker Placement   Electronically Signed   By: Marin Olp M.D.   On: 10/11/2013 17:04   Mm Radiologist Eval And Mgmt  10/12/2013   EXAM: ESTABLISHED PATIENT OFFICE VISIT -LEVEL II (16967)  HISTORY OF PRESENT ILLNESS: Screening detected left breast mass. Ultrasound-guided core needle biopsy  was performed yesterday. Pathology returned as grade 2-3 invasive ductal carcinoma.  Patient denies significant pain or bleeding at the biopsy site.  CHIEF COMPLAINT: Patient returns 1 day post ultrasound-guided core needle biopsy of a screening detected 0.9 cm mass in the upper left breast.  PHYSICAL EXAMINATION: Biopsy site in the upper left breast is clean without evidence of bleeding from the incision. The breast is soft on palpation and there is no evidence of hematoma.  ASSESSMENT AND PLAN: 0.9 cm mass in the upper left breast, pathology from ultrasound-guided core needle biopsy revealing grade 2-3 invasive ductal carcinoma, concordant with imaging findings.  Patient is scheduled for the Multidisciplinary Breast Cancer Clinic at the [facility] at [facility] on Wednesday, October 22.   Electronically Signed   By: Evangeline Dakin M.D.   On: 10/12/2013 17:08   [place] Lt Breast Bx W Loc Dev 1st Lesion Img Bx Spec US Guide  10/11/2013   CLINICAL DATA:  Patient presents for ultrasound-guided core needle biopsy of a 9 mm suspicious mass at the 11:30 position of the left breast 8 cm from the nipple.  EXAM: RADIOLOGY EXAMINATION  COMPARISON:  Previous exams.  PROCEDURE: I met with the patient and we discussed the procedure of ultrasound-guided biopsy, including benefits and alternatives. We discussed the high likelihood of a successful procedure. We discussed the risks of the procedure including infection, bleeding, tissue  injury, clip migration, and inadequate sampling. Informed written consent was given.  Using sterile technique and 2% Lidocaine as local anesthetic, under direct ultrasound visualization, a 12 gauge vacuum-assisteddevice was used to perform biopsy of the targeted mass at the 11:30 position using a lateral to medial approach. At the conclusion of the procedure, a top hat shaped tissue marker clip was deployed into the biopsy cavity. Follow-up 2-view mammogram was performed and dictated separately.  The usual time-out protocol was performed immediately prior to the procedure.  IMPRESSION: Ultrasound-guided biopsy of a suspicious left breast mass. No apparent complications.   Electronically Signed   By: Marin Olp M.D.   On: 10/11/2013 16:41     LABS:    Chemistry      Component Value Date/Time   NA 140 02/15/2014 0907   NA 140 04/18/2011 1136   K 3.6 02/15/2014 0907   K 3.8 04/18/2011 1136   CL 103 04/18/2011 1136   CO2 26 02/15/2014 0907   CO2 28 04/18/2011 1136   BUN 10.2 02/15/2014 0907   BUN 13 04/18/2011 1136   CREATININE 0.8 02/15/2014 0907   CREATININE 0.6 04/18/2011 1136      Component Value Date/Time   CALCIUM 9.4 02/15/2014 0907   CALCIUM 9.3 04/18/2011 1136   ALKPHOS 58 02/15/2014 0907   AST 11 02/15/2014 0907   ALT 14 02/15/2014 0907   BILITOT 0.94 02/15/2014 0907      Lab Results  Component Value Date   WBC 3.2* 02/15/2014   HGB 9.9* 02/15/2014   HCT 31.0* 02/15/2014   MCV 86.1 02/15/2014   PLT 247 02/15/2014   PATHOLOGY: ADDITIONAL INFORMATION: 1. A sample (block 1B) was sent to [facility] for Oncotype testing. The patient's recurrence score is 29. Those patients who had a recurrence score of 29 had an average rate of distant recurrence of 17%. Enid Cutter MD Pathologist, Electronic Signature ( Signed 11/22/2013) 1. CHROMOGENIC IN-SITU HYBRIDIZATION Results: HER-2/NEU BY CISH - NO AMPLIFICATION OF HER-2 DETECTED. RESULT RATIO OF HER2: CEP 17 SIGNALS 0.95 AVERAGE HER2  COPY NUMBER  PER CELL 1.95 REFERENCE RANGE NEGATIVE HER2/Chr17 Ratio <2.0 and Average HER2 copy number <4.0 EQUIVOCAL HER2/Chr17 Ratio <2.0 and Average HER2 copy number 4.0 and <6.0 POSITIVE HER2/Chr17 Ratio >=2.0 and/or Average HER2 copy number >=6.0 Enid Cutter MD Pathologist, Electronic Signature ( Signed 11/04/2013) FINAL DIAGNOSIS Diagnosis 1. Breast, lumpectomy, Left - INVASIVE DUCTAL CARCINOMA, SEE COMMENT. 1 of 4 FINAL for Veronica Rogers, Veronica Rogers [phone]) Diagnosis(continued) - LYMPHOVASCULAR INVASION IDENTIFIED. - INVASIVE TUMOR IS 1 MM FROM NEAREST MARGIN (MEDIAL). - PREVIOUS BIOPSY SITE IDENTIFIED. - SEE TUMOR TEMPLATE BELOW. 2. Breast, excision, left - BENIGN BREAST TISSUE, SEE COMMENT. - NEGATIVE FOR ATYPIA OR MALIGNANCY. - SURGICAL MARGINS, NEGATIVE FOR ATYPIA OR MALIGNANCY. 3. Lymph node, sentinel, biopsy, #1 left Axilla - ONE LYMPH NODE, POSITIVE FOR METASTATIC MAMMARY CARCINOMA (1/1). - TUMOR DEPOSIT IS 1 MM. - NO EXTRACAPSULAR TUMOR EXTENSION IDENTIFIED. 4. Lymph nodes, regional resection, left Axilla - THREE LYMPH NODES, NEGATIVE FOR TUMOR (0/3). 5. Lymph node, sentinel, biopsy, #2 left - ONE LYMPH NODE, NEGATIVE FOR TUMOR (0/1). 6. Lymph node, sentinel, biopsy, #3 left - ONE LYMPH NODE, NEGATIVE FOR TUMOR (0/1). 7. Lymph node, sentinel, biopsy, #4 left - ONE LYMPH NODE, NEGATIVE FOR TUMOR (0/1). Microscopic Comment 1. BREAST, INVASIVE TUMOR, WITH LYMPH NODE SAMPLING Specimen, including laterality and lymph node sampling (sentinel, non-sentinel): Left breast plus sentinel lymph nodes Procedure: Lumpectomy Histologic type: Ductal Grade: II of III Tubule formation: II Nuclear pleomorphism: II Mitotic:II Tumor size (gross measurement): 0.9 cm Margins: Invasive, distance to closest margin: 1 mm In-situ, distance to closest margin: N/A If margin positive, focally or broadly: N/A Lymphovascular invasion: Present Ductal carcinoma in situ: Absent Grade:  N/A Extensive intraductal component: N/A Lobular neoplasia: Absent Tumor focality: Unifocal Treatment effect: None If present, treatment effect in breast tissue, lymph nodes or both: N/A Extent of tumor: Skin: N/A Nipple: N/A Skeletal muscle: N/A Lymph nodes: Examined: 4 Sentinel 3 Non-sentinel 7 Total 2 of 4 FINAL for Veronica Rogers, Veronica Rogers [phone]) Microscopic Comment(continued) Lymph nodes with metastasis: 1 Isolated tumor cells (< 0.2 mm): N/A Micrometastasis: (> 0.2 mm and < 2.0 mm): 1 = 1 mm Macrometastasis: (> 2.0 mm): N/A Extracapsular extension: Absent Breast prognostic profile: Estrogen receptor: Not repeated, previous study demonstrated 100% positivity ([id number]) Progesterone receptor: Not repeated, previous study demonstrated 100% positivity ([id number]) Her 2 neu: Repeated, previous study demonstrated no amplification (1.85) ([id number]) Ki-67: Not repeated, previous study demonstrated 46% proliferative rate ([id number]) Non-neoplastic breast: Previous biopsy site TNM: pT1b, pN1, pMX Comments: None 2. There is no mass grossly identified. Representative section, including margins, demonstrate a nonneoplastic to include fibrocystic change, pseudoangiomatous stromal hyperplasia, and usual ductal hyperplasia. There are no atypical or malignant epithelial or stromal features present. [place]i RUND DO Pathologist, Electronic Signature (Case signed 11/02/2013) Intraoperative Diagnosis RAPID INTRAOPERAS  ASSESSMENT/PLAN:    53 year old female with  #1 a screen detected left breast mass measuring 9 mm by ultrasound at the 11:30 o'clock position 8 cm from the nipple. She did not get an MRI. 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   #2 final pathology revealed grade 2, 0.9 cm invasive ductal carcinoma one sentinel node was  positive for metastatic disease. Tumor ER positive PR positive HER-2/neu negative with an elevated Ki-67.  #3 Oncotype DX testing recurrence score 29 giving her a 17% risk of distant recurrence at 5 years with tamoxifen alone. I have recommended adjuvant  curative intent chemotherapy consisting of Taxotere Cytoxan for a total of 4 cycles. Risks benefits and side effects were discussed with the patient.  #4 S/P  cycle 4 of adjuvant curative intent Taxotere Cytoxan today  #5 Bilateral lower extremity swelling and pain resolved  #6 plantar and palmar erythema: resolved  #7 Dry skin and dermatitis secondary to chemotherapy related: I have asked the patient to apply calamine lotion in the morning and Aquaphor or Vaseline skin cream  in the evening and to wear cotton gloves and socks after application  #8 nausea: Controlled with medications  #9 patient will be seeing Dr. Thea Silversmith tomorrow for adjuvant radiation therapy consultation.  #10 she also would like to have her Port-A-Cath removed and I have sent a message to Dr. Donne Hazel regarding this.  #11 patient will be seen back by me in 3 months time in followup or sooner if need arises.   All questions were answered. The patient knows to call the clinic with any problems, questions or concerns. We can certainly see the patient much sooner if necessary.  I spent 20 minutes counseling the patient face to face. The total time spent in the appointment was 30 minutes.  Marcy Panning, MD Medical/Oncology [facility] [phone] (beeper) [phone] (Office)

## 2014-02-15 NOTE — Patient Instructions (Signed)
Dehydration, Adult  Dehydration means your body does not have as much fluid as it needs. Your kidneys, brain, and heart will not work properly without the right amount of fluids and salt.   HOME CARE   Ask your doctor how to replace body fluid losses (rehydrate).   Drink enough fluids to keep your pee (urine) clear or pale yellow.   Drink small amounts of fluids often if you feel sick to your stomach (nauseous) or throw up (vomit).   Eat like you normally do.   Avoid:   Foods or drinks high in sugar.   Bubbly (carbonated) drinks.   Juice.   Very hot or cold fluids.   Drinks with caffeine.   Fatty, greasy foods.   Alcohol.   Tobacco.   Eating too much.   Gelatin desserts.   Wash your hands to avoid spreading germs (bacteria, viruses).   Only take medicine as told by your doctor.   Keep all doctor visits as told.  GET HELP RIGHT AWAY IF:    You cannot drink something without throwing up.   You get worse even with treatment.   Your vomit has blood in it or looks greenish.   Your poop (stool) has blood in it or looks black and tarry.   You have not peed in 6 to 8 hours.   You pee a small amount of very dark pee.   You have a fever.   You pass out (faint).   You have belly (abdominal) pain that gets worse or stays in one spot (localizes).   You have a rash, stiff neck, or bad headache.   You get easily annoyed, sleepy, or are hard to wake up.   You feel weak, dizzy, or very thirsty.  MAKE SURE YOU:    Understand these instructions.   Will watch your condition.   Will get help right away if you are not doing well or get worse.  Document Released: 10/12/2009 Document Revised: 03/09/2012 Document Reviewed: 08/05/2011  [employer] Patient Information 2014 [employer]

## 2014-02-15 NOTE — Progress Notes (Signed)
Location of Breast Cancer:Invasive ductal carcinoma of left breast 11:30 o'clock, grade 2 0.9 cm  Histology per Pathology Report: 1. Breast, lumpectomy, Left - INVASIVE DUCTAL CARCINOMA, SEE COMMENT. 1 of 4 FINAL for Veronica Rogers, Veronica Rogers [phone]) Diagnosis(continued) - LYMPHOVASCULAR INVASION IDENTIFIED. - INVASIVE TUMOR IS 1 MM FROM NEAREST MARGIN (MEDIAL). - PREVIOUS BIOPSY SITE IDENTIFIED. - SEE TUMOR TEMPLATE BELOW. 2. Breast, excision, left - BENIGN BREAST TISSUE, SEE COMMENT. - NEGATIVE FOR ATYPIA OR MALIGNANCY. - SURGICAL MARGINS, NEGATIVE FOR ATYPIA OR MALIGNANCY. 3. Lymph node, sentinel, biopsy, #1 left Axilla - ONE LYMPH NODE, POSITIVE FOR METASTATIC MAMMARY CARCINOMA (1/1). - TUMOR DEPOSIT IS 1 MM. - NO EXTRACAPSULAR TUMOR EXTENSION IDENTIFIED. 4. Lymph nodes, regional resection, left Axilla - THREE LYMPH NODES, NEGATIVE FOR TUMOR (0/3). 5. Lymph node, sentinel, biopsy, #2 left - ONE LYMPH NODE, NEGATIVE FOR TUMOR (0/1). 6. Lymph node, sentinel, biopsy, #3 left - ONE LYMPH NODE, NEGATIVE FOR TUMOR (0/1). 7. Lymph node, sentinel, biopsy, #4 left - ONE LYMPH NODE, NEGATIVE FOR TUMOR (0/1). Microscopic Comment 1. BREAST, INVASIVE TUMOR, WITH LYMPH NODE SAMPLING Specimen, including laterality and lymph node sampling (sentinel, non-sentinel): Left breast plus sentinel lymph nodes Procedure: Lumpectomy Histologic type: Ductal Grade: II of III Tubule formation: II Nuclear pleomorphism: II Mitotic:II Tumor size (gross measurement): 0.9 cm Margins: Invasive, distance to closest margin: 1 mm In-situ, distance to closest margin: N/A If margin positive, focally or broadly: N/A Lymphovascular invasion: Present Ductal carcinoma in situ: Absent Grade: N/A Extensive intraductal component: N/A Lobular neoplasia: Absent Tumor focality: Unifocal Treatment effect: None If present, treatment effect in breast tissue, lymph nodes or both: N/A Extent of tumor: Skin:  N/A Nipple: N/A Skeletal muscle: N/A Lymph nodes: Examined: 4 Sentinel 3 Non-sentinel 7 Total 2 of  Receptor Status: ER(+), PR (+), Her2-neu (-)  Did patient present with symptoms;left breast mass detected on screening mammogram on 09/23/13 and ultrasound guided core needle biopsy on 10/11/13   Past/Anticipated interventions by surgeon, if any:10/29/2013:Left breast lumpectomy by Dr.Wakefield  Past/Anticipated interventions by medical oncology, if any: Chemotherapy consisted of 4 cycles of Taxotere and Cytoxan from 12/07/13-02/08/14 Oncotype score 29  Lymphedema issues:No  Pain issues, 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  SAFETY ISSUES:  Prior radiation?No  Pacemaker/ICD?No  Possible current pregnancy?No  Is the patient on methotrexate? No  Current Complaints / other details: Menses age 28.First full-term birth age 33. 2 children.No HRT.No family history of breast or female cancers. 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    Arlyss Repress, RN 02/15/2014,2:09 PM

## 2014-02-16 ENCOUNTER — Encounter: Payer: Self-pay | Admitting: Radiation Oncology

## 2014-02-16 ENCOUNTER — Ambulatory Visit
Admission: RE | Admit: 2014-02-16 | Discharge: 2014-02-16 | Disposition: A | Discharge status: Home / Self Care | Payer: 59 | Source: Ambulatory Visit | Attending: Radiation Oncology | Admitting: Radiation Oncology

## 2014-02-16 VITALS — BP 127/62 | HR 107 | Temp 98.3°F | Wt 199.8 lb

## 2014-02-16 DIAGNOSIS — C50212 Malignant neoplasm of upper-inner quadrant of left female breast: Secondary | ICD-10-CM

## 2014-02-16 DIAGNOSIS — C50919 Malignant neoplasm of unspecified site of unspecified female breast: Secondary | ICD-10-CM | POA: Insufficient documentation

## 2014-02-16 DIAGNOSIS — G589 Mononeuropathy, unspecified: Secondary | ICD-10-CM | POA: Insufficient documentation

## 2014-02-16 DIAGNOSIS — Z794 Long term (current) use of insulin: Secondary | ICD-10-CM | POA: Insufficient documentation

## 2014-02-16 DIAGNOSIS — Z79899 Other long term (current) drug therapy: Secondary | ICD-10-CM | POA: Insufficient documentation

## 2014-02-16 DIAGNOSIS — Z9221 Personal history of antineoplastic chemotherapy: Secondary | ICD-10-CM | POA: Insufficient documentation

## 2014-02-16 NOTE — Progress Notes (Signed)
Please see the Nurse Progress Note in the MD Initial Consult Encounter for this patient. 

## 2014-02-16 NOTE — Progress Notes (Addendum)
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  She had her lumpectomy on October 31. This showed invasive ductal carcinoma with lymphovascular invasion. Margins were negative. Her tumor measured 0.9 cm. One out of of 7 lymph nodes was positive for micrometastases. Her ER was positive at 100% PR positive at 100% HER-2 was negative and Ki-67 was 46%. She had a Oncotype score of 29 and therefore received chemotherapy. She has completed chemotherapy.  She is interested in getting her portacath out prior to beginning RT. She would like to have it out in the hospital to avoid the multiple medical bills associated with the surgery center. She has discussed this with Dr. Humphrey Rolls. She has normal range of motion of her arm. She hopes to work full time while getting radiation. She is tired and has some neuropathy. She would like to proceed forward with radiation as soon as possible.  Allergies:  Allergies  Allergen Reactions  . Erythromycin Other (See Comments)    Stomach pain  . Tape Rash    Medications:  Current Outpatient Prescriptions  Medication Sig Dispense Refill  . calamine lotion Apply 1 application topically 2 (two) times daily. Apply twice  Daily to the affected area for one week  120 mL  0  . dexamethasone (DECADRON) 4 MG tablet TAKE 2 TABLETS TWICE A DAY WITH FOOD DAY BEFORE CHEMO THEN 2 TABS TWICE A DAY DAY AFTER X3 DAYS  30 tablet  1  . Disposable Gloves (COTTON GLOVES MEDIUM) MISC 2 each by Does not apply route 2 (two) times daily.  4 each  0  . glucose blood (TRUETEST TEST) test strip 1 each by Other route every morning. 1 Box  50 each  3  . Insulin Glargine (LANTUS) 100 UNIT/ML Solostar Pen Inject 18 Units into the skin at bedtime.   2 pen  11  . Insulin Pen Needle (CLICKFINE PEN NEEDLES) 32G X 4 MM MISC Inject 1 each into the skin daily.  50 each  1  . lidocaine-prilocaine (EMLA) cream Apply 1 application topically as needed.  30 g  1  . LORazepam (ATIVAN) 0.5 MG tablet Take 1 tablet (0.5 mg) every 6 hours as needed for nausea or vomiting.  30 tablet  0  . ondansetron (ZOFRAN) 8 MG tablet Take two times a day starting the day after chemotherapy for 3 days. Then take two times a day as needed for nausea or vomiting.  30 tablet  1  . prochlorperazine (COMPAZINE) 10 MG tablet Take 1 tablet (10 mg total) by mouth every 6 (six) hours as needed for nausea or vomiting.  30 tablet  1  . pyridOXINE (VITAMIN B-6) 50 MG tablet Take 1 tablet (50 mg total) by mouth daily.  30 tablet  0  . sertraline (ZOLOFT) 50 MG tablet Take 25 mg by mouth daily.       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  1 Units  0   No current facility-administered medications for this encounter.    Physical Exam:  Filed Vitals:   02/16/14 1510  BP: 127/62  Pulse: 107  Temp: 98.3 F (36.8 C)  Weight: 199 lb 12.8 oz (90.629 kg)  SpO2: 99%  pleasant female in no distress sitting comfortably on the examining room table. She is alert and oriented x3. She has good range of motion of her bilateral arms.  IMPRESSION: Veronica Rogers is a 53 y.o. female s/p   PLAN:  I spoke make [facility] that usually we remove the Port-A-Cath after radiation. She really wants this removed prior to that. She has a trip coming up in June and would like to complete her radiation before then. I think that we'll be easy to do. We discussed the use of radiation and decreasing local failures in patients who undergo breast conservation. We discussed the process of simulation the placement tattoos. We discussed 6 weeks of treatment as an outpatient. We discussed the possible use of breath will technique for cardiac sparing. We discussed possible treatment of her  lymph nodes with high tangents given that she only had 7 lymph nodes removed and one had a micrometastases. I will review this with Dr. Humphrey Rolls and Dr. Donne Hazel to get their input. We discussed the low likelihood of symptomatic rib and lung damage. We discussed the low likelihood of secondary malignancies. She has signed informed consent and will schedule her simulation as soon as she has her date for her Port-A-Cath removal. I discussed with her that we really would wait about 2 weeks her Port-A-Cath incision healed before we would proceed on with simulation and treatment planning.    Thea Silversmith, MD

## 2014-02-17 ENCOUNTER — Telehealth: Payer: Self-pay | Admitting: Oncology

## 2014-02-17 NOTE — Telephone Encounter (Signed)
, °

## 2014-02-21 ENCOUNTER — Other Ambulatory Visit: Payer: Self-pay | Admitting: *Deleted

## 2014-02-21 ENCOUNTER — Telehealth: Payer: Self-pay | Admitting: *Deleted

## 2014-02-21 NOTE — Telephone Encounter (Signed)
Patient called and stated, "Dr. Humphrey Rolls was going to make a referral to Dr. Donne Hazel, and I haven't heard anything." Per Dr. Humphrey Rolls, she has already sent Dr. Donne Hazel a message. I informed patient to call his office and make an appointment. Patient verbalized understanding.

## 2014-02-22 ENCOUNTER — Telehealth (INDEPENDENT_AMBULATORY_CARE_PROVIDER_SITE_OTHER): Payer: Self-pay

## 2014-02-22 NOTE — Telephone Encounter (Signed)
LMOM at home and cell for pt to call me back. I want to talk to her about the [facility] removal that we need to get pt scheduled for with Dr Donne Hazel.

## 2014-02-23 ENCOUNTER — Telehealth: Payer: Self-pay | Admitting: *Deleted

## 2014-02-23 ENCOUNTER — Other Ambulatory Visit: Payer: Self-pay | Admitting: *Deleted

## 2014-02-23 DIAGNOSIS — C50212 Malignant neoplasm of upper-inner quadrant of left female breast: Secondary | ICD-10-CM

## 2014-02-23 MED ORDER — MEDICAL COMPRESSION STOCKINGS MISC: Status: DC

## 2014-02-23 NOTE — Telephone Encounter (Signed)
Dr. Humphrey Rolls notified.  OK for order of compression stockings.  Spoke with Tye Maryland at [employer], and was informed that verbal order is not accepted ; has to have a faxed order from md's office.  Per Tye Maryland, pt has not been seen by the provider at the store,  and the measurements given by pt were not suggested by [employer].   Prescription form printed and left with Erline Levine, desk nurse for Dr. Humphrey Rolls to sign.  Once signed, prescription can be faxed to [place] at   [phone]  ;  Phone   [phone].

## 2014-02-23 NOTE — Telephone Encounter (Signed)
Received call from pt stating she has had swelling in both feet and ankles.  Pt had doppler study done - ordered by Dr. Earnest Conroy - with negative for DVT.  Pt stated she spoke with a friend who is in medical field and was told that pt needed compression stockings.   Pt was instruced by [employer] that the stockings should be : 20 - 30  Compression ,  Below the knees. Needs  2  Pairs . Pt stated the order can be faxed to  [facility]. Informed pt that message will be relayed to Dr. Humphrey Rolls.  Pt will be contacted with further instructions from md. Pt's  Phone    [phone].

## 2014-02-25 ENCOUNTER — Encounter: Payer: Self-pay | Admitting: Oncology

## 2014-02-25 ENCOUNTER — Other Ambulatory Visit: Payer: Self-pay | Admitting: *Deleted

## 2014-02-25 DIAGNOSIS — C50212 Malignant neoplasm of upper-inner quadrant of left female breast: Secondary | ICD-10-CM

## 2014-02-25 MED ORDER — MEDICAL COMPRESSION STOCKINGS MISC: Status: DC

## 2014-02-25 NOTE — Telephone Encounter (Signed)
Patient called back at this time to speak with you.  Explained that you would not be back until Monday.  Patient asked to be called at her work number first then her cell phone number second.

## 2014-02-25 NOTE — Addendum Note (Signed)
Encounter addended by: Deirdre Evener, RN on: 02/25/2014 10:46 AM<BR>     Documentation filed: Charges VN

## 2014-02-28 ENCOUNTER — Encounter: Payer: Self-pay | Admitting: *Deleted

## 2014-02-28 NOTE — Progress Notes (Signed)
This RN faxed a prescription so patient could pick up her medical supplies, elastic bandages. Patient verbalized understanding.

## 2014-02-28 NOTE — Telephone Encounter (Signed)
Pt calling requesting call back at her work # [phone].

## 2014-03-01 ENCOUNTER — Telehealth (INDEPENDENT_AMBULATORY_CARE_PROVIDER_SITE_OTHER): Payer: Self-pay

## 2014-03-01 DIAGNOSIS — C50912 Malignant neoplasm of unspecified site of left female breast: Secondary | ICD-10-CM

## 2014-03-01 NOTE — Telephone Encounter (Signed)
Patient asking to be schedules for port a cath removal, according to Dr. Janyce Llanos on 02-16-14 port a cath surgery  can be scheduled to be removed before starting her radiation. Appointment Dr. Donne Hazel 03-04-14 @ 1100am patient verbalized understanding to date and time

## 2014-03-03 ENCOUNTER — Other Ambulatory Visit: Payer: Self-pay | Admitting: Radiology

## 2014-03-03 NOTE — Telephone Encounter (Signed)
Called pt to see what the pt would like to do about the [facility] removal since IR placed the [facility] would she like to go back there to have removed or see Dr Donne Hazel to have it removed. The pt wants to go back to IR to have it removed. The pt stated that she made the appt with Dr Donne Hazel for tomorrow to come in to get him to do the order for IR to remove the [facility]. I advised her that Dr Laurelyn Sickle office could take care of the order for her and she wouldn't have to come see DR Donne Hazel unless if she wanted to see him we would be glad to see her. The pt said Dr Humphrey Rolls told her she couldn't do the order for the [facility] removal b/c Dr Donne Hazel is the one that placed the order for IR to put the [facility] in. I told the pt that I would check with Dr Donne Hazel and call her back. The pt is fine with not coming in tomorrow if she doesn't have to and just place the order for IR. I told her I would get back in touch with her.

## 2014-03-03 NOTE — Telephone Encounter (Signed)
Sure you can put the order in to take the port out with ir.  Whatever is easiest for her

## 2014-03-03 NOTE — Telephone Encounter (Signed)
Called pt to notify her that Dr Donne Hazel was fine with [place] placing the order in epic. I have completed the orders and asked IR to contact pt at work to schedule the appt. The pt is fine with me canceling her appt with Dr Donne Hazel for 03/04/14.

## 2014-03-03 NOTE — Addendum Note (Signed)
Addended by: Illene Regulus on: 03/03/2014 11:24 AM   Modules accepted: Orders

## 2014-03-04 ENCOUNTER — Encounter (INDEPENDENT_AMBULATORY_CARE_PROVIDER_SITE_OTHER): Payer: Commercial Managed Care - PPO | Admitting: General Surgery

## 2014-03-07 ENCOUNTER — Other Ambulatory Visit: Payer: Self-pay | Admitting: *Deleted

## 2014-03-07 ENCOUNTER — Encounter (HOSPITAL_COMMUNITY): Payer: Self-pay | Admitting: Pharmacy Technician

## 2014-03-07 ENCOUNTER — Telehealth: Payer: Self-pay | Admitting: *Deleted

## 2014-03-07 ENCOUNTER — Telehealth: Payer: Self-pay | Admitting: Oncology

## 2014-03-07 DIAGNOSIS — R030 Elevated blood-pressure reading, without diagnosis of hypertension: Secondary | ICD-10-CM

## 2014-03-07 DIAGNOSIS — C50212 Malignant neoplasm of upper-inner quadrant of left female breast: Secondary | ICD-10-CM

## 2014-03-07 NOTE — Telephone Encounter (Signed)
Patient called with complaints of swelling in her legs, feet and ankles bilaterally. Patient stated, "at the end of the day my feet are swollen. I prop them up but that doesn't seem to help. I eat normally, like I did before chemotherapy. I don't put any extra salt on my food. So, my sodium level should be fine." Per Dr. Humphrey Rolls, patient needs to be seen. Schedule her with Mendel Ryder. She needs labs and a doppler. POF sent to scheduling. Patient called and verbalized understanding of appointments tomorrow.

## 2014-03-07 NOTE — Telephone Encounter (Signed)
, °

## 2014-03-08 ENCOUNTER — Other Ambulatory Visit (HOSPITAL_BASED_OUTPATIENT_CLINIC_OR_DEPARTMENT_OTHER): Payer: 59

## 2014-03-08 ENCOUNTER — Encounter: Payer: Self-pay | Admitting: Adult Health

## 2014-03-08 ENCOUNTER — Ambulatory Visit (HOSPITAL_COMMUNITY)
Admission: RE | Admit: 2014-03-08 | Discharge: 2014-03-08 | Disposition: A | Discharge status: Home / Self Care | Payer: 59 | Source: Ambulatory Visit | Attending: General Surgery | Admitting: General Surgery

## 2014-03-08 ENCOUNTER — Ambulatory Visit (HOSPITAL_BASED_OUTPATIENT_CLINIC_OR_DEPARTMENT_OTHER): Payer: 59 | Admitting: Adult Health

## 2014-03-08 ENCOUNTER — Encounter (HOSPITAL_COMMUNITY): Payer: 59

## 2014-03-08 ENCOUNTER — Encounter (HOSPITAL_COMMUNITY): Payer: Self-pay

## 2014-03-08 VITALS — BP 137/79 | HR 112 | Temp 98.1°F | Resp 20 | Ht 68.0 in | Wt 202.2 lb

## 2014-03-08 VITALS — BP 113/58 | HR 89 | Temp 98.4°F | Resp 16 | Ht 68.0 in | Wt 205.0 lb

## 2014-03-08 DIAGNOSIS — C50219 Malignant neoplasm of upper-inner quadrant of unspecified female breast: Secondary | ICD-10-CM

## 2014-03-08 DIAGNOSIS — M7989 Other specified soft tissue disorders: Secondary | ICD-10-CM

## 2014-03-08 DIAGNOSIS — C50212 Malignant neoplasm of upper-inner quadrant of left female breast: Secondary | ICD-10-CM

## 2014-03-08 DIAGNOSIS — C50912 Malignant neoplasm of unspecified site of left female breast: Secondary | ICD-10-CM

## 2014-03-08 DIAGNOSIS — Z170 Estrogen receptor positive status [ER+]: Secondary | ICD-10-CM

## 2014-03-08 DIAGNOSIS — F411 Generalized anxiety disorder: Secondary | ICD-10-CM | POA: Insufficient documentation

## 2014-03-08 DIAGNOSIS — E860 Dehydration: Secondary | ICD-10-CM

## 2014-03-08 DIAGNOSIS — Z452 Encounter for adjustment and management of vascular access device: Secondary | ICD-10-CM | POA: Insufficient documentation

## 2014-03-08 DIAGNOSIS — C50919 Malignant neoplasm of unspecified site of unspecified female breast: Secondary | ICD-10-CM | POA: Insufficient documentation

## 2014-03-08 DIAGNOSIS — C773 Secondary and unspecified malignant neoplasm of axilla and upper limb lymph nodes: Secondary | ICD-10-CM

## 2014-03-08 DIAGNOSIS — Z9221 Personal history of antineoplastic chemotherapy: Secondary | ICD-10-CM | POA: Insufficient documentation

## 2014-03-08 DIAGNOSIS — R030 Elevated blood-pressure reading, without diagnosis of hypertension: Secondary | ICD-10-CM

## 2014-03-08 DIAGNOSIS — F329 Major depressive disorder, single episode, unspecified: Secondary | ICD-10-CM | POA: Insufficient documentation

## 2014-03-08 DIAGNOSIS — F3289 Other specified depressive episodes: Secondary | ICD-10-CM | POA: Insufficient documentation

## 2014-03-08 LAB — CBC WITH DIFFERENTIAL/PLATELET
BASO%: 1.5 % (ref 0.0–2.0)
BASOS ABS: 0.1 10*3/uL (ref 0.0–0.1)
Basophils Absolute: 0.1 10*3/uL (ref 0.0–0.1)
Basophils Relative: 1 % (ref 0–1)
EOS%: 3 % (ref 0.0–7.0)
Eosinophils Absolute: 0.2 10*3/uL (ref 0.0–0.5)
Eosinophils Absolute: 0.2 10*3/uL (ref 0.0–0.7)
Eosinophils Relative: 2 % (ref 0–5)
HCT: 33.2 % — ABNORMAL LOW (ref 36.0–46.0)
HEMATOCRIT: 33.2 % — AB (ref 34.8–46.6)
HEMOGLOBIN: 10.7 g/dL — AB (ref 11.6–15.9)
HEMOGLOBIN: 10.5 g/dL — AB (ref 12.0–15.0)
LYMPH#: 2.7 10*3/uL (ref 0.9–3.3)
LYMPH%: 37.1 % (ref 14.0–49.7)
Lymphocytes Relative: 41 % (ref 12–46)
Lymphs Abs: 3.1 10*3/uL (ref 0.7–4.0)
MCH: 27.9 pg (ref 25.1–34.0)
MCH: 27.6 pg (ref 26.0–34.0)
MCHC: 32.3 g/dL (ref 31.5–36.0)
MCHC: 31.6 g/dL (ref 30.0–36.0)
MCV: 86.2 fL (ref 79.5–101.0)
MCV: 87.1 fL (ref 78.0–100.0)
MONO#: 0.5 10*3/uL (ref 0.1–0.9)
MONO%: 7.3 % (ref 0.0–14.0)
MONOS PCT: 8 % (ref 3–12)
Monocytes Absolute: 0.6 10*3/uL (ref 0.1–1.0)
NEUT#: 3.7 10*3/uL (ref 1.5–6.5)
NEUT%: 51.1 % (ref 38.4–76.8)
NEUTROS ABS: 3.7 10*3/uL (ref 1.7–7.7)
NEUTROS PCT: 48 % (ref 43–77)
Platelets: 272 10*3/uL (ref 145–400)
Platelets: 274 10*3/uL (ref 150–400)
RBC: 3.85 10*6/uL (ref 3.70–5.45)
RBC: 3.81 MIL/uL — ABNORMAL LOW (ref 3.87–5.11)
RDW: 18.4 % — ABNORMAL HIGH (ref 11.2–14.5)
RDW: 17.5 % — AB (ref 11.5–15.5)
WBC: 7.2 10*3/uL (ref 3.9–10.3)
WBC: 7.7 10*3/uL (ref 4.0–10.5)

## 2014-03-08 LAB — COMPREHENSIVE METABOLIC PANEL (CC13)
ALT: 13 U/L (ref 0–55)
ANION GAP: 10 meq/L (ref 3–11)
AST: 15 U/L (ref 5–34)
Albumin: 3.3 g/dL — ABNORMAL LOW (ref 3.5–5.0)
Alkaline Phosphatase: 43 U/L (ref 40–150)
BUN: 13.9 mg/dL (ref 7.0–26.0)
CALCIUM: 9.7 mg/dL (ref 8.4–10.4)
CHLORIDE: 104 meq/L (ref 98–109)
CO2: 25 meq/L (ref 22–29)
CREATININE: 0.7 mg/dL (ref 0.6–1.1)
GLUCOSE: 198 mg/dL — AB (ref 70–140)
Potassium: 4.3 mEq/L (ref 3.5–5.1)
Sodium: 140 mEq/L (ref 136–145)
Total Bilirubin: 0.72 mg/dL (ref 0.20–1.20)
Total Protein: 6 g/dL — ABNORMAL LOW (ref 6.4–8.3)

## 2014-03-08 LAB — PROTIME-INR
INR: 0.92 (ref 0.00–1.49)
Prothrombin Time: 12.2 seconds (ref 11.6–15.2)

## 2014-03-08 LAB — GLUCOSE, CAPILLARY: GLUCOSE-CAPILLARY: 106 mg/dL — AB (ref 70–99)

## 2014-03-08 MED ORDER — SODIUM CHLORIDE 0.9 % IV SOLN: INTRAVENOUS | Status: DC

## 2014-03-08 MED ORDER — FENTANYL CITRATE 0.05 MG/ML IJ SOLN
INTRAMUSCULAR | Status: AC
  Filled 2014-03-08: qty 6

## 2014-03-08 MED ORDER — LIDOCAINE-EPINEPHRINE (PF) 2 %-1:200000 IJ SOLN
INTRAMUSCULAR | Status: AC
  Filled 2014-03-08: qty 20

## 2014-03-08 MED ORDER — MIDAZOLAM HCL 2 MG/2ML IJ SOLN
INTRAMUSCULAR | Status: AC | PRN
  Administered 2014-03-08 (×3): 1 mg via INTRAVENOUS

## 2014-03-08 MED ORDER — FENTANYL CITRATE 0.05 MG/ML IJ SOLN
INTRAMUSCULAR | Status: AC | PRN
  Administered 2014-03-08: 50 ug via INTRAVENOUS

## 2014-03-08 MED ORDER — SODIUM CHLORIDE 0.9 % IV SOLN
INTRAVENOUS | Status: DC
  Administered 2014-03-08: 500 mL via INTRAVENOUS

## 2014-03-08 MED ORDER — CEFAZOLIN SODIUM-DEXTROSE 2-3 GM-% IV SOLR
2.0000 g | Freq: Once | INTRAVENOUS | Status: AC
Start: 2014-03-08 — End: 2014-03-08
  Administered 2014-03-08: 2 g via INTRAVENOUS
  Filled 2014-03-08: qty 50

## 2014-03-08 MED ORDER — MIDAZOLAM HCL 2 MG/2ML IJ SOLN
INTRAMUSCULAR | Status: AC
  Filled 2014-03-08: qty 6

## 2014-03-08 NOTE — H&P (Signed)
Veronica Rogers is an 53 y.o. female.   Chief Complaint: "I'm getting my port out" HPI: Patient with history of breast carcinoma and s/p completion of chemotherapy presents today for port a cath removal.  Past Medical History  Diagnosis Date  . Breast cancer   . Anxiety   . Depression   . Contact lens/glasses fitting     wears contacts or glasses    Past Surgical History  Procedure Laterality Date  . Breast biopsy  1996    right breast, benign  . Wisdom tooth extraction    . Colonoscopy    . Breast lumpectomy with needle localization and axillary sentinel lymph node bx Left 10/29/2013    Procedure: BREAST LUMPECTOMY WITH NEEDLE LOCALIZATION AND AXILLARY SENTINEL LYMPH NODE BX;  Surgeon: Rolm Bookbinder, MD;  Location: [facility];  Service: General;  Laterality: Left;    Family History  Problem Relation Age of Onset  . Squamous cell carcinoma Father   . Cancer Father   . Diabetes Father   . Hypertension Mother   . Diabetes Sister    Social History:  reports that she has never smoked. She has never used smokeless tobacco. She reports that she drinks alcohol. She reports that she does not use illicit drugs.  Allergies:  Allergies  Allergen Reactions  . Erythromycin Other (See Comments)    Stomach pain  . Tape Rash    Current outpatient prescriptions:calamine lotion, Apply 1 application topically 2 (two) times daily. Apply twice  Daily to the affected area for one week, Disp: 120 mL, Rfl: 0;  dexamethasone (DECADRON) 4 MG tablet, Take 8 mg by mouth 2 (two) times daily with a meal. 2 tabs bid day before and 3 days after chemo, Disp: , Rfl: ;  Disposable Gloves (COTTON GLOVES MEDIUM) MISC, 2 each by Does not apply route 2 (two) times daily., Disp: 4 each, Rfl: 0 Elastic Bandages & Supports (MEDICAL COMPRESSION STOCKINGS) MISC, 20-30  Compression  Below Knees., Disp: 2 each, Rfl: 1;  glucose blood (TRUETEST TEST) test strip, 1 each by Other route every morning. 1  Box, Disp: 50 each, Rfl: 3;  Insulin Glargine (LANTUS) 100 UNIT/ML Solostar Pen, Inject 18 Units into the skin at bedtime., Disp: 2 pen, Rfl: 11 Insulin Pen Needle (CLICKFINE PEN NEEDLES) 32G X 4 MM MISC, Inject 1 each into the skin daily., Disp: 50 each, Rfl: 1;  lidocaine-prilocaine (EMLA) cream, Apply 1 application topically daily as needed. Port, Disp: , Rfl: ;  LORazepam (ATIVAN) 0.5 MG tablet, Take 1 tablet (0.5 mg) every 6 hours as needed for nausea or vomiting., Disp: 30 tablet, Rfl: 0 ondansetron (ZOFRAN) 8 MG tablet, Take two times a day starting the day after chemotherapy for 3 days. 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., Disp: 1 Units, Rfl: 0 Current facility-administered medications:0.9 %  sodium chloride infusion, , Intravenous, Continuous, Deatra Robinson, MD;  0.9 %  sodium chloride infusion, , Intravenous, Continuous, Ascencion Dike, PA-C;  ceFAZolin (ANCEF) IVPB 2 g/50 mL premix, 2 g, Intravenous, Once, Ascencion Dike, PA-C   Results for orders placed in visit on 03/08/14 (from the past 48 hour(s))  CBC WITH DIFFERENTIAL     Status: Abnormal   Collection Time    03/08/14  8:39 AM      Result Value Ref Range   WBC 7.2  3.9 - 10.3 10e3/uL   NEUT# 3.7  1.5 - 6.5 10e3/uL   HGB 10.7 (*) 11.6 - 15.9 g/dL   HCT 33.2 (*) 34.8 - 46.6 %   Platelets 272  145 - 400 10e3/uL   MCV 86.2  79.5 - 101.0 fL   MCH 27.9  25.1 - 34.0 pg   MCHC 32.3  31.5 - 36.0 g/dL   RBC 3.85  3.70 - 5.45 10e6/uL   RDW 18.4 (*) 11.2 - 14.5 %   lymph#  2.7  0.9 - 3.3 10e3/uL   MONO# 0.5  0.1 - 0.9 10e3/uL   Eosinophils Absolute 0.2  0.0 - 0.5 10e3/uL   Basophils Absolute 0.1  0.0 - 0.1 10e3/uL   NEUT% 51.1  38.4 - 76.8 %   LYMPH% 37.1  14.0 - 49.7 %   MONO% 7.3  0.0 - 14.0 %   EOS% 3.0  0.0 - 7.0 %   BASO% 1.5  0.0 - 2.0 %  COMPREHENSIVE METABOLIC PANEL (VZ56)     Status: Abnormal   Collection Time    03/08/14  8:40 AM      Result Value Ref Range   Sodium 140  136 - 145 mEq/L   Potassium 4.3  3.5 - 5.1 mEq/L   Chloride 104  98 - 109 mEq/L   CO2 25  22 - 29 mEq/L   Glucose 198 (*) 70 - 140 mg/dl   BUN 13.9  7.0 - 26.0 mg/dL   Creatinine 0.7  0.6 - 1.1 mg/dL   Total Bilirubin 0.72  0.20 - 1.20 mg/dL   Alkaline Phosphatase 43  40 - 150 U/L   AST 15  5 - 34 U/L   ALT 13  0 - 55 U/L   Total Protein 6.0 (*) 6.4 - 8.3 g/dL   Albumin 3.3 (*) 3.5 - 5.0 g/dL   Calcium 9.7  8.4 - 10.4 mg/dL   Anion Gap 10  3 - 11 mEq/L   No results found.  Review of Systems  Constitutional: Negative for fever and chills.  Respiratory: Negative for cough and shortness of breath.   Cardiovascular: Negative for chest pain.  Gastrointestinal: Negative for nausea, vomiting and abdominal pain.  Musculoskeletal: Negative for back pain.  Neurological: Negative for headaches.  Endo/Heme/Allergies: Does not bruise/bleed easily.    Blood pressure 120/80, pulse 100, temperature 98.8 F (37.1 C), temperature source Oral, resp. rate 18, SpO2 96.00%. Physical Exam  Constitutional: She is oriented to person, place, and time. She appears well-developed and well-nourished.  Cardiovascular: Regular rhythm.   sl tachy  Respiratory: Effort normal and breath sounds normal.  Clean, intact rt chest wall PAC  GI: Soft. Bowel sounds are normal.  Musculoskeletal: Normal range of motion. She exhibits edema.  Neurological: She is alert and oriented to person, place, and time.     Assessment/Plan Patient with history of breast carcinoma and s/p completion of  chemotherapy presents today for port a cath removal. Details/risks of procedure d/w pt/daughter with their understanding and consent.  Mouhamed Glassco,D KEVIN 03/08/2014, 1:31 PM

## 2014-03-08 NOTE — Progress Notes (Signed)
BRIANCA FORTENBERRY [id number] [birth year] 53 y.o. 03/08/2014 3:16 PM  CC Darene Lamer, MD [place] [address]  DIAGNOSIS:  53 year old female with left breast cancer stage IA.     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 Rolm Bookbinder  PRIOR ONCOLOGIC HISTORY:  ECKO BEASLEY is a 53 y.o. female.  with  #1Patient had a screening mammogram performed that revealed a left breast mass measuring 9 mm. By ultrasound it was 9 mm at the 11:30 o'clock position 8 cm from the nipple.  She did not have an MRI performed. 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 Patient's case was discussed at the multidisciplinary breast conference. She is now seen in the multidisciplinary breast clinic for discussion of treatment options. She was seen by Dr. Rolm Bookbinder as well as Dr. Gery Pray.  #2 Patient is now status post lumpectomy performed on 10/29/2013. Her final pathology did reveal grade 2, 0.9 cm invasive ductal carcinoma that was ER positive PR positive. One sentinel node was positive for micrometastasis.  #3 Patient also had an Oncotype DX testing done and her recurrence score was 29 giving her a 17% risk of distant recurrence.  #4 S/P curative intentTaxotere Cytoxan  12/07/2013- 2/10/15x  4 cycles  CURRENT THERAPY:  Proceed with RT  INTERVAL HISTORY: Mrs. Poteet is a 53 year old female here for an urgent visit due to lower extremity swelling.  This swelling has been ongoing since 01/28/2014.  She had a bilateral lower extremity doppler at that time which was negative.  Her swelling is unchanged since January.  She was  prescribed compression stockings and wore them for three days.  These stockings helped while they were on, but she didn't like the tight feeling, and has since taken them off.  She is concerned about why her legs are still swelling.  At the end of the day her feet are increasingly swollen and she does have difficulty walking.  She elevates her legs at night, and the swelling does decrease.  She denies chest pain, palpitations, DOE, orthopnea.  Her diet consists of soup, subway, fruits, frozen chicken pies, chips.     Past Medical History: Past Medical History  Diagnosis Date  . Breast cancer   . Anxiety   . Depression   . Contact lens/glasses fitting     wears contacts or glasses    Past Surgical History: Past Surgical History  Procedure Laterality Date  . Breast biopsy  1996    right breast, benign  . Wisdom tooth extraction    . Colonoscopy    . Breast lumpectomy with needle localization and axillary sentinel lymph node bx Left 10/29/2013    Procedure: BREAST LUMPECTOMY WITH NEEDLE LOCALIZATION AND AXILLARY SENTINEL LYMPH NODE BX;  Surgeon: Rolm Bookbinder, MD;  Location: [facility];  Service: General;  Laterality: Left;    Family History: Family History  Problem Relation Age of Onset  . Squamous cell carcinoma Father   . Cancer Father   . Diabetes Father   . Hypertension Mother   . Diabetes Sister     Social History History  Substance Use Topics  . Smoking status: Never Smoker   . Smokeless tobacco: Never Used  . Alcohol Use: Yes     Comment: 1-2 drinks a month    Allergies: Allergies  Allergen Reactions  . Erythromycin Other (See Comments)    Stomach pain  . Tape Rash    Current Medications: Current Outpatient Prescriptions  Medication Sig Dispense Refill  . Disposable Gloves (COTTON GLOVES MEDIUM) MISC 2 each by Does not apply route 2 (two) times daily.  4 each  0  . glucose blood (TRUETEST TEST) test strip 1 each by Other route every  morning. 1 Box  50 each  3  . Insulin Glargine (LANTUS) 100 UNIT/ML Solostar Pen Inject 18 Units into the skin at bedtime.  2 pen  11  . Insulin Pen Needle (CLICKFINE PEN NEEDLES) 32G X 4 MM MISC Inject 1 each into the skin daily.  50 each  1  . lidocaine-prilocaine (EMLA) cream Apply 1 application topically daily as needed. Port      . pyridOXINE (VITAMIN B-6) 50 MG tablet Take 1 tablet (50 mg total) by mouth daily.  30 tablet  0  . sertraline (ZOLOFT) 50 MG tablet Take 25 mg by mouth every morning.      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  1 Units  0  . calamine lotion Apply 1 application topically 2 (two) times daily. Apply twice  Daily to the affected area for one week  120 mL  0  . dexamethasone (DECADRON) 4 MG tablet Take 8 mg by mouth 2 (two) times daily with a meal. 2 tabs bid day before and 3 days after chemo      . Elastic Bandages & Supports (MEDICAL COMPRESSION STOCKINGS) MISC 20-30  Compression  Below Knees.  2 each  1  . LORazepam (ATIVAN) 0.5 MG tablet Take 1 tablet (0.5 mg) every 6 hours as needed for nausea or vomiting.  30 tablet  0  . ondansetron (ZOFRAN) 8 MG tablet Take two times a day starting the day after chemotherapy for 3 days. Then take two times a day as needed for nausea or vomiting.  30 tablet  1  . prochlorperazine (COMPAZINE) 10 MG tablet Take 1 tablet (10 mg total) by mouth every 6 (six) hours as needed for nausea or vomiting.  30 tablet  1   No current facility-administered medications for this visit.   Facility-Administered Medications Ordered in Other Visits  Medication Dose Route Frequency Provider Last Rate Last Dose  . 0.9 %  sodium chloride infusion   Intravenous Continuous Deatra Robinson, MD      . 0.9 %  sodium chloride infusion   Intravenous Continuous Ascencion Dike, PA-C 20 mL/hr at 03/08/14 1349 500 mL at 03/08/14 1349  . ceFAZolin (ANCEF) IVPB 2 g/50 mL premix  2 g Intravenous Once [employer], PA-C      .  fentaNYL (SUBLIMAZE) 0.05 MG/ML injection           . lidocaine-EPINEPHrine (XYLOCAINE W/EPI) 2 %-1:200000 (PF) injection           . midazolam (VERSED) 2 MG/2ML injection            REVIEW OF SYSTEMS: A 10 point review of systems was conducted and is otherwise negative except for what is noted above.    PHYSICAL EXAMINATION: Blood pressure 137/79, pulse 112, temperature 98.1 F (36.7 C), temperature source Oral, resp. rate 20, height $RemoveBe'5\' 8"'wSKauERcc$  (  1.727 m), weight 202 lb 3.2 oz (91.717 kg), last menstrual period 11/08/2013. GENERAL: Patient is a well appearing female in no acute distress HEENT:  Sclerae anicteric.  Oropharynx clear and moist. No ulcerations or evidence of oropharyngeal candidiasis. Neck is supple.  NODES:  No cervical, supraclavicular, or axillary lymphadenopathy palpated.  BREAST EXAM:  Deferred. LUNGS:  Clear to auscultation bilaterally.  No wheezes or rhonchi. HEART:  Regular rate and rhythm. No murmur appreciated. ABDOMEN:  Soft, nontender.  Positive, normoactive bowel sounds. No organomegaly palpated. MSK:  No focal spinal tenderness to palpation. Full range of motion bilaterally in the upper extremities. EXTREMITIES:  1+ bilateral lower extremity edema  SKIN:  Mild peeling on bilateral feet NEURO:  Nonfocal. Well oriented.  Appropriate affect. ECOG PERFORMANCE STATUS: 0 - Asymptomatic   STUDIES/RESULTS: US Breast Left  Oct 22, 2013   CLINICAL DATA:  Patient presents for additional views of the left breast as followup to a recent screening exam suggesting a possible mass.  EXAM: DIGITAL DIAGNOSTIC UNILATERAL LEFT MAMMOGRAM LIMITED; ULTRASOUND LEFT BREAST  COMPARISON:  09/01/2012, 09/27/2011, 07/19/2010, 05/26/2009, 04/01/2008 and 02/05/2007  ACR Breast Density Category b: There are scattered areas of fibroglandular density.  FINDINGS: Spot compression views demonstrate a spiculated 1 cm mass over the slightly inner upper left breast.  Ultrasound is performed, showing an  irregular bordered hypoechoic solid mass with minimal shadowing at the 11:30 position of the left breast 8 cm from the nipple measuring 8 x 9 x 9 mm. Ultrasound of the left axilla demonstrates no abnormal appearing lymph nodes.  IMPRESSION: Suspicious 8 x 9 x 9 mm irregular bordered hypoechoic mass at the 11:30 position of the left breast 8 cm from the nipple.  RECOMMENDATION: Recommend ultrasound-guided core needle biopsy of the suspicious mass.  I have discussed the findings and recommendations with the patient. Results were also provided in writing at the conclusion of the visit. If applicable, a reminder letter will be sent to the patient regarding the next appointment.  BI-RADS CATEGORY  5: Highly suggestive of malignancy - appropriate action should be taken.  Biopsy will be done today.   Electronically Signed   By: Elberta Fortis M.D.   On: 10-22-2013 16:33   Mm Digital Diag Ltd L  Oct 22, 2013   CLINICAL DATA:  Patient presents for additional views of the left breast as followup to a recent screening exam suggesting a possible mass.  EXAM: DIGITAL DIAGNOSTIC UNILATERAL LEFT MAMMOGRAM LIMITED; ULTRASOUND LEFT BREAST  COMPARISON:  09/01/2012, 09/27/2011, 07/19/2010, 05/26/2009, 04/01/2008 and 02/05/2007  ACR Breast Density Category b: There are scattered areas of fibroglandular density.  FINDINGS: Spot compression views demonstrate a spiculated 1 cm mass over the slightly inner upper left breast.  Ultrasound is performed, showing an irregular bordered hypoechoic solid mass with minimal shadowing at the 11:30 position of the left breast 8 cm from the nipple measuring 8 x 9 x 9 mm. Ultrasound of the left axilla demonstrates no abnormal appearing lymph nodes.  IMPRESSION: Suspicious 8 x 9 x 9 mm irregular bordered hypoechoic mass at the 11:30 position of the left breast 8 cm from the nipple.  RECOMMENDATION: Recommend ultrasound-guided core needle biopsy of the suspicious mass.  I have discussed the findings and  recommendations with the patient. Results were also provided in writing at the conclusion of the visit. If applicable, a reminder letter will be sent to the patient regarding the next appointment.  BI-RADS CATEGORY  5: Highly suggestive of malignancy - appropriate action should be  taken.  Biopsy will be done today.   Electronically Signed   By: Marin Olp M.D.   On: 10/11/2013 16:33   Mm Digital Diagnostic Unilat L  10/11/2013   CLINICAL DATA:  The patient is post ultrasound-guided core biopsy of a 9 mm spiculated mass over the 11:30 position of the left breast.  EXAM: DIGITAL DIAGNOSTIC UNILATERAL LEFT MAMMOGRAM  COMPARISON:  Previous exams  FINDINGS: Mammographic images were obtained following ultrasound guided biopsy of an irregular 9 mm mass of the 11:30 position of the left breast. Exam demonstrates satisfactory placement of a cylindrical shaped metallic clip over the biopsied mass in the upper inner left breast.  IMPRESSION: Satisfactory clip placement post ultrasound core biopsy left breast.  Final Assessment: Post Procedure Mammograms for Marker Placement   Electronically Signed   By: Marin Olp M.D.   On: 10/11/2013 17:04   Mm Radiologist Eval And Mgmt  10/12/2013   EXAM: ESTABLISHED PATIENT OFFICE VISIT -LEVEL II ([id number])  HISTORY OF PRESENT ILLNESS: Screening detected left breast mass. Ultrasound-guided core needle biopsy was performed yesterday. Pathology returned as grade 2-3 invasive ductal carcinoma.  Patient denies significant pain or bleeding at the biopsy site.  CHIEF COMPLAINT: Patient returns 1 day post ultrasound-guided core needle biopsy of a screening detected 0.9 cm mass in the upper left breast.  PHYSICAL EXAMINATION: Biopsy site in the upper left breast is clean without evidence of bleeding from the incision. The breast is soft on palpation and there is no evidence of hematoma.  ASSESSMENT AND PLAN: 0.9 cm mass in the upper left breast, pathology from ultrasound-guided core  needle biopsy revealing grade 2-3 invasive ductal carcinoma, concordant with imaging findings.  Patient is scheduled for the Multidisciplinary Breast Cancer Clinic at the [facility] on Wednesday, October 22.   Electronically Signed   By: Evangeline Dakin M.D.   On: 10/12/2013 17:08   [place] Lt Breast Bx W Loc Dev 1st Lesion Img Bx Spec US Guide  10/11/2013   CLINICAL DATA:  Patient presents for ultrasound-guided core needle biopsy of a 9 mm suspicious mass at the 11:30 position of the left breast 8 cm from the nipple.  EXAM: RADIOLOGY EXAMINATION  COMPARISON:  Previous exams.  PROCEDURE: I met with the patient and we discussed the procedure of ultrasound-guided biopsy, including benefits and alternatives. We discussed the high likelihood of a successful procedure. We discussed the risks of the procedure including infection, bleeding, tissue injury, clip migration, and inadequate sampling. Informed written consent was given.  Using sterile technique and 2% Lidocaine as local anesthetic, under direct ultrasound visualization, a 12 gauge vacuum-assisteddevice was used to perform biopsy of the targeted mass at the 11:30 position using a lateral to medial approach. At the conclusion of the procedure, a top hat shaped tissue marker clip was deployed into the biopsy cavity. Follow-up 2-view mammogram was performed and dictated separately.  The usual time-out protocol was performed immediately prior to the procedure.  IMPRESSION: Ultrasound-guided biopsy of a suspicious left breast mass. No apparent complications.   Electronically Signed   By: Marin Olp M.D.   On: 10/11/2013 16:41     LABS:    Chemistry      Component Value Date/Time   NA 140 03/08/2014 0840   NA 140 04/18/2011 1136   K 4.3 03/08/2014 0840   K 3.8 04/18/2011 1136   CL 103 04/18/2011 1136   CO2 25 03/08/2014 0840   CO2 28 04/18/2011  1136   BUN 13.9 03/08/2014 0840   BUN 13 04/18/2011 1136   CREATININE 0.7  03/08/2014 0840   CREATININE 0.6 04/18/2011 1136      Component Value Date/Time   CALCIUM 9.7 03/08/2014 0840   CALCIUM 9.3 04/18/2011 1136   ALKPHOS 43 03/08/2014 0840   AST 15 03/08/2014 0840   ALT 13 03/08/2014 0840   BILITOT 0.72 03/08/2014 0840      Lab Results  Component Value Date   WBC 7.7 03/08/2014   HGB 10.5* 03/08/2014   HCT 33.2* 03/08/2014   MCV 87.1 03/08/2014   PLT 274 03/08/2014   PATHOLOGY: ADDITIONAL INFORMATION: 1. A sample (block 1B) was sent to [facility] for Oncotype testing. The patient's recurrence score is 29. Those patients who had a recurrence score of 29 had an average rate of distant recurrence of 17%. Enid Cutter MD Pathologist, Electronic Signature ( Signed 11/22/2013) 1. CHROMOGENIC IN-SITU HYBRIDIZATION Results: HER-2/NEU BY CISH - NO AMPLIFICATION OF HER-2 DETECTED. RESULT RATIO OF HER2: CEP 17 SIGNALS 0.95 AVERAGE HER2 COPY NUMBER PER CELL 1.95 REFERENCE RANGE NEGATIVE HER2/Chr17 Ratio <2.0 and Average HER2 copy number <4.0 EQUIVOCAL HER2/Chr17 Ratio <2.0 and Average HER2 copy number 4.0 and <6.0 POSITIVE HER2/Chr17 Ratio >=2.0 and/or Average HER2 copy number >=6.0 Enid Cutter MD Pathologist, Electronic Signature ( Signed 11/04/2013) FINAL DIAGNOSIS Diagnosis 1. Breast, lumpectomy, Left - INVASIVE DUCTAL CARCINOMA, SEE COMMENT. 1 of 4 FINAL for NADA, GODLEY [phone]) Diagnosis(continued) - LYMPHOVASCULAR INVASION IDENTIFIED. - INVASIVE TUMOR IS 1 MM FROM NEAREST MARGIN (MEDIAL). - PREVIOUS BIOPSY SITE IDENTIFIED. - SEE TUMOR TEMPLATE BELOW. 2. Breast, excision, left - BENIGN BREAST TISSUE, SEE COMMENT. - NEGATIVE FOR ATYPIA OR MALIGNANCY. - SURGICAL MARGINS, NEGATIVE FOR ATYPIA OR MALIGNANCY. 3. Lymph node, sentinel, biopsy, #1 left Axilla - ONE LYMPH NODE, POSITIVE FOR METASTATIC MAMMARY CARCINOMA (1/1). - TUMOR DEPOSIT IS 1 MM. - NO EXTRACAPSULAR TUMOR EXTENSION IDENTIFIED. 4. Lymph nodes, regional resection, left  Axilla - THREE LYMPH NODES, NEGATIVE FOR TUMOR (0/3). 5. Lymph node, sentinel, biopsy, #2 left - ONE LYMPH NODE, NEGATIVE FOR TUMOR (0/1). 6. Lymph node, sentinel, biopsy, #3 left - ONE LYMPH NODE, NEGATIVE FOR TUMOR (0/1). 7. Lymph node, sentinel, biopsy, #4 left - ONE LYMPH NODE, NEGATIVE FOR TUMOR (0/1). Microscopic Comment 1. BREAST, INVASIVE TUMOR, WITH LYMPH NODE SAMPLING Specimen, including laterality and lymph node sampling (sentinel, non-sentinel): Left breast plus sentinel lymph nodes Procedure: Lumpectomy Histologic type: Ductal Grade: II of III Tubule formation: II Nuclear pleomorphism: II Mitotic:II Tumor size (gross measurement): 0.9 cm Margins: Invasive, distance to closest margin: 1 mm In-situ, distance to closest margin: N/A If margin positive, focally or broadly: N/A Lymphovascular invasion: Present Ductal carcinoma in situ: Absent Grade: N/A Extensive intraductal component: N/A Lobular neoplasia: Absent Tumor focality: Unifocal Treatment effect: None If present, treatment effect in breast tissue, lymph nodes or both: N/A Extent of tumor: Skin: N/A Nipple: N/A Skeletal muscle: N/A Lymph nodes: Examined: 4 Sentinel 3 Non-sentinel 7 Total 2 of 4 FINAL for LAMIJA, BESSE [phone]) Microscopic Comment(continued) Lymph nodes with metastasis: 1 Isolated tumor cells (< 0.2 mm): N/A Micrometastasis: (> 0.2 mm and < 2.0 mm): 1 = 1 mm Macrometastasis: (> 2.0 mm): N/A Extracapsular extension: Absent Breast prognostic profile: Estrogen receptor: Not repeated, previous study demonstrated 100% positivity ([id number]) Progesterone receptor: Not repeated, previous study demonstrated 100% positivity ([id number]) Her 2 neu: Repeated, previous study demonstrated no amplification (1.85) ([id number]) Ki-67: Not repeated, previous study demonstrated 46% proliferative rate ([id number])  Non-neoplastic breast: Previous biopsy site TNM: pT1b, pN1, pMX Comments:  None 2. There is no mass grossly identified. Representative section, including margins, demonstrate a nonneoplastic to include fibrocystic change, pseudoangiomatous stromal hyperplasia, and usual ductal hyperplasia. There are no atypical or malignant epithelial or stromal features present. Mali RUND DO Pathologist, Electronic Signature (Case signed 11/02/2013) Intraoperative Diagnosis RAPID INTRAOPERAS  ASSESSMENT/PLAN:    53 year old female with  #1 a screen detected left breast mass measuring 9 mm by ultrasound at the 11:30 o'clock position 8 cm from the nipple. She did not get an MRI. 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   #2 final pathology revealed grade 2, 0.9 cm invasive ductal carcinoma one sentinel node was positive for metastatic disease. Tumor ER positive PR positive HER-2/neu negative with an elevated Ki-67.  #3 Oncotype DX testing recurrence score 29 giving her a 17% risk of distant recurrence at 5 years with tamoxifen alone. Pt is s/p 4 cycles of Taxotere/Cytoxan that completed on 02/08/14  #5 Bilateral lower extremity swelling.  We discussed this for a great deal today.  Her protein level and albumin is decreased and she has been eating foods that are high in sodium.  I recommended she increase her protein intake and decrease her sodium intake and gave her food examples in her AVS.  Also I recommended she get support hose/stockings from a nursing store that wouldn't be as tight as the compression stockings, but may help with her swelling.  I recommended she elevate her legs as frequently as possible throughout the day at work and gave her a note to this effect.    #6  Patient will return to clinic as scheduled.  Should she continue to have problems with swelling after trying the suggestions above she will call us.     All questions  were answered. The patient knows to call the clinic with any problems, questions or concerns. We can certainly see the patient much sooner if necessary.  I spent 25 minutes counseling the patient face to face. The total time spent in the appointment was 30 minutes.  Minette Headland, [place] [phone]

## 2014-03-08 NOTE — Patient Instructions (Signed)
High Protein Diet A high protein diet means that high protein foods are added to your diet. Getting more protein in the diet is important for a number of reasons. Protein helps the body to build tissue, muscle, and to repair damage. People who have had surgery, injuries such as broken bones, infections, and burns, or illnesses such as cancer, may need more protein in their diet.  SERVING SIZES Measuring foods and serving sizes helps to make sure you are getting the right amount of food. The list below tells how big or small some common serving sizes are.   1 oz.........4 stacked dice.  3 oz........Marland KitchenDeck of cards.  1 tsp.......Marland KitchenTip of little finger.  1 tbs......Marland KitchenMarland KitchenThumb.  2 tbs.......Marland KitchenGolf ball.   cup......Marland KitchenHalf of a fist.  1 cup.......Marland KitchenA fist. FOOD SOURCES OF PROTEIN Listed below are some food sources of protein and the amount of protein they contain. Your Registered Dietitian can calculate how many grams of protein you need for your medical condition. High protein foods can be added to the diet at mealtime or as snacks. Be sure to have at least 1 protein-containing food at each meal and snack to ensure adequate intake.  Meats and Meat Substitutes / Protein (g)  3 oz poultry (chicken, [place]) / 26 g  3 oz tuna, canned in water / 26 g  3 oz fish (cod) / 21 g  3 oz red meat (beef, pork) / 21 g  4 oz tofu / 9 g  1 egg / 6 g   cup egg substitute / 5 g  1 cup dried beans / 15 g  1 cup soy milk / 4 g Dairy / Protein (g)  1 cup milk (skim, 1%, 2%, whole) / 8 g   cup evaporated milk / 9 g  1 cup buttermilk / 8 g  1 cup low-fat plain yogurt / 11 g  1 cup regular plain yogurt / 9 g   cup cottage cheese / 14 g  1 oz cheddar cheese / 7 g Nuts / Protein (g)  2 tbs peanut butter / 8 g  1 oz peanuts / 7 g  2 tbs cashews / 5 g  2 tbs almonds / 5 g Document Released: 12/16/2005 Document Revised: 03/09/2012 Document Reviewed: 09/18/2007 ExitCare Patient Information  2014 [employer].  2 Gram Low Sodium Diet A 2 gram sodium diet restricts the amount of sodium in the diet to no more than 2 g or 2000 mg daily. Limiting the amount of sodium is often used to help lower blood pressure. It is important if you have heart, liver, or kidney problems. Many foods contain sodium for flavor and sometimes as a preservative. When the amount of sodium in a diet needs to be low, it is important to know what to look for when choosing foods and drinks. The following includes some information and guidelines to help make it easier for you to adapt to a low sodium diet. QUICK TIPS  Do not add salt to food.  Avoid convenience items and fast food.  Choose unsalted snack foods.  Buy lower sodium products, often labeled as "lower sodium" or "no salt added."  Check food labels to learn how much sodium is in 1 serving.  When eating at a restaurant, ask that your food be prepared with less salt or none, if possible. READING FOOD LABELS FOR SODIUM INFORMATION The nutrition facts label is a good place to find how much sodium is in foods. Look for products with  no more than 500 to 600 mg of sodium per meal and no more than 150 mg per serving. Remember that 2 g = 2000 mg. The food label may also list foods as:  Sodium-free: Less than 5 mg in a serving.  Very low sodium: 35 mg or less in a serving.  Low-sodium: 140 mg or less in a serving.  Light in sodium: 50% less sodium in a serving. For example, if a food that usually has 300 mg of sodium is changed to become light in sodium, it will have 150 mg of sodium.  Reduced sodium: 25% less sodium in a serving. For example, if a food that usually has 400 mg of sodium is changed to reduced sodium, it will have 300 mg of sodium. CHOOSING FOODS Grains  Avoid: Salted crackers and snack items. Some cereals, including instant hot cereals. Bread stuffing and biscuit mixes. Seasoned rice or pasta mixes.  Choose: Unsalted snack items.  Low-sodium cereals, oats, puffed wheat and rice, shredded wheat. English muffins and bread. Pasta. Meats  Avoid: Salted, canned, smoked, spiced, pickled meats, including fish and poultry. Bacon, ham, sausage, cold cuts, hot dogs, anchovies.  Choose: Low-sodium canned tuna and salmon. Fresh or frozen meat, poultry, and fish. Dairy  Avoid: Processed cheese and spreads. Cottage cheese. Buttermilk and condensed milk. Regular cheese.  Choose: Milk. Low-sodium cottage cheese. Yogurt. Sour cream. Low-sodium cheese. Fruits and Vegetables  Avoid: Regular canned vegetables. Regular canned tomato sauce and paste. Frozen vegetables in sauces. Olives. Angie Fava. Relishes. Sauerkraut.  Choose: Low-sodium canned vegetables. Low-sodium tomato sauce and paste. Frozen or fresh vegetables. Fresh and frozen fruit. Condiments  Avoid: Canned and packaged gravies. Worcestershire sauce. Tartar sauce. Barbecue sauce. Soy sauce. Steak sauce. Ketchup. Onion, garlic, and table salt. Meat flavorings and tenderizers.  Choose: Fresh and dried herbs and spices. Low-sodium varieties of mustard and ketchup. Lemon juice. Tabasco sauce. Horseradish. SAMPLE 2 GRAM SODIUM MEAL PLAN Breakfast / Sodium (mg)  1 cup low-fat milk / 161 mg  2 slices whole-wheat toast / 270 mg  1 tbs heart-healthy margarine / 153 mg  1 hard-boiled egg / 139 mg  1 small orange / 0 mg Lunch / Sodium (mg)  1 cup raw carrots / 76 mg   cup hummus / 298 mg  1 cup low-fat milk / 143 mg   cup red grapes / 2 mg  1 whole-wheat pita bread / 356 mg Dinner / Sodium (mg)  1 cup whole-wheat pasta / 2 mg  1 cup low-sodium tomato sauce / 73 mg  3 oz lean ground beef / 57 mg  1 small side salad (1 cup raw spinach leaves,  cup cucumber,  cup yellow bell pepper) with 1 tsp olive oil and 1 tsp red wine vinegar / 25 mg Snack / Sodium (mg)  1 container low-fat vanilla yogurt / 107 mg  3 graham cracker squares / 127 mg Nutrient  Analysis  Calories: 2033  Protein: 77 g  Carbohydrate: 282 g  Fat: 72 g  Sodium: 1971 mg Document Released: 12/16/2005 Document Revised: 03/09/2012 Document Reviewed: 03/19/2010 ExitCare Patient Information 2014 [place].

## 2014-03-08 NOTE — Discharge Instructions (Signed)
Incision Care °An incision is when a surgeon cuts into your body tissues. After surgery, the incision needs to be cared for properly to prevent infection.  °HOME CARE INSTRUCTIONS  °· Take all medicine as directed by your caregiver. Only take over-the-counter or prescription medicines for pain, discomfort, or fever as directed by your caregiver. °· Do not remove your bandage (dressing) or get your incision wet until your surgeon gives you permission. In the event that your dressing becomes wet, dirty, or starts to smell, change the dressing and call your surgeon for instructions as soon as possible. °· Take showers. Do not take tub baths, swim, or do anything that may soak the wound until it is healed. °· Resume your normal diet and activities as directed or allowed. °· Avoid lifting any weight until you are instructed otherwise. °· Use anti-itch antihistamine medicine as directed by your caregiver. The wound may itch when it is healing. Do not pick or scratch at the wound. °· Follow up with your caregiver for stitch (suture) or staple removal as directed. °· Drink enough fluids to keep your urine clear or pale yellow. °SEEK MEDICAL CARE IF:  °· You have redness, swelling, or increasing pain in the wound that is not controlled with medicine. °· You have drainage, blood, or pus coming from the wound that lasts longer than 1 day. °· You develop muscle aches, chills, or a general ill feeling. °· You notice a bad smell coming from the wound or dressing. °· Your wound edges separate after the sutures, staples, or skin adhesive strips have been removed. °· You develop persistent nausea or vomiting. °SEEK IMMEDIATE MEDICAL CARE IF:  °· You have a fever. °· You develop a rash. °· You develop dizzy episodes or faint while standing. °· You have difficulty breathing. °· You develop any reaction or side effects to medicine given. °MAKE SURE YOU:  °· Understand these instructions. °· Will watch your condition. °· Will get help  right away if you are not doing well or get worse. °Document Released: 07/05/2005 Document Revised: 03/09/2012 Document Reviewed: 04/21/2011 °ExitCare® Patient Information ©2014 [employer]. °Moderate Sedation, Adult °Moderate sedation is given to help you relax or even sleep through a procedure. You may remain sleepy, be clumsy, or have poor balance for several hours following this procedure. Arrange for a responsible adult, family member, or friend to take you home. A responsible adult should stay with you for at least 24 hours or until the medicines have worn off. °· Do not participate in any activities where you could become injured for the next 24 hours, or until you feel normal again. Do not: °· Drive. °· Swim. °· Ride a bicycle. °· Operate heavy machinery. °· Cook. °· Use power tools. °· Climb ladders. °· Work at heights. °· Do not make important decisions or sign legal documents until you are improved. °· Vomiting may occur if you eat too soon. When you can drink without vomiting, try water, juice, or soup. Try solid foods if you feel little or no nausea. °· Only take over-the-counter or prescription medications for pain, discomfort, or fever as directed by your caregiver.If pain medications have been prescribed for you, ask your caregiver how soon it is safe to take them. °· Make sure you and your family fully understands everything about the medication given to you. Make sure you understand what side effects may occur. °· You should not drink alcohol, take sleeping pills, or medications that cause drowsiness for at least   24 hours. °· If you smoke, do not smoke alone. °· If you are feeling better, you may resume normal activities 24 hours after receiving sedation. °· Keep all appointments as scheduled. Follow all instructions. °· Ask questions if you do not understand. °SEEK MEDICAL CARE IF:  °· Your skin is pale or bluish in color. °· You continue to feel sick to your stomach (nauseous) or throw up  (vomit). °· Your pain is getting worse and not helped by medication. °· You have bleeding or swelling. °· You are still sleepy or feeling clumsy after 24 hours. °SEEK IMMEDIATE MEDICAL CARE IF:  °· You develop a rash. °· You have difficulty breathing. °· You develop any type of allergic problem. °· You have a fever. °Document Released: 09/10/2001 Document Revised: 03/09/2012 Document Reviewed: 08/23/2013 °ExitCare® Patient Information ©2014 [employer]. ° °

## 2014-03-08 NOTE — Procedures (Signed)
Successful RT IJ POWER PORT REMOVAL No comp Stable Full report in pacs

## 2014-03-22 ENCOUNTER — Ambulatory Visit
Admission: RE | Admit: 2014-03-22 | Discharge: 2014-03-22 | Disposition: A | Discharge status: Home / Self Care | Payer: 59 | Source: Ambulatory Visit | Attending: Radiation Oncology | Admitting: Radiation Oncology

## 2014-03-22 DIAGNOSIS — L299 Pruritus, unspecified: Secondary | ICD-10-CM | POA: Insufficient documentation

## 2014-03-22 DIAGNOSIS — Z510 Encounter for antineoplastic radiation therapy: Secondary | ICD-10-CM | POA: Insufficient documentation

## 2014-03-22 DIAGNOSIS — C50219 Malignant neoplasm of upper-inner quadrant of unspecified female breast: Secondary | ICD-10-CM | POA: Insufficient documentation

## 2014-03-22 DIAGNOSIS — C50212 Malignant neoplasm of upper-inner quadrant of left female breast: Secondary | ICD-10-CM

## 2014-03-22 NOTE — Progress Notes (Addendum)
Name: Veronica Rogers   MRN: [id number]  Date:  03/22/14  DOB: [birth year]  Status:outpatient   DIAGNOSIS: Left Breast cancer.  CONSENT VERIFIED: yes SET UP: Patient is setup supine  IMMOBILIZATION:  The following immobilization was used:Custom Moldable Pillow, breast board.  NARRATIVE: [name] was brought to the [facility].  Identity was confirmed.  All relevant records and images related to the planned course of therapy were reviewed.  Then, the patient was positioned in a stable reproducible clinical set-up for radiation therapy.  Wires were placed to delineate the clinical extent of breast tissue. A wire was placed on the scar as well.  CT images were obtained.  An isocenter was placed. Skin markings were placed.  The position of the heart was then analyzed.  Due to the proximity of the heart to the chest wall, I felt she would benefit from deep inspiration breath hold for cardiac sparing.  She was then coached and rescanned in the breath hold position.  Acceptable cardiac sparing was achieved. The CT images were loaded into the planning software where the target and avoidance structures were contoured.  The radiation prescription was entered and confirmed. The patient was discharged in stable condition and tolerated simulation well.    TREATMENT PLANNING NOTE/3D Simulation Note Treatment planning then occurred. I have requested : MLC's, isodose plan, basic dose calculation  3D simulation was performed.  I personally designed and supervised the construction of 3 medically necessary complex treatment devices in the form of MLCs which will be used for beam modification and to protect critical structures including the heart and lung as well as the immobilization device which is necessary for reproducible set up.  I have requested a dose volume histogram of the heart, lung and tumor cavity.    RESPIRATORY MOTION MANAGEMENT SIMULATION - Deep Inspiration Breath  Hold  NARRATIVE:  In order to account for effect of respiratory motion on target structures and other organs in the planning and delivery of radiotherapy, this patient underwent respiratory motion management simulation.  To accomplish this, when the patient was brought to the CT simulation planning suite, a bellows was placed on the her abdomen.  Wave forms of the patient's breathing were obtained. Coaching was performed and practice sessions initiated to monitor her ability to obtain and maintain deep inspiration breath hold.  The CT images were loaded into the planning software and fused with her free breathing images by physics.  Acceptable cardiac sparing was achieved through the use of deep inspiration breath hold.  Planning will be performed on her breath hold scan

## 2014-03-25 ENCOUNTER — Ambulatory Visit
Admission: RE | Admit: 2014-03-25 | Discharge: 2014-03-25 | Disposition: A | Discharge status: Home / Self Care | Payer: 59 | Source: Ambulatory Visit | Attending: Radiation Oncology | Admitting: Radiation Oncology

## 2014-03-25 DIAGNOSIS — C50212 Malignant neoplasm of upper-inner quadrant of left female breast: Secondary | ICD-10-CM

## 2014-03-26 NOTE — Progress Notes (Signed)
  Radiation Oncology         [phone] ________________________________  Name: Veronica Rogers MRN: [id number]  Date: 03/25/2014  DOB: 10-16-1961  Simulation Verification Note   NARRATIVE: The patient was brought to the treatment unit and placed in the planned treatment position. The clinical setup was verified. Then port films were obtained and uploaded to the radiation oncology medical record software.  The treatment beams were carefully compared against the planned radiation fields. The position, location, and shape of the radiation fields was reviewed. The targeted volume of tissue appears to be appropriately covered by the radiation beams. Based on my personal review, I approved the simulation verification. The patient's treatment will proceed as planned.  ________________________________   Jodelle Gross, MD, PhD

## 2014-03-28 ENCOUNTER — Ambulatory Visit
Admission: RE | Admit: 2014-03-28 | Discharge: 2014-03-28 | Disposition: A | Discharge status: Home / Self Care | Payer: 59 | Source: Ambulatory Visit | Attending: Radiation Oncology | Admitting: Radiation Oncology

## 2014-03-28 DIAGNOSIS — C50212 Malignant neoplasm of upper-inner quadrant of left female breast: Secondary | ICD-10-CM

## 2014-03-28 MED ORDER — RADIAPLEXRX EX GEL
Freq: Once | CUTANEOUS | Status: AC
  Administered 2014-03-28: 17:00:00 via TOPICAL

## 2014-03-28 MED ORDER — ALRA NON-METALLIC DEODORANT (RAD-ONC)
1.0000 "application " | Freq: Once | TOPICAL | Status: AC
  Administered 2014-03-28: 1 via TOPICAL

## 2014-03-28 NOTE — Progress Notes (Signed)
Routine of clinic reviewed.Patient education provided using teach back method.Given Radiation Therapy and You Booklet, radiaplex and alra deodorant and skin care sheet.Patient able to verbalize at least 2 side effects of treatment related to skin care and fatigue including skin discoloration, tenderness and swelling and when to apply radiaplex to skin.

## 2014-03-29 ENCOUNTER — Ambulatory Visit
Admission: RE | Admit: 2014-03-29 | Discharge: 2014-03-29 | Disposition: A | Discharge status: Home / Self Care | Payer: 59 | Source: Ambulatory Visit | Attending: Radiation Oncology | Admitting: Radiation Oncology

## 2014-03-29 VITALS — BP 127/78 | HR 86 | Temp 98.6°F | Wt 197.1 lb

## 2014-03-29 DIAGNOSIS — C50212 Malignant neoplasm of upper-inner quadrant of left female breast: Secondary | ICD-10-CM

## 2014-03-29 NOTE — Progress Notes (Signed)
Patient for weekly assessment of radiation to left breast.Completed 2 treatments.Medication list cleaned up.

## 2014-03-29 NOTE — Progress Notes (Signed)
Weekly Management Note Current Dose:  4 Gy  Projected Dose: 50  Gy   Narrative:  The patient presents for routine under treatment assessment.  CBCT/MVCT images/Port film x-rays were reviewed.  The chart was checked. Doing well. No complaints. RN education performed yesterday.   Physical Findings: Weight: 197 lb 1.6 oz (89.404 kg). Unchanged  Impression:  The patient is tolerating radiation.  Plan:  Continue treatment as planned. Continue radiaplex.

## 2014-03-30 ENCOUNTER — Ambulatory Visit: Payer: 59

## 2014-03-30 ENCOUNTER — Ambulatory Visit
Admission: RE | Admit: 2014-03-30 | Discharge: 2014-03-30 | Disposition: A | Discharge status: Home / Self Care | Payer: 59 | Source: Ambulatory Visit | Attending: Radiation Oncology | Admitting: Radiation Oncology

## 2014-03-31 ENCOUNTER — Ambulatory Visit: Payer: 59

## 2014-03-31 ENCOUNTER — Ambulatory Visit
Admission: RE | Admit: 2014-03-31 | Discharge: 2014-03-31 | Disposition: A | Discharge status: Home / Self Care | Payer: 59 | Source: Ambulatory Visit | Attending: Radiation Oncology | Admitting: Radiation Oncology

## 2014-04-01 ENCOUNTER — Ambulatory Visit
Admission: RE | Admit: 2014-04-01 | Discharge: 2014-04-01 | Disposition: A | Discharge status: Home / Self Care | Payer: 59 | Source: Ambulatory Visit | Attending: Radiation Oncology | Admitting: Radiation Oncology

## 2014-04-04 ENCOUNTER — Ambulatory Visit
Admission: RE | Admit: 2014-04-04 | Discharge: 2014-04-04 | Disposition: A | Discharge status: Home / Self Care | Payer: 59 | Source: Ambulatory Visit | Attending: Radiation Oncology | Admitting: Radiation Oncology

## 2014-04-05 ENCOUNTER — Ambulatory Visit
Admission: RE | Admit: 2014-04-05 | Discharge: 2014-04-05 | Disposition: A | Discharge status: Home / Self Care | Payer: 59 | Source: Ambulatory Visit | Attending: Radiation Oncology | Admitting: Radiation Oncology

## 2014-04-05 ENCOUNTER — Encounter: Payer: Self-pay | Admitting: Radiation Oncology

## 2014-04-05 VITALS — BP 123/78 | HR 86 | Temp 98.1°F | Resp 20 | Wt 199.3 lb

## 2014-04-05 DIAGNOSIS — C50212 Malignant neoplasm of upper-inner quadrant of left female breast: Secondary | ICD-10-CM

## 2014-04-05 NOTE — Progress Notes (Signed)
Weekly Management Note Current Dose: 14  Gy  Projected Dose: 60 Gy   Narrative:  The patient presents for routine under treatment assessment.  CBCT/MVCT images/Port film x-rays were reviewed.  The chart was checked. Doing well. No complaints.   Physical Findings: Weight: 199 lb 4.8 oz (90.402 kg). Unchanged  Impression:  The patient is tolerating radiation.  Plan:  Continue treatment as planned. Continue radiaplex.

## 2014-04-05 NOTE — Progress Notes (Signed)
Weekly rad txs, lt breast 7 completed, no skin changes as yet, using radiaplex bid, no pain, good appetite,  5:04 PM

## 2014-04-06 ENCOUNTER — Ambulatory Visit
Admission: RE | Admit: 2014-04-06 | Discharge: 2014-04-06 | Disposition: A | Discharge status: Home / Self Care | Payer: 59 | Source: Ambulatory Visit | Attending: Radiation Oncology | Admitting: Radiation Oncology

## 2014-04-07 ENCOUNTER — Ambulatory Visit
Admission: RE | Admit: 2014-04-07 | Discharge: 2014-04-07 | Disposition: A | Discharge status: Home / Self Care | Payer: 59 | Source: Ambulatory Visit | Attending: Radiation Oncology | Admitting: Radiation Oncology

## 2014-04-07 ENCOUNTER — Ambulatory Visit: Payer: 59

## 2014-04-08 ENCOUNTER — Ambulatory Visit: Payer: 59

## 2014-04-11 ENCOUNTER — Ambulatory Visit
Admission: RE | Admit: 2014-04-11 | Discharge: 2014-04-11 | Disposition: A | Discharge status: Home / Self Care | Payer: 59 | Source: Ambulatory Visit | Attending: Radiation Oncology | Admitting: Radiation Oncology

## 2014-04-11 ENCOUNTER — Ambulatory Visit: Payer: 59

## 2014-04-12 ENCOUNTER — Ambulatory Visit
Admission: RE | Admit: 2014-04-12 | Discharge: 2014-04-12 | Disposition: A | Discharge status: Home / Self Care | Payer: 59 | Source: Ambulatory Visit | Attending: Radiation Oncology | Admitting: Radiation Oncology

## 2014-04-12 DIAGNOSIS — C50212 Malignant neoplasm of upper-inner quadrant of left female breast: Secondary | ICD-10-CM

## 2014-04-12 NOTE — Progress Notes (Signed)
Weekly Management Note Current Dose: 22  Gy  Projected Dose: 50 Gy   Narrative:  The patient presents for routine under treatment assessment.  CBCT/MVCT images/Port film x-rays were reviewed.  The chart was checked. No complaints. Doing well.   Physical Findings: Weight:  . Slightly pink breast.   Impression:  The patient is tolerating radiation.  Plan:  Continue treatment as planned. Continue radiaplex.

## 2014-04-13 ENCOUNTER — Ambulatory Visit: Payer: 59

## 2014-04-14 ENCOUNTER — Ambulatory Visit
Admission: RE | Admit: 2014-04-14 | Discharge: 2014-04-14 | Disposition: A | Discharge status: Home / Self Care | Payer: 59 | Source: Ambulatory Visit | Attending: Radiation Oncology | Admitting: Radiation Oncology

## 2014-04-14 DIAGNOSIS — C50212 Malignant neoplasm of upper-inner quadrant of left female breast: Secondary | ICD-10-CM

## 2014-04-14 MED ORDER — RADIAPLEXRX EX GEL
Freq: Once | CUTANEOUS | Status: AC
  Administered 2014-04-14: 17:00:00 via TOPICAL

## 2014-04-15 ENCOUNTER — Ambulatory Visit
Admission: RE | Admit: 2014-04-15 | Discharge: 2014-04-15 | Disposition: A | Discharge status: Home / Self Care | Payer: 59 | Source: Ambulatory Visit | Attending: Radiation Oncology | Admitting: Radiation Oncology

## 2014-04-18 ENCOUNTER — Ambulatory Visit
Admission: RE | Admit: 2014-04-18 | Discharge: 2014-04-18 | Disposition: A | Discharge status: Home / Self Care | Payer: 59 | Source: Ambulatory Visit | Attending: Radiation Oncology | Admitting: Radiation Oncology

## 2014-04-19 ENCOUNTER — Ambulatory Visit
Admission: RE | Admit: 2014-04-19 | Discharge: 2014-04-19 | Disposition: A | Discharge status: Home / Self Care | Payer: 59 | Source: Ambulatory Visit | Attending: Radiation Oncology | Admitting: Radiation Oncology

## 2014-04-19 ENCOUNTER — Ambulatory Visit: Payer: 59

## 2014-04-19 DIAGNOSIS — C50212 Malignant neoplasm of upper-inner quadrant of left female breast: Secondary | ICD-10-CM

## 2014-04-19 NOTE — Progress Notes (Signed)
Weekly Management Note Current Dose:  34 Gy  Projected Dose: 60 Gy   Narrative:  The patient presents for routine under treatment assessment.  CBCT/MVCT images/Port film x-rays were reviewed.  The chart was checked. Doing well. C/o itching medially. Using radiaplex.  Physical Findings: Doing well. Itching medially.   Impression:  The patient is tolerating radiation.  Plan:  Continue treatment as planned. Continue radiaplex. Add hydrocortisone prn.

## 2014-04-20 ENCOUNTER — Ambulatory Visit
Admission: RE | Admit: 2014-04-20 | Discharge: 2014-04-20 | Disposition: A | Discharge status: Home / Self Care | Payer: 59 | Source: Ambulatory Visit | Attending: Radiation Oncology | Admitting: Radiation Oncology

## 2014-04-20 IMAGING — US US BREAST*L*
1 series · 13 of 14 positions shown · non-contrast
Comparison: 09/01/2012, 09/27/2011, 07/19/2010, 05/26/2009,
04/01/2008 and 02/05/2007

CLINICAL DATA: Patient presents for additional views of the left
breast as followup to a recent screening exam suggesting a possible
mass.

EXAM:
DIGITAL DIAGNOSTIC UNILATERAL LEFT MAMMOGRAM LIMITED; ULTRASOUND
LEFT BREAST

[Series 1: us breast*left* · 13 of 14 slices shown]
[im 1/14]
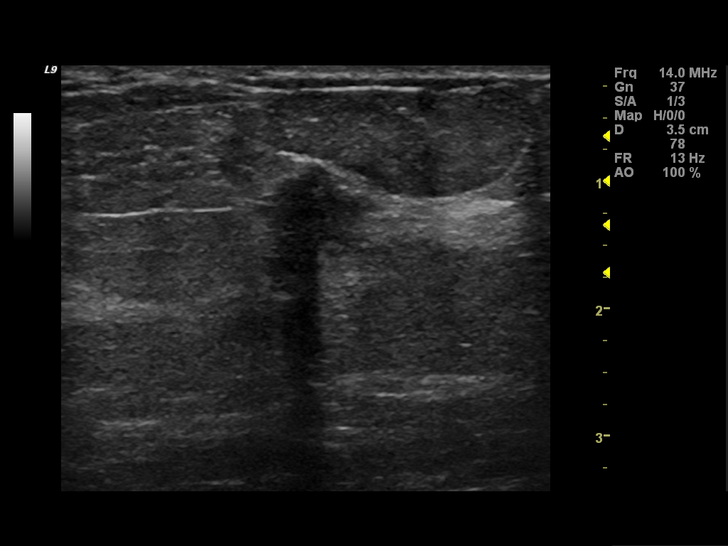
[im 2/14]
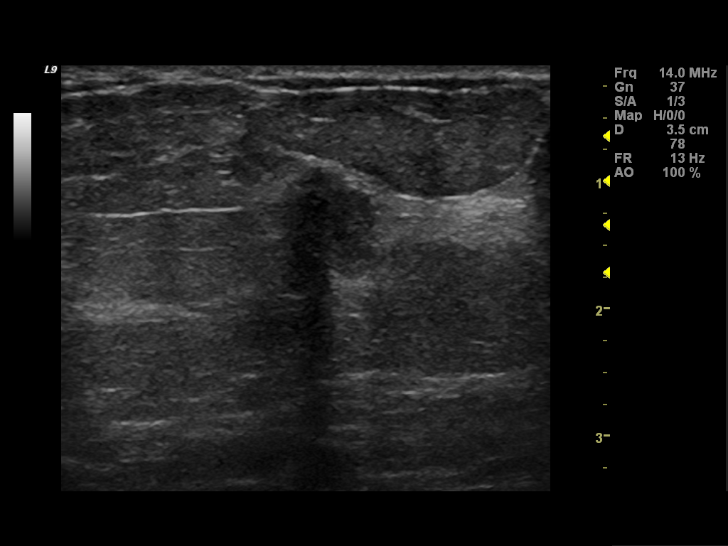
[im 3/14]
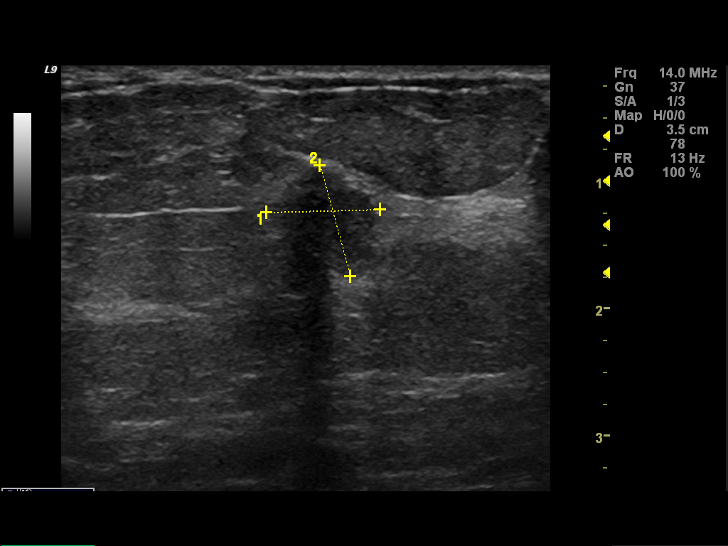
[im 4/14]
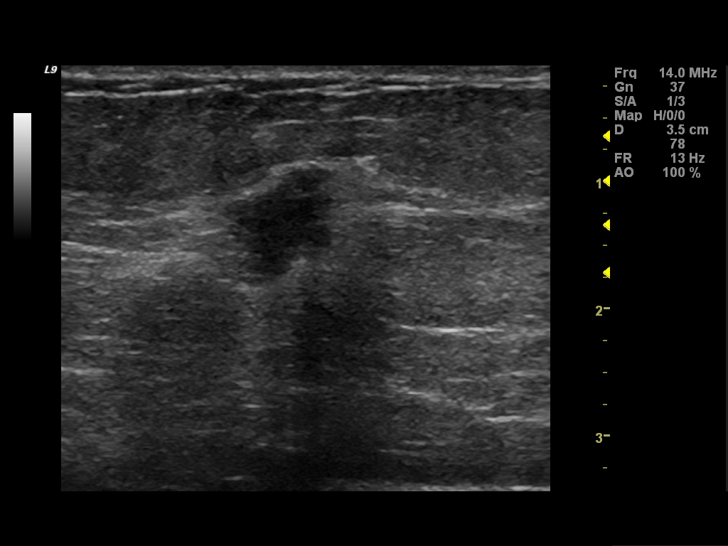
[im 5/14]
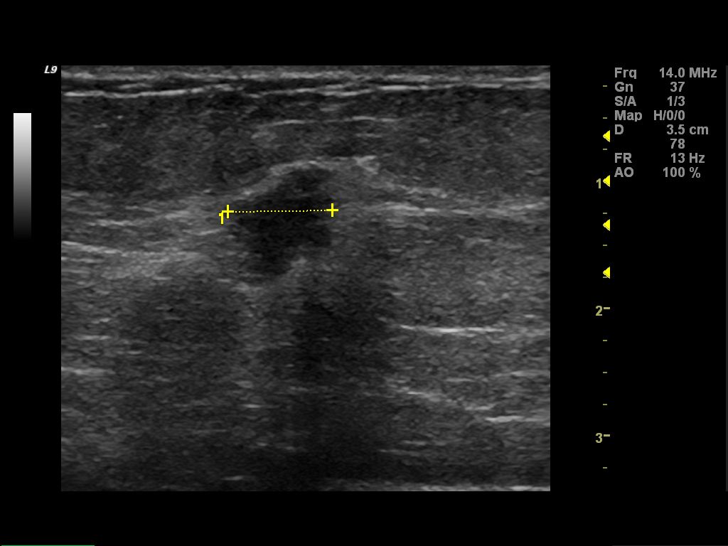
[im 6/14]
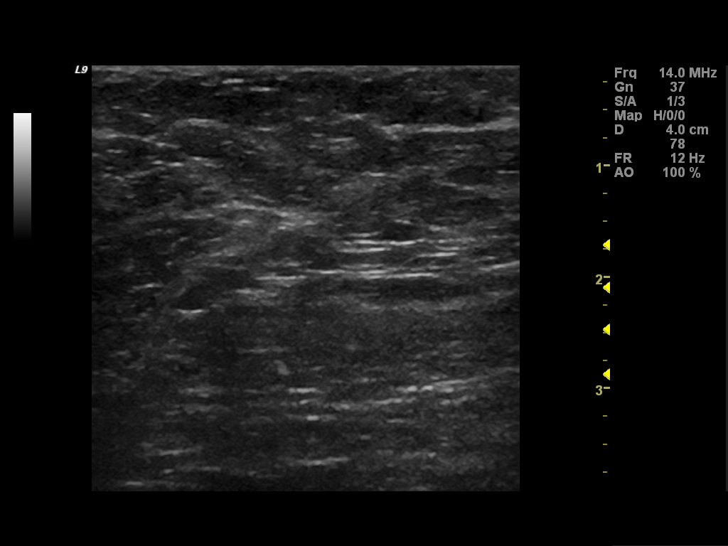
[im 8/14]
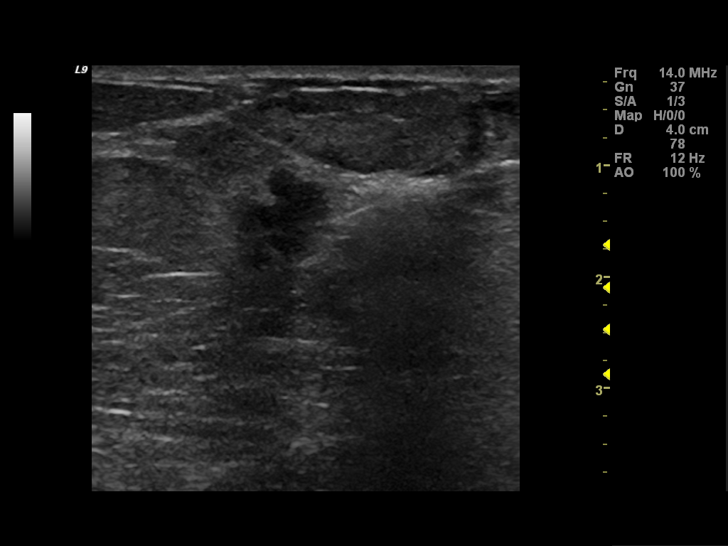
[im 9/14]
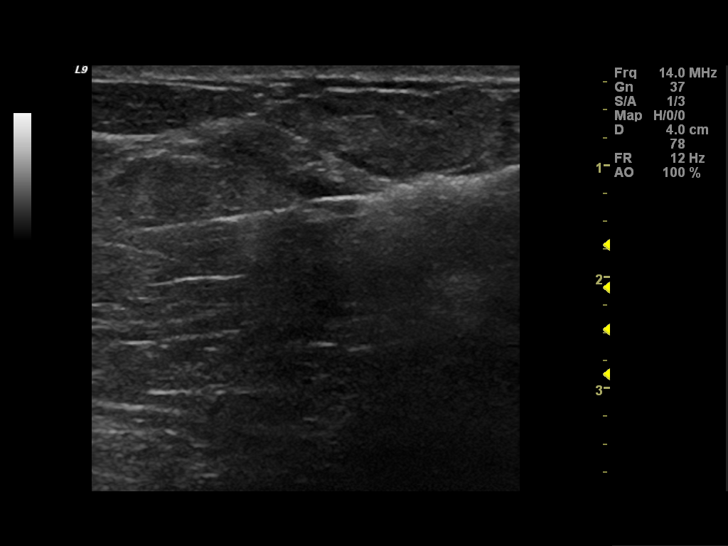
[im 10/14]
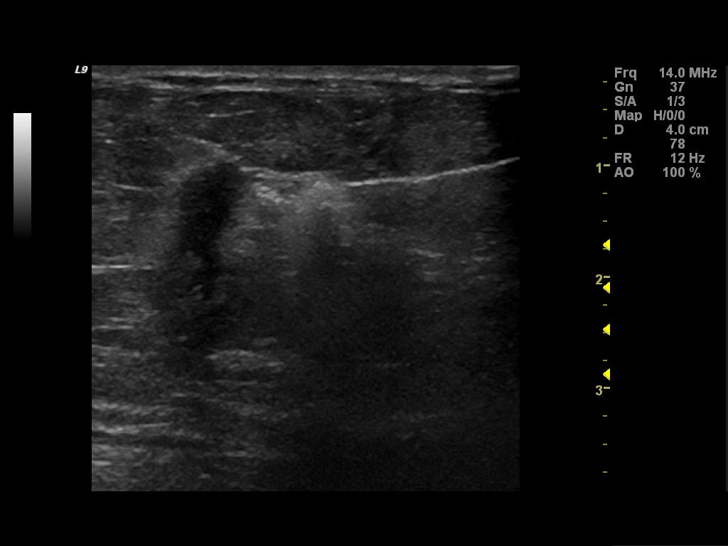
[im 11/14]
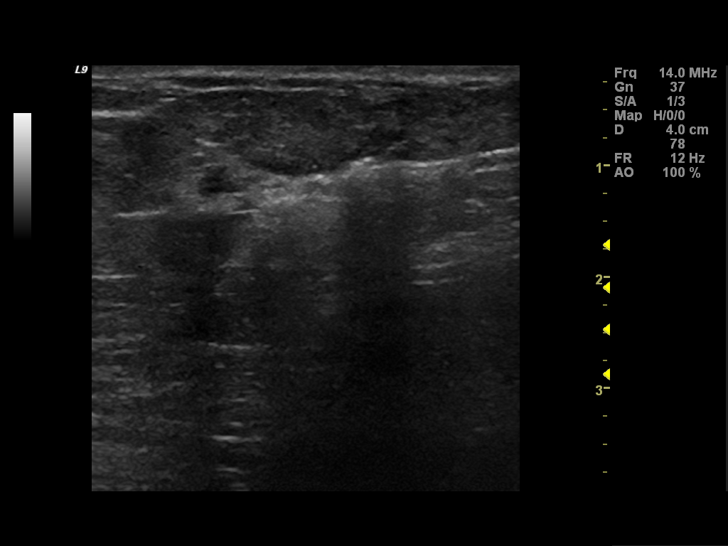
[im 12/14]
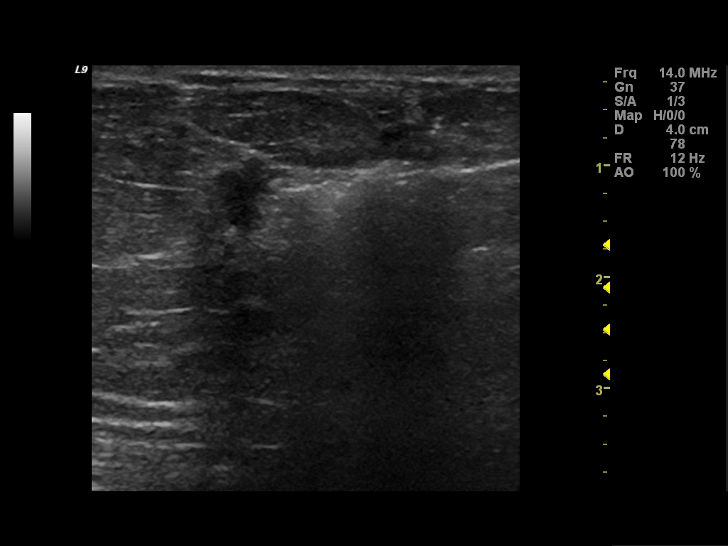
[im 13/14]
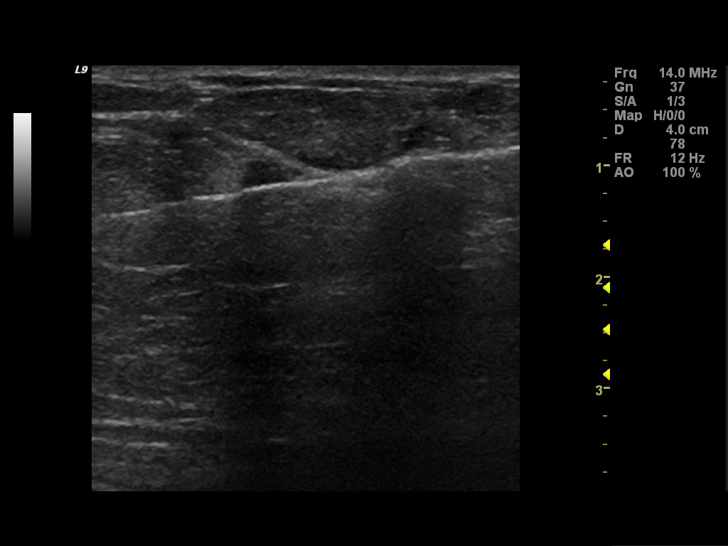
[im 14/14]
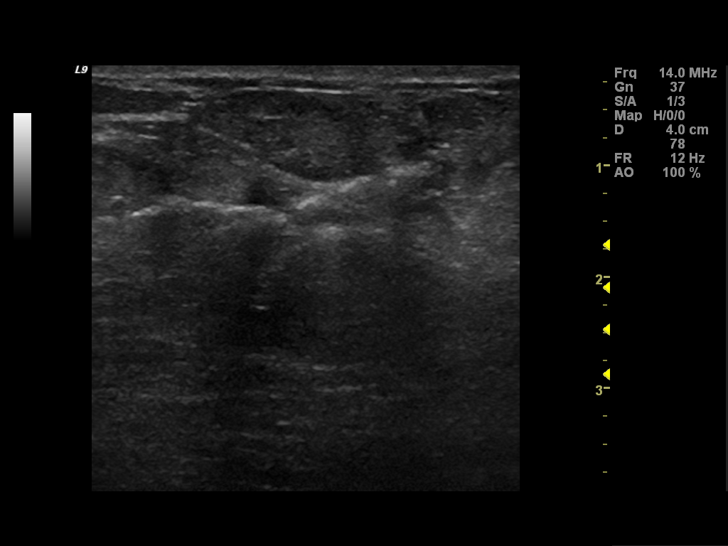

[13 of 14 positions shown; findings below may reference images not displayed]

ACR Breast Density Category b: There are scattered areas of
fibroglandular density.
FINDINGS: Spot compression views demonstrate a spiculated 1 cm mass over the
slightly inner upper left breast.

Ultrasound is performed, showing an irregular bordered hypoechoic
solid mass with minimal shadowing at the [DATE] position of the left
breast 8 cm from the nipple measuring 8 x 9 x 9 mm. Ultrasound of
the left axilla demonstrates no abnormal appearing lymph nodes.
IMPRESSION: Suspicious 8 x 9 x 9 mm irregular bordered hypoechoic mass at the
[DATE] position of the left breast 8 cm from the nipple.

RECOMMENDATION:
Recommend ultrasound-guided core needle biopsy of the suspicious
mass.

I have discussed the findings and recommendations with the patient.
Results were also provided in writing at the conclusion of the
visit. If applicable, a reminder letter will be sent to the patient
regarding the next appointment.

BI-RADS CATEGORY  5: Highly suggestive of malignancy - appropriate
action should be taken.

Biopsy will be done today.

## 2014-04-20 IMAGING — MG MM DIGITAL DIAGNOSTIC LIMITED*L*
2 series · 2 of 2 positions shown · non-contrast
Comparison: 09/01/2012, 09/27/2011, 07/19/2010, 05/26/2009,
04/01/2008 and 02/05/2007

CLINICAL DATA: Patient presents for additional views of the left
breast as followup to a recent screening exam suggesting a possible
mass.

EXAM:
DIGITAL DIAGNOSTIC UNILATERAL LEFT MAMMOGRAM LIMITED; ULTRASOUND
LEFT BREAST

[L CC]
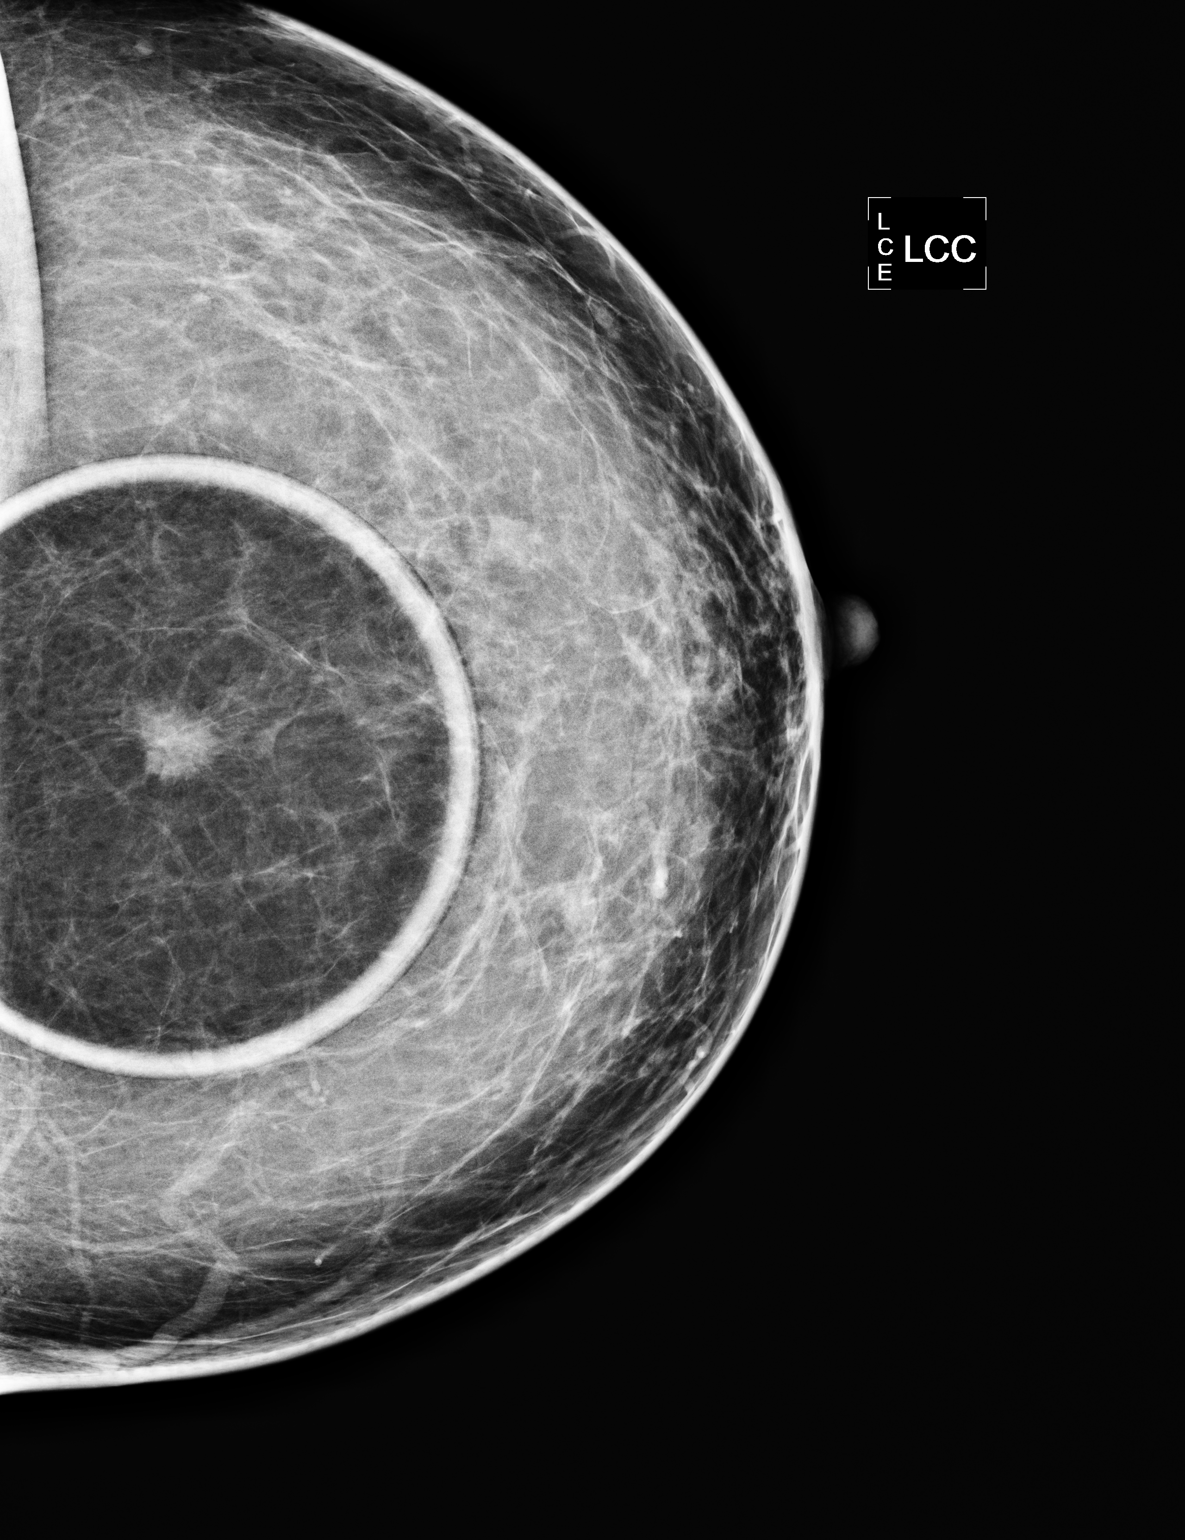

[L MLO]
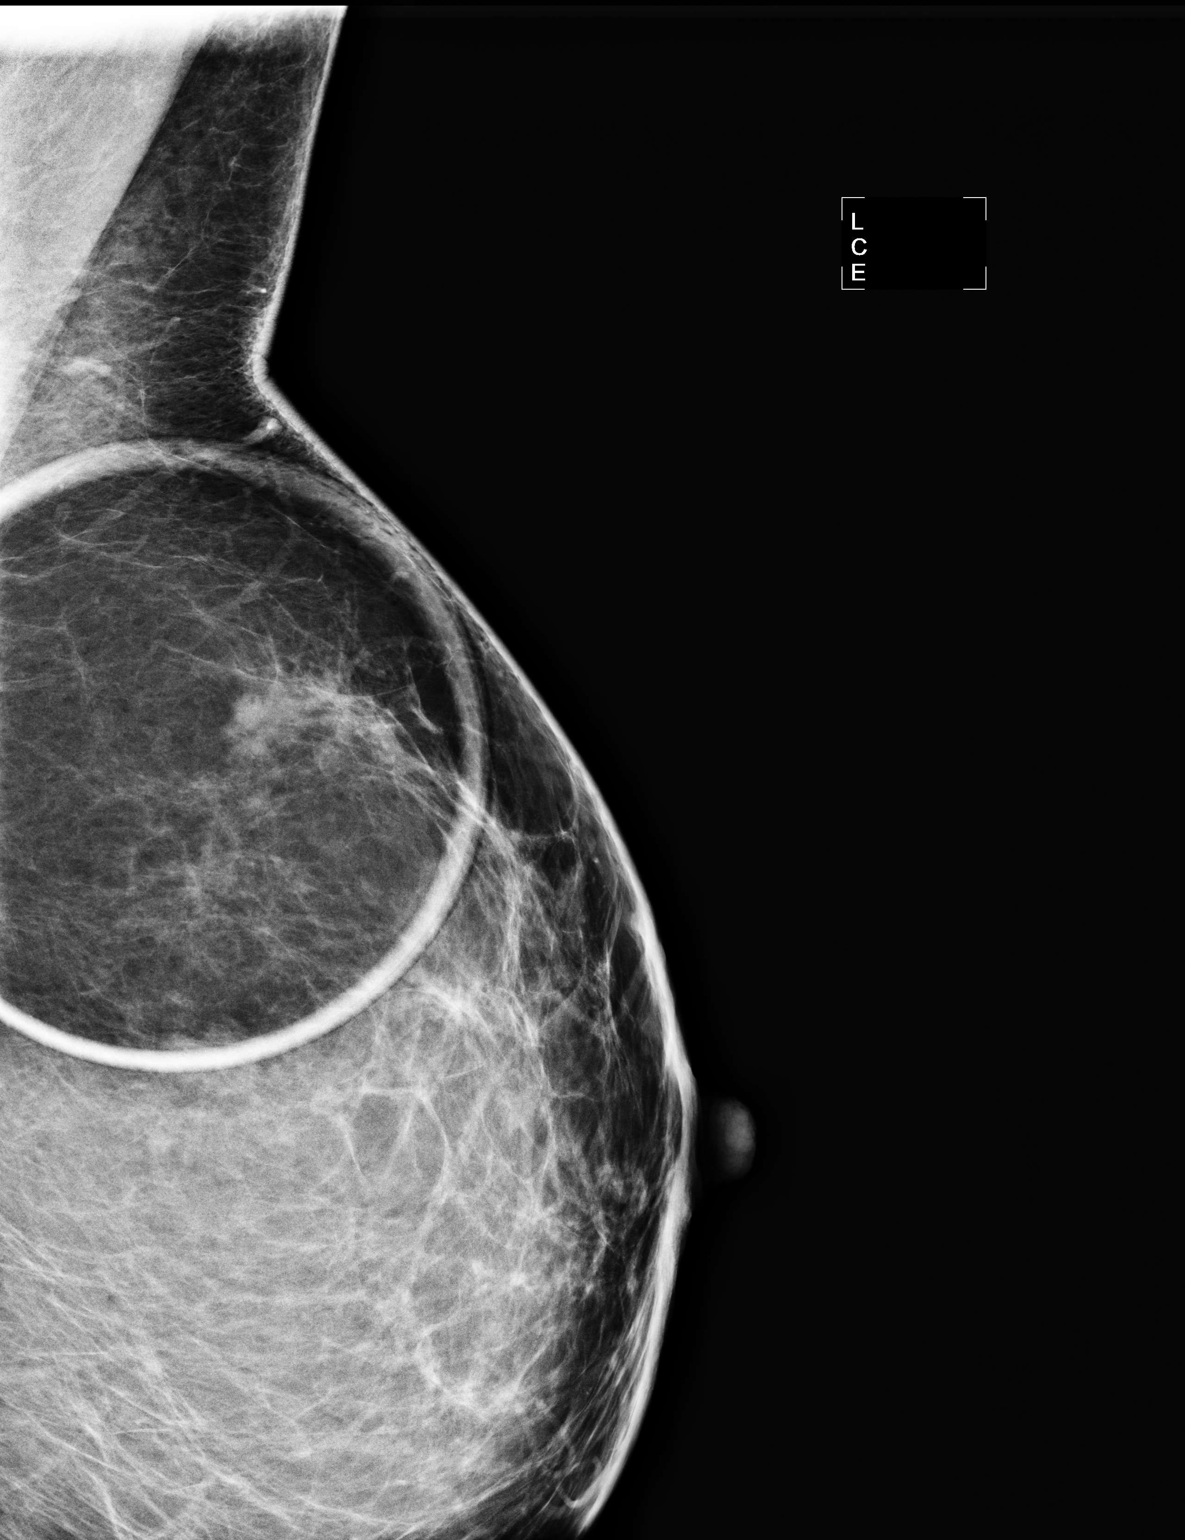

[2 of 2 positions shown; findings below may reference images not displayed]

ACR Breast Density Category b: There are scattered areas of
fibroglandular density.
FINDINGS: Spot compression views demonstrate a spiculated 1 cm mass over the
slightly inner upper left breast.

Ultrasound is performed, showing an irregular bordered hypoechoic
solid mass with minimal shadowing at the [DATE] position of the left
breast 8 cm from the nipple measuring 8 x 9 x 9 mm. Ultrasound of
the left axilla demonstrates no abnormal appearing lymph nodes.
IMPRESSION: Suspicious 8 x 9 x 9 mm irregular bordered hypoechoic mass at the
[DATE] position of the left breast 8 cm from the nipple.

RECOMMENDATION:
Recommend ultrasound-guided core needle biopsy of the suspicious
mass.

I have discussed the findings and recommendations with the patient.
Results were also provided in writing at the conclusion of the
visit. If applicable, a reminder letter will be sent to the patient
regarding the next appointment.

BI-RADS CATEGORY  5: Highly suggestive of malignancy - appropriate
action should be taken.

Biopsy will be done today.

## 2014-04-21 ENCOUNTER — Ambulatory Visit
Admission: RE | Admit: 2014-04-21 | Discharge: 2014-04-21 | Disposition: A | Discharge status: Home / Self Care | Payer: 59 | Source: Ambulatory Visit | Attending: Radiation Oncology | Admitting: Radiation Oncology

## 2014-04-22 ENCOUNTER — Ambulatory Visit
Admission: RE | Admit: 2014-04-22 | Discharge: 2014-04-22 | Disposition: A | Discharge status: Home / Self Care | Payer: 59 | Source: Ambulatory Visit | Attending: Radiation Oncology | Admitting: Radiation Oncology

## 2014-04-22 ENCOUNTER — Ambulatory Visit: Payer: 59

## 2014-04-25 ENCOUNTER — Ambulatory Visit
Admission: RE | Admit: 2014-04-25 | Discharge: 2014-04-25 | Disposition: A | Discharge status: Home / Self Care | Payer: 59 | Source: Ambulatory Visit | Attending: Radiation Oncology | Admitting: Radiation Oncology

## 2014-04-26 ENCOUNTER — Ambulatory Visit
Admission: RE | Admit: 2014-04-26 | Discharge: 2014-04-26 | Disposition: A | Discharge status: Home / Self Care | Payer: 59 | Source: Ambulatory Visit | Attending: Radiation Oncology | Admitting: Radiation Oncology

## 2014-04-26 ENCOUNTER — Encounter: Payer: Self-pay | Admitting: Radiation Oncology

## 2014-04-26 VITALS — BP 162/102 | HR 86 | Resp 16 | Wt 194.9 lb

## 2014-04-26 DIAGNOSIS — C50212 Malignant neoplasm of upper-inner quadrant of left female breast: Secondary | ICD-10-CM

## 2014-04-26 MED ORDER — ALRA NON-METALLIC DEODORANT (RAD-ONC)
1.0000 "application " | Freq: Once | TOPICAL | Status: AC
  Administered 2014-04-26: 1 via TOPICAL

## 2014-04-26 MED ORDER — RADIAPLEXRX EX GEL
Freq: Once | CUTANEOUS | Status: AC
  Administered 2014-04-26: 17:00:00 via TOPICAL

## 2014-04-26 NOTE — Progress Notes (Signed)
Weekly Management Note Current Dose: 44  Gy  Projected Dose: 60 Gy   Narrative:  The patient presents for routine under treatment assessment.  CBCT/MVCT images/Port film x-rays were reviewed.  The chart was checked. Tearful. Worried about fear of recurrence and recently a friend died of cancer. Skin "OK"  Physical Findings: Weight: 194 lb 14.4 oz (88.406 kg). Unchanged. Tearful.   Impression:  The patient is tolerating radiation.  Plan:  Continue treatment as planned. Support provided. Discussed skin care. Follow up with med onc to discuss AI. Went over her pathology report again.

## 2014-04-26 NOTE — Progress Notes (Signed)
Patient reports using hydrocortisone and radiaplex as directed. Provided patient with additional tube of radiaplex and alra as hers is getting low. Patient tearful today. Attempted to comfort patient. She expressed she didn't wish to discuss anything but, that she is "done and tired of all this." Patient reports skin changes.

## 2014-04-27 ENCOUNTER — Ambulatory Visit
Admission: RE | Admit: 2014-04-27 | Discharge: 2014-04-27 | Disposition: A | Discharge status: Home / Self Care | Payer: 59 | Source: Ambulatory Visit | Attending: Radiation Oncology | Admitting: Radiation Oncology

## 2014-04-27 ENCOUNTER — Ambulatory Visit: Payer: 59

## 2014-04-28 ENCOUNTER — Ambulatory Visit
Admission: RE | Admit: 2014-04-28 | Discharge: 2014-04-28 | Disposition: A | Discharge status: Home / Self Care | Payer: 59 | Source: Ambulatory Visit | Attending: Radiation Oncology | Admitting: Radiation Oncology

## 2014-04-29 ENCOUNTER — Ambulatory Visit
Admission: RE | Admit: 2014-04-29 | Discharge: 2014-04-29 | Disposition: A | Discharge status: Home / Self Care | Payer: 59 | Source: Ambulatory Visit | Attending: Radiation Oncology | Admitting: Radiation Oncology

## 2014-04-29 ENCOUNTER — Ambulatory Visit: Payer: 59

## 2014-05-02 ENCOUNTER — Ambulatory Visit: Payer: 59

## 2014-05-02 ENCOUNTER — Ambulatory Visit
Admission: RE | Admit: 2014-05-02 | Discharge: 2014-05-02 | Disposition: A | Discharge status: Home / Self Care | Payer: 59 | Source: Ambulatory Visit | Attending: Radiation Oncology | Admitting: Radiation Oncology

## 2014-05-03 ENCOUNTER — Ambulatory Visit
Admission: RE | Admit: 2014-05-03 | Discharge: 2014-05-03 | Disposition: A | Discharge status: Home / Self Care | Payer: 59 | Source: Ambulatory Visit | Attending: Radiation Oncology | Admitting: Radiation Oncology

## 2014-05-03 ENCOUNTER — Encounter: Payer: Self-pay | Admitting: Radiation Oncology

## 2014-05-03 ENCOUNTER — Ambulatory Visit: Payer: 59

## 2014-05-03 VITALS — BP 132/79 | HR 77 | Temp 98.6°F | Ht 68.0 in | Wt 197.1 lb

## 2014-05-03 DIAGNOSIS — C50212 Malignant neoplasm of upper-inner quadrant of left female breast: Secondary | ICD-10-CM

## 2014-05-03 MED ORDER — RADIAPLEXRX EX GEL
Freq: Once | CUTANEOUS | Status: AC
  Administered 2014-05-03: 18:00:00 via TOPICAL

## 2014-05-03 NOTE — Progress Notes (Signed)
Weekly Management Note Current Dose: 54  Gy  Projected Dose: 60 Gy   Narrative:  The patient presents for routine under treatment assessment.  CBCT/MVCT images/Port film x-rays were reviewed.  The chart was checked. Doing well. Skin irritation continues. Pain over left nipple.  Physical Findings: Weight: 197 lb 1.6 oz (89.404 kg). Dry desquamation over right breast.  Impression:  The patient is tolerating radiation.  Plan:  Continue treatment as planned. Continue radiaplex.

## 2014-05-03 NOTE — Addendum Note (Signed)
Encounter addended by: Deirdre Evener, RN on: 05/03/2014  5:55 PM<BR>     Documentation filed: Inpatient MAR

## 2014-05-03 NOTE — Progress Notes (Signed)
Veronica Rogers has received 2 fractions to her boost field,.  She c/o level 8/10 when she presses her breast on the left nipple.  Note erythema and hyperpigmentation of her field.  She states that she was fatigued on this past Sunday, but feels better today.

## 2014-05-03 NOTE — Addendum Note (Signed)
Encounter addended by: Deirdre Evener, RN on: 05/03/2014  5:54 PM<BR>     Documentation filed: Orders

## 2014-05-04 ENCOUNTER — Ambulatory Visit: Payer: 59

## 2014-05-04 ENCOUNTER — Telehealth: Payer: Self-pay | Admitting: *Deleted

## 2014-05-04 ENCOUNTER — Ambulatory Visit
Admission: RE | Admit: 2014-05-04 | Discharge: 2014-05-04 | Disposition: A | Discharge status: Home / Self Care | Payer: 59 | Source: Ambulatory Visit | Attending: Radiation Oncology | Admitting: Radiation Oncology

## 2014-05-04 NOTE — Telephone Encounter (Signed)
Called and spoke with patient and confirmed appt with Dr. Earnest Conroy for 05/10/14 at 330 for labs and 400pm with Dr. Earnest Conroy.

## 2014-05-05 ENCOUNTER — Ambulatory Visit: Admission: RE | Admit: 2014-05-05 | Payer: 59 | Source: Ambulatory Visit

## 2014-05-06 ENCOUNTER — Encounter: Payer: Self-pay | Admitting: Radiation Oncology

## 2014-05-06 ENCOUNTER — Ambulatory Visit
Admission: RE | Admit: 2014-05-06 | Discharge: 2014-05-06 | Disposition: A | Discharge status: Home / Self Care | Payer: 59 | Source: Ambulatory Visit | Attending: Radiation Oncology | Admitting: Radiation Oncology

## 2014-05-08 NOTE — Progress Notes (Signed)
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  Left breast boost/ 10 Gy at 2 Gy per fraction x 5 fractions  Beams/energy:  Opposed "high" tangents with reduced fields / 6 and 10 MVMV photons Enface electrons / 9 MeV electrons  Narrative: The patient tolerated radiation treatment relatively well.   She had some dry desquamation and some fatigue. She was able to work during treatment. Breath hold technique using AlignRT was used to spare cardiac vessels and decrease the risk of heat disease which she complied with very well.   Plan: The patient has completed radiation treatment. The patient will return to radiation oncology clinic for routine followup in one month. I advised them to call or return sooner if they have any questions or concerns related to their recovery or treatment.  ------------------------------------------------  Thea Silversmith, MD

## 2014-05-09 ENCOUNTER — Ambulatory Visit: Payer: 59

## 2014-05-10 ENCOUNTER — Ambulatory Visit (HOSPITAL_BASED_OUTPATIENT_CLINIC_OR_DEPARTMENT_OTHER): Payer: 59 | Admitting: Hematology and Oncology

## 2014-05-10 ENCOUNTER — Other Ambulatory Visit (HOSPITAL_BASED_OUTPATIENT_CLINIC_OR_DEPARTMENT_OTHER): Payer: 59

## 2014-05-10 ENCOUNTER — Telehealth: Payer: Self-pay | Admitting: Adult Health

## 2014-05-10 ENCOUNTER — Ambulatory Visit: Payer: 59

## 2014-05-10 VITALS — BP 112/70 | HR 92 | Temp 98.4°F | Resp 17 | Ht 68.0 in | Wt 193.7 lb

## 2014-05-10 DIAGNOSIS — C773 Secondary and unspecified malignant neoplasm of axilla and upper limb lymph nodes: Secondary | ICD-10-CM

## 2014-05-10 DIAGNOSIS — M7989 Other specified soft tissue disorders: Secondary | ICD-10-CM

## 2014-05-10 DIAGNOSIS — C50212 Malignant neoplasm of upper-inner quadrant of left female breast: Secondary | ICD-10-CM

## 2014-05-10 DIAGNOSIS — C50219 Malignant neoplasm of upper-inner quadrant of unspecified female breast: Secondary | ICD-10-CM

## 2014-05-10 LAB — CBC WITH DIFFERENTIAL/PLATELET
BASO%: 0.9 % (ref 0.0–2.0)
Basophils Absolute: 0.1 10*3/uL (ref 0.0–0.1)
EOS%: 3 % (ref 0.0–7.0)
Eosinophils Absolute: 0.2 10*3/uL (ref 0.0–0.5)
HCT: 38.3 % (ref 34.8–46.6)
HGB: 12.4 g/dL (ref 11.6–15.9)
LYMPH%: 34.1 % (ref 14.0–49.7)
MCH: 26.5 pg (ref 25.1–34.0)
MCHC: 32.4 g/dL (ref 31.5–36.0)
MCV: 81.8 fL (ref 79.5–101.0)
MONO#: 0.4 10*3/uL (ref 0.1–0.9)
MONO%: 6.8 % (ref 0.0–14.0)
NEUT#: 3.5 10*3/uL (ref 1.5–6.5)
NEUT%: 55.2 % (ref 38.4–76.8)
Platelets: 251 10*3/uL (ref 145–400)
RBC: 4.68 10*6/uL (ref 3.70–5.45)
RDW: 15.1 % — AB (ref 11.2–14.5)
WBC: 6.4 10*3/uL (ref 3.9–10.3)
lymph#: 2.2 10*3/uL (ref 0.9–3.3)

## 2014-05-10 LAB — COMPREHENSIVE METABOLIC PANEL (CC13)
ALK PHOS: 53 U/L (ref 40–150)
ALT: 19 U/L (ref 0–55)
AST: 17 U/L (ref 5–34)
Albumin: 4 g/dL (ref 3.5–5.0)
Anion Gap: 13 mEq/L — ABNORMAL HIGH (ref 3–11)
BUN: 14.2 mg/dL (ref 7.0–26.0)
CO2: 25 mEq/L (ref 22–29)
Calcium: 10.3 mg/dL (ref 8.4–10.4)
Chloride: 103 mEq/L (ref 98–109)
Creatinine: 0.9 mg/dL (ref 0.6–1.1)
Glucose: 185 mg/dl — ABNORMAL HIGH (ref 70–140)
POTASSIUM: 4.1 meq/L (ref 3.5–5.1)
SODIUM: 142 meq/L (ref 136–145)
TOTAL PROTEIN: 7.1 g/dL (ref 6.4–8.3)
Total Bilirubin: 0.5 mg/dL (ref 0.20–1.20)

## 2014-05-10 MED ORDER — TAMOXIFEN CITRATE 20 MG PO TABS: 20.0000 mg | ORAL_TABLET | Freq: Every day | ORAL | Status: DC

## 2014-05-10 NOTE — Addendum Note (Signed)
Addended by: Wilmon Arms on: 05/10/2014 06:52 PM   Modules accepted: Orders

## 2014-05-10 NOTE — Telephone Encounter (Signed)
Per pof to sch in 66mths/pt has req if [facility] Rolls if return & [facility] if avail-will sch w. [place]

## 2014-05-10 NOTE — Progress Notes (Signed)
Addendum note: I have asked her to follow up with gynecology for annual pelvic exam and Pap smears while she is on tamoxifen therapy

## 2014-05-10 NOTE — Progress Notes (Signed)
Veronica Rogers [id number] 15-Aug-1961 53 y.o. 05/10/2014 5:10 PM  [place], [place] [address]  Chief complaint: Followup visit for breast cancer  DIAGNOSIS:  53 year old female with left breast cancer stage IA.     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 Rolm Bookbinder  PRIOR ONCOLOGIC HISTORY: As per previously documented note:  Veronica Rogers is a 53 y.o. female.  with  #1Patient had a screening mammogram performed that revealed a left breast mass measuring 9 mm. By ultrasound it was 9 mm at the 11:30 o'clock position 8 cm from the nipple.  She did not have an MRI performed. 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 Patient's case was discussed at the multidisciplinary breast conference. She is now seen in the multidisciplinary breast clinic for discussion of treatment options. She was seen by Dr. Rolm Bookbinder as well as Dr. Gery Pray.  #2 Patient is now status post lumpectomy performed on 10/29/2013. Her final pathology did reveal grade 2, 0.9 cm invasive ductal carcinoma that was ER positive PR positive. One sentinel node was positive for micrometastasis.  #3 Patient also had an Oncotype DX testing done and her recurrence score was 29 giving her a 17% risk of distant recurrence.  #4 S/P curative intentTaxotere Cytoxan  12/07/2013- 2/10/15x  4 cycles  CURRENT THERAPY: Tamoxifen   INTERVAL HISTORY: Veronica Rogers is a 53 year old female with left breast cancer completed her radiation therapy on 05/06/2014 is here for  followup and for initiation of antiestrogen therapy. 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 Her bilateral lower extremity swelling is slightly better she is wearing the stockings to her bilateral lower extremities. She does complain of radiation changes in her left breast denies any pain. She is due to see Dr. Pablo Ledger on 06/09/2014 She denies any nausea, vomiting, decrease in weight or decrease in appetite, blurred vision, headaches, constipation, blood in the stool or blood in the urine, fever    Past Medical History: Past Medical History  Diagnosis Date  . Breast cancer   . Anxiety   . Depression   . Contact lens/glasses fitting     wears contacts or glasses    Past Surgical History: Past Surgical History  Procedure Laterality Date  . Breast biopsy  1996    right breast, benign  . Wisdom tooth extraction    . Colonoscopy    . Breast lumpectomy with needle localization and axillary sentinel lymph node bx Left 10/29/2013    Procedure: BREAST LUMPECTOMY WITH NEEDLE LOCALIZATION AND AXILLARY SENTINEL LYMPH NODE BX;  Surgeon: Rolm Bookbinder, MD;  Location: [facility];  Service: General;  Laterality: Left;    Family History: Family History  Problem Relation Age of Onset  . Squamous cell carcinoma Father   . Cancer Father   . Diabetes Father   . Hypertension Mother   . Diabetes Sister     Social History History  Substance Use Topics  . Smoking status: Never Smoker   . Smokeless tobacco: Never Used  . Alcohol Use: Yes  Comment: 1-2 drinks a month    Allergies: Allergies  Allergen Reactions  . Erythromycin Other (See Comments)    Stomach pain  . Tape Rash    Current Medications: Current Outpatient Prescriptions  Medication Sig Dispense Refill  . glucose blood (TRUETEST TEST) test strip 1 each by Other route every morning. 1 Box  50 each  3  . hyaluronate sodium (RADIAPLEXRX) GEL Apply 1 application topically 2 (two) times daily.      Marland Kitchen pyridOXINE  (VITAMIN B-6) 50 MG tablet Take 1 tablet (50 mg total) by mouth daily.  30 tablet  0  . sertraline (ZOLOFT) 50 MG tablet Take 25 mg by mouth every morning.      Water engineer Bandages & Supports (MEDICAL COMPRESSION STOCKINGS) MISC 20-30  Compression  Below Knees.  2 each  1  . Insulin Pen Needle (CLICKFINE PEN NEEDLES) 32G X 4 MM MISC Inject 1 each into the skin daily.  50 each  1  . tamoxifen (NOLVADEX) 20 MG tablet Take 1 tablet (20 mg total) by mouth daily.  90 tablet  3   No current facility-administered medications for this visit.   REVIEW OF SYSTEMS: A 10 point review of systems was conducted and is otherwise negative except for what is noted above.    PHYSICAL EXAMINATION: Blood pressure 112/70, pulse 92, temperature 98.4 F (36.9 C), temperature source Oral, resp. rate 17, height _0  (1.727 m), weight 193 lb 11.2 oz (87.862 kg), SpO2 97.00%. GENERAL: Patient is a well appearing female in no acute distress HEENT:  Sclerae anicteric.  Oropharynx clear and moist. No ulcerations or evidence of oropharyngeal candidiasis. Neck is supple.  NODES:  No cervical, supraclavicular, or axillary lymphadenopathy palpated.  BREAST EXAM:  Left breast and axilla status post radiation changes with slight erythema noted . Right breast unremarkable. No bilateral axillary lymphadenopathy appreciated LUNGS:  Clear to auscultation bilaterally.  No wheezes or rhonchi. HEART:  Regular rate and rhythm. No murmur appreciated. ABDOMEN:  Soft, nontender.  Positive, normoactive bowel sounds. No organomegaly palpated. MSK:  No focal spinal tenderness to palpation. Full range of motion bilaterally in the upper extremities. EXTREMITIES:  1+ bilateral lower extremity edema with varicosities NEURO:  Nonfocal. Well oriented.  Appropriate affect. ECOG PERFORMANCE STATUS: 0 - Asymptomatic   STUDIES/RESULTS: US Breast Left  16-Oct-2013   CLINICAL DATA:  Patient presents for additional views of the left breast as  followup to a recent screening exam suggesting a possible mass.  EXAM: DIGITAL DIAGNOSTIC UNILATERAL LEFT MAMMOGRAM LIMITED; ULTRASOUND LEFT BREAST  COMPARISON:  09/01/2012, 09/27/2011, 07/19/2010, 05/26/2009, 04/01/2008 and 02/05/2007  ACR Breast Density Category b: There are scattered areas of fibroglandular density.  FINDINGS: Spot compression views demonstrate a spiculated 1 cm mass over the slightly inner upper left breast.  Ultrasound is performed, showing an irregular bordered hypoechoic solid mass with minimal shadowing at the 11:30 position of the left breast 8 cm from the nipple measuring 8 x 9 x 9 mm. Ultrasound of the left axilla demonstrates no abnormal appearing lymph nodes.  IMPRESSION: Suspicious 8 x 9 x 9 mm irregular bordered hypoechoic mass at the 11:30 position of the left breast 8 cm from the nipple.  RECOMMENDATION: Recommend ultrasound-guided core needle biopsy of the suspicious mass.  I have discussed the findings and recommendations with the patient. Results were also provided in writing at the conclusion of the visit. If applicable, a reminder letter will be sent to the patient regarding the next appointment.  BI-RADS CATEGORY  5: Highly suggestive of malignancy - appropriate action should be taken.  Biopsy will be done today.   Electronically Signed   By: Marin Olp M.D.   On: 10/11/2013 16:33   [place]lin L  10/11/2013   CLINICAL DATA:  Patient presents for additional views of the left breast as followup to a recent screening exam suggesting a possible mass.  EXAM: DIGITAL DIAGNOSTIC UNILATERAL LEFT MAMMOGRAM LIMITED; ULTRASOUND LEFT BREAST  COMPARISON:  09/01/2012, 09/27/2011, 07/19/2010, 05/26/2009, 04/01/2008 and 02/05/2007  ACR Breast Density Category b: There are scattered areas of fibroglandular density.  FINDINGS: Spot compression views demonstrate a spiculated 1 cm mass over the slightly inner upper left breast.  Ultrasound is performed, showing an irregular  bordered hypoechoic solid mass with minimal shadowing at the 11:30 position of the left breast 8 cm from the nipple measuring 8 x 9 x 9 mm. Ultrasound of the left axilla demonstrates no abnormal appearing lymph nodes.  IMPRESSION: Suspicious 8 x 9 x 9 mm irregular bordered hypoechoic mass at the 11:30 position of the left breast 8 cm from the nipple.  RECOMMENDATION: Recommend ultrasound-guided core needle biopsy of the suspicious mass.  I have discussed the findings and recommendations with the patient. Results were also provided in writing at the conclusion of the visit. If applicable, a reminder letter will be sent to the patient regarding the next appointment.  BI-RADS CATEGORY  5: Highly suggestive of malignancy - appropriate action should be taken.  Biopsy will be done today.   Electronically Signed   By: Marin Olp M.D.   On: 10/11/2013 16:33   Mm Digital Diagnostic Unilat L  10/11/2013   CLINICAL DATA:  The patient is post ultrasound-guided core biopsy of a 9 mm spiculated mass over the 11:30 position of the left breast.  EXAM: DIGITAL DIAGNOSTIC UNILATERAL LEFT MAMMOGRAM  COMPARISON:  Previous exams  FINDINGS: Mammographic images were obtained following ultrasound guided biopsy of an irregular 9 mm mass of the 11:30 position of the left breast. Exam demonstrates satisfactory placement of a cylindrical shaped metallic clip over the biopsied mass in the upper inner left breast.  IMPRESSION: Satisfactory clip placement post ultrasound core biopsy left breast.  Final Assessment: Post Procedure Mammograms for Marker Placement   Electronically Signed   By: Marin Olp M.D.   On: 10/11/2013 17:04   Mm Radiologist Eval And Mgmt  10/12/2013   EXAM: ESTABLISHED PATIENT OFFICE VISIT -LEVEL II ([id number])  HISTORY OF PRESENT ILLNESS: Screening detected left breast mass. Ultrasound-guided core needle biopsy was performed yesterday. Pathology returned as grade 2-3 invasive ductal carcinoma.  Patient denies  significant pain or bleeding at the biopsy site.  CHIEF COMPLAINT: Patient returns 1 day post ultrasound-guided core needle biopsy of a screening detected 0.9 cm mass in the upper left breast.  PHYSICAL EXAMINATION: Biopsy site in the upper left breast is clean without evidence of bleeding from the incision. The breast is soft on palpation and there is no evidence of hematoma.  ASSESSMENT AND PLAN: 0.9 cm mass in the upper left breast, pathology from ultrasound-guided core needle biopsy revealing grade 2-3 invasive ductal carcinoma, concordant with imaging findings.  Patient is scheduled for the Multidisciplinary Breast Cancer Clinic at the [facility] at [facility] on Wednesday, October 22.   Electronically Signed   By: Evangeline Dakin M.D.   On: 10/12/2013 17:08   [place] Lt Breast Bx W Loc Dev 1st Lesion Img Bx Spec  US Guide  10/11/2013   CLINICAL DATA:  Patient presents for ultrasound-guided core needle biopsy of a 9 mm suspicious mass at the 11:30 position of the left breast 8 cm from the nipple.  EXAM: RADIOLOGY EXAMINATION  COMPARISON:  Previous exams.  PROCEDURE: I met with the patient and we discussed the procedure of ultrasound-guided biopsy, including benefits and alternatives. We discussed the high likelihood of a successful procedure. We discussed the risks of the procedure including infection, bleeding, tissue injury, clip migration, and inadequate sampling. Informed written consent was given.  Using sterile technique and 2% Lidocaine as local anesthetic, under direct ultrasound visualization, a 12 gauge vacuum-assisteddevice was used to perform biopsy of the targeted mass at the 11:30 position using a lateral to medial approach. At the conclusion of the procedure, a top hat shaped tissue marker clip was deployed into the biopsy cavity. Follow-up 2-view mammogram was performed and dictated separately.  The usual time-out protocol was performed immediately prior to the procedure.   IMPRESSION: Ultrasound-guided biopsy of a suspicious left breast mass. No apparent complications.   Electronically Signed   By: Marin Olp M.D.   On: 10/11/2013 16:41     LABS:    Chemistry      Component Value Date/Time   NA 142 05/10/2014 1509   NA 140 04/18/2011 1136   K 4.1 05/10/2014 1509   K 3.8 04/18/2011 1136   CL 103 04/18/2011 1136   CO2 25 05/10/2014 1509   CO2 28 04/18/2011 1136   BUN 14.2 05/10/2014 1509   BUN 13 04/18/2011 1136   CREATININE 0.9 05/10/2014 1509   CREATININE 0.6 04/18/2011 1136      Component Value Date/Time   CALCIUM 10.3 05/10/2014 1509   CALCIUM 9.3 04/18/2011 1136   ALKPHOS 53 05/10/2014 1509   AST 17 05/10/2014 1509   ALT 19 05/10/2014 1509   BILITOT 0.50 05/10/2014 1509      Lab Results  Component Value Date   WBC 6.4 05/10/2014   HGB 12.4 05/10/2014   HCT 38.3 05/10/2014   MCV 81.8 05/10/2014   PLT 251 05/10/2014   PATHOLOGY: ADDITIONAL INFORMATION: 1. A sample (block 1B) was [facility] to [facility] for Oncotype testing. The patient's recurrence score is 29. Those patients who had a recurrence score of 29 had an average rate of distant recurrence of 17%. Enid Cutter MD Pathologist, Electronic Signature ( Signed 11/22/2013) 1. CHROMOGENIC IN-SITU HYBRIDIZATION Results: HER-2/NEU BY CISH - NO AMPLIFICATION OF HER-2 DETECTED. RESULT RATIO OF HER2: CEP 17 SIGNALS 0.95 AVERAGE HER2 COPY NUMBER PER CELL 1.95 REFERENCE RANGE NEGATIVE HER2/Chr17 Ratio <2.0 and Average HER2 copy number <4.0 EQUIVOCAL HER2/Chr17 Ratio <2.0 and Average HER2 copy number 4.0 and <6.0 POSITIVE HER2/Chr17 Ratio >=2.0 and/or Average HER2 copy number >=6.0 Enid Cutter MD Pathologist, Electronic Signature ( Signed 11/04/2013) FINAL DIAGNOSIS Diagnosis 1. Breast, lumpectomy, Left - INVASIVE DUCTAL CARCINOMA, SEE COMMENT. 1 of 4 FINAL for ELENORA, HAWBAKER [phone]) Diagnosis(continued) - LYMPHOVASCULAR INVASION IDENTIFIED. - INVASIVE TUMOR IS 1 MM FROM NEAREST  MARGIN (MEDIAL). - PREVIOUS BIOPSY SITE IDENTIFIED. - SEE TUMOR TEMPLATE BELOW. 2. Breast, excision, left - BENIGN BREAST TISSUE, SEE COMMENT. - NEGATIVE FOR ATYPIA OR MALIGNANCY. - SURGICAL MARGINS, NEGATIVE FOR ATYPIA OR MALIGNANCY. 3. Lymph node, sentinel, biopsy, #1 left Axilla - ONE LYMPH NODE, POSITIVE FOR METASTATIC MAMMARY CARCINOMA (1/1). - TUMOR DEPOSIT IS 1 MM. - NO EXTRACAPSULAR TUMOR EXTENSION IDENTIFIED. 4. Lymph nodes, regional resection, left Axilla - THREE LYMPH NODES, NEGATIVE  FOR TUMOR (0/3). 5. Lymph node, sentinel, biopsy, #2 left - ONE LYMPH NODE, NEGATIVE FOR TUMOR (0/1). 6. Lymph node, sentinel, biopsy, #3 left - ONE LYMPH NODE, NEGATIVE FOR TUMOR (0/1). 7. Lymph node, sentinel, biopsy, #4 left - ONE LYMPH NODE, NEGATIVE FOR TUMOR (0/1). Microscopic Comment 1. BREAST, INVASIVE TUMOR, WITH LYMPH NODE SAMPLING Specimen, including laterality and lymph node sampling (sentinel, non-sentinel): Left breast plus sentinel lymph nodes Procedure: Lumpectomy Histologic type: Ductal Grade: II of III Tubule formation: II Nuclear pleomorphism: II Mitotic:II Tumor size (gross measurement): 0.9 cm Margins: Invasive, distance to closest margin: 1 mm In-situ, distance to closest margin: N/A If margin positive, focally or broadly: N/A Lymphovascular invasion: Present Ductal carcinoma in situ: Absent Grade: N/A Extensive intraductal component: N/A Lobular neoplasia: Absent Tumor focality: Unifocal Treatment effect: None If present, treatment effect in breast tissue, lymph nodes or both: N/A Extent of tumor: Skin: N/A Nipple: N/A Skeletal muscle: N/A Lymph nodes: Examined: 4 Sentinel 3 Non-sentinel 7 Total 2 of 4 FINAL for DELILIAH, SPRANGER [phone]) Microscopic Comment(continued) Lymph nodes with metastasis: 1 Isolated tumor cells (< 0.2 mm): N/A Micrometastasis: (> 0.2 mm and < 2.0 mm): 1 = 1 mm Macrometastasis: (> 2.0 mm): N/A Extracapsular extension:  Absent Breast prognostic profile: Estrogen receptor: Not repeated, previous study demonstrated 100% positivity ([id number]) Progesterone receptor: Not repeated, previous study demonstrated 100% positivity ([id number]) Her 2 neu: Repeated, previous study demonstrated no amplification (1.85) ([id number]) Ki-67: Not repeated, previous study demonstrated 46% proliferative rate ([id number]) Non-neoplastic breast: Previous biopsy site TNM: pT1b, pN1, pMX Comments: None 2. There is no mass grossly identified. Representative section, including margins, demonstrate a nonneoplastic to include fibrocystic change, pseudoangiomatous stromal hyperplasia, and usual ductal hyperplasia. There are no atypical or malignant epithelial or stromal features present. Mali RUND DO Pathologist, Electronic Signature (Case signed 11/02/2013) Intraoperative Diagnosis RAPID INTRAOPERAS  ASSESSMENT/PLAN:    53 year old female with  #1 a screen detected left breast mass measuring 9 mm by ultrasound at the 11:30 o'clock position 8 cm from the nipple. She did not get an MRI. 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   #2 final pathology revealed grade 2, 0.9 cm invasive ductal carcinoma one sentinel node was positive for metastatic disease. Tumor ER positive PR positive HER-2/neu negative with an elevated Ki-67.  #3 Oncotype DX testing recurrence score 29 giving her a 17% risk of distant recurrence at 5 years with tamoxifen alone. Pt is s/p 4 cycles of Taxotere/Cytoxan that completed on 02/08/14  #4 status post completion of radiation therapy on 05/06/2014  #5 Discussed in detail with the patient on anti-estrogen therapy with tamoxifen. Her last menstrual cycle was prior to chemotherapy. We'll  assess her menopausal state by performing estrogen, FSH and LH on the day of next  visit.  I have discussed in detail the benefits and side effects of antiestrogen therapy with tamoxifen including thrombosis, uterine cancer, eye problems and hot flushes. Will initiate tamoxifen 20 mg by mouth once daily and the plan is to continue for total of 10 years.   #5 Bilateral lower extremity swelling/discomfort and varicosities: Bilateral lower extremity swelling is much better. I've asked the patient she might have to see the vascular surgeon for varicose veins. We will refer her to vascular surgery if her pains does not get better by next visit . She continue to follow less sodium and increasing protein  diet.    #6  follow up with radiation oncology and surgery is scheduled  Next followup visit in 2 months with CBC and differential and CMP on the day of next visit   All questions were answered. The patient knows to call the clinic with any problems, questions or concerns. We can certainly see the patient much sooner if necessary.  I spent 30 minutes counseling the patient face to face. The total time spent in the appointment was 10 minutes.  Wilmon Arms, M.D.  [place] [phone]

## 2014-05-11 ENCOUNTER — Ambulatory Visit: Payer: 59

## 2014-05-21 NOTE — Progress Notes (Signed)
Radiation Oncology         ([phone]) [phone] ________________________________  Name: Veronica Rogers      MRN: [id number]          Date: 03/22/2014              DOB: 03/05/1961  Optical Surface Tracking Plan:  Since intensity modulated radiotherapy (IMRT) and 3D conformal radiation treatment methods are predicated on accurate and precise positioning for treatment, intrafraction motion monitoring is medically necessary to ensure accurate and safe treatment delivery.  The ability to quantify intrafraction motion without excessive ionizing radiation dose can only be performed with optical surface tracking. Accordingly, surface imaging offers the opportunity to obtain 3D measurements of patient position throughout IMRT and 3D treatments without excessive radiation exposure.  I am ordering optical surface tracking for this patient's upcoming course of radiotherapy. ________________________________ Signature   Reference:   Ursula Alert, J, et al. Surface imaging-based analysis of intrafraction motion for breast radiotherapy patients.Journal of [place], n. 6, nov. 2014. ISSN [id number].   Available at: <http://www.jacmp.org/index.php/jacmp/article/view/4957>.

## 2014-05-21 NOTE — Progress Notes (Signed)
Name: Veronica Rogers   MRN: [id number]  Date:  04/22/14   DOB: 1961-09-04  Status:outpatient    DIAGNOSIS: Breast cancer.  CONSENT VERIFIED: yes   SET UP: Patient is setup supine   IMMOBILIZATION:  The following immobilization was used:Custom Moldable Pillow, breast board.   NARRATIVE: [name] underwent complex simulation and treatment planning for her boost treatment today.  Her tumor volume was outlined on the planning CT scan. The depth of her cavity was felt to be appropriate for treatment with electrons     9 MeV electrons will be prescribed to the 100% isodose line.   I personally oversaw and approved the construction of a unique block which will be used for beam modification purposes.  A special port plan is requested.

## 2014-05-21 NOTE — Addendum Note (Signed)
Encounter addended by: Thea Silversmith, MD on: 05/21/2014  8:50 AM<BR>     Documentation filed: Notes Section

## 2014-06-03 ENCOUNTER — Ambulatory Visit: Payer: 59 | Admitting: Oncology

## 2014-06-03 ENCOUNTER — Other Ambulatory Visit: Payer: 59

## 2014-06-09 ENCOUNTER — Ambulatory Visit
Admission: RE | Admit: 2014-06-09 | Discharge: 2014-06-09 | Disposition: A | Discharge status: Home / Self Care | Payer: 59 | Source: Ambulatory Visit | Attending: Radiation Oncology | Admitting: Radiation Oncology

## 2014-06-09 VITALS — BP 118/77 | HR 89 | Temp 98.2°F | Resp 20 | Wt 196.8 lb

## 2014-06-09 DIAGNOSIS — C50212 Malignant neoplasm of upper-inner quadrant of left female breast: Secondary | ICD-10-CM

## 2014-06-09 NOTE — Progress Notes (Addendum)
Patient for routine one month follow up completion of radiation to left breast.Skin looks good.Hasn't used lotion with vitamin e.Gave list of lotions which contain vitamin e.Started tamoxifen.Has some bilateral knee pain which she states has been worse since starting chemotherapy.Has some left arm numbness and tingling at times.

## 2014-06-10 NOTE — Progress Notes (Signed)
   Department of Radiation Oncology  Phone:  [phone] Fax:        [phone]   Name: Veronica Rogers MRN: [id number]  DOB: 1961-04-05  Date: 06/09/2014  Follow Up Visit Note  Diagnosis: T1bN0 Invasive Ductal Carcinoma of the Left Breast  Summary and Interval since last radiation: 60 Gy completed 05/06/14  Interval History: Veronica Rogers presents today for routine followup.  She is doing well. She is tolerating tamoxifen well. She has joing taches. She still has some numbness and tingling of her left arm which is stable. She has returned to work she is exercising at work 2-3 times a day. She is pleased with her cosmetic result.   Allergies:  Allergies  Allergen Reactions  . Erythromycin Other (See Comments)    Stomach pain  . Tape Rash    Medications:  Current Outpatient Prescriptions  Medication Sig Dispense Refill  . Elastic Bandages & Supports (MEDICAL COMPRESSION STOCKINGS) MISC 20-30  Compression  Below Knees.  2 each  1  . pyridOXINE (VITAMIN B-6) 50 MG tablet Take 1 tablet (50 mg total) by mouth daily.  30 tablet  0  . sertraline (ZOLOFT) 50 MG tablet Take 25 mg by mouth every morning.      . tamoxifen (NOLVADEX) 20 MG tablet Take 1 tablet (20 mg total) by mouth daily.  90 tablet  3   No current facility-administered medications for this encounter.    Physical Exam:  Filed Vitals:   06/09/14 1432  BP: 118/77  Pulse: 89  Temp: 98.2 F (36.8 C)  Resp: 20  Weight: 196 lb 12.8 oz (89.268 kg)   No distress. Excellent cosmetic result. Slightly dark skin on right breast  IMPRESSION: Veronica Rogers is a 53 y.o. female s/p radiation with resolving acute effects of treatment.  PLAN:  1) discussed yearly mammograms. 2)Discussed sun protection in the treated area 3) Follow up with me prn. Follow up with surgery and medical oncology encouraged. 4) Discussed survivorship resources, FYNN,     Veronica Silversmith, MD

## 2014-06-23 ENCOUNTER — Encounter: Payer: Self-pay | Admitting: Radiation Oncology

## 2014-06-23 ENCOUNTER — Telehealth: Payer: Self-pay | Admitting: *Deleted

## 2014-06-23 ENCOUNTER — Encounter: Payer: Self-pay | Admitting: Adult Health

## 2014-06-23 NOTE — Telephone Encounter (Signed)
Called patient for clarification.  Billing for radiation therapy and chemotherapy for March are not being covered.  Asked for a copy of the [facility] letter she received for [employer] to assist with this request.  Reports she will bring letter tomorrow.  Will notify [employer] to expect letter tomorrow.

## 2014-07-12 ENCOUNTER — Telehealth: Payer: Self-pay | Admitting: Adult Health

## 2014-07-12 ENCOUNTER — Other Ambulatory Visit (HOSPITAL_BASED_OUTPATIENT_CLINIC_OR_DEPARTMENT_OTHER): Payer: 59

## 2014-07-12 ENCOUNTER — Encounter: Payer: Self-pay | Admitting: Adult Health

## 2014-07-12 ENCOUNTER — Ambulatory Visit (HOSPITAL_BASED_OUTPATIENT_CLINIC_OR_DEPARTMENT_OTHER): Payer: 59 | Admitting: Adult Health

## 2014-07-12 VITALS — BP 127/74 | HR 87 | Temp 98.1°F | Resp 18 | Ht 68.0 in | Wt 198.2 lb

## 2014-07-12 DIAGNOSIS — C50219 Malignant neoplasm of upper-inner quadrant of unspecified female breast: Secondary | ICD-10-CM

## 2014-07-12 DIAGNOSIS — C773 Secondary and unspecified malignant neoplasm of axilla and upper limb lymph nodes: Secondary | ICD-10-CM

## 2014-07-12 DIAGNOSIS — C50212 Malignant neoplasm of upper-inner quadrant of left female breast: Secondary | ICD-10-CM

## 2014-07-12 DIAGNOSIS — Z170 Estrogen receptor positive status [ER+]: Secondary | ICD-10-CM

## 2014-07-12 LAB — COMPREHENSIVE METABOLIC PANEL (CC13)
ALK PHOS: 47 U/L (ref 40–150)
ALT: 15 U/L (ref 0–55)
ANION GAP: 11 meq/L (ref 3–11)
AST: 13 U/L (ref 5–34)
Albumin: 3.7 g/dL (ref 3.5–5.0)
BILIRUBIN TOTAL: 0.52 mg/dL (ref 0.20–1.20)
BUN: 13.7 mg/dL (ref 7.0–26.0)
CO2: 25 mEq/L (ref 22–29)
CREATININE: 0.9 mg/dL (ref 0.6–1.1)
Calcium: 9.5 mg/dL (ref 8.4–10.4)
Chloride: 106 mEq/L (ref 98–109)
GLUCOSE: 200 mg/dL — AB (ref 70–140)
Potassium: 4.1 mEq/L (ref 3.5–5.1)
Sodium: 142 mEq/L (ref 136–145)
Total Protein: 6.7 g/dL (ref 6.4–8.3)

## 2014-07-12 LAB — CBC WITH DIFFERENTIAL/PLATELET
BASO%: 1.1 % (ref 0.0–2.0)
Basophils Absolute: 0.1 10*3/uL (ref 0.0–0.1)
EOS ABS: 0.3 10*3/uL (ref 0.0–0.5)
EOS%: 4.8 % (ref 0.0–7.0)
HCT: 35.2 % (ref 34.8–46.6)
HGB: 11.3 g/dL — ABNORMAL LOW (ref 11.6–15.9)
LYMPH%: 36.4 % (ref 14.0–49.7)
MCH: 25.4 pg (ref 25.1–34.0)
MCHC: 32.2 g/dL (ref 31.5–36.0)
MCV: 79 fL — AB (ref 79.5–101.0)
MONO#: 0.3 10*3/uL (ref 0.1–0.9)
MONO%: 4.6 % (ref 0.0–14.0)
NEUT%: 53.1 % (ref 38.4–76.8)
NEUTROS ABS: 3.5 10*3/uL (ref 1.5–6.5)
PLATELETS: 233 10*3/uL (ref 145–400)
RBC: 4.46 10*6/uL (ref 3.70–5.45)
RDW: 16.2 % — ABNORMAL HIGH (ref 11.2–14.5)
WBC: 6.5 10*3/uL (ref 3.9–10.3)
lymph#: 2.4 10*3/uL (ref 0.9–3.3)

## 2014-07-12 NOTE — Telephone Encounter (Signed)
per pof to sch pt appts-sch-pt has add on appt @ lab-will sch-gave pt copy of sch

## 2014-07-12 NOTE — Patient Instructions (Signed)
You are doing well.  You have no sign of recurrence.  Continue taking Tamoxifen daily.  I will call you with your lab results.  I recommend healthy diet, exercise, and monthly breast exams.  Please make sure you follow up with your pcp about your blood glucose.  Breast Self-Awareness Practicing breast self-awareness may pick up problems early, prevent significant medical complications, and possibly save your life. By practicing breast self-awareness, you can become familiar with how your breasts look and feel and if your breasts are changing. This allows you to notice changes early. It can also offer you some reassurance that your breast health is good. One way to learn what is normal for your breasts and whether your breasts are changing is to do a breast self-exam. If you find a lump or something that was not present in the past, it is best to contact your caregiver right away. Other findings that should be evaluated by your caregiver include nipple discharge, especially if it is bloody; skin changes or reddening; areas where the skin seems to be pulled in (retracted); or new lumps and bumps. Breast pain is seldom associated with cancer (malignancy), but should also be evaluated by a caregiver. HOW TO PERFORM A BREAST SELF-EXAM The best time to examine your breasts is 5-7 days after your menstrual period is over. During menstruation, the breasts are lumpier, and it may be more difficult to pick up changes. If you do not menstruate, have reached menopause, or had your uterus removed (hysterectomy), you should examine your breasts at regular intervals, such as monthly. If you are breastfeeding, examine your breasts after a feeding or after using a breast pump. Breast implants do not decrease the risk for lumps or tumors, so continue to perform breast self-exams as recommended. Talk to your caregiver about how to determine the difference between the implant and breast tissue. Also, talk about the amount of  pressure you should use during the exam. Over time, you will become more familiar with the variations of your breasts and more comfortable with the exam. A breast self-exam requires you to remove all your clothes above the waist. 1. Look at your breasts and nipples. Stand in front of a mirror in a room with good lighting. With your hands on your hips, push your hands firmly downward. Look for a difference in shape, contour, and size from one breast to the other (asymmetry). Asymmetry includes puckers, dips, or bumps. Also, look for skin changes, such as reddened or scaly areas on the breasts. Look for nipple changes, such as discharge, dimpling, repositioning, or redness. 2. Carefully feel your breasts. This is best done either in the shower or tub while using soapy water or when flat on your back. Place the arm (on the side of the breast you are examining) above your head. Use the pads (not the fingertips) of your three middle fingers on your opposite hand to feel your breasts. Start in the underarm area and use  inch (2 cm) overlapping circles to feel your breast. Use 3 different levels of pressure (light, medium, and firm pressure) at each circle before moving to the next circle. The light pressure is needed to feel the tissue closest to the skin. The medium pressure will help to feel breast tissue a little deeper, while the firm pressure is needed to feel the tissue close to the ribs. Continue the overlapping circles, moving downward over the breast until you feel your ribs below your breast. Then, move one finger-width  towards the center of the body. Continue to use the  inch (2 cm) overlapping circles to feel your breast as you move slowly up toward the collar bone (clavicle) near the base of the neck. Continue the up and down exam using all 3 pressures until you reach the middle of the chest. Do this with each breast, carefully feeling for lumps or changes. 3.  Keep a written record with breast changes  or normal findings for each breast. By writing this information down, you do not need to depend only on memory for size, tenderness, or location. Write down where you are in your menstrual cycle, if you are still menstruating. Breast tissue can have some lumps or thick tissue. However, see your caregiver if you find anything that concerns you.  SEEK MEDICAL CARE IF:  You see a change in shape, contour, or size of your breasts or nipples.   You see skin changes, such as reddened or scaly areas on the breasts or nipples.   You have an unusual discharge from your nipples.   You feel a new lump or unusually thick areas.  Document Released: 12/16/2005 Document Revised: 12/02/2012 Document Reviewed: 04/01/2012 [facility] Patient Information 2015 [place], [place]. This information is not intended to replace advice given to you by your health care provider. Make sure you discuss any questions you have with your health care provider.

## 2014-07-12 NOTE — Progress Notes (Signed)
Veronica Rogers [id number] December 03, 1961 53 y.o. 07/12/2014 3:44 PM  CC Veronica Rogers, [place] [address]  Chief complaint: Followup visit for breast cancer  DIAGNOSIS:  53 year old female with left breast cancer stage IA.     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 Rolm Bookbinder  PRIOR ONCOLOGIC HISTORY:  As per previously documented note:  Veronica Rogers is a 53 y.o. female.  with  #1Patient had a screening mammogram performed that revealed a left breast mass measuring 9 mm. By ultrasound it was 9 mm at the 11:30 o'clock position 8 cm from the nipple.  She did not have an MRI performed. 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 Patient's case was discussed at the multidisciplinary breast conference. She is now seen in the multidisciplinary breast clinic for discussion of treatment options. She was seen by Dr. Rolm Bookbinder as well as Dr. Gery Pray.  #2 Patient is now status post lumpectomy performed on 10/29/2013. Her final pathology did reveal grade 2, 0.9 cm invasive ductal carcinoma that was ER positive PR positive. One sentinel node was positive for micrometastasis.  #3 Patient also had an Oncotype DX testing done and her recurrence score was 29 giving her a 17% risk of distant recurrence.  #4 S/P curative intent Taxotere Cytoxan  12/07/2013- 2/10/15x  4 cycles  #5 status post completion of radiation therapy on 05/06/2014  CURRENT THERAPY: Tamoxifen   INTERVAL HISTORY:   Veronica Rogers is here today for follow up.  She has been taking Tamoxifen daily and is tolerating it moderately well.  She does  have occasional hot flashes and joint aches, but otherwise denies fevers, chills, nausea, vomiting, constipation, bowel/bladder changes, new pain, or any further concerns.    Past Medical History: Past Medical History  Diagnosis Date  . Breast cancer   . Anxiety   . Depression   . Contact lens/glasses fitting     wears contacts or glasses  . Radiation 03/28/14-05/06/14    Left Breast 60 Gy    Past Surgical History: Past Surgical History  Procedure Laterality Date  . Breast biopsy  1996    right breast, benign  . Wisdom tooth extraction    . Colonoscopy    . Breast lumpectomy with needle localization and axillary sentinel lymph node bx Left 10/29/2013    Procedure: BREAST LUMPECTOMY WITH NEEDLE LOCALIZATION AND AXILLARY SENTINEL LYMPH NODE BX;  Surgeon: Rolm Bookbinder, MD;  Location: [facility];  Service: General;  Laterality: Left;    Family History: Family History  Problem Relation Age of Onset  . Squamous cell carcinoma Father   . Cancer Father   . Diabetes Father   . Hypertension Mother   . Diabetes Sister     Social History History  Substance Use Topics  . Smoking status: Never Smoker   . Smokeless tobacco: Never Used  . Alcohol Use: Yes     Comment: 1-2 drinks a month    Allergies: Allergies  Allergen Reactions  . Erythromycin Other (See Comments)    Stomach pain  . Tape Rash    Current Medications: Current Outpatient  Prescriptions  Medication Sig Dispense Refill  . BIOTIN PO Take 1 tablet by mouth every morning.      . pyridOXINE (VITAMIN B-6) 50 MG tablet Take 1 tablet (50 mg total) by mouth daily.  30 tablet  0  . sertraline (ZOLOFT) 50 MG tablet Take 50 mg by mouth every morning.       . tamoxifen (NOLVADEX) 20 MG tablet Take 1 tablet (20 mg total) by mouth daily.  90 tablet  3   No current facility-administered medications for this visit.   REVIEW OF SYSTEMS: A 10 point review of systems was conducted and is otherwise negative  except for what is noted above.    PHYSICAL EXAMINATION: Blood pressure 127/74, pulse 87, temperature 98.1 F (36.7 C), temperature source Oral, resp. rate 18, height $RemoveBe'5\' 8"'iKkZiikOv$  (1.727 m), weight 198 lb 3.2 oz (89.903 kg). GENERAL: Patient is a well appearing female in no acute distress HEENT:  Sclerae anicteric.  Oropharynx clear and moist. No ulcerations or evidence of oropharyngeal candidiasis. Neck is supple.  NODES:  No cervical, supraclavicular, or axillary lymphadenopathy palpated.  BREAST EXAM:  Left breast s/p lumpectomy, no nodules or masses, right breast s/p nodules or masses, benign bilateral breast exam. LUNGS:  Clear to auscultation bilaterally.  No wheezes or rhonchi. HEART:  Regular rate and rhythm. No murmur appreciated. ABDOMEN:  Soft, nontender.  Positive, normoactive bowel sounds. No organomegaly palpated. MSK:  No focal spinal tenderness to palpation. Full range of motion bilaterally in the upper extremities. EXTREMITIES:  No peripheral edema NEURO:  Nonfocal. Well oriented.  Appropriate affect. ECOG PERFORMANCE STATUS: 0 - Asymptomatic   STUDIES/RESULTS: US Breast Left  2013-10-27   CLINICAL DATA:  Patient presents for additional views of the left breast as followup to a recent screening exam suggesting a possible mass.  EXAM: DIGITAL DIAGNOSTIC UNILATERAL LEFT MAMMOGRAM LIMITED; ULTRASOUND LEFT BREAST  COMPARISON:  09/01/2012, 09/27/2011, 07/19/2010, 05/26/2009, 04/01/2008 and 02/05/2007  ACR Breast Density Category b: There are scattered areas of fibroglandular density.  FINDINGS: Spot compression views demonstrate a spiculated 1 cm mass over the slightly inner upper left breast.  Ultrasound is performed, showing an irregular bordered hypoechoic solid mass with minimal shadowing at the 11:30 position of the left breast 8 cm from the nipple measuring 8 x 9 x 9 mm. Ultrasound of the left axilla demonstrates no abnormal appearing lymph nodes.  IMPRESSION: Suspicious 8 x 9 x 9 mm  irregular bordered hypoechoic mass at the 11:30 position of the left breast 8 cm from the nipple.  RECOMMENDATION: Recommend ultrasound-guided core needle biopsy of the suspicious mass.  I have discussed the findings and recommendations with the patient. Results were also provided in writing at the conclusion of the visit. If applicable, a reminder letter will be sent to the patient regarding the next appointment.  BI-RADS CATEGORY  5: Highly suggestive of malignancy - appropriate action should be taken.  Biopsy will be done today.   Electronically Signed   By: Marin Olp M.D.   On: 2013-10-27 16:33   [place] L  27-Oct-2013   CLINICAL DATA:  Patient presents for additional views of the left breast as followup to a recent screening exam suggesting a possible mass.  EXAM: DIGITAL DIAGNOSTIC UNILATERAL LEFT MAMMOGRAM LIMITED; ULTRASOUND LEFT BREAST  COMPARISON:  09/01/2012, 09/27/2011, 07/19/2010, 05/26/2009, 04/01/2008 and 02/05/2007  ACR Breast Density Category b: There are scattered areas of fibroglandular density.  FINDINGS: Spot compression views demonstrate a spiculated 1 cm  mass over the slightly inner upper left breast.  Ultrasound is performed, showing an irregular bordered hypoechoic solid mass with minimal shadowing at the 11:30 position of the left breast 8 cm from the nipple measuring 8 x 9 x 9 mm. Ultrasound of the left axilla demonstrates no abnormal appearing lymph nodes.  IMPRESSION: Suspicious 8 x 9 x 9 mm irregular bordered hypoechoic mass at the 11:30 position of the left breast 8 cm from the nipple.  RECOMMENDATION: Recommend ultrasound-guided core needle biopsy of the suspicious mass.  I have discussed the findings and recommendations with the patient. Results were also provided in writing at the conclusion of the visit. If applicable, a reminder letter will be sent to the patient regarding the next appointment.  BI-RADS CATEGORY  5: Highly suggestive of malignancy - appropriate  action should be taken.  Biopsy will be done today.   Electronically Signed   By: Marin Olp M.D.   On: 10/11/2013 16:33   Mm Digital Diagnostic Unilat L  10/11/2013   CLINICAL DATA:  The patient is post ultrasound-guided core biopsy of a 9 mm spiculated mass over the 11:30 position of the left breast.  EXAM: DIGITAL DIAGNOSTIC UNILATERAL LEFT MAMMOGRAM  COMPARISON:  Previous exams  FINDINGS: Mammographic images were obtained following ultrasound guided biopsy of an irregular 9 mm mass of the 11:30 position of the left breast. Exam demonstrates satisfactory placement of a cylindrical shaped metallic clip over the biopsied mass in the upper inner left breast.  IMPRESSION: Satisfactory clip placement post ultrasound core biopsy left breast.  Final Assessment: Post Procedure Mammograms for Marker Placement   Electronically Signed   By: Marin Olp M.D.   On: 10/11/2013 17:04   Mm Radiologist Eval And Mgmt  10/12/2013   EXAM: ESTABLISHED PATIENT OFFICE VISIT -LEVEL II (27782)  HISTORY OF PRESENT ILLNESS: Screening detected left breast mass. Ultrasound-guided core needle biopsy was performed yesterday. Pathology returned as grade 2-3 invasive ductal carcinoma.  Patient denies significant pain or bleeding at the biopsy site.  CHIEF COMPLAINT: Patient returns 1 day post ultrasound-guided core needle biopsy of a screening detected 0.9 cm mass in the upper left breast.  PHYSICAL EXAMINATION: Biopsy site in the upper left breast is clean without evidence of bleeding from the incision. The breast is soft on palpation and there is no evidence of hematoma.  ASSESSMENT AND PLAN: 0.9 cm mass in the upper left breast, pathology from ultrasound-guided core needle biopsy revealing grade 2-3 invasive ductal carcinoma, concordant with imaging findings.  Patient is scheduled for the Multidisciplinary Breast Cancer Clinic at the [facility] on Wednesday, October 22.   Electronically  Signed   By: Evangeline Dakin M.D.   On: 10/12/2013 17:08   [place] Lt Breast Bx W Loc Dev 1st Lesion Img Bx Spec US Guide  10/11/2013   CLINICAL DATA:  Patient presents for ultrasound-guided core needle biopsy of a 9 mm suspicious mass at the 11:30 position of the left breast 8 cm from the nipple.  EXAM: RADIOLOGY EXAMINATION  COMPARISON:  Previous exams.  PROCEDURE: I met with the patient and we discussed the procedure of ultrasound-guided biopsy, including benefits and alternatives. We discussed the high likelihood of a successful procedure. We discussed the risks of the procedure including infection, bleeding, tissue injury, clip migration, and inadequate sampling. Informed written consent was given.  Using sterile technique and 2% Lidocaine as local anesthetic, under direct ultrasound visualization, a 12 gauge vacuum-assisteddevice was used  to perform biopsy of the targeted mass at the 11:30 position using a lateral to medial approach. At the conclusion of the procedure, a top hat shaped tissue marker clip was deployed into the biopsy cavity. Follow-up 2-view mammogram was performed and dictated separately.  The usual time-out protocol was performed immediately prior to the procedure.  IMPRESSION: Ultrasound-guided biopsy of a suspicious left breast mass. No apparent complications.   Electronically Signed   By: Marin Olp M.D.   On: 10/11/2013 16:41     LABS:    Chemistry      Component Value Date/Time   NA 142 07/12/2014 1453   NA 140 04/18/2011 1136   K 4.1 07/12/2014 1453   K 3.8 04/18/2011 1136   CL 103 04/18/2011 1136   CO2 25 07/12/2014 1453   CO2 28 04/18/2011 1136   BUN 13.7 07/12/2014 1453   BUN 13 04/18/2011 1136   CREATININE 0.9 07/12/2014 1453   CREATININE 0.6 04/18/2011 1136      Component Value Date/Time   CALCIUM 9.5 07/12/2014 1453   CALCIUM 9.3 04/18/2011 1136   ALKPHOS 47 07/12/2014 1453   AST 13 07/12/2014 1453   ALT 15 07/12/2014 1453   BILITOT 0.52 07/12/2014 1453      Lab  Results  Component Value Date   WBC 6.5 07/12/2014   HGB 11.3* 07/12/2014   HCT 35.2 07/12/2014   MCV 79.0* 07/12/2014   PLT 233 07/12/2014   PATHOLOGY: ADDITIONAL INFORMATION: 1. A sample (block 1B) was sent to [facility] for Oncotype testing. The patient's recurrence score is 29. Those patients who had a recurrence score of 29 had an average rate of distant recurrence of 17%. Enid Cutter MD Pathologist, Electronic Signature ( Signed 11/22/2013) 1. CHROMOGENIC IN-SITU HYBRIDIZATION Results: HER-2/NEU BY CISH - NO AMPLIFICATION OF HER-2 DETECTED. RESULT RATIO OF HER2: CEP 17 SIGNALS 0.95 AVERAGE HER2 COPY NUMBER PER CELL 1.95 REFERENCE RANGE NEGATIVE HER2/Chr17 Ratio <2.0 and Average HER2 copy number <4.0 EQUIVOCAL HER2/Chr17 Ratio <2.0 and Average HER2 copy number 4.0 and <6.0 POSITIVE HER2/Chr17 Ratio >=2.0 and/or Average HER2 copy number >=6.0 Enid Cutter MD Pathologist, Electronic Signature ( Signed 11/04/2013) FINAL DIAGNOSIS Diagnosis 1. Breast, lumpectomy, Left - INVASIVE DUCTAL CARCINOMA, SEE COMMENT. 1 of 4 FINAL for Veronica Rogers, Veronica Rogers [phone]) Diagnosis(continued) - LYMPHOVASCULAR INVASION IDENTIFIED. - INVASIVE TUMOR IS 1 MM FROM NEAREST MARGIN (MEDIAL). - PREVIOUS BIOPSY SITE IDENTIFIED. - SEE TUMOR TEMPLATE BELOW. 2. Breast, excision, left - BENIGN BREAST TISSUE, SEE COMMENT. - NEGATIVE FOR ATYPIA OR MALIGNANCY. - SURGICAL MARGINS, NEGATIVE FOR ATYPIA OR MALIGNANCY. 3. Lymph node, sentinel, biopsy, #1 left Axilla - ONE LYMPH NODE, POSITIVE FOR METASTATIC MAMMARY CARCINOMA (1/1). - TUMOR DEPOSIT IS 1 MM. - NO EXTRACAPSULAR TUMOR EXTENSION IDENTIFIED. 4. Lymph nodes, regional resection, left Axilla - THREE LYMPH NODES, NEGATIVE FOR TUMOR (0/3). 5. Lymph node, sentinel, biopsy, #2 left - ONE LYMPH NODE, NEGATIVE FOR TUMOR (0/1). 6. Lymph node, sentinel, biopsy, #3 left - ONE LYMPH NODE, NEGATIVE FOR TUMOR (0/1). 7. Lymph node, sentinel, biopsy, #4  left - ONE LYMPH NODE, NEGATIVE FOR TUMOR (0/1). Microscopic Comment 1. BREAST, INVASIVE TUMOR, WITH LYMPH NODE SAMPLING Specimen, including laterality and lymph node sampling (sentinel, non-sentinel): Left breast plus sentinel lymph nodes Procedure: Lumpectomy Histologic type: Ductal Grade: II of III Tubule formation: II Nuclear pleomorphism: II Mitotic:II Tumor size (gross measurement): 0.9 cm Margins: Invasive, distance to closest margin: 1 mm In-situ, distance to closest margin: N/A If margin positive, focally or  broadly: N/A Lymphovascular invasion: Present Ductal carcinoma in situ: Absent Grade: N/A Extensive intraductal component: N/A Lobular neoplasia: Absent Tumor focality: Unifocal Treatment effect: None If present, treatment effect in breast tissue, lymph nodes or both: N/A Extent of tumor: Skin: N/A Nipple: N/A Skeletal muscle: N/A Lymph nodes: Examined: 4 Sentinel 3 Non-sentinel 7 Total 2 of 4 FINAL for Veronica Rogers, Veronica Rogers [phone]) Microscopic Comment(continued) Lymph nodes with metastasis: 1 Isolated tumor cells (< 0.2 mm): N/A Micrometastasis: (> 0.2 mm and < 2.0 mm): 1 = 1 mm Macrometastasis: (> 2.0 mm): N/A Extracapsular extension: Absent Breast prognostic profile: Estrogen receptor: Not repeated, previous study demonstrated 100% positivity ([id number]) Progesterone receptor: Not repeated, previous study demonstrated 100% positivity ([id number]) Her 2 neu: Repeated, previous study demonstrated no amplification (1.85) ([id number]) Ki-67: Not repeated, previous study demonstrated 46% proliferative rate ([id number]) Non-neoplastic breast: Previous biopsy site TNM: pT1b, pN1, pMX Comments: None 2. There is no mass grossly identified. Representative section, including margins, demonstrate a nonneoplastic to include fibrocystic change, pseudoangiomatous stromal hyperplasia, and usual ductal hyperplasia. There are no atypical or malignant epithelial  or stromal features present. [place] RUND DO Pathologist, Electronic Signature (Case signed 11/02/2013) Intraoperative Diagnosis RAPID INTRAOPERAS  ASSESSMENT/PLAN:    53 year old female with  #1 a screening mammogram detected left breast mass measuring 9 mm by ultrasound at the 11:30 o'clock position 8 cm from the nipple. She did not get an MRI. 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   #2 final pathology revealed grade 2, 0.9 cm invasive ductal carcinoma one sentinel node was positive for metastatic disease. Tumor ER positive PR positive HER-2/neu negative with an elevated Ki-67.  #3 Oncotype DX testing recurrence score 29 giving her a 17% risk of distant recurrence at 5 years with tamoxifen alone. Pt is s/p 4 cycles of Taxotere/Cytoxan that completed on 02/08/14.  #4 status post completion of radiation therapy on 05/06/2014  #5 Veronica Rogers was started on Tamoxifen daily beginning 04/2014.  She is tolerating it well and I recommended she continue this.  She is planned to take Tamoxifen daily for 10 years.    #6 I discussed survivorship with Veronica Rogers in detail.  I recommended she continue with a healthy diet, exercise and monthly breast exams.    Veronica Rogers will return in 6 months for labs and follow up.     All questions were answered. The patient knows to call the clinic with any problems, questions or concerns. We can certainly see the patient much sooner if necessary.  I spent 15 minutes counseling the patient face to face. The total time spent in the appointment was 30 minutes.  Minette Headland, [place] [phone]

## 2014-07-12 NOTE — Telephone Encounter (Signed)
lab closed-pt stated will come 7/22 @3 :45 for lab appt

## 2014-07-13 LAB — FOLLICLE STIMULATING HORMONE: FSH: 63.1 m[IU]/mL

## 2014-07-13 LAB — LUTEINIZING HORMONE: LH: 30.6 m[IU]/mL

## 2014-07-20 ENCOUNTER — Other Ambulatory Visit (HOSPITAL_BASED_OUTPATIENT_CLINIC_OR_DEPARTMENT_OTHER): Payer: 59

## 2014-07-20 DIAGNOSIS — C50219 Malignant neoplasm of upper-inner quadrant of unspecified female breast: Secondary | ICD-10-CM

## 2014-07-20 DIAGNOSIS — C50212 Malignant neoplasm of upper-inner quadrant of left female breast: Secondary | ICD-10-CM

## 2014-07-20 DIAGNOSIS — C773 Secondary and unspecified malignant neoplasm of axilla and upper limb lymph nodes: Secondary | ICD-10-CM

## 2014-07-27 LAB — ESTRADIOL, ULTRA SENS: ESTRADIOL, ULTRA SENSITIVE: 4 pg/mL

## 2014-08-24 ENCOUNTER — Encounter: Payer: Self-pay | Admitting: Family Medicine

## 2014-08-24 ENCOUNTER — Ambulatory Visit (INDEPENDENT_AMBULATORY_CARE_PROVIDER_SITE_OTHER): Payer: 59 | Admitting: Family Medicine

## 2014-08-24 VITALS — BP 118/74 | HR 77 | Temp 97.8°F | Ht 67.25 in | Wt 196.8 lb

## 2014-08-24 DIAGNOSIS — R7309 Other abnormal glucose: Secondary | ICD-10-CM

## 2014-08-24 DIAGNOSIS — C50212 Malignant neoplasm of upper-inner quadrant of left female breast: Secondary | ICD-10-CM

## 2014-08-24 DIAGNOSIS — C50219 Malignant neoplasm of upper-inner quadrant of unspecified female breast: Secondary | ICD-10-CM

## 2014-08-24 DIAGNOSIS — T380X5A Adverse effect of glucocorticoids and synthetic analogues, initial encounter: Principal | ICD-10-CM

## 2014-08-24 DIAGNOSIS — R739 Hyperglycemia, unspecified: Secondary | ICD-10-CM

## 2014-08-24 NOTE — Progress Notes (Signed)
Subjective:   Patient ID: Veronica Rogers, female    DOB: 02-Jun-1961, 53 y.o.   MRN: [id number]  Veronica Rogers is a pleasant 53 y.o. year old female wit h/o Breast CA, whom I have not seen since 2012 for routine care,  presents to clinic today with Follow-up  on 08/24/2014  HPI: Saw Regina in 01/04/2014 for steroid induced hyperglycemia- note reviewed. a1c was not done. No results found for this basename: HGBA1C  Started on Lantus 18 units qhs and to call back with blood sugar levels.   She was lost to follow up.  Stopped taking after last week of radiation which 04/29/2014.  Finished chemo 6 months ago.  Here today because checked her fasting FSBS past couple of weeks- ranging in 130s to 140s.  No increased thirst or urination.  No blurred vision.  +FH of DM  Lab Results  Component Value Date   WBC 6.5 07/12/2014   HGB 11.3* 07/12/2014   HCT 35.2 07/12/2014   MCV 79.0* 07/12/2014   PLT 233 07/12/2014    Current Outpatient Prescriptions on File Prior to Visit  Medication Sig Dispense Refill  . BIOTIN PO Take 1 tablet by mouth every morning.      . pyridOXINE (VITAMIN B-6) 50 MG tablet Take 1 tablet (50 mg total) by mouth daily.  30 tablet  0  . sertraline (ZOLOFT) 50 MG tablet Take 50 mg by mouth every morning.       . tamoxifen (NOLVADEX) 20 MG tablet Take 1 tablet (20 mg total) by mouth daily.  90 tablet  3   No current facility-administered medications on file prior to visit.    Allergies  Allergen Reactions  . Erythromycin Other (See Comments)    Stomach pain  . Tape Rash    Past Medical History  Diagnosis Date  . Breast cancer   . Anxiety   . Depression   . Contact lens/glasses fitting     wears contacts or glasses  . Radiation 03/28/14-05/06/14    Left Breast 60 Gy    Past Surgical History  Procedure Laterality Date  . Breast biopsy  1996    right breast, benign  . Wisdom tooth extraction    . Colonoscopy    . Breast lumpectomy with needle localization  and axillary sentinel lymph node bx Left 10/29/2013    Procedure: BREAST LUMPECTOMY WITH NEEDLE LOCALIZATION AND AXILLARY SENTINEL LYMPH NODE BX;  Surgeon: Rolm Bookbinder, MD;  Location: [facility];  Service: General;  Laterality: Left;    Family History  Problem Relation Age of Onset  . Squamous cell carcinoma Father   . Cancer Father   . Diabetes Father   . Hypertension Mother   . Diabetes Sister     History   Social History  . Marital Status: Married    Spouse Name: N/A    Number of Children: N/A  . Years of Education: N/A   Occupational History  . Not on file.   Social History Main Topics  . Smoking status: Never Smoker   . Smokeless tobacco: Never Used  . Alcohol Use: Yes     Comment: 1-2 drinks a month  . Drug Use: No  . Sexual Activity: Yes   Other Topics Concern  . Not on file   Social History Narrative  . No narrative on file   The PMH, PSH, Social History, Family History, Medications, and allergies have been reviewed in [facility], and have been updated  if relevant.  Review of Systems    See HPI No nausea, no vomiting Objective:    BP 118/74  Pulse 77  Temp(Src) 97.8 F (36.6 C) (Oral)  Ht 5' 7.25" (1.708 m)  Wt 196 lb 12 oz (89.245 kg)  BMI 30.59 kg/m2  SpO2 95%   Physical Exam Gen:  Alert, pleasant, NAD Psych:  Good eye contact, not anxious or depressed appearing       Assessment & Plan:   Steroid-induced hyperglycemia - Plan: Hemoglobin A1c, Comprehensive metabolic panel  Breast cancer of upper-inner quadrant of left female breast No Follow-up on file.

## 2014-08-24 NOTE — Progress Notes (Signed)
Pre visit review using our clinic review tool, if applicable. No additional management support is needed unless otherwise documented below in the visit note. 

## 2014-08-24 NOTE — Assessment & Plan Note (Signed)
>  25 minutes spent in face to face time with patient, >50% spent in counselling or coordination of care I tried to answer all of her questions.  I did convince her to get an a1c and CMET today.  We can likely start with Metformin and not lantus at this point. The patient indicates understanding of these issues and agrees with the plan.

## 2014-08-24 NOTE — Patient Instructions (Signed)
Great to see you. I will call you with your lab results.   

## 2014-08-25 LAB — COMPREHENSIVE METABOLIC PANEL
ALK PHOS: 49 U/L (ref 39–117)
ALT: 22 U/L (ref 0–35)
AST: 26 U/L (ref 0–37)
Albumin: 4.3 g/dL (ref 3.5–5.2)
BILIRUBIN TOTAL: 0.8 mg/dL (ref 0.2–1.2)
BUN: 16 mg/dL (ref 6–23)
CO2: 30 mEq/L (ref 19–32)
CREATININE: 0.9 mg/dL (ref 0.4–1.2)
Calcium: 9.8 mg/dL (ref 8.4–10.5)
Chloride: 102 mEq/L (ref 96–112)
GFR: 72.14 mL/min (ref >60.00)
Glucose, Bld: 164 mg/dL — ABNORMAL HIGH (ref 70–99)
Potassium: 3.4 mEq/L — ABNORMAL LOW (ref 3.5–5.1)
SODIUM: 139 meq/L (ref 135–145)
TOTAL PROTEIN: 7.4 g/dL (ref 6.0–8.3)

## 2014-08-25 LAB — HEMOGLOBIN A1C: HEMOGLOBIN A1C: 7.8 % — AB (ref 4.6–6.5)

## 2014-08-25 MED ORDER — METFORMIN HCL 500 MG PO TABS: 500.0000 mg | ORAL_TABLET | Freq: Two times a day (BID) | ORAL | Status: DC

## 2014-08-25 NOTE — Addendum Note (Signed)
Addended by: Lucille Passy on: 08/25/2014 12:29 PM   Modules accepted: Orders

## 2014-08-26 ENCOUNTER — Telehealth: Payer: Self-pay | Admitting: Family Medicine

## 2014-08-26 NOTE — Telephone Encounter (Signed)
Patient returned your call.  She asked that you call her back at work [phone].

## 2014-08-26 NOTE — Telephone Encounter (Signed)
Spoke to pt and informed her of results. See lab results

## 2014-10-04 ENCOUNTER — Other Ambulatory Visit: Payer: Self-pay | Admitting: Obstetrics & Gynecology

## 2014-10-05 LAB — CYTOLOGY - PAP

## 2014-10-27 ENCOUNTER — Encounter: Payer: Self-pay | Admitting: Family Medicine

## 2014-11-16 ENCOUNTER — Other Ambulatory Visit: Payer: Self-pay | Admitting: Adult Health

## 2014-11-16 DIAGNOSIS — Z9889 Other specified postprocedural states: Secondary | ICD-10-CM

## 2014-11-16 DIAGNOSIS — Z853 Personal history of malignant neoplasm of breast: Secondary | ICD-10-CM

## 2014-11-22 ENCOUNTER — Ambulatory Visit (INDEPENDENT_AMBULATORY_CARE_PROVIDER_SITE_OTHER): Payer: 59 | Admitting: Family Medicine

## 2014-11-22 ENCOUNTER — Encounter: Payer: Self-pay | Admitting: Family Medicine

## 2014-11-22 VITALS — BP 124/64 | HR 62 | Temp 97.7°F | Wt 198.0 lb

## 2014-11-22 DIAGNOSIS — E119 Type 2 diabetes mellitus without complications: Secondary | ICD-10-CM

## 2014-11-22 DIAGNOSIS — J309 Allergic rhinitis, unspecified: Secondary | ICD-10-CM

## 2014-11-22 DIAGNOSIS — M722 Plantar fascial fibromatosis: Secondary | ICD-10-CM

## 2014-11-22 HISTORY — DX: Type 2 diabetes mellitus without complications: E11.9

## 2014-11-22 LAB — COMPREHENSIVE METABOLIC PANEL
ALBUMIN: 4 g/dL (ref 3.5–5.2)
ALK PHOS: 39 U/L (ref 39–117)
ALT: 17 U/L (ref 0–35)
AST: 21 U/L (ref 0–37)
BUN: 18 mg/dL (ref 6–23)
CALCIUM: 9.4 mg/dL (ref 8.4–10.5)
CO2: 26 mEq/L (ref 19–32)
Chloride: 103 mEq/L (ref 96–112)
Creatinine, Ser: 0.7 mg/dL (ref 0.4–1.2)
GFR: 95.78 mL/min (ref >60.00)
GLUCOSE: 151 mg/dL — AB (ref 70–99)
POTASSIUM: 4.2 meq/L (ref 3.5–5.1)
SODIUM: 139 meq/L (ref 135–145)
TOTAL PROTEIN: 6.9 g/dL (ref 6.0–8.3)
Total Bilirubin: 0.5 mg/dL (ref 0.2–1.2)

## 2014-11-22 LAB — MICROALBUMIN / CREATININE URINE RATIO
CREATININE, U: 116.1 mg/dL
MICROALB UR: 0.6 mg/dL (ref 0.0–1.9)
Microalb Creat Ratio: 0.5 mg/g (ref 0.0–30.0)

## 2014-11-22 LAB — LIPID PANEL
CHOL/HDL RATIO: 6
Cholesterol: 224 mg/dL — ABNORMAL HIGH (ref 0–200)
HDL: 36.1 mg/dL — AB (ref >39.00)
NONHDL: 187.9
TRIGLYCERIDES: 338 mg/dL — AB (ref 0.0–149.0)
VLDL: 67.6 mg/dL — ABNORMAL HIGH (ref 0.0–40.0)

## 2014-11-22 LAB — LDL CHOLESTEROL, DIRECT: Direct LDL: 117.7 mg/dL

## 2014-11-22 LAB — HEMOGLOBIN A1C: Hgb A1c MFr Bld: 7.9 % — ABNORMAL HIGH (ref 4.6–6.5)

## 2014-11-22 MED ORDER — METFORMIN HCL ER 500 MG PO TB24: 500.0000 mg | ORAL_TABLET | Freq: Every day | ORAL | Status: DC

## 2014-11-22 NOTE — Progress Notes (Signed)
Pre visit review using our clinic review tool, if applicable. No additional management support is needed unless otherwise documented below in the visit note. 

## 2014-11-22 NOTE — Patient Instructions (Signed)
Happy Holidays! We will call you with your lab results. Let's try the extended release Metformin- call me with an update.

## 2014-11-22 NOTE — Assessment & Plan Note (Signed)
Advised supportive care with antihistamines, saline nasal sprays. Call or return to clinic prn if these symptoms worsen or fail to improve as anticipated. The patient indicates understanding of these issues and agrees with the plan.

## 2014-11-22 NOTE — Assessment & Plan Note (Signed)
Not well controlled- forgetting second dose of metformin. Will change rx to 500 mg XR daily. Check lipid panel, CMET, a1c and urine micro today.

## 2014-11-22 NOTE — Progress Notes (Signed)
Subjective:   Patient ID: Veronica Rogers, female    DOB: [birth year], 53 y.o.   MRN: [id number]  Veronica Rogers is a pleasant 53 y.o. year old female who presents to clinic today with Follow-up; Medication Refill; and Nasal Congestion  on 11/22/2014  HPI: DM- diagnosed with steroid induced hypergycemia this summer, a1c in August consistent with Diabetes. Taking Metformin 500 mg twice daily.  Sometimes forgets to take second dose- checks FSBS q am- ranging in 150s.  Has been having some morning heel pain.  Gets better as she walks. No known injury recently.  Lab Results  Component Value Date   CHOL 192 04/18/2011   HDL 44.90 04/18/2011   LDLCALC 111* 04/18/2011   TRIG 182.0* 04/18/2011   CHOLHDL 4 04/18/2011    Lab Results  Component Value Date   HGBA1C 7.8* 08/24/2014   Nasal congestion- has had intermittent nasal dryness and congestion.  No sinus pressure, tooth pain or fevers.  Current Outpatient Prescriptions on File Prior to Visit  Medication Sig Dispense Refill  . BIOTIN PO Take 1 tablet by mouth every morning.    . metFORMIN (GLUCOPHAGE) 500 MG tablet Take 1 tablet (500 mg total) by mouth 2 (two) times daily with a meal. 180 tablet 3  . pyridOXINE (VITAMIN B-6) 50 MG tablet Take 1 tablet (50 mg total) by mouth daily. 30 tablet 0  . sertraline (ZOLOFT) 50 MG tablet Take 50 mg by mouth every morning.     . tamoxifen (NOLVADEX) 20 MG tablet Take 1 tablet (20 mg total) by mouth daily. 90 tablet 3   No current facility-administered medications on file prior to visit.    Allergies  Allergen Reactions  . Erythromycin Other (See Comments)    Stomach pain  . Tape Rash    Past Medical History  Diagnosis Date  . Breast cancer   . Anxiety   . Depression   . Contact lens/glasses fitting     wears contacts or glasses  . Radiation 03/28/14-05/06/14    Left Breast 60 Gy    Past Surgical History  Procedure Laterality Date  . Breast biopsy  1996    right breast,  benign  . Wisdom tooth extraction    . Colonoscopy    . Breast lumpectomy with needle localization and axillary sentinel lymph node bx Left 10/29/2013    Procedure: BREAST LUMPECTOMY WITH NEEDLE LOCALIZATION AND AXILLARY SENTINEL LYMPH NODE BX;  Surgeon: Rolm Bookbinder, MD;  Location: [place];  Service: General;  Laterality: Left;    Family History  Problem Relation Age of Onset  . Squamous cell carcinoma Father   . Cancer Father   . Diabetes Father   . Hypertension Mother   . Diabetes Sister     History   Social History  . Marital Status: Married    Spouse Name: N/A    Number of Children: N/A  . Years of Education: N/A   Occupational History  . Not on file.   Social History Main Topics  . Smoking status: Never Smoker   . Smokeless tobacco: Never Used  . Alcohol Use: Yes     Comment: 1-2 drinks a month  . Drug Use: No  . Sexual Activity: Yes   Other Topics Concern  . Not on file   Social History Narrative   The PMH, PSH, Social History, Family History, Medications, and allergies have been reviewed in [facility], and have been updated if relevant.   Review of  Systems  Constitutional: Negative.   HENT: Positive for congestion. Negative for ear discharge, ear pain, facial swelling and hearing loss.   Endocrine: Negative.   Genitourinary: Negative.   Musculoskeletal: Negative for joint swelling and gait problem.  Psychiatric/Behavioral: Negative.   All other systems reviewed and are negative.      Objective:    BP 124/64 mmHg  Pulse 62  Temp(Src) 97.7 F (36.5 C) (Oral)  Wt 198 lb (89.812 kg)  SpO2 97%   Physical Exam  Constitutional: She appears well-developed and well-nourished. No distress.  HENT:  Right Ear: Tympanic membrane normal.  Left Ear: Tympanic membrane normal.  Nose: Rhinorrhea present. No mucosal edema or sinus tenderness.  Cardiovascular: Normal rate and regular rhythm.   Pulmonary/Chest: Effort normal.    Musculoskeletal:       Right foot: Normal.       Left foot: Normal.  Skin: Skin is warm, dry and intact.  Nursing note and vitals reviewed.         Assessment & Plan:   Type 2 diabetes mellitus without complication  Plantar fascia syndrome No Follow-up on file.

## 2014-11-22 NOTE — Assessment & Plan Note (Signed)
New- given handout on plantar fascitis with exercises. Call or return to clinic prn if these symptoms worsen or fail to improve as anticipated.

## 2014-11-28 ENCOUNTER — Encounter: Payer: Self-pay | Admitting: *Deleted

## 2014-12-05 ENCOUNTER — Ambulatory Visit (INDEPENDENT_AMBULATORY_CARE_PROVIDER_SITE_OTHER): Payer: 59 | Admitting: Family Medicine

## 2014-12-05 ENCOUNTER — Encounter: Payer: Self-pay | Admitting: Family Medicine

## 2014-12-05 ENCOUNTER — Ambulatory Visit
Admission: RE | Admit: 2014-12-05 | Discharge: 2014-12-05 | Disposition: A | Discharge status: Home / Self Care | Payer: 59 | Source: Ambulatory Visit | Attending: Adult Health | Admitting: Adult Health

## 2014-12-05 VITALS — BP 118/64 | HR 86 | Temp 97.4°F | Wt 199.8 lb

## 2014-12-05 DIAGNOSIS — Z853 Personal history of malignant neoplasm of breast: Secondary | ICD-10-CM

## 2014-12-05 DIAGNOSIS — Z9889 Other specified postprocedural states: Secondary | ICD-10-CM

## 2014-12-05 DIAGNOSIS — J069 Acute upper respiratory infection, unspecified: Secondary | ICD-10-CM

## 2014-12-05 MED ORDER — HYDROCODONE-HOMATROPINE 5-1.5 MG/5ML PO SYRP: 5.0000 mL | ORAL_SOLUTION | Freq: Three times a day (TID) | ORAL | Status: DC | PRN

## 2014-12-05 MED ORDER — AMOXICILLIN-POT CLAVULANATE 875-125 MG PO TABS: 1.0000 | ORAL_TABLET | Freq: Two times a day (BID) | ORAL | Status: AC

## 2014-12-05 NOTE — Progress Notes (Signed)
SUBJECTIVE:  Veronica Rogers is a 53 y.o. female who complains of coryza, congestion, sneezing, sore throat, post nasal drip, dry cough and left sinus pain for 11 days. She denies a history of fevers and myalgias and denies a history of asthma. Patient denies smoke cigarettes.   Current Outpatient Prescriptions on File Prior to Visit  Medication Sig Dispense Refill  . BIOTIN PO Take 1 tablet by mouth every morning.    . metFORMIN (GLUCOPHAGE XR) 500 MG 24 hr tablet Take 1 tablet (500 mg total) by mouth daily with breakfast. 30 tablet 3  . pyridOXINE (VITAMIN B-6) 50 MG tablet Take 1 tablet (50 mg total) by mouth daily. 30 tablet 0  . sertraline (ZOLOFT) 50 MG tablet Take 50 mg by mouth every morning.     . tamoxifen (NOLVADEX) 20 MG tablet Take 1 tablet (20 mg total) by mouth daily. 90 tablet 3   No current facility-administered medications on file prior to visit.    Allergies  Allergen Reactions  . Erythromycin Other (See Comments)    Stomach pain  . Tape Rash    Past Medical History  Diagnosis Date  . Breast cancer   . Anxiety   . Depression   . Contact lens/glasses fitting     wears contacts or glasses  . Radiation 03/28/14-05/06/14    Left Breast 60 Gy    Past Surgical History  Procedure Laterality Date  . Breast biopsy  1996    right breast, benign  . Wisdom tooth extraction    . Colonoscopy    . Breast lumpectomy with needle localization and axillary sentinel lymph node bx Left 10/29/2013    Procedure: BREAST LUMPECTOMY WITH NEEDLE LOCALIZATION AND AXILLARY SENTINEL LYMPH NODE BX;  Surgeon: Rolm Bookbinder, MD;  Location: [place];  Service: General;  Laterality: Left;    Family History  Problem Relation Age of Onset  . Squamous cell carcinoma Father   . Cancer Father   . Diabetes Father   . Hypertension Mother   . Diabetes Sister     History   Social History  . Marital Status: Married    Spouse Name: N/A    Number of Children: N/A  .  Years of Education: N/A   Occupational History  . Not on file.   Social History Main Topics  . Smoking status: Never Smoker   . Smokeless tobacco: Never Used  . Alcohol Use: Yes     Comment: 1-2 drinks a month  . Drug Use: No  . Sexual Activity: Yes   Other Topics Concern  . Not on file   Social History Narrative   The PMH, PSH, Social History, Family History, Medications, and allergies have been reviewed in [facility], and have been updated if relevant.  OBJECTIVE: BP 118/64 mmHg  Pulse 86  Temp(Src) 97.4 F (36.3 C) (Oral)  Wt 199 lb 12 oz (90.606 kg)  SpO2 94%  She appears well, vital signs are as noted. Ears normal.  Throat and pharynx normal.  Neck supple. No adenopathy in the neck. Nose is congested. Sinuses non tender. The chest is clear, without wheezes or rales.  ASSESSMENT:  viral upper respiratory illness  PLAN: Symptomatic therapy suggested: push fluids, rest and return office visit prn if symptoms persist or worsen. Lack of antibiotic effectiveness discussed with her but given rx for Augmentin to fill if symptoms progress, like fever, worsening sinus pressure, cough becomes more productive.  Call or return to clinic prn if  these symptoms worsen or fail to improve as anticipated.

## 2014-12-05 NOTE — Patient Instructions (Signed)
Great to see you. Ok to  Use hycodan as needed for cough- remember this will make you sleepy. Start Augmentin if your symptoms continue to worsen.

## 2014-12-05 NOTE — Progress Notes (Signed)
Pre visit review using our clinic review tool, if applicable. No additional management support is needed unless otherwise documented below in the visit note. 

## 2014-12-16 NOTE — Progress Notes (Signed)
Certification of Health Care Provider received by fax from [employer].  Put in [employer].

## 2014-12-19 ENCOUNTER — Encounter: Payer: Self-pay | Admitting: Hematology and Oncology

## 2014-12-19 NOTE — Progress Notes (Signed)
Put fmla form on nurse's desk °

## 2014-12-20 ENCOUNTER — Encounter: Payer: Self-pay | Admitting: Hematology and Oncology

## 2014-12-20 NOTE — Progress Notes (Signed)
Faxed fmla form to [employer] @ [phone] °

## 2015-01-09 ENCOUNTER — Other Ambulatory Visit: Payer: Self-pay

## 2015-01-09 MED ORDER — HYDROCODONE-HOMATROPINE 5-1.5 MG/5ML PO SYRP: 5.0000 mL | ORAL_SOLUTION | Freq: Three times a day (TID) | ORAL | Status: DC | PRN

## 2015-01-09 NOTE — Telephone Encounter (Signed)
Spoke to pt and informed her Rx is available for pickup from the front desk 

## 2015-01-09 NOTE — Telephone Encounter (Signed)
PLEASE NOTE: All timestamps contained within this report are represented as [place] Standard Time. CONFIDENTIALTY NOTICE: This fax transmission is intended only for the addressee. It contains information that is legally privileged, confidential or otherwise protected from use or disclosure. If you are not the intended recipient, you are strictly prohibited from reviewing, disclosing, copying using or disseminating any of this information or taking any action in reliance on or regarding this information. If you have received this fax in error, please notify us immediately by telephone so that we can arrange for its return to [place]. Phone: [phone], Toll-Free: [phone], Fax: [phone] Page: 1 of 1 Call Id: [id number] [place] Patient Name: Veronica Rogers Gender: Female DOB: 06-Dec-1961 Age: 54 Y 3 M 17 D Return Phone Number: [phone] (Primary) Address: [place]/State/Zip: [place] [address] Client [facility] - Client Client Site [place] Physician Arnette Norris Contact Type Call Call Type Triage / Clinical Relationship To Patient Self Return Phone Number [phone] (Primary) Chief Complaint Prescription Refill or Medication Request (non symptomatic) Initial Comment Caller states that she is needing to get a refill of her hydrocodone syrup. Don't really have enough until tomorow. Nurse Assessment Nurse: Si Gaul, RN, Tuesday Date/Time Eilene Ghazi Time): 01/08/2015 10:51:51 AM Please select the assessment type ---Request for controlled medication refill Additional Documentation ---Caller states she would like a refill of hydrocodeine syrup that the MD wrote several weeks ago. Is there an on-call physician for the client? ---Yes Do the client directives specifically allow for paging the on-call regarding  scheduled drugs? ---No Additional Documentation ---RN encouraged caller to contact office Monday morning. Caller voiced understanding. Guidelines Guideline Title Affirmed Question Affirmed Notes Nurse Date/Time (Eastern Time) Disp. Time Eilene Ghazi Time) Disposition Final User 01/08/2015 10:03:49 AM Send To Clinical Follow Up Russ Halo 01/08/2015 10:53:00 AM Clinical Call Yes Scalf, RN, Tuesday After Care Instructions Given Call Event Type User Date / Time Description

## 2015-01-09 NOTE — Telephone Encounter (Signed)
Pt left v/m requesting rx hycodan; pt seen 12/05/14 and has recently had prod cough with yellow phlegm. Pt request cb.

## 2015-01-16 ENCOUNTER — Other Ambulatory Visit: Payer: Self-pay | Admitting: *Deleted

## 2015-01-16 DIAGNOSIS — C50212 Malignant neoplasm of upper-inner quadrant of left female breast: Secondary | ICD-10-CM

## 2015-01-17 ENCOUNTER — Telehealth: Payer: Self-pay | Admitting: Hematology and Oncology

## 2015-01-17 ENCOUNTER — Other Ambulatory Visit (HOSPITAL_BASED_OUTPATIENT_CLINIC_OR_DEPARTMENT_OTHER): Payer: 59

## 2015-01-17 ENCOUNTER — Ambulatory Visit (HOSPITAL_BASED_OUTPATIENT_CLINIC_OR_DEPARTMENT_OTHER): Payer: 59 | Admitting: Hematology and Oncology

## 2015-01-17 VITALS — BP 108/63 | HR 80 | Temp 97.9°F | Resp 18 | Ht 67.25 in | Wt 202.2 lb

## 2015-01-17 DIAGNOSIS — C50212 Malignant neoplasm of upper-inner quadrant of left female breast: Secondary | ICD-10-CM

## 2015-01-17 DIAGNOSIS — N951 Menopausal and female climacteric states: Secondary | ICD-10-CM

## 2015-01-17 LAB — CBC WITH DIFFERENTIAL/PLATELET
BASO%: 0.5 % (ref 0.0–2.0)
Basophils Absolute: 0 10*3/uL (ref 0.0–0.1)
EOS ABS: 0.1 10*3/uL (ref 0.0–0.5)
EOS%: 2.2 % (ref 0.0–7.0)
HCT: 35.8 % (ref 34.8–46.6)
HGB: 11.6 g/dL (ref 11.6–15.9)
LYMPH#: 2.5 10*3/uL (ref 0.9–3.3)
LYMPH%: 39 % (ref 14.0–49.7)
MCH: 27.8 pg (ref 25.1–34.0)
MCHC: 32.4 g/dL (ref 31.5–36.0)
MCV: 85.6 fL (ref 79.5–101.0)
MONO#: 0.3 10*3/uL (ref 0.1–0.9)
MONO%: 4.2 % (ref 0.0–14.0)
NEUT%: 54.1 % (ref 38.4–76.8)
NEUTROS ABS: 3.5 10*3/uL (ref 1.5–6.5)
Platelets: 227 10*3/uL (ref 145–400)
RBC: 4.18 10*6/uL (ref 3.70–5.45)
RDW: 13.2 % (ref 11.2–14.5)
WBC: 6.5 10*3/uL (ref 3.9–10.3)

## 2015-01-17 LAB — COMPREHENSIVE METABOLIC PANEL (CC13)
ALT: 17 U/L (ref 0–55)
AST: 16 U/L (ref 5–34)
Albumin: 3.9 g/dL (ref 3.5–5.0)
Alkaline Phosphatase: 43 U/L (ref 40–150)
Anion Gap: 10 mEq/L (ref 3–11)
BUN: 16.9 mg/dL (ref 7.0–26.0)
CHLORIDE: 103 meq/L (ref 98–109)
CO2: 28 meq/L (ref 22–29)
CREATININE: 0.7 mg/dL (ref 0.6–1.1)
Calcium: 9.3 mg/dL (ref 8.4–10.4)
EGFR: >90 mL/min/{1.73_m2} (ref >90)
GLUCOSE: 123 mg/dL (ref 70–140)
Potassium: 4.1 mEq/L (ref 3.5–5.1)
Sodium: 141 mEq/L (ref 136–145)
TOTAL PROTEIN: 6.8 g/dL (ref 6.4–8.3)
Total Bilirubin: 0.28 mg/dL (ref 0.20–1.20)

## 2015-01-17 NOTE — Telephone Encounter (Signed)
, °

## 2015-01-17 NOTE — Assessment & Plan Note (Signed)
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  Occasional hot flashes : I discussed different measures including yoga exercise as well as  medications including  Effexor and Neurontin. she is already on Zoloft , I recommended her to continue with the same for now.   Breast cancer surveillance: 1.  Mammogram done generally 2016 is normal 2.  Breast exam 01/17/2015 is normal  Survivorship:Discussed the importance of physical exercise in decreasing the likelihood of breast cancer recurrence. Recommended 30 mins daily 6 days a week of either brisk walking or cycling or swimming. Encouraged patient to eat more fruits and vegetables and decrease red meat.    return to clinic in 6 months for follow-up

## 2015-01-17 NOTE — Progress Notes (Signed)
Patient Care Team: Lucille Passy, MD as PCP - General (Family Medicine)  DIAGNOSIS: Breast cancer of upper-inner quadrant of left female breast   Staging form: Breast, AJCC 7th Edition     Clinical: Stage IA (T1b, N0, cM0) - Unsigned       Staging comments: Staged at breast conference 10.22.14      Pathologic: Stage IIA (T1b, N1a, cM0) - Signed by Thea Silversmith, MD on 04/26/2014  #1 left breast mass #2 Patient is now status post lumpectomy performed on 10/29/2013. Her final pathology did reveal grade 2, 0.9 cm invasive ductal carcinoma that was ER positive PR positive. One sentinel node was positive for micrometastasis.  #3 Patient also had an Oncotype DX testing done and her recurrence score was 29 giving her a 17% risk of distant recurrence.  #4 S/P curative intent Taxotere Cytoxan 12/07/2013- 2/10/15x 4 cycles  #5 status post completion of radiation therapy on 05/06/2014  CURRENT THERAPY: Tamoxifen  20 mg daily CHIEF COMPLIANT:  Follow-up of breast cancer  INTERVAL HISTORY: Veronica Rogers is a  54 year old lady with above-mentioned history of left-sided breast cancer treated with lumpectomy and it showed 1 lymph node with micrometastatic disease. She had  intermediate risk Oncotype DX and received 4 cycles of Taxotere and Cytoxan. She also completed radiation therapy tamoxifen 90m daily since June 2015. She appears to be tolerating it fairly well other than hot flashes. Recent mammograms have been normal  REVIEW OF SYSTEMS:   Constitutional: Denies fevers, chills or abnormal weight loss Eyes: Denies blurriness of vision Ears, nose, mouth, throat, and face: Denies mucositis or sore throat Respiratory: Denies cough, dyspnea or wheezes Cardiovascular: Denies palpitation, chest discomfort or lower extremity swelling Gastrointestinal:  Denies nausea, heartburn or change in bowel habits Skin: Denies abnormal skin rashes Lymphatics: Denies new lymphadenopathy or easy  bruising Neurological:Denies numbness, tingling or new weaknesses Behavioral/Psych: Mood is stable, no new changes  Breast:  denies any pain or lumps or nodules in either breasts All other systems were reviewed with the patient and are negative.  I have reviewed the past medical history, past surgical history, social history and family history with the patient and they are unchanged from previous note.  ALLERGIES:  is allergic to erythromycin and tape.  MEDICATIONS:  Current Outpatient Prescriptions  Medication Sig Dispense Refill  . BIOTIN PO Take 1 tablet by mouth every morning.    .Marland KitchenHYDROcodone-homatropine (HYCODAN) 5-1.5 MG/5ML syrup Take 5 mLs by mouth every 8 (eight) hours as needed for cough. 120 mL 0  . metFORMIN (GLUCOPHAGE XR) 500 MG 24 hr tablet Take 1 tablet (500 mg total) by mouth daily with breakfast. 30 tablet 3  . pyridOXINE (VITAMIN B-6) 50 MG tablet Take 1 tablet (50 mg total) by mouth daily. 30 tablet 0  . sertraline (ZOLOFT) 50 MG tablet Take 50 mg by mouth every morning.     . tamoxifen (NOLVADEX) 20 MG tablet Take 1 tablet (20 mg total) by mouth daily. 90 tablet 3   No current facility-administered medications for this visit.    PHYSICAL EXAMINATION: ECOG PERFORMANCE STATUS: 0 - Asymptomatic  Filed Vitals:   01/17/15 1422  BP: 108/63  Pulse: 80  Temp: 97.9 F (36.6 C)  Resp: 18   Filed Weights   01/17/15 1422  Weight: 202 lb 3.2 oz (91.717 kg)    GENERAL:alert, no distress and comfortable SKIN: skin color, texture, turgor are normal, no rashes or significant lesions EYES: normal, Conjunctiva are pink and  non-injected, sclera clear OROPHARYNX:no exudate, no erythema and lips, buccal mucosa, and tongue normal  NECK: supple, thyroid normal size, non-tender, without nodularity LYMPH:  no palpable lymphadenopathy in the cervical, axillary or inguinal LUNGS: clear to auscultation and percussion with normal breathing effort HEART: regular rate & rhythm and  no murmurs and no lower extremity edema ABDOMEN:abdomen soft, non-tender and normal bowel sounds Musculoskeletal:no cyanosis of digits and no clubbing  NEURO: alert & oriented x 3 with fluent speech, no focal motor/sensory deficits  LABORATORY DATA:  I have reviewed the data as listed   Chemistry      Component Value Date/Time   NA 141 01/17/2015 1404   NA 139 11/22/2014 0751   K 4.1 01/17/2015 1404   K 4.2 11/22/2014 0751   CL 103 11/22/2014 0751   CO2 28 01/17/2015 1404   CO2 26 11/22/2014 0751   BUN 16.9 01/17/2015 1404   BUN 18 11/22/2014 0751   CREATININE 0.7 01/17/2015 1404   CREATININE 0.7 11/22/2014 0751      Component Value Date/Time   CALCIUM 9.3 01/17/2015 1404   CALCIUM 9.4 11/22/2014 0751   ALKPHOS 43 01/17/2015 1404   ALKPHOS 39 11/22/2014 0751   AST 16 01/17/2015 1404   AST 21 11/22/2014 0751   ALT 17 01/17/2015 1404   ALT 17 11/22/2014 0751   BILITOT 0.28 01/17/2015 1404   BILITOT 0.5 11/22/2014 0751       Lab Results  Component Value Date   WBC 6.5 01/17/2015   HGB 11.6 01/17/2015   HCT 35.8 01/17/2015   MCV 85.6 01/17/2015   PLT 227 01/17/2015   NEUTROABS 3.5 01/17/2015     RADIOGRAPHIC STUDIES: I have personally reviewed the radiology reports and agreed with their findings.  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  Occasional hot flashes : I discussed different measures including yoga exercise as well as  medications including  Effexor and Neurontin. she is already on Zoloft , I recommended her  to continue with the same for now.   Breast cancer surveillance: 1.  Mammogram done generally 2016 is normal 2.  Breast exam 01/17/2015 is normal  Survivorship:Discussed the importance of physical exercise in decreasing the likelihood of breast cancer recurrence. Recommended 30 mins daily 6 days a week of either brisk walking or cycling or swimming. Encouraged patient to eat more fruits and vegetables and decrease red meat.    return to clinic in 6 months for follow-up  No orders of the defined types were placed in this encounter.   The patient has a good understanding of the overall plan. she agrees with it. She will call with any problems that may develop before her next visit here.   Gudena, Vinay K, MD    

## 2015-01-24 LAB — HM DIABETES EYE EXAM

## 2015-01-26 ENCOUNTER — Encounter: Payer: Self-pay | Admitting: Family Medicine

## 2015-02-07 ENCOUNTER — Other Ambulatory Visit: Payer: Self-pay | Admitting: Family Medicine

## 2015-02-07 MED ORDER — HYDROCODONE-HOMATROPINE 5-1.5 MG/5ML PO SYRP: 5.0000 mL | ORAL_SOLUTION | Freq: Three times a day (TID) | ORAL | Status: DC | PRN

## 2015-02-07 NOTE — Telephone Encounter (Signed)
Pt left v/m requesting rx for Hycodan; this is only med that helps pt's cough. Pt request cb when ready for pick up. Pt was seen 12/05/14 for cough and congestion.Please advise.

## 2015-02-07 NOTE — Telephone Encounter (Signed)
Lm on pts vm informing her Rx is available for pickup at the front desk

## 2015-02-17 ENCOUNTER — Encounter: Payer: Self-pay | Admitting: Family Medicine

## 2015-02-17 ENCOUNTER — Ambulatory Visit (INDEPENDENT_AMBULATORY_CARE_PROVIDER_SITE_OTHER): Payer: 59 | Admitting: Family Medicine

## 2015-02-17 VITALS — BP 130/80 | HR 80 | Temp 97.9°F | Wt 194.2 lb

## 2015-02-17 DIAGNOSIS — R197 Diarrhea, unspecified: Secondary | ICD-10-CM

## 2015-02-17 DIAGNOSIS — A09 Infectious gastroenteritis and colitis, unspecified: Secondary | ICD-10-CM

## 2015-02-17 MED ORDER — CIPROFLOXACIN HCL 500 MG PO TABS: 500.0000 mg | ORAL_TABLET | Freq: Two times a day (BID) | ORAL | Status: DC

## 2015-02-17 NOTE — Progress Notes (Signed)
Pre visit review using our clinic review tool, if applicable. No additional management support is needed unless otherwise documented below in the visit note. 

## 2015-02-17 NOTE — Progress Notes (Signed)
BP 130/80 mmHg  Pulse 80  Temp(Src) 97.9 F (36.6 C) (Oral)  Wt 194 lb 4 oz (88.111 kg)   CC: diarrhea  Subjective:    Patient ID: Veronica Rogers, female    DOB: 12/05/1961, 54 y.o.   MRN: [id number]  HPI: Veronica Rogers is a 54 y.o. female presenting on 02/17/2015 for Diarrhea   9 d h/o diarrhea, 3-4 times at night time, then several times during the day as well. +accidents. Liquid immodium helpful but feels she's using too much. Worried may be getting dehydrated. Initially nauseated but that has resolved. Appetite has decreased. Weight loss noted. Stools described as watery.  No fevers/chills, abd pain, vomiting. No recent traveling. No new foods or restaurants.  No sick contacts at home. No recent abx use. No blood in stool. No urinary changes.  Does use well water  On metformin XR 500mg  daily for last 6 months. Previously had tolerated this well. She actually stopped this over last week when diarrhea persisted.  Lab Results  Component Value Date   HGBA1C 7.9* 11/22/2014   Works at outpatient neurorehab.  Relevant past medical, surgical, family and social history reviewed and updated as indicated. Interim medical history since our last visit reviewed. Allergies and medications reviewed and updated. Current Outpatient Prescriptions on File Prior to Visit  Medication Sig  . sertraline (ZOLOFT) 50 MG tablet Take 50 mg by mouth every morning.   . tamoxifen (NOLVADEX) 20 MG tablet Take 1 tablet (20 mg total) by mouth daily.  Marland Kitchen BIOTIN PO Take 1 tablet by mouth every morning.  . metFORMIN (GLUCOPHAGE XR) 500 MG 24 hr tablet Take 1 tablet (500 mg total) by mouth daily with breakfast. (Patient not taking: Reported on 02/17/2015)  . pyridOXINE (VITAMIN B-6) 50 MG tablet Take 1 tablet (50 mg total) by mouth daily. (Patient not taking: Reported on 02/17/2015)   No current facility-administered medications on file prior to visit.    Review of Systems Per HPI unless specifically  indicated above     Objective:    BP 130/80 mmHg  Pulse 80  Temp(Src) 97.9 F (36.6 C) (Oral)  Wt 194 lb 4 oz (88.111 kg)  Wt Readings from Last 3 Encounters:  02/17/15 194 lb 4 oz (88.111 kg)  01/17/15 202 lb 3.2 oz (91.717 kg)  12/05/14 199 lb 12 oz (90.606 kg)    Physical Exam  Constitutional: She appears well-developed and well-nourished. No distress.  HENT:  Mouth/Throat: Oropharynx is clear and moist. No oropharyngeal exudate.  Eyes: Conjunctivae and EOM are normal. Pupils are equal, round, and reactive to light. No scleral icterus.  Cardiovascular: Normal rate, regular rhythm, normal heart sounds and intact distal pulses.   No murmur heard. Pulmonary/Chest: Effort normal and breath sounds normal. No respiratory distress. She has no wheezes. She has no rales.  Abdominal: Soft. Normal appearance and bowel sounds are normal. She exhibits no distension and no mass. There is no hepatosplenomegaly. There is no tenderness. There is no rigidity, no rebound, no guarding, no CVA tenderness and negative Murphy's sign.  Musculoskeletal: She exhibits no edema.  Skin: Skin is warm and dry. No rash noted.  Good skin turgor  Nursing note and vitals reviewed.      Assessment & Plan:   Problem List Items Addressed This Visit    Infectious enteritis - Primary    Anticipate infectious diarrhea.  Given prolonged duration of illness, will treat empirically with cipro 3d course. Have also sent stool studies (  culture and c diff). Discussed pushing fluids over weekend. Discussed red flags to seek urgent care, to update [place] next week if no improvement noted. Pt agrees with plan.       Other Visit Diagnoses    Diarrhea        Relevant Orders    Stool culture    C. difficile GDH and Toxin A/B        Follow up plan: Return if symptoms worsen or fail to improve.

## 2015-02-17 NOTE — Assessment & Plan Note (Signed)
Anticipate infectious diarrhea.  Given prolonged duration of illness, will treat empirically with cipro 3d course. Have also sent stool studies (culture and c diff). Discussed pushing fluids over weekend. Discussed red flags to seek urgent care, to update [place] next week if no improvement noted. Pt agrees with plan.

## 2015-02-17 NOTE — Patient Instructions (Signed)
I think you have infectious diarrhea - treat with cipro twice daily for 3 days. Let us know if not improving with treatment. Prior to starting cipro I want you to collect stool studies - pass by lab to pick these up. Push fluids and plenty of rest. If worsening diarrhea, fever> 101 or abdominal pain/vomiting, please seek care over weekend.  If not improving into next week, call us with update. Good to see you today, call us with questions.

## 2015-02-20 ENCOUNTER — Telehealth: Payer: Self-pay | Admitting: Radiology

## 2015-02-20 ENCOUNTER — Encounter: Payer: Self-pay | Admitting: Radiology

## 2015-02-20 MED ORDER — METRONIDAZOLE 500 MG PO TABS: 500.0000 mg | ORAL_TABLET | Freq: Three times a day (TID) | ORAL | Status: DC

## 2015-02-20 NOTE — Telephone Encounter (Signed)
She said she has had some improvement, but is still going to the restroom about every 45 minutes during the day and ~3 times at night. I advised to check with her pharmacy later this evening, but that you may want to at least get the C-diff result back before prescribing anything else. She verbalized understanding.

## 2015-02-20 NOTE — Telephone Encounter (Signed)
Let's do flagyl 5d course while we await stool studies. If no improvement with this, update [place]

## 2015-02-20 NOTE — Telephone Encounter (Signed)
Any improvement after abx?

## 2015-02-20 NOTE — Telephone Encounter (Signed)
Patient wants another round of ABX she still has symptoms. I told her you may want the results of the stool tests first. She brought samples in this morning.Please call her at work [phone]

## 2015-02-21 LAB — C. DIFFICILE GDH AND TOXIN A/B
C. DIFF TOXIN A/B: NOT DETECTED
C. difficile GDH: NOT DETECTED

## 2015-02-21 NOTE — Telephone Encounter (Signed)
Patient notified and verbalized understanding. 

## 2015-02-22 ENCOUNTER — Encounter: Payer: Self-pay | Admitting: Family Medicine

## 2015-02-22 NOTE — Telephone Encounter (Signed)
Please see Mychart message from patient.  

## 2015-02-24 LAB — STOOL CULTURE

## 2015-02-27 ENCOUNTER — Ambulatory Visit (INDEPENDENT_AMBULATORY_CARE_PROVIDER_SITE_OTHER): Payer: 59 | Admitting: Family Medicine

## 2015-02-27 ENCOUNTER — Encounter: Payer: Self-pay | Admitting: Family Medicine

## 2015-02-27 VITALS — BP 116/68 | HR 63 | Temp 97.6°F | Wt 195.5 lb

## 2015-02-27 DIAGNOSIS — E119 Type 2 diabetes mellitus without complications: Secondary | ICD-10-CM

## 2015-02-27 DIAGNOSIS — A09 Infectious gastroenteritis and colitis, unspecified: Secondary | ICD-10-CM

## 2015-02-27 LAB — COMPREHENSIVE METABOLIC PANEL
ALT: 19 U/L (ref 0–35)
AST: 19 U/L (ref 0–37)
Albumin: 3.8 g/dL (ref 3.5–5.2)
Alkaline Phosphatase: 39 U/L (ref 39–117)
BUN: 11 mg/dL (ref 6–23)
CO2: 27 mEq/L (ref 19–32)
CREATININE: 0.7 mg/dL (ref 0.40–1.20)
Calcium: 9 mg/dL (ref 8.4–10.5)
Chloride: 107 mEq/L (ref 96–112)
GFR: 92.53 mL/min (ref >60.00)
GLUCOSE: 163 mg/dL — AB (ref 70–99)
Potassium: 3.1 mEq/L — ABNORMAL LOW (ref 3.5–5.1)
Sodium: 143 mEq/L (ref 135–145)
Total Bilirubin: 0.3 mg/dL (ref 0.2–1.2)
Total Protein: 6.2 g/dL (ref 6.0–8.3)

## 2015-02-27 LAB — LIPID PANEL
CHOL/HDL RATIO: 4
Cholesterol: 124 mg/dL (ref 0–200)
HDL: 28.7 mg/dL — ABNORMAL LOW (ref >39.00)
LDL CALC: 63 mg/dL (ref 0–99)
NONHDL: 95.3
Triglycerides: 160 mg/dL — ABNORMAL HIGH (ref 0.0–149.0)
VLDL: 32 mg/dL (ref 0.0–40.0)

## 2015-02-27 LAB — HEMOGLOBIN A1C: HEMOGLOBIN A1C: 7.9 % — AB (ref 4.6–6.5)

## 2015-02-27 NOTE — Progress Notes (Signed)
Pre visit review using our clinic review tool, if applicable. No additional management support is needed unless otherwise documented below in the visit note. 

## 2015-02-27 NOTE — Patient Instructions (Signed)
Great to see you. We will call you with your lab results.  Start your probiotic after you have finished taking your flagyl. Call me with an update.

## 2015-02-27 NOTE — Assessment & Plan Note (Signed)
Improving. Advised to continue to hold metformin until diarrhea has resolved. Finish course of flagyl, start probiotic once finished. She will call me with an update later this week. The patient indicates understanding of these issues and agrees with the plan.

## 2015-02-27 NOTE — Progress Notes (Signed)
Subjective:   Patient ID: Veronica Rogers, female    DOB: 04-04-1961, 54 y.o.   MRN: [id number]  Veronica Rogers is a pleasant 54 y.o. year old female who presents to clinic today with Follow-up  on 02/27/2015  HPI: DM- diagnosed with steroid induced hypergycemia last summer summer, a1c in August consistent with Diabetes. Taking Metformin 500 mg twice daily but has not taken since diarrhea started.  Sometimes forgets to take second dose- checks FSBS q am- ranging in 120s.   Lab Results  Component Value Date   CHOL 224* 11/22/2014   HDL 36.10* 11/22/2014   LDLCALC 111* 04/18/2011   LDLDIRECT 117.7 11/22/2014   TRIG 338.0* 11/22/2014   CHOLHDL 6 11/22/2014    Lab Results  Component Value Date   HGBA1C 7.9* 11/22/2014   Feels she is exercising more (joined [employer]- does cardio for one hour 3 days a week) and eating better. Wt Readings from Last 3 Encounters:  02/27/15 195 lb 8 oz (88.678 kg)  02/17/15 194 lb 4 oz (88.111 kg)  01/17/15 202 lb 3.2 oz (91.717 kg)    Diarrhea- saw Dr. Darnell Level on 2/19 for diarrhea- notes reviewed and labs/studies review. C diff and stool cx neg.  Symptoms had  improved with flagyl (started on 2/23), presumed infectious per his notes. Actually had a firm stool on Saturday but yesterday she had more foul smelling loose stools. Colonoscopy UTD_ 2011. No abdominal pain or blood in stool.   Current Outpatient Prescriptions on File Prior to Visit  Medication Sig Dispense Refill  . BIOTIN PO Take 1 tablet by mouth every morning.    . metFORMIN (GLUCOPHAGE XR) 500 MG 24 hr tablet Take 1 tablet (500 mg total) by mouth daily with breakfast. 30 tablet 3  . metroNIDAZOLE (FLAGYL) 500 MG tablet Take 1 tablet (500 mg total) by mouth 3 (three) times daily. 15 tablet 0  . pyridOXINE (VITAMIN B-6) 50 MG tablet Take 1 tablet (50 mg total) by mouth daily. 30 tablet 0  . sertraline (ZOLOFT) 50 MG tablet Take 50 mg by mouth every morning.     . tamoxifen  (NOLVADEX) 20 MG tablet Take 1 tablet (20 mg total) by mouth daily. 90 tablet 3   No current facility-administered medications on file prior to visit.    Allergies  Allergen Reactions  . Erythromycin Other (See Comments)    Stomach pain  . Tape Rash    Past Medical History  Diagnosis Date  . Breast cancer   . Anxiety   . Depression   . Contact lens/glasses fitting     wears contacts or glasses  . Radiation 03/28/14-05/06/14    Left Breast 60 Gy  . Diabetes 11/22/2014    Past Surgical History  Procedure Laterality Date  . Breast biopsy  1996    right breast, benign  . Wisdom tooth extraction    . Colonoscopy  05/2011    WNL  . Breast lumpectomy with needle localization and axillary sentinel lymph node bx Left 10/29/2013    Procedure: BREAST LUMPECTOMY WITH NEEDLE LOCALIZATION AND AXILLARY SENTINEL LYMPH NODE BX;  Surgeon: Rolm Bookbinder, MD;  Location: [facility];  Service: General;  Laterality: Left;    Family History  Problem Relation Age of Onset  . Squamous cell carcinoma Father   . Cancer Father   . Diabetes Father   . Hypertension Mother   . Diabetes Sister     History   Social History  .  Marital Status: Married    Spouse Name: N/A  . Number of Children: N/A  . Years of Education: N/A   Occupational History  . Not on file.   Social History Main Topics  . Smoking status: Never Smoker   . Smokeless tobacco: Never Used  . Alcohol Use: Yes     Comment: 1-2 drinks a month  . Drug Use: No  . Sexual Activity: Yes   Other Topics Concern  . Not on file   Social History Narrative   The PMH, PSH, Social History, Family History, Medications, and allergies have been reviewed in [facility], and have been updated if relevant.   Review of Systems  Constitutional: Negative.   HENT: Negative for congestion, ear discharge, ear pain, facial swelling and hearing loss.   Gastrointestinal: Positive for diarrhea. Negative for nausea, vomiting, blood in  stool, anal bleeding and rectal pain.  Endocrine: Negative.   Genitourinary: Negative.   Musculoskeletal: Negative for joint swelling and gait problem.  Psychiatric/Behavioral: Negative.   All other systems reviewed and are negative.      Objective:    BP 116/68 mmHg  Pulse 63  Temp(Src) 97.6 F (36.4 C) (Oral)  Wt 195 lb 8 oz (88.678 kg)  SpO2 99%   Physical Exam  Constitutional: She appears well-developed and well-nourished. No distress.  HENT:  Right Ear: Tympanic membrane normal.  Left Ear: Tympanic membrane normal.  Nose: Nose normal. No mucosal edema, rhinorrhea or sinus tenderness.  Eyes: Conjunctivae are normal.  Cardiovascular: Normal rate and regular rhythm.   Pulmonary/Chest: Effort normal.  Abdominal: Soft. Bowel sounds are normal. She exhibits no distension and no mass. There is no tenderness. There is no rebound and no guarding.  Musculoskeletal: Normal range of motion.       Right foot: Normal.       Left foot: Normal.  Skin: Skin is warm, dry and intact.  Psychiatric: She has a normal mood and affect. Her behavior is normal. Judgment and thought content normal.  Nursing note and vitals reviewed.         Assessment & Plan:   Infectious enteritis, unspecified infectious agent  Type 2 diabetes mellitus without complication No Follow-up on file.

## 2015-02-27 NOTE — Assessment & Plan Note (Signed)
Under better control. Recheck a1c today. She is anxious to wean down dose of Metformin. Lipids almost at goal for diabetic. Neg urine micro 10/2014.

## 2015-03-30 ENCOUNTER — Other Ambulatory Visit: Payer: Self-pay | Admitting: Internal Medicine

## 2015-03-30 ENCOUNTER — Other Ambulatory Visit: Payer: Self-pay | Admitting: Family Medicine

## 2015-05-11 ENCOUNTER — Encounter: Payer: Self-pay | Admitting: Family Medicine

## 2015-05-11 ENCOUNTER — Other Ambulatory Visit: Payer: Self-pay | Admitting: Family Medicine

## 2015-05-11 MED ORDER — METFORMIN HCL ER 750 MG PO TB24: 750.0000 mg | ORAL_TABLET | Freq: Every day | ORAL | Status: DC

## 2015-05-19 ENCOUNTER — Other Ambulatory Visit: Payer: Self-pay | Admitting: *Deleted

## 2015-05-19 MED ORDER — TAMOXIFEN CITRATE 20 MG PO TABS: 20.0000 mg | ORAL_TABLET | Freq: Every day | ORAL | Status: DC

## 2015-05-26 ENCOUNTER — Ambulatory Visit (INDEPENDENT_AMBULATORY_CARE_PROVIDER_SITE_OTHER): Payer: 59 | Admitting: Internal Medicine

## 2015-05-26 ENCOUNTER — Encounter: Payer: Self-pay | Admitting: Internal Medicine

## 2015-05-26 VITALS — BP 126/82 | HR 87 | Temp 98.5°F | Wt 201.0 lb

## 2015-05-26 DIAGNOSIS — M79672 Pain in left foot: Secondary | ICD-10-CM

## 2015-05-26 DIAGNOSIS — M25561 Pain in right knee: Secondary | ICD-10-CM

## 2015-05-26 DIAGNOSIS — S61209A Unspecified open wound of unspecified finger without damage to nail, initial encounter: Secondary | ICD-10-CM

## 2015-05-26 DIAGNOSIS — M7051 Other bursitis of knee, right knee: Secondary | ICD-10-CM | POA: Diagnosis not present

## 2015-05-26 NOTE — Patient Instructions (Signed)
Pes Anserinus Syndrome with Rehab The pes anserine, also known as the goose's foot, is an area of the shinbone (tibia) near the knee joint where the tendons of three of the muscles of the thigh insert into the bone. These muscles are important for bending the knee and bringing the leg across the body. Just underneath the three tendons that attach at the pes anserinus exists a fluid filled sac (bursa) that is meant to reduce the friction between the tendons and the tibia. Pes anserinus syndrome is a condition that is characterized by inflammation of the bursa (bursitis) and/ or tendonitis (inflammation of the tendon) and may cause severe pain in the lower portion of the inner (medial) side of the knee. SYMPTOMS   Pain and inflammation over the lower portion of the medial side of the knee.  Pain that worsens as the duration of an activity increases.  Pain that worsens when bending the knee, especially against resistance.  A crackling sound (crepitation) when the tendon or bursa is moved or touched. CAUSES  Bursitis and tendonitis are usually characterized as overuse injuries. Common mechanisms of injury include:  Stress placed on the knee from a sudden increase in the intensity, frequency, or duration of training.  Direct trauma to the upper leg (less common). RISK INCREASES WITH:  Endurance sports (distance running or triathletes).  Making changes to or beginning a new training program.  Sports that place stress on the muscles that insert at the pes anserinus, such as those that require pivoting, cutting, or jumping.  Improper training.  Poor strength and flexibility  Failure to warm-up properly before activity.  Improper knee alignment ( knock knees).  Arthritis of the knee. PREVENTION  Warm up and stretch properly before activity.  Allow for adequate recovery between workouts.  Maintain physical fitness:  Strength, flexibility, and endurance.  Cardiovascular  fitness.  Learn and use training methods that will reduce the stress placed on the pes anserinus.  Arch supports (orthotics) may be helpful for those with flat feet. PROGNOSIS  If treated properly, then the symptoms of pes anserinus syndrome usually resolve within 6 weeks.  RELATED COMPLICATIONS   Persistent and potentially chronic pain if the condition is not treated properly.  Re-injury if activity is resumed before the injury is allowed to heal completely, or if one resumes improper training habits. TREATMENT Treatment initially involves the use of ice and medication to help reduce pain and inflammation. The use of strengthening and stretching exercises may help reduce pain with activity. These exercises may be performed at home or with a therapist. Individuals who have flat feet may find benefit in wearing arch supports in their shoes. Some individuals find that compression bandages or knee sleeves help reduce symptoms. Your caregiver may recommend a corticosteroid injection to help reduce inflammation. If symptoms persist, despite conservative treatment for greater than 6 months, then surgery may be recommended.  MEDICATION   If pain medication is necessary, then nonsteroidal anti-inflammatory medications, such as aspirin and ibuprofen, or other minor pain relievers, such as acetaminophen, are often recommended.  Do not take pain medication for 7 days before surgery.  Prescription pain relievers may be given if deemed necessary by your caregiver. Use only as directed and only as much as you need.  Corticosteroid injections may be given by your caregiver. These injections should be reserved for the most serious cases, because they may only be given a certain number of times. SEEK MEDICAL CARE IF:  Treatment seems to offer no  benefit, or the condition worsens.  Any medications produce adverse side effects. EXERCISES  RANGE OF MOTION (ROM) AND STRETCHING EXERCISES - Pes Anserinus  Syndrome These exercises may help you when beginning to rehabilitate your injury. Your symptoms may resolve with or without further involvement from your physician, physical therapist or athletic trainer. While completing these exercises, remember:   Restoring tissue flexibility helps normal motion to return to the joints. This allows healthier, less painful movement and activity.  An effective stretch should be held for at least 30 seconds.  A stretch should never be painful. You should only feel a gentle lengthening or release in the stretched tissue. STRETCH - Hamstrings, Supine  Lie on your back. Loop a belt or towel over the ball of your right / left foot.  Straighten your right / left knee and slowly pull on the belt to raise your leg. Do not allow the right / left knee to bend. Keep your opposite leg flat on the floor.  Raise the leg until you feel a gentle stretch behind your right / left knee or thigh. Hold this position for __________ seconds. Repeat __________ times. Complete this stretch __________ times per day.  STRETCH - Hamstrings, Doorway  Lie on your back with your right / left leg extended and resting on the wall and the opposite leg flat on the ground through the door. Initially, position your bottom farther away from the wall than the illustration shows.  Keep your right / left knee straight. If you feel a stretch behind your knee or thigh, hold this position for __________ seconds.  If you do not feel a stretch, scoot your bottom closer to the door, and hold __________ seconds. Repeat __________ times. Complete this stretch __________ times per day.  STRETCH - Hamstrings/Adductors, V-Sit  Sit on the floor with your legs extended in a large "V," keeping your knees straight.  With your head and chest upright, bend at your waist reaching for your right foot to stretch your left adductors.  You should feel a stretch in your left inner thigh. Hold for __________  seconds.  Return to the upright position to relax your leg muscles.  Continuing to keep your chest upright, bend straight forward at your waist to stretch your hamstrings.  You should feel a stretch behind both of your thighs and/or knees. Hold for __________ seconds.  Return to the upright position to relax your leg muscles.  Repeat steps 2 through 4. Repeat __________ times. Complete this exercise __________ times per day.  STRETCH - Hamstrings, Standing  Stand or sit and extend your right / left leg, placing your foot on a chair or foot stool  Keeping a slight arch in your low back and your hips straight forward.  Lead with your chest and lean forward at the waist until you feel a gentle stretch in the back of your right / left knee or thigh. (When done correctly, this exercise requires leaning only a small distance.)  Hold this position for __________ seconds. Repeat __________ times. Complete this stretch __________ times per day. STRETCH - Adductors, Lunge  While standing, spread your legs  Lean away from your right / left leg by bending your opposite knee. You may rest your hands on your thigh for balance.  You should feel a stretch in your right / left inner thigh. Hold for __________ seconds. Repeat __________ times. Complete this exercise __________ times per day.  STRETCH - Adductors, Standing  Place your right /   left foot on a counter or stable table. Turn away from your leg so both hips line up with your right / left leg.  Keeping your hips facing forward, slowly bend your opposite leg until you feel a gentle stretch on the inside of your right / left thigh.  Hold for __________ seconds. Repeat __________ times. Complete this exercise __________ times per day.  STRENGTHENING EXERCISES - Pes Anserinus Syndrome  These exercises may help you when beginning to rehabilitate your injury. They may resolve your symptoms with or without further involvement from your  physician, physical therapist or athletic trainer. While completing these exercises, remember:   Muscles can gain both the endurance and the strength needed for everyday activities through controlled exercises.  Complete these exercises as instructed by your physician, physical therapist or athletic trainer. Progress the resistance and repetitions only as guided. STRENGTH - Hamstring, Curls  Lay on your stomach with your legs extended. (If you lay on a bed, your feet may hang over the edge.)  Tighten the muscles in the back of your thigh to bend your right / left knee up to 90 degrees. Keep your hips flat on the bed/floor.  Hold this position for __________ seconds.  Slowly lower your leg back to the starting position. Repeat __________ times. Complete this exercise __________ times per day.  OPTIONAL ANKLE WEIGHTS: Begin with ____________________, but DO NOT exceed ____________________. Increase in 1 lb/0.5 kg increments.  STRENGTH - Hip Adductors, Straight Leg Raises  Lie on your side so that your head, shoulders, knee and hip line up. You may place your upper foot in front to help maintain your balance. Your right / left leg should be on the bottom.  Roll your hips slightly forward, so that your hips are stacked directly over each other and your right / left knee is facing forward.  Tense the muscles in your inner thigh and lift your bottom leg 4-6 inches. Hold this position for __________ seconds.  Slowly lower your leg to the starting position. Allow the muscles to fully relax before beginning the next repetition. Repeat __________ times. Complete this exercise __________ times per day.  Document Released: 12/16/2005 Document Revised: 03/09/2012 Document Reviewed: 03/30/2009 [facility] Patient Information 2015 [place], [place]. This information is not intended to replace advice given to you by your health care provider. Make sure you discuss any questions you have with your health care  provider.

## 2015-05-26 NOTE — Progress Notes (Signed)
Pre visit review using our clinic review tool, if applicable. No additional management support is needed unless otherwise documented below in the visit note. 

## 2015-05-26 NOTE — Progress Notes (Signed)
Subjective:    Patient ID: Veronica Rogers, female    DOB: 1961/12/06, 54 y.o.   MRN: [id number]  HPI  Pt presents to the clinic today with multiple complaints.  1- She c/o a sore to the pinky finger on her right hand. This occurred 5 days ago when her ring dug into her pinky finger. The sore has drained clear fulid. She is concerned because of the redness around it. The area is tender to touch. She has washed it with warm soap and water and put A&D ointment on it. She does think it is getting better but she is concerned because she has DM.  2-She also reports right knee pain. This has been intermittent over the last year. She reports it feels stiff and sore. She has noticed some swelling of her knee. She denies any injury to the area. She has seen an orthopedist in the past for this. They offered her injection into the knee but she did not want to do it. She takes Advil intermittently with good relief.  3- She c/o pain in her left heel. This has been intermittent as well over the last few months. She reports Dr. Deborra Medina told her was plantar fasciitis. She does not believe this is an accurate diagnosis. She reports the pain is not in the bottom of her foot. She also feels like it hurts all day long not just in the morning. She has tried the rehab for plantar fasciitis without any relief. She denies any injury to the left heel. She would like it reevaluated today but reports she is not having the pain right now.  Review of Systems      Past Medical History  Diagnosis Date  . Breast cancer   . Anxiety   . Depression   . Contact lens/glasses fitting     wears contacts or glasses  . Radiation 03/28/14-05/06/14    Left Breast 60 Gy  . Diabetes 11/22/2014    Current Outpatient Prescriptions  Medication Sig Dispense Refill  . BIOTIN PO Take 1 tablet by mouth every morning.    . metFORMIN (GLUCOPHAGE-XR) 750 MG 24 hr tablet Take 1 tablet (750 mg total) by mouth daily with breakfast. 30 tablet 3   . Multiple Vitamin (MULTIVITAMIN) tablet Take 1 tablet by mouth daily.    Marland Kitchen pyridOXINE (VITAMIN B-6) 50 MG tablet Take 1 tablet (50 mg total) by mouth daily. 30 tablet 0  . sertraline (ZOLOFT) 50 MG tablet Take 50 mg by mouth every morning.     . tamoxifen (NOLVADEX) 20 MG tablet Take 1 tablet (20 mg total) by mouth daily. 90 tablet 3  . TRUETEST TEST test strip TEST BLOOD GLUCOSE ONCE EACH MORNING 50 each 11   No current facility-administered medications for this visit.    Allergies  Allergen Reactions  . Erythromycin Other (See Comments)    Stomach pain  . Tape Rash    Family History  Problem Relation Age of Onset  . Squamous cell carcinoma Father   . Cancer Father   . Diabetes Father   . Hypertension Mother   . Diabetes Sister     History   Social History  . Marital Status: Married    Spouse Name: N/A  . Number of Children: N/A  . Years of Education: N/A   Occupational History  . Not on file.   Social History Main Topics  . Smoking status: Never Smoker   . Smokeless tobacco: Never Used  . Alcohol  Use: Yes     Comment: 1-2 drinks a month  . Drug Use: No  . Sexual Activity: Yes   Other Topics Concern  . Not on file   Social History Narrative     Constitutional: Denies fever, malaise, fatigue, headache or abrupt weight changes.  Respiratory: Denies difficulty breathing, shortness of breath, cough or sputum production.   Cardiovascular: Denies chest pain, chest tightness, palpitations or swelling in the hands or feet.  Musculoskeletal: Pt reports right knee pain, swelling and left heel pain. Denies decrease in range of motion, difficulty with gait, muscle pain.  Skin: Pt reports sore to right pinky. Denies redness, rashes,or ulcercations.  Neurological: Denies dizziness, difficulty with memory, difficulty with speech or problems with balance and coordination.   No other specific complaints in a complete review of systems (except as listed in HPI  above).  Objective:   Physical Exam  BP 126/82 mmHg  Pulse 87  Temp(Src) 98.5 F (36.9 C) (Oral)  Wt 201 lb (91.173 kg)  SpO2 97% Wt Readings from Last 3 Encounters:  05/26/15 201 lb (91.173 kg)  02/27/15 195 lb 8 oz (88.678 kg)  02/17/15 194 lb 4 oz (88.111 kg)    General: Appears her stated age, well developed, well nourished in NAD. Skin: Small scabbed lesion to medial right pinky finger. No drainage noted. Small amount of redness around the lesion, but no s/s of cellulitis noted. Cardiovascular: Normal rate and rhythm. S1,S2 noted.  No murmur, rubs or gallops noted.  Pulmonary/Chest: Normal effort and positive vesicular breath sounds. No respiratory distress. No wheezes, rales or ronchi noted.  Musculoskeletal: Normal flexion and extension of the right knee. No crepitus noted with ROM. Swelling noted over the later pes bursa. Pain with palpation of the pes bursa. Pain with palpation over the lateral joint line. No pinpoint tenderness with the left lateral heel. No pain with palpation of the plantar aspect of the foot. Neurological: Alert and oriented.   BMET    Component Value Date/Time   NA 143 02/27/2015 0851   NA 141 01/17/2015 1404   K 3.1* 02/27/2015 0851   K 4.1 01/17/2015 1404   CL 107 02/27/2015 0851   CO2 27 02/27/2015 0851   CO2 28 01/17/2015 1404   GLUCOSE 163* 02/27/2015 0851   GLUCOSE 123 01/17/2015 1404   BUN 11 02/27/2015 0851   BUN 16.9 01/17/2015 1404   CREATININE 0.70 02/27/2015 0851   CREATININE 0.7 01/17/2015 1404   CALCIUM 9.0 02/27/2015 0851   CALCIUM 9.3 01/17/2015 1404    Lipid Panel     Component Value Date/Time   CHOL 124 02/27/2015 0851   TRIG 160.0* 02/27/2015 0851   HDL 28.70* 02/27/2015 0851   CHOLHDL 4 02/27/2015 0851   VLDL 32.0 02/27/2015 0851   LDLCALC 63 02/27/2015 0851    CBC    Component Value Date/Time   WBC 6.5 01/17/2015 1404   WBC 7.7 03/08/2014 1340   RBC 4.18 01/17/2015 1404   RBC 3.81* 03/08/2014 1340   HGB  11.6 01/17/2015 1404   HGB 10.5* 03/08/2014 1340   HCT 35.8 01/17/2015 1404   HCT 33.2* 03/08/2014 1340   PLT 227 01/17/2015 1404   PLT 274 03/08/2014 1340   MCV 85.6 01/17/2015 1404   MCV 87.1 03/08/2014 1340   MCH 27.8 01/17/2015 1404   MCH 27.6 03/08/2014 1340   MCHC 32.4 01/17/2015 1404   MCHC 31.6 03/08/2014 1340   RDW 13.2 01/17/2015 1404   RDW 17.5*  03/08/2014 1340   LYMPHSABS 2.5 01/17/2015 1404   LYMPHSABS 3.1 03/08/2014 1340   MONOABS 0.3 01/17/2015 1404   MONOABS 0.6 03/08/2014 1340   EOSABS 0.1 01/17/2015 1404   EOSABS 0.2 03/08/2014 1340   BASOSABS 0.0 01/17/2015 1404   BASOSABS 0.1 03/08/2014 1340    Hgb A1C Lab Results  Component Value Date   HGBA1C 7.9* 02/27/2015         Assessment & Plan:   Open wound to right pinky finger:  Appears to be healing normally No s/s of infection Apply neosporin to affected area and cover with a bandaid Watch for increased redness, purulent drainage, fever or chills  Right knee pain secondary to pes bursitis:  She would like to consider injection at this time but does not want to go all the way back to her orthopedic doctor Advised her apply ice and continue Advil prn She will make an appt with Dr. Lorelei Pont for further evaluation  Left heel pain:  Exam normal today It does not sound like plantar fasciitis She will followup if pain returns  RTC as needed or if symptoms persist or worsen

## 2015-06-06 ENCOUNTER — Ambulatory Visit (INDEPENDENT_AMBULATORY_CARE_PROVIDER_SITE_OTHER): Payer: 59 | Admitting: Family Medicine

## 2015-06-06 ENCOUNTER — Encounter: Payer: Self-pay | Admitting: Family Medicine

## 2015-06-06 VITALS — BP 132/80 | HR 83 | Temp 97.6°F | Ht 67.25 in | Wt 202.2 lb

## 2015-06-06 DIAGNOSIS — M722 Plantar fascial fibromatosis: Secondary | ICD-10-CM | POA: Diagnosis not present

## 2015-06-06 DIAGNOSIS — M25561 Pain in right knee: Secondary | ICD-10-CM

## 2015-06-06 DIAGNOSIS — M1711 Unilateral primary osteoarthritis, right knee: Secondary | ICD-10-CM | POA: Diagnosis not present

## 2015-06-06 MED ORDER — METHYLPREDNISOLONE ACETATE 40 MG/ML IJ SUSP
80.0000 mg | Freq: Once | INTRAMUSCULAR | Status: AC
  Administered 2015-06-06: 80 mg via INTRA_ARTICULAR

## 2015-06-06 NOTE — Progress Notes (Signed)
Dr. Frederico Hamman T. Emil Weigold, MD, [facility] [place], [address] Phone: [phone] Fax: [phone]  06/06/2015  Patient: Veronica Rogers, MRN: [id number], DOB: 1961/02/20, 54 y.o.  Primary Physician:  Arnette Norris, MD  Chief Complaint: Knee Pain and Foot Pain  Subjective:   Veronica Rogers is a 54 y.o. very pleasant female patient who presents with the following:  R knee pain, had some swelling off and on for a couple of years. Had chemo a year ago. Took off of mobic for cancer. This was subsequently discontinued. She has been having some pain off and on and some significant crepitus in the right knee for a few years. She previously saw Dr. Marlou Sa, and he put her on some anti-inflammatory not recurred injection, which at that point she did not do. She now has some intermittent swelling off and on and has pain and crepitus with movement.  The patient presents with a 3 month long history of heel pain. This is notable for worsening pain first thing in the morning when arising and standing after sitting.   Prior foot or ankle fractures: none Prior operations: none Orthotics or bracing: none PT or home rehab: home stretching Night splints: no Ice massage: y Chapin massage: no  Metatarsal pain: no   Past Medical History, Surgical History, Social History, Family History, Problem List, Medications, and Allergies have been reviewed and updated if relevant.  GEN: No fevers, chills. Nontoxic. Primarily MSK c/o today. MSK: Detailed in the HPI GI: tolerating PO intake without difficulty Neuro: No numbness, parasthesias, or tingling associated. Otherwise the pertinent positives of the ROS are noted above.   Objective:   BP 132/80 mmHg  Pulse 83  Temp(Src) 97.6 F (36.4 C) (Oral)  Ht 5' 7.25" (1.708 m)  Wt 202 lb 4 oz (91.74 kg)  BMI 31.45 kg/m2   GEN: WDWN, NAD, Non-toxic, Alert & Oriented x 3 HEENT: Atraumatic, Normocephalic.   Ears and Nose: No external deformity. EXTR: No clubbing/cyanosis/edema PSYCH: Normally interactive. Conversant. Not depressed or anxious appearing.  Calm demeanor.   Knee:  R Gait: Normal heel toe pattern ROM: lacks 2 deg ext, 115 flexion Effusion: mild Echymosis or edema: none Patellar tendon NT Painful PLICA: neg Patellar grind: negative Medial and lateral patellar facet loading: negative medial and lateral joint lines: lateral joint line pain CREPITUS Mcmurray's pain Flexion-pinch pain Varus and valgus stress: stable Lachman: neg Ant and Post drawer: neg Hip abduction, IR, ER: WNL Hip flexion str: 5/5 Hip abd: 5/5 Quad: 5/5 VMO atrophy:No Hamstring concentric and eccentric: 5/5   Radiology: No results found.  Assessment and Plan:   Primary osteoarthritis of right knee  Right knee pain - Plan: methylPREDNISolone acetate (DEPO-MEDROL) injection 80 mg  Plantar fasciitis, left  Clinically consistent with osteoarthritis of the right knee with crepitus and loss of motion in flexion and extension. For now, we will try to calm her down her mild flare with the corticosteroid injection. Assuming her primary care provider approves, NSAIDs would seem to be appropriate in this case also for long-term use.  Plantar fasciitis is minimal, recommended continued stretching and basic care. At this point, the patient does not have any symptomatic pes bursitis.  Knee Injection, R Patient verbally consented to procedure. Risks (including potential rare risk of infection), benefits, and alternatives explained. Sterilely prepped with Chloraprep. Ethyl cholride used for anesthesia. 8 cc Lidocaine 1% mixed with Depo-Medrol 80 mg injected using the anteromedial  approach without difficulty. No complications with procedure and tolerated well. Patient had decreased pain post-injection.   Follow-up: prn  Signed,  Ritvik Mczeal T. Scheryl Sanborn, MD   Patient's Medications  New Prescriptions   No  medications on file  Previous Medications   BIOTIN PO    Take 1 tablet by mouth every morning.   METFORMIN (GLUCOPHAGE-XR) 750 MG 24 HR TABLET    Take 1 tablet (750 mg total) by mouth daily with breakfast.   MULTIPLE VITAMIN (MULTIVITAMIN) TABLET    Take 1 tablet by mouth daily.   PROBIOTIC PRODUCT (PROBIOTIC PO)    Take 1 capsule by mouth daily.   SERTRALINE (ZOLOFT) 50 MG TABLET    Take 50 mg by mouth every morning.    TAMOXIFEN (NOLVADEX) 20 MG TABLET    Take 1 tablet (20 mg total) by mouth daily.   TRUETEST TEST TEST STRIP    TEST BLOOD GLUCOSE ONCE EACH MORNING  Modified Medications   No medications on file  Discontinued Medications   PYRIDOXINE (VITAMIN B-6) 50 MG TABLET    Take 1 tablet (50 mg total) by mouth daily.

## 2015-06-06 NOTE — Progress Notes (Signed)
Pre visit review using our clinic review tool, if applicable. No additional management support is needed unless otherwise documented below in the visit note. 

## 2015-06-13 ENCOUNTER — Encounter: Payer: Self-pay | Admitting: Hematology and Oncology

## 2015-06-13 NOTE — Progress Notes (Signed)
I placed fmla form on desk of nurse for dr. Lindi Adie

## 2015-06-19 ENCOUNTER — Telehealth: Payer: Self-pay

## 2015-06-19 NOTE — Telephone Encounter (Signed)
FMLA form returned to managed care

## 2015-06-20 ENCOUNTER — Encounter: Payer: Self-pay | Admitting: Hematology and Oncology

## 2015-06-20 NOTE — Progress Notes (Signed)
I faxed fmla forms [phone]

## 2015-06-27 ENCOUNTER — Encounter: Payer: 59 | Attending: Family Medicine

## 2015-06-27 VITALS — Ht 68.0 in | Wt 201.5 lb

## 2015-06-27 DIAGNOSIS — E119 Type 2 diabetes mellitus without complications: Secondary | ICD-10-CM | POA: Insufficient documentation

## 2015-06-27 DIAGNOSIS — Z713 Dietary counseling and surveillance: Secondary | ICD-10-CM | POA: Diagnosis not present

## 2015-06-27 NOTE — Progress Notes (Signed)

## 2015-07-04 ENCOUNTER — Ambulatory Visit: Payer: 59 | Admitting: *Deleted

## 2015-07-04 ENCOUNTER — Encounter: Payer: 59 | Attending: Family Medicine

## 2015-07-04 DIAGNOSIS — Z713 Dietary counseling and surveillance: Secondary | ICD-10-CM | POA: Insufficient documentation

## 2015-07-04 DIAGNOSIS — E119 Type 2 diabetes mellitus without complications: Secondary | ICD-10-CM | POA: Insufficient documentation

## 2015-07-04 NOTE — Progress Notes (Signed)
Patient was seen on 07/04/15 for the second of a series of three diabetes self-management courses at the [facility]. The following learning objectives were met by the patient during this class:   Describe the role of different macronutrients on glucose  Explain how carbohydrates affect blood glucose  State what foods contain the most carbohydrates  Demonstrate carbohydrate counting  Demonstrate how to read Nutrition Facts food label  Describe effects of various fats on heart health  Describe the importance of good nutrition for health and healthy eating strategies  Describe techniques for managing your shopping, cooking and meal planning  List strategies to follow meal plan when dining out  Describe the effects of alcohol on glucose and how to use it safely  Goals:  Follow Diabetes Meal Plan as instructed  Eat 3 meals and 2 snacks, every 3-5 hrs  Aim for carbohydrate intake of 45 grams carbohydrate/meal Aim for carbohydrate intake of 0-15 grams carbohydrate/snack Add lean protein foods to meals/snacks  Monitor glucose levels as instructed by your doctor   Follow-Up Plan:  Attend Core 3  Work towards following your personal food plan.  

## 2015-07-05 ENCOUNTER — Ambulatory Visit (INDEPENDENT_AMBULATORY_CARE_PROVIDER_SITE_OTHER): Payer: 59 | Admitting: Internal Medicine

## 2015-07-05 ENCOUNTER — Encounter: Payer: Self-pay | Admitting: Internal Medicine

## 2015-07-05 VITALS — BP 150/90 | HR 87 | Temp 97.5°F | Wt 201.0 lb

## 2015-07-05 DIAGNOSIS — J069 Acute upper respiratory infection, unspecified: Secondary | ICD-10-CM | POA: Insufficient documentation

## 2015-07-05 MED ORDER — HYDROCODONE-HOMATROPINE 5-1.5 MG/5ML PO SYRP: 5.0000 mL | ORAL_SOLUTION | Freq: Two times a day (BID) | ORAL | Status: DC | PRN

## 2015-07-05 NOTE — Assessment & Plan Note (Signed)
Seems viral ?sinus drainage causing severe cough Continue the advil prn Rx cough syrup so she can work

## 2015-07-05 NOTE — Progress Notes (Signed)
Pre visit review using our clinic review tool, if applicable. No additional management support is needed unless otherwise documented below in the visit note. 

## 2015-07-05 NOTE — Progress Notes (Signed)
Subjective:    Patient ID: Veronica Rogers, female    DOB: 09/04/1961, 54 y.o.   MRN: [id number]  HPI Here due to respiratory illness  Having a deep chest cough Starting to cause pain in throat Draining at night--clear sputum Cough day and night but especially bad at night Started 6-7 days ago--first just fatigued and then nausea (?from drainage), then the cough started Was swimming in pool this weekend  No fever Not SOB Some left frontal headache Left ear pain-- a little Sore throat at first--resolved quickly (took left over amoxil)  OTC cough syrup-- some help advil also--for headache  Current Outpatient Prescriptions on File Prior to Visit  Medication Sig Dispense Refill  . BIOTIN PO Take 1 tablet by mouth every morning.    . metFORMIN (GLUCOPHAGE-XR) 750 MG 24 hr tablet Take 1 tablet (750 mg total) by mouth daily with breakfast. 30 tablet 3  . Multiple Vitamin (MULTIVITAMIN) tablet Take 1 tablet by mouth daily.    . Probiotic Product (PROBIOTIC PO) Take 1 capsule by mouth daily.    . sertraline (ZOLOFT) 50 MG tablet Take 50 mg by mouth every morning.     . tamoxifen (NOLVADEX) 20 MG tablet Take 1 tablet (20 mg total) by mouth daily. 90 tablet 3  . TRUETEST TEST test strip TEST BLOOD GLUCOSE ONCE EACH MORNING 50 each 11   No current facility-administered medications on file prior to visit.    Allergies  Allergen Reactions  . Erythromycin Other (See Comments)    Stomach pain  . Tape Rash    Past Medical History  Diagnosis Date  . Breast cancer   . Anxiety   . Depression   . Contact lens/glasses fitting     wears contacts or glasses  . Radiation 03/28/14-05/06/14    Left Breast 60 Gy  . Diabetes 11/22/2014  . Hyperlipidemia     Past Surgical History  Procedure Laterality Date  . Breast biopsy  1996    right breast, benign  . Wisdom tooth extraction    . Colonoscopy  05/2011    WNL  . Breast lumpectomy with needle localization and axillary sentinel lymph  node bx Left 10/29/2013    Procedure: BREAST LUMPECTOMY WITH NEEDLE LOCALIZATION AND AXILLARY SENTINEL LYMPH NODE BX;  Surgeon: Rolm Bookbinder, MD;  Location: [facility];  Service: General;  Laterality: Left;    Family History  Problem Relation Age of Onset  . Squamous cell carcinoma Father   . Cancer Father   . Diabetes Father   . Hypertension Mother   . Diabetes Sister     History   Social History  . Marital Status: Married    Spouse Name: N/A  . Number of Children: N/A  . Years of Education: N/A   Occupational History  . Not on file.   Social History Main Topics  . Smoking status: Never Smoker   . Smokeless tobacco: Never Used  . Alcohol Use: Yes     Comment: 1-2 drinks a month  . Drug Use: No  . Sexual Activity: Yes   Other Topics Concern  . Not on file   Social History Narrative   Review of Systems No ill exposures No rash No vomiting or diarrhea--but felt "gaggy" Appetite is still okay    Objective:   Physical Exam  Constitutional: She appears well-developed and well-nourished. No distress.  HENT:  No sinus tenderness Moderate nasal swelling Very slight pharyngeal injection without exudate TMs normal  Neck: Normal range of motion. Neck supple. No thyromegaly present.  Pulmonary/Chest: Effort normal and breath sounds normal. No respiratory distress. She has no wheezes. She has no rales.  Lymphadenopathy:    She has no cervical adenopathy.          Assessment & Plan:

## 2015-07-05 NOTE — Patient Instructions (Signed)
Please call next week if your symptoms aren't improved

## 2015-07-07 ENCOUNTER — Encounter: Payer: Self-pay | Admitting: Hematology and Oncology

## 2015-07-07 NOTE — Progress Notes (Signed)
I faxed fmla forms to [phone]

## 2015-07-11 DIAGNOSIS — E119 Type 2 diabetes mellitus without complications: Secondary | ICD-10-CM

## 2015-07-11 NOTE — Progress Notes (Signed)
Patient was seen on 07/11/15 for the third of a series of three diabetes self-management courses at the [facility].   Catalina Gravel the amount of activity recommended for healthy living . Describe activities suitable for individual needs . Identify ways to regularly incorporate activity into daily life . Identify barriers to activity and ways to over come these barriers  Identify diabetes medications being personally used and their primary action for lowering glucose and possible side effects . Describe role of stress on blood glucose and develop strategies to address psychosocial issues . Identify diabetes complications and ways to prevent them  Explain how to manage diabetes during illness . Evaluate success in meeting personal goal . Establish 2-3 goals that they will plan to diligently work on until they return for the  3-month follow-up visit  Goals:   I will be active 30 minutes or more 3 times a week  I will eat less unhealthy fats by eating less trans fats and fried foods like chips  Your patient has identified these potential barriers to change:  Motivation  Your patient has identified their diabetes self-care support plan as  On-line Resources Plan:  Attend Core 4 in 4 months

## 2015-07-13 ENCOUNTER — Telehealth: Payer: Self-pay | Admitting: Family Medicine

## 2015-07-13 MED ORDER — AMOXICILLIN 500 MG PO TABS: 1000.0000 mg | ORAL_TABLET | Freq: Two times a day (BID) | ORAL | Status: DC

## 2015-07-13 NOTE — Telephone Encounter (Signed)
Pt called to let you know her symptoms are not any better can you call  something in for her   [facility]

## 2015-07-13 NOTE — Telephone Encounter (Signed)
Please let her know I sent the order for the antibiotic

## 2015-07-13 NOTE — Telephone Encounter (Signed)
Pt notified Rx sent to pharmacy

## 2015-07-25 ENCOUNTER — Telehealth: Payer: Self-pay | Admitting: Hematology and Oncology

## 2015-07-25 ENCOUNTER — Encounter: Payer: Self-pay | Admitting: Hematology and Oncology

## 2015-07-25 ENCOUNTER — Ambulatory Visit (HOSPITAL_BASED_OUTPATIENT_CLINIC_OR_DEPARTMENT_OTHER): Payer: 59 | Admitting: Hematology and Oncology

## 2015-07-25 ENCOUNTER — Telehealth: Payer: Self-pay

## 2015-07-25 VITALS — BP 192/94 | HR 115 | Temp 98.3°F | Resp 18 | Ht 68.0 in | Wt 202.5 lb

## 2015-07-25 DIAGNOSIS — Z853 Personal history of malignant neoplasm of breast: Secondary | ICD-10-CM | POA: Diagnosis not present

## 2015-07-25 DIAGNOSIS — C50212 Malignant neoplasm of upper-inner quadrant of left female breast: Secondary | ICD-10-CM

## 2015-07-25 DIAGNOSIS — B354 Tinea corporis: Secondary | ICD-10-CM

## 2015-07-25 MED ORDER — ITRACONAZOLE 100 MG PO CAPS: 200.0000 mg | ORAL_CAPSULE | Freq: Every day | ORAL | Status: DC

## 2015-07-25 NOTE — Telephone Encounter (Signed)
Appointments made and avs printed for patient °

## 2015-07-25 NOTE — Assessment & Plan Note (Signed)
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 Occasional hot flashes : I discussed different measures including yoga exercise as well as medications including Effexor and Neurontin. she is already on Zoloft , I recommended her to continue with the same for now.  Breast cancer surveillance: 1. Mammogram done January 2016 is normal 2. Breast exam 07/25/2015 is normal  Return to clinic in 6 months for follow up

## 2015-07-25 NOTE — Progress Notes (Signed)
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 She has hot flashes but tamoxifen but she is able to manage it recently well. She complains of a rash on her breasts that started about 3 weeks ago and it appears to be spreading rapidly. She also has a same spot on the thigh.  REVIEW OF SYSTEMS:   Constitutional: Denies fevers, chills or abnormal weight loss Eyes: Denies blurriness of vision Ears, nose, mouth, throat, and face: Denies mucositis or sore throat Respiratory: Denies cough, dyspnea or wheezes Cardiovascular: Denies palpitation, chest discomfort or lower extremity swelling Gastrointestinal:  Denies nausea, heartburn or change in bowel habits Skin: rash on both of his left greater than right Lymphatics: Denies new lymphadenopathy or easy bruising Neurological:Denies numbness, tingling or new  weaknesses Behavioral/Psych: Mood is stable, no new changes  Breast: rash on the breast All other systems were reviewed with the patient and are negative.  I have reviewed the past medical history, past surgical history, social history and family history with the patient and they are unchanged from previous note.  ALLERGIES:  is allergic to erythromycin and tape.  MEDICATIONS:  Current Outpatient Prescriptions  Medication Sig Dispense Refill  . BIOTIN PO Take 1 tablet by mouth every morning.    . metFORMIN (GLUCOPHAGE-XR) 750 MG 24 hr tablet Take 1 tablet (750 mg total) by mouth daily with breakfast. 30 tablet 3  . Multiple Vitamin (MULTIVITAMIN) tablet Take 1 tablet by mouth daily.    . Probiotic Product (PROBIOTIC PO) Take 1 capsule by mouth daily.    . sertraline (ZOLOFT) 50 MG tablet Take 50 mg by mouth every morning.     . tamoxifen (NOLVADEX) 20 MG tablet Take 1 tablet (20 mg total) by mouth daily. 90 tablet 3  . TRUETEST TEST test strip TEST BLOOD GLUCOSE ONCE EACH MORNING 50 each 11  . amoxicillin (AMOXIL) 500 MG tablet Take 2 tablets (1,000 mg total) by mouth 2 (two) times daily. (Patient not taking: Reported on 07/25/2015) 40 tablet 0  . HYDROcodone-homatropine (HYCODAN) 5-1.5 MG/5ML syrup Take 5 mLs by mouth 2 (two) times daily as needed for cough. (Patient not taking: Reported on 07/25/2015) 240 mL 0   No current facility-administered medications for this visit.    PHYSICAL EXAMINATION: ECOG PERFORMANCE STATUS: 1 - Symptomatic but completely ambulatory  Filed Vitals:   07/25/15 1534  BP: 192/94  Pulse: 115  Temp: 98.3 F (36.8 C)  Resp: 18   Filed Weights   07/25/15 1534  Weight: 202 lb 8 oz (91.853 kg)    GENERAL:alert, no distress and comfortable SKIN: skin color, texture, turgor are normal, no rashes or significant lesions EYES: normal, Conjunctiva are pink and non-injected, sclera clear OROPHARYNX:no exudate, no erythema and lips, buccal mucosa, and tongue  normal  NECK: supple, thyroid normal size, non-tender, without nodularity LYMPH:  no palpable lymphadenopathy in the cervical, axillary or inguinal LUNGS: clear to auscultation and percussion with normal breathing effort HEART: regular rate & rhythm and no murmurs and no lower extremity edema ABDOMEN:abdomen soft, non-tender and normal bowel sounds Musculoskeletal:no cyanosis of digits and no clubbing  NEURO: alert & oriented x 3 with fluent speech, no focal motor/sensory deficits BREAST:multiple ring shaped lesions that are spread throughout the breast some onto the right breast as well.. (exam performed in the presence of a chaperone)  LABORATORY DATA:  I have reviewed the data as listed   Chemistry      Component Value Date/Time   NA 143 02/27/2015 0851   NA 141 01/17/2015 1404   K 3.1* 02/27/2015 0851   K 4.1 01/17/2015 1404   CL 107 02/27/2015 0851   CO2 27 02/27/2015 0851   CO2 28 01/17/2015 1404   BUN 11 02/27/2015 0851   BUN 16.9 01/17/2015 1404   CREATININE 0.70 02/27/2015 0851   CREATININE 0.7 01/17/2015 1404      Component Value Date/Time   CALCIUM 9.0 02/27/2015 0851   CALCIUM 9.3 01/17/2015 1404   ALKPHOS 39 02/27/2015 0851   ALKPHOS 43 01/17/2015 1404   AST 19 02/27/2015 0851   AST 16 01/17/2015 1404   ALT 19 02/27/2015 0851   ALT 17 01/17/2015 1404   BILITOT 0.3 02/27/2015 0851   BILITOT 0.28 01/17/2015 1404       Lab Results  Component Value Date   WBC 6.5 01/17/2015   HGB 11.6 01/17/2015   HCT 35.8 01/17/2015   MCV 85.6 01/17/2015   PLT 227 01/17/2015   NEUTROABS 3.5 01/17/2015   ASSESSMENT & PLAN:  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 Occasional hot flashes : I discussed different measures including yoga exercise as well as medications including Effexor and Neurontin. she is already on Zoloft , I recommended her to continue with the same for now.  Breast cancer surveillance: 1. Mammogram done January 2016 is normal 2. Breast exam 07/25/2015 shows ring worm infection on both breasts worse on left breast as well as on thighs  Ring worm infection: failed topical therapy. Prescribed itraconazole 200 mg daily X 7 days Patient has been instructed to call or let is know how she feels after 1 week. Return to clinic in 6 months for follow up   No orders of the defined types were placed in this encounter.   The patient has a good understanding of the overall plan. she agrees with it. she will call with any problems that may develop before the next visit here.   Rulon Eisenmenger, MD

## 2015-09-15 ENCOUNTER — Other Ambulatory Visit: Payer: Self-pay | Admitting: Family Medicine

## 2015-10-06 ENCOUNTER — Telehealth: Payer: Self-pay

## 2015-10-25 ENCOUNTER — Telehealth: Payer: Self-pay

## 2015-11-06 ENCOUNTER — Ambulatory Visit (INDEPENDENT_AMBULATORY_CARE_PROVIDER_SITE_OTHER): Payer: 59 | Admitting: Family Medicine

## 2015-11-06 ENCOUNTER — Ambulatory Visit (INDEPENDENT_AMBULATORY_CARE_PROVIDER_SITE_OTHER)
Admission: RE | Admit: 2015-11-06 | Discharge: 2015-11-06 | Disposition: A | Discharge status: Home / Self Care | Payer: 59 | Source: Ambulatory Visit | Attending: Family Medicine | Admitting: Family Medicine

## 2015-11-06 ENCOUNTER — Encounter: Payer: Self-pay | Admitting: Family Medicine

## 2015-11-06 VITALS — BP 140/82 | HR 78 | Temp 98.3°F | Ht 68.0 in | Wt 201.0 lb

## 2015-11-06 DIAGNOSIS — M25561 Pain in right knee: Secondary | ICD-10-CM | POA: Diagnosis not present

## 2015-11-06 DIAGNOSIS — M25562 Pain in left knee: Secondary | ICD-10-CM | POA: Diagnosis not present

## 2015-11-06 MED ORDER — MELOXICAM 15 MG PO TABS: 15.0000 mg | ORAL_TABLET | Freq: Every day | ORAL | Status: DC

## 2015-11-06 MED ORDER — DICLOFENAC SODIUM 1.5 % TD SOLN: TRANSDERMAL | Status: DC

## 2015-11-06 NOTE — Progress Notes (Signed)
Pre visit review using our clinic review tool, if applicable. No additional management support is needed unless otherwise documented below in the visit note. 

## 2015-11-06 NOTE — Progress Notes (Signed)
Dr. Frederico Hamman T. Shylee Durrett, MD, [facility] [place], [address] Phone: [phone] Fax: [phone]  11/06/2015  Patient: Veronica Rogers, MRN: [id number], DOB: 05/03/1961, 54 y.o.  Primary Physician:  Arnette Norris, MD   Chief Complaint  Patient presents with  . Knee Pain    Bilateral but mostly Left Knee   Subjective:   Veronica Rogers is a 54 y.o. very pleasant female patient who presents with the following:  [place] Sunday a week ago, and pain on her left knee. Some R sided knee pain.  Ongoing pain since Tuesday.  Has been elevated it, alleve, wrapped it also.   She has had a mild effusion, but no significant bruising.  She has been taking anti-inflammatories and did some compression and icing.  She still is having some pain, and some pain with deep flexion.  She also has pain with rotational movements.  Past Medical History, Surgical History, Social History, Family History, Problem List, Medications, and Allergies have been reviewed and updated if relevant.  Patient Active Problem List   Diagnosis Date Noted  . Tinea corporis 07/25/2015  . Viral upper respiratory infection 07/05/2015  . Infectious enteritis 02/17/2015  . Diabetes ([place]) 11/22/2014  . Plantar fascia syndrome 11/22/2014  . Allergic rhinitis 11/22/2014  . Steroid-induced hyperglycemia 01/04/2014  . 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[place]) 10/14/2013  . Back pain 04/25/2011  . Elevated blood pressure (not hypertension) 04/18/2011  . Routine general medical examination at a health care facility 04/18/2011    Past Medical History  Diagnosis Date  . Breast cancer ([place])   . Anxiety   . Depression   . Contact lens/glasses fitting     wears contacts or glasses  . Radiation 03/28/14-05/06/14    Left Breast 60 Gy  . Diabetes ([place]) 11/22/2014  . Hyperlipidemia     Past Surgical History  Procedure Laterality Date  . Breast  biopsy  1996    right breast, benign  . Wisdom tooth extraction    . Colonoscopy  05/2011    WNL  . Breast lumpectomy with needle localization and axillary sentinel lymph node bx Left 10/29/2013    Procedure: BREAST LUMPECTOMY WITH NEEDLE LOCALIZATION AND AXILLARY SENTINEL LYMPH NODE BX;  Surgeon: Rolm Bookbinder, MD;  Location: [facility];  Service: General;  Laterality: Left;    Social History   Social History  . Marital Status: Married    Spouse Name: N/A  . Number of Children: N/A  . Years of Education: N/A   Occupational History  . Not on file.   Social History Main Topics  . Smoking status: Never Smoker   . Smokeless tobacco: Never Used  . Alcohol Use: Yes     Comment: 1-2 drinks a month  . Drug Use: No  . Sexual Activity: Yes   Other Topics Concern  . Not on file   Social History Narrative    Family History  Problem Relation Age of Onset  . Squamous cell carcinoma Father   . Cancer Father   . Diabetes Father   . Hypertension Mother   . Diabetes Sister     Allergies  Allergen Reactions  . Erythromycin Other (See Comments)    Stomach pain  . Tape Rash    Medication list reviewed and updated in full in [facility].  GEN: No fevers, chills. Nontoxic. Primarily MSK c/o today. MSK: Detailed in the  HPI GI: tolerating PO intake without difficulty Neuro: No numbness, parasthesias, or tingling associated. Otherwise the pertinent positives of the ROS are noted above.   Objective:   BP 140/82 mmHg  Pulse 78  Temp(Src) 98.3 F (36.8 C) (Oral)  Ht 5\' 8"  (1.727 m)  Wt 201 lb (91.173 kg)  BMI 30.57 kg/m2   GEN: WDWN, NAD, Non-toxic, Alert & Oriented x 3 HEENT: Atraumatic, Normocephalic.  Ears and Nose: No external deformity. EXTR: No clubbing/cyanosis/edema NEURO: Normal gait.  PSYCH: Normally interactive. Conversant. Not depressed or anxious appearing.  Calm demeanor.   Knee:  L Gait: Normal heel toe pattern ROM:  0-120 Effusion: mild on L Echymosis or edema: none Patellar tendon NT Painful PLICA: neg Patellar grind: negative Medial and lateral patellar facet loading: negative medial and lateral joint lines: mild TTP Mcmurray's neg Flexion-pinch neg Varus and valgus stress: stable Lachman: neg Ant and Post drawer: neg Hip abduction, IR, ER: WNL Hip flexion str: 5/5 Hip abd: 5/5 Quad: 5/5 VMO atrophy:No Hamstring concentric and eccentric: 5/5    Radiology: Dg Knee Ap/lat W/sunrise Left  11/06/2015  CLINICAL DATA:  Fall.  Pain.  Initial evaluation. EXAM: LEFT KNEE 3 VIEWS COMPARISON:  None. FINDINGS: Tricompartment degenerative change. No acute bony abnormality. Small knee joint effusion. IMPRESSION: Left knee joint effusion. Tricompartment degenerative change. No acute bony abnormality. Electronically Signed   By: Marcello Moores  Register   On: 11/06/2015 12:56    Assessment and Plan:   Left knee pain - Plan: DG Knee AP/LAT W/Sunrise Left  Right knee pain  The name is reasonably well, she does have a mild effusion, and she did have a direct trauma.  We talked about different treatment options, and the patient wishes to be conservative this point, so we will place her in a patellar-J brace.  Review going to use some oral and topical anti-inflammatories as well as bracing, and ice.  Reassess in one month.  Follow-up: Return in about 1 month (around 12/06/2015).  New Prescriptions   DICLOFENAC SODIUM 1.5 % SOLN    Apply QID to knee   MELOXICAM (MOBIC) 15 MG TABLET    Take 1 tablet (15 mg total) by mouth daily.   Modified Medications   No medications on file   Orders Placed This Encounter  Procedures  . DG Knee AP/LAT W/Sunrise Left    Signed,  Purcell Jungbluth T. Johnathan Tortorelli, MD   Patient's Medications  New Prescriptions   DICLOFENAC SODIUM 1.5 % SOLN    Apply QID to knee   MELOXICAM (MOBIC) 15 MG TABLET    Take 1 tablet (15 mg total) by mouth daily.  Previous Medications   BIOTIN PO    Take 1  tablet by mouth every morning.   METFORMIN (GLUCOPHAGE-XR) 750 MG 24 HR TABLET    TAKE 1 TABLET BY MOUTH DAILY WITH BREAKFAST.   MULTIPLE VITAMIN (MULTIVITAMIN) TABLET    Take 1 tablet by mouth daily.   PROBIOTIC PRODUCT (PROBIOTIC PO)    Take 1 capsule by mouth daily.   SERTRALINE (ZOLOFT) 50 MG TABLET    Take 50 mg by mouth every morning.    TAMOXIFEN (NOLVADEX) 20 MG TABLET    Take 1 tablet (20 mg total) by mouth daily.   TRUETEST TEST TEST STRIP    TEST BLOOD GLUCOSE ONCE EACH MORNING  Modified Medications   No medications on file  Discontinued Medications   AMOXICILLIN (AMOXIL) 500 MG TABLET    Take 2 tablets (1,000 mg total) by  mouth 2 (two) times daily.   HYDROCODONE-HOMATROPINE (HYCODAN) 5-1.5 MG/5ML SYRUP    Take 5 mLs by mouth 2 (two) times daily as needed for cough.   ITRACONAZOLE (SPORANOX) 100 MG CAPSULE    Take 2 capsules (200 mg total) by mouth daily.

## 2015-11-09 ENCOUNTER — Ambulatory Visit: Payer: 59 | Admitting: Family Medicine

## 2015-11-13 ENCOUNTER — Other Ambulatory Visit: Payer: Self-pay | Admitting: Hematology and Oncology

## 2015-11-13 ENCOUNTER — Other Ambulatory Visit: Payer: Self-pay

## 2015-11-13 DIAGNOSIS — Z853 Personal history of malignant neoplasm of breast: Secondary | ICD-10-CM

## 2015-11-13 DIAGNOSIS — Z9889 Other specified postprocedural states: Secondary | ICD-10-CM

## 2015-11-20 ENCOUNTER — Telehealth: Payer: Self-pay | Admitting: Family Medicine

## 2015-11-20 NOTE — Telephone Encounter (Signed)
When i last saw her, i would not have termed her pain "very bad" - if she is worse, then I would come back in earlier to recheck.  If the same, then i would give more time - she is scheduled to see me in a couple of weeks.

## 2015-11-20 NOTE — Telephone Encounter (Signed)
Called stating her knee She can keep swelling down with meds and the brace but it is still very painful She wanted to know  Should she wait another week to see if the pain goes away or try something different? Please advise

## 2015-11-20 NOTE — Telephone Encounter (Signed)
Ms. Broussard notified as instructed by telephone.  She states the pain is about the same so she will continue with the Diclofenac Gel, Knee Brace and Mobic.  She will plan on following up as scheduled on 12/11/2015.

## 2015-11-29 ENCOUNTER — Encounter: Payer: 59 | Attending: Family Medicine

## 2015-11-29 DIAGNOSIS — E119 Type 2 diabetes mellitus without complications: Secondary | ICD-10-CM | POA: Insufficient documentation

## 2015-11-29 DIAGNOSIS — Z713 Dietary counseling and surveillance: Secondary | ICD-10-CM | POA: Insufficient documentation

## 2015-11-30 ENCOUNTER — Other Ambulatory Visit: Payer: Self-pay

## 2015-11-30 VITALS — BP 138/88 | HR 72 | Resp 14 | Ht 68.0 in | Wt 202.0 lb

## 2015-11-30 DIAGNOSIS — E119 Type 2 diabetes mellitus without complications: Secondary | ICD-10-CM

## 2015-11-30 LAB — POCT GLYCOSYLATED HEMOGLOBIN (HGB A1C): HEMOGLOBIN A1C: 8.3

## 2015-11-30 NOTE — Progress Notes (Signed)
Appt start time: 1730 end time:  1830.  Patient was seen on 11/29/2015 for a review of the series of three diabetes self-management courses at [facility]. The following learning objectives were met by the patient during this class:  . Reviewed blood glucose monitoring and interpretation including the recommended target ranges and Hgb A1c.  . Reviewed on carb counting, importance of regularly scheduled meals/snacks, and meal planning.  . Reviewed the effects of physical activity on glucose levels and long-term glucose control.  Recommended goal of 150 minutes of physical activity/week. . Reviewed patient medications and discussed role of medication on blood glucose and possible side effects. . Discussed strategies to manage stress, psychosocial issues, and other obstacles to diabetes management. . Encouraged moderate weight reduction to improve glucose levels.   . Reviewed short-term complications: hyper- and hypo-glycemia.  Discussed causes, symptoms, and treatment options. . Reviewed prevention, detection, and treatment of long-term complications.  Discussed the role of prolonged elevated glucose levels on body systems.  Goals:  Follow Diabetes Meal Plan as instructed  Eat 3 meals and 2 snacks, every 3-5 hrs  Limit carbohydrate intake to 45 grams carbohydrate/meal Limit carbohydrate intake to 15 grams carbohydrate/snack Add lean protein foods to meals/snacks  Monitor glucose levels as instructed by your doctor  Aim for goal of 15-30 mins of physical activity daily as tolerated  Bring food record and glucose log to your next nutrition visit

## 2015-11-30 NOTE — Patient Outreach (Signed)
[facility]) Care Management   11/30/2015  Veronica Rogers 1961-01-11 [id number]  Veronica Rogers is an 54 y.o. female.   Member seen for follow up office visit for Link to Wellness program for self management of Type 2 diabetes  Subjective: Member states that she completed the diabetes classes yesterday.  States that she learned a lot about diabetes.  States she would like to get better control of her sugars and be healthier.  States her husband has diabetes and they both need to improve their health.  States she has not seen her primary care provider for her diabetes since February.  States she has been seeing Dr.Copland for knee pain that has effected her exercise ability.  States she does try to get on the exercise bike at work during her lunch time for 15 minutes when she can.  States that she has a Higher education careers adviser to [employer] and she plan to go back there when her knee allows.  States that her blood sugars usually run 150-200 in the AM.  Objective:   ROS POC Hemoglobin A1C-8.3% Physical Exam  Today's Vitals   11/30/15 1611  BP: 138/88  Pulse: 72  Resp: 14  Height: 1.727 m (5\' 8" )  Weight: 202 lb (91.627 kg)  SpO2: 97%  PainSc: 0-No pain    Current Medications:   Current Outpatient Prescriptions  Medication Sig Dispense Refill  . BIOTIN PO Take 1 tablet by mouth every morning.    . Diclofenac Sodium 1.5 % SOLN Apply QID to knee 1 Bottle prn  . meloxicam (MOBIC) 15 MG tablet Take 1 tablet (15 mg total) by mouth daily. 30 tablet 3  . metFORMIN (GLUCOPHAGE-XR) 750 MG 24 hr tablet TAKE 1 TABLET BY MOUTH DAILY WITH BREAKFAST. 30 tablet 5  . Multiple Vitamin (MULTIVITAMIN) tablet Take 1 tablet by mouth daily.    . Probiotic Product (PROBIOTIC PO) Take 1 capsule by mouth daily.    . sertraline (ZOLOFT) 50 MG tablet Take 50 mg by mouth every morning.     . tamoxifen (NOLVADEX) 20 MG tablet Take 1 tablet (20 mg total) by mouth daily. 90 tablet 3  . TRUETEST TEST  test strip TEST BLOOD GLUCOSE ONCE EACH MORNING 50 each 11   No current facility-administered medications for this visit.    Functional Status:   In your present state of health, do you have any difficulty performing the following activities: 11/30/2015  Hearing? N  Vision? N  Difficulty concentrating or making decisions? N  Dressing or bathing? N  Doing errands, shopping? N    Fall/Depression Screening:    PHQ 2/9 Scores 06/27/2015  PHQ - 2 Score 0    Assessment:   Member seen for initial office visit for Link to Wellness program for self management of Type 2 diabetes.  Member is not meeting diabetes self management goal of 7% with reading of 8.3% today.  Member reports watching the portions of her CHO.  She does exercise at work but has been less active due to knee pain.  Member completed [facility].  Member reports taking her Metformin without difficulty since changed to extended release.  Member overdue for follow up with primary care for her diabetes.  Plan:  1. Plan to eat 30-45 GM (2-3) servings of carbohydrate a meal and 15 GM for snacks.  Plan to eat protein with your snacks 2. Plan to check blood sugar 3 times a week either fasting  or 1 -2hrs after a meal.  Goals of 80-130 fasting and 180 or less after eating. 3. Plan to ride exercise machine 15 minutes 3 times a week.  Plan to walk 30 minutes over the weekend.  Plan to start back at the gym when knee allows. 4. Plan to complete EMMI by 02/28/16 5. Plan to call Dr. Deborra Medina to schedule appointment next week. 6. Plan to return to Link to Wellness on 01/26/16 at [facility] Problem One        Most Recent Value   Care Plan Problem One  Elevated blood sugars related to dx of Type 2 DM   Role Documenting the Problem One  Care Management [place] for Problem One  Active   [facility] Long Term Goal Start Date  11/30/15   Interventions for Problem One Long Term Goal  Given  Link to Wellness diabetes teaching packet and reviewed program requirements, instructed pharmacy benefits will be activated as she has completed required classes, Reviewed CHO counting and portion control, Instructed on blood sugar goals and target hemoglobin A1C level, Instructed on importance of regular exercise to glycemic control, Instructed to call Dr.Aron to schedule follow up visit for diabetes and that she might need more medication to help her reach her blood sugar goals, Instruced on how to access the EMMI programs    Peter Garter RN, [facility] Care Management Coordinator-Link to [place] Management [phone]

## 2015-12-01 NOTE — Patient Instructions (Signed)
1. Plan to eat 30-45 GM (2-3) servings of carbohydrate a meal and 15 GM for snacks.  Plan to eat protein with your snacks 2. Plan to check blood sugar 3 times a week either fasting or 1 -2hrs after a meal.  Goals of 80-130 fasting and 180 or less after eating. 3. Plan to ride exercise machine 15 minutes 3 times a week.  Plan to walk 30 minutes over the weekend.  Plan to start back at the gym when knee allows. 4. Plan to complete EMMI by 02/28/16 5. Plan to call Dr. Deborra Medina to schedule appointment next week. 6. Plan to return to Link to Wellness on 01/26/16 at [facility]

## 2015-12-07 ENCOUNTER — Other Ambulatory Visit: Payer: Self-pay | Admitting: Family Medicine

## 2015-12-07 DIAGNOSIS — E118 Type 2 diabetes mellitus with unspecified complications: Secondary | ICD-10-CM

## 2015-12-11 ENCOUNTER — Ambulatory Visit: Payer: 59 | Admitting: Family Medicine

## 2015-12-13 ENCOUNTER — Ambulatory Visit: Payer: 59 | Admitting: Family Medicine

## 2015-12-21 ENCOUNTER — Other Ambulatory Visit: Payer: Self-pay | Admitting: Hematology and Oncology

## 2015-12-21 ENCOUNTER — Ambulatory Visit
Admission: RE | Admit: 2015-12-21 | Discharge: 2015-12-21 | Disposition: A | Discharge status: Home / Self Care | Payer: 59 | Source: Ambulatory Visit | Attending: Hematology and Oncology | Admitting: Hematology and Oncology

## 2015-12-21 DIAGNOSIS — Z853 Personal history of malignant neoplasm of breast: Secondary | ICD-10-CM

## 2015-12-21 DIAGNOSIS — Z9889 Other specified postprocedural states: Secondary | ICD-10-CM

## 2016-01-02 ENCOUNTER — Other Ambulatory Visit: Payer: Self-pay | Admitting: Family Medicine

## 2016-01-02 DIAGNOSIS — I6389 Other cerebral infarction: Secondary | ICD-10-CM

## 2016-01-05 ENCOUNTER — Ambulatory Visit (INDEPENDENT_AMBULATORY_CARE_PROVIDER_SITE_OTHER): Payer: 59 | Admitting: Internal Medicine

## 2016-01-05 ENCOUNTER — Encounter: Payer: Self-pay | Admitting: Internal Medicine

## 2016-01-05 VITALS — BP 118/72 | HR 85 | Temp 97.9°F | Resp 12 | Ht 68.0 in | Wt 200.8 lb

## 2016-01-05 DIAGNOSIS — E1165 Type 2 diabetes mellitus with hyperglycemia: Secondary | ICD-10-CM | POA: Diagnosis not present

## 2016-01-05 MED ORDER — METFORMIN HCL ER 750 MG PO TB24: 750.0000 mg | ORAL_TABLET | Freq: Two times a day (BID) | ORAL | Status: DC

## 2016-01-05 NOTE — Patient Instructions (Addendum)
Please increase Metformin to 750 mg 2x a day. If you tolerate this well, in few days, move the entire dose (2 tablets) with dinner.  Please let me know if the sugars are consistently <80 or >200.  Check sugars 1-2x a day, rotating checks.  Please return in 1.5 months with your sugar log.   PATIENT INSTRUCTIONS FOR TYPE 2 DIABETES:  **Please join MyChart!** - see attached instructions about how to join if you have not done so already.  DIET AND EXERCISE Diet and exercise is an important part of diabetic treatment.  We recommended aerobic exercise in the form of brisk walking (working between 40-60% of maximal aerobic capacity, similar to brisk walking) for 150 minutes per week (such as 30 minutes five days per week) along with 3 times per week performing 'resistance' training (using various gauge rubber tubes with handles) 5-10 exercises involving the major muscle groups (upper body, lower body and core) performing 10-15 repetitions (or near fatigue) each exercise. Start at half the above goal but build slowly to reach the above goals. If limited by weight, joint pain, or disability, we recommend daily walking in a swimming pool with water up to waist to reduce pressure from joints while allow for adequate exercise.    BLOOD GLUCOSES Monitoring your blood glucoses is important for continued management of your diabetes. Please check your blood glucoses 2-4 times a day: fasting, before meals and at bedtime (you can rotate these measurements - e.g. one day check before the 3 meals, the next day check before 2 of the meals and before bedtime, etc.).   HYPOGLYCEMIA (low blood sugar) Hypoglycemia is usually a reaction to not eating, exercising, or taking too much insulin/ other diabetes drugs.  Symptoms include tremors, sweating, hunger, confusion, headache, etc. Treat IMMEDIATELY with 15 grams of Carbs: . 4 glucose tablets .  cup regular juice/soda . 2 tablespoons raisins . 4 teaspoons  sugar . 1 tablespoon honey Recheck blood glucose in 15 mins and repeat above if still symptomatic/blood glucose <100.  RECOMMENDATIONS TO REDUCE YOUR RISK OF DIABETIC COMPLICATIONS: * Take your prescribed MEDICATION(S) * Follow a DIABETIC diet: Complex carbs, fiber rich foods, (monounsaturated and polyunsaturated) fats * AVOID saturated/trans fats, high fat foods, >2,300 mg salt per day. * EXERCISE at least 5 times a week for 30 minutes or preferably daily.  * DO NOT SMOKE OR DRINK more than 1 drink a day. * Check your FEET every day. Do not wear tightfitting shoes. Contact us if you develop an ulcer * See your EYE doctor once a year or more if needed * Get a FLU shot once a year * Get a PNEUMONIA vaccine once before and once after age 52 years  GOALS:  * Your Hemoglobin A1c of <7%  * fasting sugars need to be <130 * after meals sugars need to be <180 (2h after you start eating) * Your Systolic BP should be [id number] or lower  * Your Diastolic BP should be 80 or lower  * Your HDL (Good Cholesterol) should be 40 or higher  * Your LDL (Bad Cholesterol) should be 100 or lower. * Your Triglycerides should be 150 or lower  * Your Urine microalbumin (kidney function) should be <30 * Your Body Mass Index should be 25 or lower    Please consider the following ways to cut down carbs and fat and increase fiber and micronutrients in your diet: - substitute whole grain for white bread or pasta - substitute brown rice  for white rice - substitute 90-calorie flat bread pieces for slices of bread when possible - substitute sweet potatoes or yams for white potatoes - substitute humus for margarine - substitute tofu for cheese when possible - substitute almond or rice milk for regular milk (would not drink soy milk daily due to concern for soy estrogen influence on breast cancer risk) - substitute dark chocolate for other sweets when possible - substitute water - can add lemon or orange slices for taste  - for diet sodas (artificial sweeteners will trick your body that you can eat sweets without getting calories and will lead you to overeating and weight gain in the long run) - do not skip breakfast or other meals (this will slow down the metabolism and will result in more weight gain over time)  - can try smoothies made from fruit and almond/rice milk in am instead of regular breakfast - can also try old-fashioned (not instant) oatmeal made with almond/rice milk in am - order the dressing on the side when eating salad at a restaurant (pour less than half of the dressing on the salad) - eat as little meat as possible - can try juicing, but should not forget that juicing will get rid of the fiber, so would alternate with eating raw veg./fruits or drinking smoothies - use as little oil as possible, even when using olive oil - can dress a salad with a mix of balsamic vinegar and lemon juice, for e.g. - use agave nectar, stevia sugar, or regular sugar rather than artificial sweateners - steam or broil/roast veggies  - snack on veggies/fruit/nuts (unsalted, preferably) when possible, rather than processed foods - reduce or eliminate aspartame in diet (it is in diet sodas, chewing gum, etc) Read the labels!  Try to read Dr. Janene Harvey book: "Program for Reversing Diabetes" for other ideas for healthy eating.

## 2016-01-05 NOTE — Progress Notes (Signed)
Patient ID: Veronica Rogers, female   DOB: [birth year], 55 y.o.   MRN: [id number]   HPI: Veronica Rogers is a 55 y.o.-year-old female, referred by her PCP, Dr.Aron, for management of DM2, dx in 2015 (after steroids), non-insulin-dependent, uncontrolled, without complications.  Last hemoglobin A1c was: Lab Results  Component Value Date   HGBA1C 8.3 11/30/2015   HGBA1C 7.9* 02/27/2015   HGBA1C 7.9* 11/22/2014   Pt is on a regimen of: - Metformin ER 750 mg in am She was on insulin during ChTx. She was on regular metformin >> GI upset (diarrhea).  Pt checks her sugars 1x a day and they are: - am: 131, 142-179 - 2h after b'fast: 227 (cereal) - before lunch: n/c - 2h after lunch: n/c - before dinner: n/c - 2h after dinner: n/c - bedtime: n/c - nighttime: n/c No lows. Lowest sugar was 131; she has hypoglycemia awareness at 70.  Highest sugar was 274.  Glucometer: True test  Pt's meals are: - Breakfast: cereals or 2 eggs or skips - 10:30 am: PB crackers - Lunch: leftovers from home: sandwich; soup; meat + veggies; chinese; salad - Dinner: same as lunch - Snacks: [employer] - 2x a week.  - no CKD, last BUN/creatinine:  Lab Results  Component Value Date   BUN 11 02/27/2015   CREATININE 0.70 02/27/2015   - last set of lipids: Lab Results  Component Value Date   CHOL 124 02/27/2015   HDL 28.70* 02/27/2015   LDLCALC 63 02/27/2015   LDLDIRECT 117.7 11/22/2014   TRIG 160.0* 02/27/2015   CHOLHDL 4 02/27/2015   - last eye exam was in 12/2014. No DR. Dr Bing Plume. - no numbness and tingling in her feet. Had plantar fasciitis. Has some numbness in L heel.  Pt has FH of DM in father, brother and sister.  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  ROS: Constitutional: no weight gain/loss, no fatigue, no subjective hyperthermia/hypothermia, + nocturia 2x a night Eyes: no blurry vision, no xerophthalmia ENT: no sore throat, no nodules palpated in throat, no dysphagia/odynophagia, no  hoarseness Cardiovascular: no CP/SOB/palpitations/leg swelling Respiratory: no cough/SOB Gastrointestinal: no N/V/D/C Musculoskeletal: no muscle/joint aches Skin: no rashes Neurological: no tremors/numbness/tingling/dizziness Psychiatric: no depression/anxiety  Past Medical History  Diagnosis Date  . Breast cancer ([place])   . Anxiety   . Depression   . Contact lens/glasses fitting     wears contacts or glasses  . Radiation 03/28/14-05/06/14    Left Breast 60 Gy  . Diabetes ([place]) 11/22/2014  . Hyperlipidemia    Past Surgical History  Procedure Laterality Date  . Breast biopsy  1996    right breast, benign  . Wisdom tooth extraction    . Colonoscopy  05/2011    WNL  . Breast lumpectomy with needle localization and axillary sentinel lymph node bx Left 10/29/2013    Procedure: BREAST LUMPECTOMY WITH NEEDLE LOCALIZATION AND AXILLARY SENTINEL LYMPH NODE BX;  Surgeon: Rolm Bookbinder, MD;  Location: [facility];  Service: General;  Laterality: Left;   Social History   Social History  . Marital Status: Married    Spouse Name: N/A  . Number of Children: 2   Occupational History  . [employer] - support rep   Social History Main Topics  . Smoking status: Never Smoker   . Smokeless tobacco: Never Used  . Alcohol Use: Yes     Comment: 1-2 drinks a month  . Drug Use: No   Current Outpatient Prescriptions on  File Prior to Visit  Medication Sig Dispense Refill  . BIOTIN PO Take 1 tablet by mouth every morning.    . Diclofenac Sodium 1.5 % SOLN Apply QID to knee 1 Bottle prn  . meloxicam (MOBIC) 15 MG tablet Take 1 tablet (15 mg total) by mouth daily. 30 tablet 3  . Multiple Vitamin (MULTIVITAMIN) tablet Take 1 tablet by mouth daily.    . sertraline (ZOLOFT) 50 MG tablet Take 50 mg by mouth every morning.     . tamoxifen (NOLVADEX) 20 MG tablet Take 1 tablet (20 mg total) by mouth daily. 90 tablet 3  . TRUETEST TEST test strip TEST BLOOD GLUCOSE  ONCE EACH MORNING 50 each 11  . Probiotic Product (PROBIOTIC PO) Take 1 capsule by mouth daily. Reported on 01/05/2016     No current facility-administered medications on file prior to visit.   Allergies  Allergen Reactions  . Erythromycin Other (See Comments)    Stomach pain  . Tape Rash   Family History  Problem Relation Age of Onset  . Squamous cell carcinoma Father   . Cancer Father   . Diabetes Father   . Hypertension Mother   . Diabetes Sister    PE: BP 118/72 mmHg  Pulse 85  Temp(Src) 97.9 F (36.6 C) (Oral)  Resp 12  Ht '5\' 8"'$  (1.727 m)  Wt 200 lb 12.8 oz (91.082 kg)  BMI 30.54 kg/m2  SpO2 98% Wt Readings from Last 3 Encounters:  01/05/16 200 lb 12.8 oz (91.082 kg)  11/30/15 202 lb (91.627 kg)  11/06/15 201 lb (91.173 kg)   Constitutional: overweight, in NAD Eyes: PERRLA, EOMI, no exophthalmos ENT: moist mucous membranes, no thyromegaly, no cervical lymphadenopathy Cardiovascular: RRR, No MRG Respiratory: CTA B Gastrointestinal: abdomen soft, NT, ND, BS+ Musculoskeletal: no deformities, strength intact in all 4 Skin: moist, warm, no rashes Neurological: no tremor with outstretched hands, DTR normal in all 4  ASSESSMENT: 1. DM2, non-insulin-dependent, uncontrolled, without complications  PLAN:  1. Patient with long-standing, uncontrolled diabetes, on low dose Metformin, which became insufficient. She is not checking sugars except in am >> advised to rotate checks. For now, will increase the Metformin ER to 1500 mg daily. We may need to add a DPP4 inh at next visit, but would like to see how her sugars improve on a higher Metformin dose first. - I suggested to:  Patient Instructions  Please increase Metformin to 750 mg 2x a day. If you tolerate this well, in few days, move the entire dose (2 tablets) with dinner.  Please let me know if the sugars are consistently <80 or >200.  Check sugars 1-2x a day, rotating checks.  Please return in 1.5 months with your  sugar log.   - Strongly advised her to start checking sugars at different times of the day - check 1-2 times a day, rotating checks - given sugar log and advised how to fill it and to bring it at next appt  - given foot care handout and explained the principles  - given instructions for hypoglycemia management "15-15 rule"  - advised for yearly eye exams >> she is UTD - Return to clinic in 1.5 mo with sugar log

## 2016-01-08 DIAGNOSIS — E1165 Type 2 diabetes mellitus with hyperglycemia: Secondary | ICD-10-CM | POA: Insufficient documentation

## 2016-01-26 ENCOUNTER — Telehealth: Payer: Self-pay | Admitting: Hematology and Oncology

## 2016-01-26 ENCOUNTER — Other Ambulatory Visit: Payer: Self-pay

## 2016-01-26 ENCOUNTER — Ambulatory Visit (HOSPITAL_BASED_OUTPATIENT_CLINIC_OR_DEPARTMENT_OTHER): Payer: 59 | Admitting: Hematology and Oncology

## 2016-01-26 ENCOUNTER — Encounter: Payer: Self-pay | Admitting: Hematology and Oncology

## 2016-01-26 VITALS — BP 120/76 | HR 72 | Resp 16 | Ht 68.0 in | Wt 198.2 lb

## 2016-01-26 VITALS — BP 134/75 | HR 83 | Temp 97.7°F | Resp 18 | Ht 68.0 in | Wt 197.9 lb

## 2016-01-26 DIAGNOSIS — N951 Menopausal and female climacteric states: Secondary | ICD-10-CM | POA: Diagnosis not present

## 2016-01-26 DIAGNOSIS — E119 Type 2 diabetes mellitus without complications: Secondary | ICD-10-CM

## 2016-01-26 DIAGNOSIS — C50212 Malignant neoplasm of upper-inner quadrant of left female breast: Secondary | ICD-10-CM | POA: Diagnosis not present

## 2016-01-26 NOTE — Assessment & Plan Note (Signed)
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 Occasional hot flashes : I discussed different measures including yoga exercise as well as medications including Effexor and Neurontin. she is already on Zoloft , I recommended her to continue with the same for now.  Breast cancer surveillance: 1. Mammogram done January 2016 is normal 2. Breast exam 07/25/2015 shows ring worm infection on both breasts worse on left breast as well as on thighs  Ring worm infection: failed topical therapy. Prescribed itraconazole 200 mg daily X 7 days  Return to clinic in 6 months for follow up and after that we will see her once a year

## 2016-01-26 NOTE — Patient Instructions (Signed)
1. Plan to eat 30-45 GM (2-3) servings of carbohydrate a meal and 15 GM for snacks.  Plan to eat protein with your snacks 2. Plan to check blood sugar 1-2 times a day either fasting or 1 -2hrs after a meal.  Goals of 80-130 fasting and 180 or less after eating. 3. Plan to go to gym 3 days a week 60 minutes 3 times a week.  Plan to walk 30 minutes over the weekend.   4. Plan to complete EMMI by 02/28/16 5. Plan to see Dr. Cruzita Lederer on 02/23/16 6. Plan to return to [facility] on 03/21/16 at [facility]

## 2016-01-26 NOTE — Patient Outreach (Signed)
[facility]) Care Management   01/26/2016  Veronica Rogers 1961/05/12 [id number]  Veronica Rogers is an 55 y.o. female.   Member seen for follow up office visit for [facility] program for self management of Type 2 diabetes  Subjective: Member states that she saw Dr. Deborra Medina last month and she sent her to see Dr.Gherghe.  States she saw the endocrinologist on 01/05/16 and she increased her Metformin.  States she is to see her again on 02/23/16.  States she wants her to check her blood sugars at different times.  States she has been able to go back to the gym 3 times a week and she is there about 1 hour.  States she is trying to watch her diet better.    Objective:   Review of Systems  All other systems reviewed and are negative. Reviewed glucometer 7 day average-162 14 day average-148 30 day average-156  Physical Exam Today's Vitals   01/26/16 1028  BP: 120/76  Pulse: 72  Resp: 16  Height: 1.727 m (5\' 8" )  Weight: 198 lb 3.2 oz (89.903 kg)  SpO2: 97%  PainSc: 0-No pain   Current Medications:   Current Outpatient Prescriptions  Medication Sig Dispense Refill  . BIOTIN PO Take 1 tablet by mouth every morning.    . meloxicam (MOBIC) 15 MG tablet Take 1 tablet (15 mg total) by mouth daily. 30 tablet 3  . metFORMIN (GLUCOPHAGE-XR) 750 MG 24 hr tablet Take 1 tablet (750 mg total) by mouth 2 (two) times daily with a meal. 60 tablet 2  . Multiple Vitamin (MULTIVITAMIN) tablet Take 1 tablet by mouth daily.    . Probiotic Product (PROBIOTIC PO) Take 1 capsule by mouth daily. Reported on 01/05/2016    . sertraline (ZOLOFT) 50 MG tablet Take 50 mg by mouth every morning.     . tamoxifen (NOLVADEX) 20 MG tablet Take 1 tablet (20 mg total) by mouth daily. 90 tablet 3  . TRUETEST TEST test strip TEST BLOOD GLUCOSE ONCE EACH MORNING 50 each 11  . Diclofenac Sodium 1.5 % SOLN Apply QID to knee 1 Bottle prn   No current facility-administered medications for this visit.     Functional Status:   In your present state of health, do you have any difficulty performing the following activities: 01/26/2016 11/30/2015  Hearing? N N  Vision? N N  Difficulty concentrating or making decisions? N N  Walking or climbing stairs? N -  Dressing or bathing? N N  Doing errands, shopping? N N    Fall/Depression Screening:    PHQ 2/9 Scores 01/26/2016 11/30/2015 06/27/2015  PHQ - 2 Score 0 0 0    Assessment:   Member seen for follow up office visit for [facility] program for self management of Type 2 diabetes.  Member is above diabetes self management goal of 7% with last reading of 8.3 on 12/07/15.  Member had Metformin changed to extended release and dosage increased.  She will follow up with endocrinologist on 02/23/16.  CBGs are starting to be decreased in AM with ranges of 132-170 in AM.  Member has restarted going to gym 3 times a week and walking on weekends.  Plan:   Plan to eat 30-45 GM (2-3) servings of carbohydrate a meal and 15 GM for snacks.  Plan to eat protein with your snacks Plan to check blood sugar 1-2 times a day either fasting or 1 -2hrs after a meal.  Goals of 80-130  fasting and 180 or less after eating. Plan to go to gym 3 days a week 60 minutes 3 times a week.  Plan to walk 30 minutes over the weekend.   Plan to complete EMMI by 02/28/16 Plan to see Dr. Cruzita Lederer on 02/23/16 Plan to return to [facility] on 03/21/16 at [place] Problem One        Most Recent Value   Care Plan Problem One  Elevated blood sugars related to dx of Type 2 DM   Role Documenting the Problem One  Care Management [place] for Problem One  Active   [facility] Long Term Goal (31-90 days)  Member will decrease hemoglobin A1C to 7 or below in next 90 days   [facility] Long Term Goal Start Date  01/26/16 [Not due for hemoglobin A1C check]   Interventions for Problem One Long Term Goal   Reviewed CHO counting and portion control, Reinforced blood sugar goals  and target hemoglobin A1C level, Reinforced to check blood sugar a different times as instructed by MD and to take her log on her 02/23/16 visit,  Reinforced importance of regular exercise to glycemic control,    Peter Garter RN, [facility] Care Management Coordinator-Link to [place] Management [phone]

## 2016-01-26 NOTE — Progress Notes (Signed)
Patient Care Team: Lucille Passy, MD as PCP - General (Family Medicine) Dimitri Ped, RN as [place] Management  DIAGNOSIS: Breast cancer of upper-inner quadrant of left female breast [facility])   Staging form: Breast, AJCC 7th Edition     Clinical: Stage IA (T1b, N0, cM0) - Unsigned       Staging comments: Staged at breast conference 10.22.14      Pathologic: Stage IIA (T1b, N1a, cM0) - Signed by Thea Silversmith, MD on 04/26/2014   SUMMARY OF ONCOLOGIC HISTORY:   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[place])   10/29/2013 Surgery left lumpectomy: 0.9 cm invasive ductal carcinoma, grade 2, ER/PR positive HER-2 negative, 1 sentinel node positive for micrometastatic, Oncotype DX recurrence score 29, 17% ROR   12/07/2013 - 02/08/2014 Chemotherapy Taxotere and Cytoxan 4 adjuvant chemotherapy   03/30/2014 - 05/06/2014 Radiation Therapy adjuvant radiation therapy   05/20/2014 -  Anti-estrogen oral therapy tamoxifen 20 mg daily    CHIEF COMPLIANT: follow-up on anastrozole  INTERVAL HISTORY: Veronica Rogers is a 55 year old with above-mentioned history of left breast cancer treated with lumpectomy followed by adjuvant chemotherapy and radiation and she has been on tamoxifen since May 2015. She is tolerating tamoxifen moderately well she has hot flashes.  REVIEW OF SYSTEMS:   Constitutional: Denies fevers, chills or abnormal weight loss Eyes: Denies blurriness of vision Ears, nose, mouth, throat, and face: Denies mucositis or sore throat Respiratory: Denies cough, dyspnea or wheezes Cardiovascular: Denies palpitation, chest discomfort Gastrointestinal:  Denies nausea, heartburn or change in bowel habits Skin: ring worm infection has resolved Lymphatics: Denies new lymphadenopathy or easy bruising Neurological:Denies numbness, tingling or new weaknesses Behavioral/Psych: Mood is stable, no new changes  Extremities: No lower extremity edema Breast: no  palpable lumps or nodules All other systems were reviewed with the patient and are negative.  I have reviewed the past medical history, past surgical history, social history and family history with the patient and they are unchanged from previous note.  ALLERGIES:  is allergic to erythromycin and tape.  MEDICATIONS:  Current Outpatient Prescriptions  Medication Sig Dispense Refill  . BIOTIN PO Take 1 tablet by mouth every morning.    . Diclofenac Sodium 1.5 % SOLN Apply QID to knee 1 Bottle prn  . meloxicam (MOBIC) 15 MG tablet Take 1 tablet (15 mg total) by mouth daily. 30 tablet 3  . metFORMIN (GLUCOPHAGE-XR) 750 MG 24 hr tablet Take 1 tablet (750 mg total) by mouth 2 (two) times daily with a meal. 60 tablet 2  . Multiple Vitamin (MULTIVITAMIN) tablet Take 1 tablet by mouth daily.    . Probiotic Product (PROBIOTIC PO) Take 1 capsule by mouth daily. Reported on 01/05/2016    . sertraline (ZOLOFT) 50 MG tablet Take 50 mg by mouth every morning.     . tamoxifen (NOLVADEX) 20 MG tablet Take 1 tablet (20 mg total) by mouth daily. 90 tablet 3  . TRUETEST TEST test strip TEST BLOOD GLUCOSE ONCE EACH MORNING 50 each 11   No current facility-administered medications for this visit.    PHYSICAL EXAMINATION: ECOG PERFORMANCE STATUS: 1 - Symptomatic but completely ambulatory  Filed Vitals:   01/26/16 1146  BP: 134/75  Pulse: 83  Temp: 97.7 F (36.5 C)  Resp: 18   Filed Weights   01/26/16 1146  Weight: 197 lb 14.4 oz (89.767 kg)    GENERAL:alert, no distress and comfortable SKIN: skin color, texture, turgor are normal,  no rashes or significant lesions EYES: normal, Conjunctiva are pink and non-injected, sclera clear OROPHARYNX:no exudate, no erythema and lips, buccal mucosa, and tongue normal  NECK: supple, thyroid normal size, non-tender, without nodularity LYMPH:  no palpable lymphadenopathy in the cervical, axillary or inguinal LUNGS: clear to auscultation and percussion with  normal breathing effort HEART: regular rate & rhythm and no murmurs and no lower extremity edema ABDOMEN:abdomen soft, non-tender and normal bowel sounds MUSCULOSKELETAL:no cyanosis of digits and no clubbing  NEURO: alert & oriented x 3 with fluent speech, no focal motor/sensory deficits EXTREMITIES: No lower extremity edema  LABORATORY DATA:  I have reviewed the data as listed   Chemistry      Component Value Date/Time   NA 143 02/27/2015 0851   NA 141 01/17/2015 1404   K 3.1* 02/27/2015 0851   K 4.1 01/17/2015 1404   CL 107 02/27/2015 0851   CO2 27 02/27/2015 0851   CO2 28 01/17/2015 1404   BUN 11 02/27/2015 0851   BUN 16.9 01/17/2015 1404   CREATININE 0.70 02/27/2015 0851   CREATININE 0.7 01/17/2015 1404      Component Value Date/Time   CALCIUM 9.0 02/27/2015 0851   CALCIUM 9.3 01/17/2015 1404   ALKPHOS 39 02/27/2015 0851   ALKPHOS 43 01/17/2015 1404   AST 19 02/27/2015 0851   AST 16 01/17/2015 1404   ALT 19 02/27/2015 0851   ALT 17 01/17/2015 1404   BILITOT 0.3 02/27/2015 0851   BILITOT 0.28 01/17/2015 1404       Lab Results  Component Value Date   WBC 6.5 01/17/2015   HGB 11.6 01/17/2015   HCT 35.8 01/17/2015   MCV 85.6 01/17/2015   PLT 227 01/17/2015   NEUTROABS 3.5 01/17/2015     ASSESSMENT & PLAN:  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 Occasional hot flashes : I discussed different measures including yoga exercise as well as medications including Effexor and Neurontin. she is already on Zoloft , I recommended her to continue with the same for now.  Breast  cancer surveillance: 1. Mammogram done January 2016 is normal 2. Breast exam 07/25/2015   Ring worm infection: failed topical therapy. Prescribed itraconazole 200 mg daily X 7 days, this resolved her infection  Return to clinic in 6 months for follow up and after that we will see her once a year   No orders of the defined types were placed in this encounter.   The patient has a good understanding of the overall plan. she agrees with it. she will call with any problems that may develop before the next visit here.   Rulon Eisenmenger, MD 01/26/2016

## 2016-01-26 NOTE — Telephone Encounter (Signed)
Gave patient avs report and appointments for July.  °

## 2016-02-20 ENCOUNTER — Other Ambulatory Visit: Payer: Self-pay | Admitting: *Deleted

## 2016-02-20 MED ORDER — GLUCOSE BLOOD VI STRP
ORAL_STRIP | Status: DC
Start: 2016-02-20 — End: 2016-11-05

## 2016-02-20 MED ORDER — TRUE METRIX METER W/DEVICE KIT: 1.0000 | PACK | Freq: Every day | Status: DC

## 2016-02-20 MED FILL — METFORMIN HCL ER 750 MG TAB: 750 | 90 days supply | Qty: 180 | Fill #0

## 2016-02-20 MED FILL — SERTRALINE HCL 50 MG TABLET: 50 | 90 days supply | Qty: 90 | Fill #2

## 2016-02-20 MED FILL — TAMOXIFEN 20 MG TABLET: 20 | 90 days supply | Qty: 90 | Fill #3

## 2016-02-20 MED FILL — TRUE METRIX GLUCOSE TEST ST: 50 days supply | Qty: 100 | Fill #0

## 2016-02-23 ENCOUNTER — Encounter: Payer: Self-pay | Admitting: Internal Medicine

## 2016-02-23 ENCOUNTER — Ambulatory Visit (INDEPENDENT_AMBULATORY_CARE_PROVIDER_SITE_OTHER): Payer: 59 | Admitting: Internal Medicine

## 2016-02-23 VITALS — BP 102/60 | HR 95 | Temp 97.7°F | Resp 12 | Wt 197.4 lb

## 2016-02-23 DIAGNOSIS — E1165 Type 2 diabetes mellitus with hyperglycemia: Secondary | ICD-10-CM

## 2016-02-23 NOTE — Patient Instructions (Signed)
Please continue: Metformin 1500 mg but move it at dinnertime  Check sugars 2x a day, rotating checktimes.  Please return in 1.5 months with your sugar log.

## 2016-02-23 NOTE — Progress Notes (Signed)
Patient ID: Veronica Rogers, female   DOB: [birth year], 55 y.o.   MRN: [id number]   HPI: Veronica Rogers is a 55 y.o.-year-old female, returning for f/u for DM2, dx in 2015 (after steroids), non-insulin-dependent, uncontrolled, without complications. Last visit 1.5 mo ago.  Last hemoglobin A1c was: Lab Results  Component Value Date   HGBA1C 8.3 11/30/2015   HGBA1C 7.9* 02/27/2015   HGBA1C 7.9* 11/22/2014   Pt is on a regimen of: - Metformin ER 750 mg x2 at bedtime She was on insulin during ChTx. She was on regular metformin >> GI upset (diarrhea).  Pt checks her sugars 1x a day and they are (did not check in last 3 weeks): - am: 131, 142-179 >> 139-179 - 2h after b'fast: 227 (cereal) >> 120-135 - before lunch: n/c >> 174 - 2h after lunch: n/c >> 92, 132-148 - before dinner: n/c - 2h after dinner: n/c - bedtime: n/c - nighttime: n/c No lows. Lowest sugar was 131 >> 92; she has hypoglycemia awareness at 70.  Highest sugar was 274 >> 179.  Glucometer: True test  Pt's meals are: - Breakfast: cereals or 2 eggs or skips - 10:30 am: PB crackers - Lunch: leftovers from home: sandwich; soup; meat + veggies; chinese; salad - Dinner: same as lunch - Snacks: [employer] - 2x a week.  - no CKD, last BUN/creatinine:  Lab Results  Component Value Date   BUN 11 02/27/2015   CREATININE 0.70 02/27/2015   - last set of lipids: Lab Results  Component Value Date   CHOL 124 02/27/2015   HDL 28.70* 02/27/2015   LDLCALC 63 02/27/2015   LDLDIRECT 117.7 11/22/2014   TRIG 160.0* 02/27/2015   CHOLHDL 4 02/27/2015   - last eye exam was in 12/2014. No DR. Dr Bing Plume. - no numbness and tingling in her feet. Had plantar fasciitis. Has some numbness in L heel.  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  ROS: Constitutional: no weight gain/loss, no fatigue, no subjective hyperthermia/hypothermia Eyes: no blurry vision, no xerophthalmia ENT: no sore throat, no nodules palpated in throat, no  dysphagia/odynophagia, no hoarseness Cardiovascular: no CP/SOB/palpitations/leg swelling Respiratory: no cough/SOB Gastrointestinal: no N/V/D/C Musculoskeletal: no muscle/joint aches Skin: no rashes Neurological: no tremors/numbness/tingling/dizziness  I reviewed pt's medications, allergies, PMH, social hx, family hx, and changes were documented in the history of present illness. Otherwise, unchanged from my initial visit note.  Past Medical History  Diagnosis Date  . Breast cancer ([place])   . Anxiety   . Depression   . Contact lens/glasses fitting     wears contacts or glasses  . Radiation 03/28/14-05/06/14    Left Breast 60 Gy  . Diabetes ([place]) 11/22/2014  . Hyperlipidemia    Past Surgical History  Procedure Laterality Date  . Breast biopsy  1996    right breast, benign  . Wisdom tooth extraction    . Colonoscopy  05/2011    WNL  . Breast lumpectomy with needle localization and axillary sentinel lymph node bx Left 10/29/2013    Procedure: BREAST LUMPECTOMY WITH NEEDLE LOCALIZATION AND AXILLARY SENTINEL LYMPH NODE BX;  Surgeon: Rolm Bookbinder, MD;  Location: [facility];  Service: General;  Laterality: Left;   Social History   Social History  . Marital Status: Married    Spouse Name: N/A  . Number of Children: 2   Occupational History  . [employer] - support rep   Social History Main Topics  . Smoking status: Never Smoker   .  Smokeless tobacco: Never Used  . Alcohol Use: Yes     Comment: 1-2 drinks a month  . Drug Use: No   Current Outpatient Prescriptions on File Prior to Visit  Medication Sig Dispense Refill  . BIOTIN PO Take 1 tablet by mouth every morning.    . Blood Glucose Monitoring Suppl (TRUE METRIX METER) w/Device KIT 1 each by Does not apply route daily. 1 kit 0  . Diclofenac Sodium 1.5 % SOLN Apply QID to knee 1 Bottle prn  . glucose blood (TRUETEST TEST) test strip Use to test blood sugar 2 times daily as instructed.  Dx: E11.65 75 each 3  . meloxicam (MOBIC) 15 MG tablet Take 1 tablet (15 mg total) by mouth daily. 30 tablet 3  . metFORMIN (GLUCOPHAGE-XR) 750 MG 24 hr tablet Take 1 tablet (750 mg total) by mouth 2 (two) times daily with a meal. 60 tablet 2  . Multiple Vitamin (MULTIVITAMIN) tablet Take 1 tablet by mouth daily.    . Probiotic Product (PROBIOTIC PO) Take 1 capsule by mouth daily. Reported on 01/05/2016    . sertraline (ZOLOFT) 50 MG tablet Take 50 mg by mouth every morning.     . tamoxifen (NOLVADEX) 20 MG tablet Take 1 tablet (20 mg total) by mouth daily. 90 tablet 3   No current facility-administered medications on file prior to visit.   Allergies  Allergen Reactions  . Erythromycin Other (See Comments)    Stomach pain  . Tape Rash   Family History  Problem Relation Age of Onset  . Squamous cell carcinoma Father   . Cancer Father   . Diabetes Father   . Hypertension Mother   . Diabetes Sister    PE: BP 102/60 mmHg  Pulse 95  Temp(Src) 97.7 F (36.5 C) (Oral)  Resp 12  Wt 197 lb 6.4 oz (89.54 kg)  SpO2 96% Body mass index is 30.02 kg/(m^2). Wt Readings from Last 3 Encounters:  02/23/16 197 lb 6.4 oz (89.54 kg)  01/26/16 197 lb 14.4 oz (89.767 kg)  01/26/16 198 lb 3.2 oz (89.903 kg)   Constitutional: overweight, in NAD Eyes: PERRLA, EOMI, no exophthalmos ENT: moist mucous membranes, no thyromegaly, no cervical lymphadenopathy Cardiovascular: RRR, No MRG Respiratory: CTA B Gastrointestinal: abdomen soft, NT, ND, BS+ Musculoskeletal: no deformities, strength intact in all 4 Skin: moist, warm, no rashes Neurological: no tremor with outstretched hands, DTR normal in all 4  ASSESSMENT: 1. DM2, non-insulin-dependent, uncontrolled, without complications  PLAN:  1. Patient with long-standing, uncontrolled diabetes, on Metformin, which we increased at last visit. Unfortunately, she did not check sugars in last 3 weeks, and before, she only checked sparsely. They do appear  better, though. Since she is telling me that she sometimes feels more "shaky" and hungry >> will not add any more meds for now >> given new logs and advised her how to check sugars and will review log at next visit. We will also check a new HbA1c then. - We may need to add a DPP4 inh at next visit - I suggested to move Metformin at dinnertime as she is now taking it at bedtime:  Patient Instructions  Please continue: Metformin 1500 mg but move it at dinnertime  Check sugars 2x a day, rotating checktimes.  Please return in 1.5 months with your sugar log.  - continue sugars at different times of the day - check 1-2 times a day, rotating checks - advised for yearly eye exams >> she is UTD -  advised to schedule a new eye appt - Return to clinic in 1.5 mo with sugar log

## 2016-03-05 DIAGNOSIS — H5212 Myopia, left eye: Secondary | ICD-10-CM | POA: Diagnosis not present

## 2016-03-05 DIAGNOSIS — H52221 Regular astigmatism, right eye: Secondary | ICD-10-CM | POA: Diagnosis not present

## 2016-03-05 DIAGNOSIS — H5211 Myopia, right eye: Secondary | ICD-10-CM | POA: Diagnosis not present

## 2016-03-05 DIAGNOSIS — H524 Presbyopia: Secondary | ICD-10-CM | POA: Diagnosis not present

## 2016-03-21 ENCOUNTER — Ambulatory Visit: Payer: 59

## 2016-03-26 LAB — HM DIABETES EYE EXAM

## 2016-04-01 ENCOUNTER — Encounter: Payer: Self-pay | Admitting: Hematology and Oncology

## 2016-04-01 NOTE — Progress Notes (Signed)
fmla forms faxed 07/07/15 I sent to medical records

## 2016-04-05 MED FILL — TRUE METRIX GLUCOSE TEST ST: 50 days supply | Qty: 100 | Fill #1

## 2016-04-11 ENCOUNTER — Encounter: Payer: Self-pay | Admitting: Internal Medicine

## 2016-04-11 ENCOUNTER — Other Ambulatory Visit (INDEPENDENT_AMBULATORY_CARE_PROVIDER_SITE_OTHER): Payer: 59 | Admitting: *Deleted

## 2016-04-11 ENCOUNTER — Ambulatory Visit (INDEPENDENT_AMBULATORY_CARE_PROVIDER_SITE_OTHER): Payer: 59 | Admitting: Internal Medicine

## 2016-04-11 VITALS — BP 122/64 | HR 82 | Temp 97.9°F | Resp 12 | Wt 198.8 lb

## 2016-04-11 DIAGNOSIS — E1165 Type 2 diabetes mellitus with hyperglycemia: Secondary | ICD-10-CM

## 2016-04-11 LAB — COMPLETE METABOLIC PANEL WITH GFR
ALT: 18 U/L (ref 6–29)
AST: 17 U/L (ref 10–35)
Albumin: 4.3 g/dL (ref 3.6–5.1)
Alkaline Phosphatase: 37 U/L (ref 33–130)
BUN: 19 mg/dL (ref 7–25)
CALCIUM: 9.6 mg/dL (ref 8.6–10.4)
CHLORIDE: 104 mmol/L (ref 98–110)
CO2: 24 mmol/L (ref 20–31)
CREATININE: 0.69 mg/dL (ref 0.50–1.05)
Glucose, Bld: 156 mg/dL — ABNORMAL HIGH (ref 65–99)
Potassium: 4.1 mmol/L (ref 3.5–5.3)
Sodium: 143 mmol/L (ref 135–146)
Total Bilirubin: 0.4 mg/dL (ref 0.2–1.2)
Total Protein: 6.7 g/dL (ref 6.1–8.1)

## 2016-04-11 LAB — LIPID PANEL
CHOLESTEROL: 204 mg/dL — AB (ref 0–200)
HDL: 41.1 mg/dL (ref >39.00)
Total CHOL/HDL Ratio: 5

## 2016-04-11 LAB — POCT GLYCOSYLATED HEMOGLOBIN (HGB A1C): Hemoglobin A1C: 7.8

## 2016-04-11 LAB — LDL CHOLESTEROL, DIRECT: Direct LDL: 83 mg/dL

## 2016-04-11 MED ORDER — GLIPIZIDE 5 MG PO TABS: 5.0000 mg | ORAL_TABLET | Freq: Every day | ORAL | Status: DC

## 2016-04-11 MED FILL — glipiZIDE 5 MG TABS: 5 | 30 days supply | Qty: 30 | Fill #0

## 2016-04-11 NOTE — Patient Instructions (Signed)
Please continue: - Metformin ER 1500 mg at bedtime  Start: - Glipizide 5 mg before dinner  Please return in 3 months with your sugar log.   Please stop at the lab.

## 2016-04-11 NOTE — Progress Notes (Signed)
Patient ID: Veronica Rogers, female   DOB: [birth year], 55 y.o.   MRN: [id number]   HPI: Veronica Rogers is a 55 y.o.-year-old female, returning for f/u for DM2, dx in 2015 (after steroids), non-insulin-dependent, uncontrolled, without complications. Last visit 1.5 mo ago.  Last hemoglobin A1c was: Lab Results  Component Value Date   HGBA1C 8.3 11/30/2015   HGBA1C 7.9* 02/27/2015   HGBA1C 7.9* 11/22/2014   Pt is on a regimen of: - Metformin ER 1500 mg at bedtime She was on insulin during ChTx. She was on regular metformin >> GI upset (diarrhea).  Pt checks her sugars 1x a day and they are: - am: 131, 142-179 >> 139-179 >> 126, 163-193 - 2h after b'fast: 227 (cereal) >> 120-135 >> 75 (after glimepiride), 132-168 - before lunch: n/c >> 174 >> n/c - 2h after lunch: n/c >> 92, 132-148 >> 176 - before dinner: n/c >> 124 - 2h after dinner: n/c >> 182, 254 - bedtime: n/c - nighttime: n/c No lows. Lowest sugar was 131 >> 92 >> 126; she has hypoglycemia awareness at 70.  Highest sugar was 274 >> 179 >> 254.  Glucometer: True test  Pt's meals are: - Breakfast: cereals or 2 eggs or skips - 10:30 am: PB crackers - Lunch: leftovers from home: sandwich; soup; meat + veggies; chinese; salad - Dinner: same as lunch [employer] - 2x a week.  - no CKD, last BUN/creatinine:  Lab Results  Component Value Date   BUN 11 02/27/2015   CREATININE 0.70 02/27/2015   - last set of lipids: Lab Results  Component Value Date   CHOL 124 02/27/2015   HDL 28.70* 02/27/2015   LDLCALC 63 02/27/2015   LDLDIRECT 117.7 11/22/2014   TRIG 160.0* 02/27/2015   CHOLHDL 4 02/27/2015   - last eye exam was in 03/26/2016. No DR. Dr Bing Plume. - no numbness and tingling in her feet. Had plantar fasciitis. Has some numbness in L heel.  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  ROS: Constitutional: no weight gain/loss, no fatigue, no subjective hyperthermia/hypothermia Eyes: no blurry vision, no xerophthalmia ENT: no sore  throat, no nodules palpated in throat, no dysphagia/odynophagia, no hoarseness Cardiovascular: no CP/SOB/palpitations/leg swelling Respiratory: no cough/SOB Gastrointestinal: no N/V/D/C Musculoskeletal: no muscle/joint aches Skin: no rashes Neurological: no tremors/numbness/tingling/dizziness  I reviewed pt's medications, allergies, PMH, social hx, family hx, and changes were documented in the history of present illness. Otherwise, unchanged from my initial visit note.  Past Medical History  Diagnosis Date  . Breast cancer ([place])   . Anxiety   . Depression   . Contact lens/glasses fitting     wears contacts or glasses  . Radiation 03/28/14-05/06/14    Left Breast 60 Gy  . Diabetes ([place]) 11/22/2014  . Hyperlipidemia    Past Surgical History  Procedure Laterality Date  . Breast biopsy  1996    right breast, benign  . Wisdom tooth extraction    . Colonoscopy  05/2011    WNL  . Breast lumpectomy with needle localization and axillary sentinel lymph node bx Left 10/29/2013    Procedure: BREAST LUMPECTOMY WITH NEEDLE LOCALIZATION AND AXILLARY SENTINEL LYMPH NODE BX;  Surgeon: Rolm Bookbinder, MD;  Location: [facility];  Service: General;  Laterality: Left;   Social History   Social History  . Marital Status: Married    Spouse Name: N/A  . Number of Children: 2   Occupational History  . [employer]  Social History Main Topics  . Smoking status: Never Smoker   . Smokeless tobacco: Never Used  . Alcohol Use: Yes     Comment: 1-2 drinks a month  . Drug Use: No   Current Outpatient Prescriptions on File Prior to Visit  Medication Sig Dispense Refill  . BIOTIN PO Take 1 tablet by mouth every morning.    . Blood Glucose Monitoring Suppl (TRUE METRIX METER) w/Device KIT 1 each by Does not apply route daily. 1 kit 0  . Diclofenac Sodium 1.5 % SOLN Apply QID to knee 1 Bottle prn  . glucose blood (TRUETEST TEST) test strip Use to test  blood sugar 2 times daily as instructed. Dx: E11.65 75 each 3  . meloxicam (MOBIC) 15 MG tablet Take 1 tablet (15 mg total) by mouth daily. 30 tablet 3  . metFORMIN (GLUCOPHAGE-XR) 750 MG 24 hr tablet Take 1 tablet (750 mg total) by mouth 2 (two) times daily with a meal. 60 tablet 2  . Multiple Vitamin (MULTIVITAMIN) tablet Take 1 tablet by mouth daily.    . Probiotic Product (PROBIOTIC PO) Take 1 capsule by mouth daily. Reported on 01/05/2016    . sertraline (ZOLOFT) 50 MG tablet Take 50 mg by mouth every morning.     . tamoxifen (NOLVADEX) 20 MG tablet Take 1 tablet (20 mg total) by mouth daily. 90 tablet 3   No current facility-administered medications on file prior to visit.   Allergies  Allergen Reactions  . Erythromycin Other (See Comments)    Stomach pain  . Tape Rash   Family History  Problem Relation Age of Onset  . Squamous cell carcinoma Father   . Cancer Father   . Diabetes Father   . Hypertension Mother   . Diabetes Sister    PE: BP 122/64 mmHg  Pulse 82  Temp(Src) 97.9 F (36.6 C) (Oral)  Resp 12  Wt 198 lb 12.8 oz (90.175 kg)  SpO2 94% Body mass index is 30.23 kg/(m^2). Wt Readings from Last 3 Encounters:  04/11/16 198 lb 12.8 oz (90.175 kg)  02/23/16 197 lb 6.4 oz (89.54 kg)  01/26/16 197 lb 14.4 oz (89.767 kg)   Constitutional: overweight, in NAD Eyes: PERRLA, EOMI, no exophthalmos ENT: moist mucous membranes, no thyromegaly, no cervical lymphadenopathy Cardiovascular: RRR, No MRG Respiratory: CTA B Gastrointestinal: abdomen soft, NT, ND, BS+ Musculoskeletal: no deformities, strength intact in all 4 Skin: moist, warm, no rashes Neurological: no tremor with outstretched hands, DTR normal in all 4  ASSESSMENT: 1. DM2, non-insulin-dependent, uncontrolled, without complications  PLAN:  1. Patient with long-standing, uncontrolled diabetes, on Metformin, which we moved at dinnertime at last visit. Sugars a little better. As sugars after dinner >> high >>  advised to start a glipizide 5 mg before this meal. - We may need to add a DPP4 inh at next visit - I suggested to:  Patient Instructions  Please continue: - Metformin ER 1500 mg at bedtime  Start: - Glipizide 5 mg before dinner  Please return in 3 months with your sugar log.   Please stop at the lab.  - continue sugars at different times of the day - check 1-2 times a day, rotating checks - advised for yearly eye exams >> she is UTD - will check Lipids (nonfasting) and CMP - checked HbA1c now >> 7.8% (better) - Return to clinic in 3 mo with sugar log   Orders Only on 04/11/2016  Component Date Value Ref Range Status  . Hemoglobin  A1C 04/11/2016 7.8   Final  Office Visit on 04/11/2016  Component Date Value Ref Range Status  . Sodium 04/11/2016 143  135 - 146 mmol/L Final  . Potassium 04/11/2016 4.1  3.5 - 5.3 mmol/L Final  . Chloride 04/11/2016 104  98 - 110 mmol/L Final  . CO2 04/11/2016 24  20 - 31 mmol/L Final  . Glucose, Bld 04/11/2016 156* 65 - 99 mg/dL Final  . BUN 04/11/2016 19  7 - 25 mg/dL Final  . Creat 04/11/2016 0.69  0.50 - 1.05 mg/dL Final  . Total Bilirubin 04/11/2016 0.4  0.2 - 1.2 mg/dL Final  . Alkaline Phosphatase 04/11/2016 37  33 - 130 U/L Final  . AST 04/11/2016 17  10 - 35 U/L Final  . ALT 04/11/2016 18  6 - 29 U/L Final  . Total Protein 04/11/2016 6.7  6.1 - 8.1 g/dL Final  . Albumin 04/11/2016 4.3  3.6 - 5.1 g/dL Final  . Calcium 04/11/2016 9.6  8.6 - 10.4 mg/dL Final  . GFR, Est African American 04/11/2016 >89  >=60 mL/min Final  . GFR, Est Non African American 04/11/2016 >89  >=60 mL/min Final   Comment:   The estimated GFR is a calculation valid for adults (>=58 years old) that uses the CKD-EPI algorithm to adjust for age and sex. It is   not to be used for children, pregnant women, hospitalized patients,    patients on dialysis, or with rapidly changing kidney function. According to the NKDEP, eGFR >89 is normal, 60-89 shows mild impairment,  30-59 shows moderate impairment, 15-29 shows severe impairment and <15 is ESRD.     Marland Kitchen Cholesterol 04/11/2016 204* 0 - 200 mg/dL Final   ATP III Classification       Desirable:  < 200 mg/dL               Borderline High:  200 - 239 mg/dL          High:  > = 240 mg/dL  . Triglycerides 04/11/2016 590.0 Triglyceride is over 400; calculations on Lipids are invalid.* 0.0 - 149.0 mg/dL Final   Normal:  <150 mg/dLBorderline High:  150 - 199 mg/dL  . HDL 04/11/2016 41.10  >39.00 mg/dL Final  . Total CHOL/HDL Ratio 04/11/2016 5   Final                  Men          Women1/2 Average Risk     3.4          3.3Average Risk          5.0          4.42X Average Risk          9.6          7.13X Average Risk          15.0          11.0                      . HM Diabetic Eye Exam 03/26/2016 No Retinopathy  No Retinopathy Final  . Direct LDL 04/11/2016 83.0   Final   Optimal:  <100 mg/dLNear or Above Optimal:  100-129 mg/dLBorderline High:  130-159 mg/dLHigh:  160-189 mg/dLVery High:  >190 mg/dL    Tg very high.  LDL at goal. CMP normal, except for high glucose.  We'll need to repeat triglycerides at next visit. Until then, reduce concentrated sweets and  fatty foods.

## 2016-05-03 ENCOUNTER — Telehealth: Payer: Self-pay

## 2016-05-03 NOTE — Telephone Encounter (Signed)
PLEASE NOTE: All timestamps contained within this report are represented as [place] Standard Time. CONFIDENTIALTY NOTICE: This fax transmission is intended only for the addressee. It contains information that is legally privileged, confidential or otherwise protected from use or disclosure. If you are not the intended recipient, you are strictly prohibited from reviewing, disclosing, copying using or disseminating any of this information or taking any action in reliance on or regarding this information. If you have received this fax in error, please notify us immediately by telephone so that we can arrange for its return to [place]. Phone: [phone], Toll-Free: [phone], Fax: [phone] Page: 1 of 2 Call Id: [id number] Bernie Patient Name: Veronica Rogers Gender: Female DOB: [birth year] Age: 55 Y 32 M 10 D Return Phone Number: [phone] (Primary), [phone] (Secondary) Address: [place]/State/Zip: [place] [address] Client [facility] Night - Client Client Site [facility] Physician Arnette Norris - MD Contact Type Call Who Is Calling Patient / Member / Family / Caregiver Call Type Triage / Clinical Relationship To Patient Self Return Phone Number [phone] (Primary) Chief Complaint Cough Reason for Call Symptomatic / Request for [place] states left a message for the Dr to fill cough medicine with codeine and is needing it because she is coughing. PreDisposition Home Care Translation No Nurse Assessment Nurse: Windle Guard, RN, Lesa Date/Time (Eastern Time): 05/02/2016 5:25:45 PM Confirm and document reason for call. If symptomatic, describe symptoms. You must click the next button to save text entered. ---Caller states she has a non productive cough and an earache Has the patient  traveled out of the country within the last 30 days? ---No Does the patient have any new or worsening symptoms? ---Yes Will a triage be completed? ---Yes Related visit to physician within the last 2 weeks? ---No Does the PT have any chronic conditions? (i.e. diabetes, asthma, etc.) ---Yes List chronic conditions. ---Diabetes, Is this a behavioral health or substance abuse call? ---No Guidelines Guideline Title Affirmed Question Affirmed Notes Nurse Date/Time Eilene Ghazi Time) Cough - Acute Non- Productive Wheezing is present Actor, RN, Lesa 05/02/2016 5:27:42 PM Disp. Time Eilene Ghazi Time) Disposition Final User 05/02/2016 5:31:30 PM See Physician within 4 Hours (or PCP triage) Yes Conner, RN, Lesa PLEASE NOTE: All timestamps contained within this report are represented as [place] Standard Time. CONFIDENTIALTY NOTICE: This fax transmission is intended only for the addressee. It contains information that is legally privileged, confidential or otherwise protected from use or disclosure. If you are not the intended recipient, you are strictly prohibited from reviewing, disclosing, copying using or disseminating any of this information or taking any action in reliance on or regarding this information. If you have received this fax in error, please notify us immediately by telephone so that we can arrange for its return to [place]. Phone: [phone], Toll-Free: [phone], Fax: [phone] Page: 2 of 2 Call Id: [id number] Sachse Understands: Yes Disagree/Comply: Disagree Disagree/Comply Reason: Disagree with instructions Care Advice Given Per Guideline SEE PHYSICIAN WITHIN 4 HOURS (or PCP triage): * IF OFFICE WILL BE CLOSED AND NO PCP TRIAGE: You need to be seen within the next 3 or 4 hours. A nearby Urgent Care Center is often a good source of care. Another choice is to go to the ER. Go sooner if you become worse. CALL BACK IF: * You become worse. CARE ADVICE given per Cough - Acute  Non-Productive (Adult) guideline. Comments User: Starleen Blue, RN Date/Time Eilene Ghazi Time): 05/02/2016 5:31:25 PM She would like a nurse to call her in the morning Referrals GO TO FACILITY REFUSED

## 2016-05-03 NOTE — Telephone Encounter (Signed)
I spoke with pt; pt has cough and thinks related to allergies; Pt last saw Dr Deborra Medina 01/2015;advised pt needs to be seen to eval cough;pt is at work today and cannot schedule appt. Pt is aware of [facility] and will cb next week for appt with Dr Deborra Medina (pt prefers to see Dr Deborra Medina) if does not clear up. FYI to Dr Deborra Medina.

## 2016-05-15 ENCOUNTER — Encounter: Payer: Self-pay | Admitting: Family Medicine

## 2016-05-15 ENCOUNTER — Other Ambulatory Visit: Payer: Self-pay | Admitting: Family Medicine

## 2016-05-15 MED ORDER — BENZONATATE 200 MG PO CAPS: 200.0000 mg | ORAL_CAPSULE | Freq: Two times a day (BID) | ORAL | Status: DC | PRN

## 2016-05-15 MED FILL — BENZONATATE 200 MG CAPSULE: 200 | 10 days supply | Qty: 20 | Fill #0

## 2016-05-15 NOTE — Telephone Encounter (Signed)
Patient wanted to thank Dr.Aron for calling in medication.  Patient scheduled cpx on 07/25/16.

## 2016-05-16 ENCOUNTER — Other Ambulatory Visit: Payer: Self-pay

## 2016-05-16 ENCOUNTER — Ambulatory Visit: Payer: No Typology Code available for payment source | Admitting: Family Medicine

## 2016-05-16 VITALS — BP 120/82 | HR 80 | Resp 14 | Ht 68.0 in | Wt 199.2 lb

## 2016-05-16 DIAGNOSIS — C50212 Malignant neoplasm of upper-inner quadrant of left female breast: Secondary | ICD-10-CM

## 2016-05-16 DIAGNOSIS — E119 Type 2 diabetes mellitus without complications: Secondary | ICD-10-CM

## 2016-05-16 MED ORDER — TAMOXIFEN CITRATE 20 MG PO TABS: 20.0000 mg | ORAL_TABLET | Freq: Every day | ORAL | Status: DC

## 2016-05-16 NOTE — Patient Outreach (Signed)
[facility] Veronica Rogers) Care Management   05/16/2016  Veronica Rogers [birth year] [id number]  Veronica Rogers is an 55 y.o. female.   Member seen for follow up office visit for [facility] to Wellness program for self management of Type 2 diabetes  Subjective: Member states that she saw Dr. Elvera Lennox in April and her hemoglobin A1C was down to 7.8%.  States that she did start her on a new medication to take at night to help lower her evening blood sugars.  States she sometimes does not take if she thinks she does not need it.  States she is trying to check her blood sugars everyday but she does miss some days.  States she is trying to watch her diet and eat less CHO.  States she is going to the gym three times a week and she walks on the weekends.  States she did walk a 5 K one weekend for the ALS walk.     Objective:   Review of Systems  All other systems reviewed and are negative. Reviewed glucometer 7 day average-151 14 day average-157 30 day average-159  Physical Exam Today's Vitals   05/16/16 1613  BP: 120/82  Pulse: 80  Resp: 14  Height: 1.727 m (5\' 8" )  Weight: 199 lb 3.2 oz (90.357 kg)  SpO2: 95%  PainSc: 0-No pain    Encounter Medications:   Outpatient Encounter Prescriptions as of 05/16/2016  Medication Sig  . benzonatate (TESSALON) 200 MG capsule Take 1 capsule (200 mg total) by mouth 2 (two) times daily as needed for cough.  05/18/2016 BIOTIN PO Take 1 tablet by mouth every morning.  . Blood Glucose Monitoring Suppl (TRUE METRIX METER) w/Device KIT 1 each by Does not apply route daily.  . Diclofenac Sodium 1.5 % SOLN Apply QID to knee  . glipiZIDE (GLUCOTROL) 5 MG tablet Take 1 tablet (5 mg total) by mouth daily before supper.  Marland Kitchen glucose blood (TRUETEST TEST) test strip Use to test blood sugar 2 times daily as instructed. Dx: E11.65  . meloxicam (MOBIC) 15 MG tablet Take 1 tablet (15 mg total) by mouth daily.  . metFORMIN (GLUCOPHAGE-XR) 750 MG 24 hr tablet Take 1 tablet (750  mg total) by mouth 2 (two) times daily with a meal.  . Multiple Vitamin (MULTIVITAMIN) tablet Take 1 tablet by mouth daily.  . Probiotic Product (PROBIOTIC PO) Take 1 capsule by mouth daily. Reported on 01/05/2016  . sertraline (ZOLOFT) 50 MG tablet Take 50 mg by mouth every morning.   . tamoxifen (NOLVADEX) 20 MG tablet Take 1 tablet (20 mg total) by mouth daily.   No facility-administered encounter medications on file as of 05/16/2016.    Functional Status:   In your present state of health, do you have any difficulty performing the following activities: 05/16/2016 01/26/2016  Hearing? N N  Vision? N N  Difficulty concentrating or making decisions? N N  Walking or climbing stairs? N N  Dressing or bathing? N N  Doing errands, shopping? N N    Fall/Depression Screening:    PHQ 2/9 Scores 05/16/2016 01/26/2016 11/30/2015 06/27/2015  PHQ - 2 Score 0 0 0 0    Assessment:  Member seen for follow up office visit for Link to Wellness program for self management of Type 2 diabetes. Member is above diabetes self management goal of 7% with last reading of 7.8% on 04/11/16.  This is decreased from 8.3%.   Member had glipizide added to take at evening meal to  help lower evening bloods sugars.  She has been taking a bedtime if she takes the glipizide.   She will follow up with endocrinologist on 07/11/16. CBGs  ranges of 120-163 in AM and 127-230 after dinner. Member is going to gym 3 times a week and walking on weekends.  Member is up to date on annual eye exam.  Plan:  Plan to eat 30-45 GM (2-3) servings of carbohydrate a meal and 15 GM for snacks.  Plan to eat protein with your snacks Plan to check blood sugar 1-2 times a day either fasting or 1 -2hrs after a meal.  Goals of 80-130 fasting and 180 or less after eating. Plan to go to gym 3 days a week 60 minutes 3 times a week.  Plan to walk 30 minutes over the weekend.   Plan to complete EMMI by 06/29/16 Plan to see Dr. Cruzita Lederer on 07/11/16 Plan to  return to [facility] on 08/22/16 at [place] Problem One        Most Recent Value   Care Plan Problem One  Elevated blood sugars related to dx of Type 2 DM   Role Documenting the Problem One  Care Management Coordinator   Care Plan for Problem One  Active   [facility] Long Term Goal (31-90 days)  Member will decrease hemoglobin A1C to 7 or below in next 90 days   [facility] Long Term Goal Start Date  05/16/16 [Continue hemoglobin A1C down to 7.8%]   Interventions for Problem One Long Term Goal   Reviewed CHO counting and portion control, Reinforced blood sugar goals and target hemoglobin A1C level, Instructed to take her glipizide with food in the evening as directed by MD, Instructed on s/s of hypoglycemia, the rule of 15 and actions to take, given handout on hypoglycemia,  Reinforced to check blood sugar a different times as instructed by MD and to take her log on her 07/11/16 visit, Instructed on whys to help lower her trigylcerides Reinforced importance of regular exercise to glycemic control,    Peter Garter RN, [facility] Care Management Coordinator-Link to [place] Management [phone]

## 2016-05-17 NOTE — Patient Instructions (Signed)
1. Plan to eat 30-45 GM (2-3) servings of carbohydrate a meal and 15 GM for snacks.  Plan to eat protein with your snacks 2. Plan to check blood sugar 1-2 times a day either fasting or 1 -2hrs after a meal.  Goals of 80-130 fasting and 180 or less after eating. 3. Plan to go to gym 3 days a week 60 minutes 3 times a week.  Plan to walk 30 minutes over the weekend.   4. Plan to complete EMMI by 06/29/16 5. Plan to see Dr. Cruzita Lederer on 07/11/16 6. Plan to return to [facility] on 08/22/16 at [facility]

## 2016-05-21 ENCOUNTER — Other Ambulatory Visit: Payer: Self-pay | Admitting: *Deleted

## 2016-05-21 DIAGNOSIS — C50212 Malignant neoplasm of upper-inner quadrant of left female breast: Secondary | ICD-10-CM

## 2016-05-21 MED ORDER — TAMOXIFEN CITRATE 20 MG PO TABS: 20.0000 mg | ORAL_TABLET | Freq: Every day | ORAL | Status: DC

## 2016-05-21 MED FILL — TAMOXIFEN 20 MG TABLET: 20 | 90 days supply | Qty: 90 | Fill #0

## 2016-05-22 ENCOUNTER — Other Ambulatory Visit: Payer: Self-pay

## 2016-05-22 ENCOUNTER — Other Ambulatory Visit: Payer: Self-pay | Admitting: *Deleted

## 2016-05-22 MED ORDER — METFORMIN HCL ER 750 MG PO TB24: 1500.0000 mg | ORAL_TABLET | Freq: Every day | ORAL | Status: DC

## 2016-05-22 MED FILL — SERTRALINE HCL 50 MG TABLET: 50 | 90 days supply | Qty: 90 | Fill #0

## 2016-05-22 MED FILL — METFORMIN HCL ER 750 MG TAB: 750 | 30 days supply | Qty: 60 | Fill #0

## 2016-06-14 MED FILL — glipiZIDE 5 MG TABS: 5 | 30 days supply | Qty: 30 | Fill #1

## 2016-06-16 DIAGNOSIS — S838X1A Sprain of other specified parts of right knee, initial encounter: Secondary | ICD-10-CM | POA: Diagnosis not present

## 2016-06-16 DIAGNOSIS — M25561 Pain in right knee: Secondary | ICD-10-CM | POA: Diagnosis not present

## 2016-06-26 MED FILL — METFORMIN HCL ER 750 MG TAB: 750 | 30 days supply | Qty: 60 | Fill #1

## 2016-06-26 MED FILL — MELOXICAM 15 MG TABLET: 15 | 30 days supply | Qty: 30 | Fill #2

## 2016-07-11 ENCOUNTER — Encounter: Payer: Self-pay | Admitting: Internal Medicine

## 2016-07-11 ENCOUNTER — Ambulatory Visit (INDEPENDENT_AMBULATORY_CARE_PROVIDER_SITE_OTHER): Payer: 59 | Admitting: Internal Medicine

## 2016-07-11 VITALS — BP 142/80 | HR 84 | Ht 67.0 in | Wt 199.0 lb

## 2016-07-11 DIAGNOSIS — E1165 Type 2 diabetes mellitus with hyperglycemia: Secondary | ICD-10-CM | POA: Diagnosis not present

## 2016-07-11 LAB — POCT GLYCOSYLATED HEMOGLOBIN (HGB A1C): Hemoglobin A1C: 7.3

## 2016-07-11 NOTE — Progress Notes (Signed)
Patient ID: XITLALLY Veronica Rogers, female   DOB: 28-Jul-1961, 55 y.o.   MRN: [id number]   HPI: Veronica Rogers is a 55 y.o.-year-old female, returning for f/u for DM2, dx in 2015 (after steroids), non-insulin-dependent, uncontrolled, without complications. Last visit 3 mo ago.  Last hemoglobin A1c was: Lab Results  Component Value Date   HGBA1C 7.8 04/11/2016   HGBA1C 8.3 11/30/2015   HGBA1C 7.9* 02/27/2015   Pt is on a regimen of: - Metformin ER 1500 mg with dinner - Glipizide 5 mg at bedtime (!) - added 03/2006  She was on insulin during ChTx. She was on regular metformin >> GI upset (diarrhea).  Pt checks her sugars 1x a day and they are: - am: 131, 142-179 >> 139-179 >> 126, 163-193 >> 132-169, 192 (candy) - 2h after b'fast: 227 (cereal) >> 120-135 >> 75 (after glimepiride), 132-168 >> n/c - before lunch: n/c >> 174 >> n/c - 2h after lunch: n/c >> 92, 132-148 >> 176 - before dinner: n/c >> 124 >> 91-128, 160 - 2h after dinner: n/c >> 182, 254 >> 156, 172, 231 - bedtime: n/c - nighttime: n/c >> 82  No lows. Lowest sugar was 131 >> 92 >> 126; she has hypoglycemia awareness at 70.  Highest sugar was 274 >> 179 >> 254 >> 231.  Glucometer: True test  Pt's meals are: - Breakfast: cereals or 2 eggs or skips - 10:30 am: PB crackers - Lunch: leftovers from home: sandwich; soup; meat + veggies; chinese; salad - Dinner: same as lunch [employer] - 2x a week.  - no CKD, last BUN/creatinine:  Lab Results  Component Value Date   BUN 19 04/11/2016   CREATININE 0.69 04/11/2016   - last set of lipids: Lab Results  Component Value Date   CHOL 204* 04/11/2016   HDL 41.10 04/11/2016   LDLCALC 63 02/27/2015   LDLDIRECT 83.0 04/11/2016   TRIG * 04/11/2016    590.0 Triglyceride is over 400; calculations on Lipids are invalid.   CHOLHDL 5 04/11/2016   - last eye exam was in 03/26/2016. No DR. Dr Bing Plume. - no numbness and tingling in her feet. Had plantar fasciitis. Has some numbness  in L heel.  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  ROS: Constitutional: no weight gain/loss, no fatigue, no subjective hyperthermia/hypothermia Eyes: no blurry vision, no xerophthalmia ENT: no sore throat, no nodules palpated in throat, no dysphagia/odynophagia, no hoarseness Cardiovascular: no CP/SOB/palpitations/leg swelling Respiratory: no cough/SOB Gastrointestinal: no N/V/D/C Musculoskeletal: no muscle/joint aches Skin: no rashes Neurological: no tremors/numbness/tingling/dizziness  I reviewed pt's medications, allergies, PMH, social hx, family hx, and changes were documented in the history of present illness. Otherwise, unchanged from my initial visit note.  Past Medical History  Diagnosis Date  . Breast cancer ([place])   . Anxiety   . Depression   . Contact lens/glasses fitting     wears contacts or glasses  . Radiation 03/28/14-05/06/14    Left Breast 60 Gy  . Diabetes ([place]) 11/22/2014  . Hyperlipidemia    Past Surgical History  Procedure Laterality Date  . Breast biopsy  1996    right breast, benign  . Wisdom tooth extraction    . Colonoscopy  05/2011    WNL  . Breast lumpectomy with needle localization and axillary sentinel lymph node bx Left 10/29/2013    Procedure: BREAST LUMPECTOMY WITH NEEDLE LOCALIZATION AND AXILLARY SENTINEL LYMPH NODE BX;  Surgeon: Rolm Bookbinder, MD;  Location: [place];  Service: General;  Laterality:  Left;   Social History   Social History  . Marital Status: Married    Spouse Name: N/A  . Number of Children: 2   Occupational History  . [employer] - support rep   Social History Main Topics  . Smoking status: Never Smoker   . Smokeless tobacco: Never Used  . Alcohol Use: Yes     Comment: 1-2 drinks a month  . Drug Use: No   Current Outpatient Prescriptions on File Prior to Visit  Medication Sig Dispense Refill  . benzonatate (TESSALON) 200 MG capsule Take 1 capsule (200 mg total) by mouth 2 (two) times daily as  needed for cough. 20 capsule 0  . BIOTIN PO Take 1 tablet by mouth every morning.    . Blood Glucose Monitoring Suppl (TRUE METRIX METER) w/Device KIT 1 each by Does not apply route daily. 1 kit 0  . Diclofenac Sodium 1.5 % SOLN Apply QID to knee 1 Bottle prn  . glipiZIDE (GLUCOTROL) 5 MG tablet Take 1 tablet (5 mg total) by mouth daily before supper. 30 tablet 3  . glucose blood (TRUETEST TEST) test strip Use to test blood sugar 2 times daily as instructed. Dx: E11.65 75 each 3  . meloxicam (MOBIC) 15 MG tablet Take 1 tablet (15 mg total) by mouth daily. 30 tablet 3  . metFORMIN (GLUCOPHAGE-XR) 750 MG 24 hr tablet Take 2 tablets (1,500 mg total) by mouth at bedtime. 60 tablet 2  . Multiple Vitamin (MULTIVITAMIN) tablet Take 1 tablet by mouth daily.    . Probiotic Product (PROBIOTIC PO) Take 1 capsule by mouth daily. Reported on 01/05/2016    . sertraline (ZOLOFT) 50 MG tablet Take 50 mg by mouth every morning.     . tamoxifen (NOLVADEX) 20 MG tablet Take 1 tablet (20 mg total) by mouth daily. 90 tablet 3   No current facility-administered medications on file prior to visit.   Allergies  Allergen Reactions  . Erythromycin Other (See Comments)    Stomach pain  . Tape Rash   Family History  Problem Relation Age of Onset  . Squamous cell carcinoma Father   . Cancer Father   . Diabetes Father   . Hypertension Mother   . Diabetes Sister    PE: BP 142/80 mmHg  Pulse 84  Ht _0  (1.702 m)  Wt 199 lb (90.266 kg)  BMI 31.16 kg/m2 Body mass index is 31.16 kg/(m^2). Wt Readings from Last 3 Encounters:  07/11/16 199 lb (90.266 kg)  05/16/16 199 lb 3.2 oz (90.357 kg)  04/11/16 198 lb 12.8 oz (90.175 kg)   Constitutional: overweight, in NAD Eyes: PERRLA, EOMI, no exophthalmos ENT: moist mucous membranes, no thyromegaly, no cervical lymphadenopathy Cardiovascular: RRR, No MRG Respiratory: CTA B Gastrointestinal: abdomen soft, NT, ND, BS+ Musculoskeletal: no deformities, strength intact  in all 4 Skin: moist, warm, no rashes Neurological: no tremor with outstretched hands, DTR normal in all 4  ASSESSMENT: 1. DM2, non-insulin-dependent, uncontrolled, without complications  PLAN:  1. Patient with long-standing, uncontrolled diabetes, on Metformin, which we moved at dinnertime. As sugars after dinner were high >> advised to start a glipizide 5 mg before this meal. She started this, but at bedtime Will advise her to move it before dinner. Sugars a little better. - We may need to add a DPP4 inh at next visit - I suggested to:  Patient Instructions  Please continue: - Metformin ER 1500 mg at dinnertime  Move Glipizide 5 mg  to before dinner.   Please return in 3 months with your sugar log.   - continue sugars at different times of the day - check 1-2 times a day, rotating checks - advised for yearly eye exams >> she is UTD - will need to recheck Lipids at next visit (high TG) - checked HbA1c now >> 7.3% (better) - Return to clinic in 3 mo with sugar log

## 2016-07-11 NOTE — Addendum Note (Signed)
Addended by: Caprice Beaver T on: 07/11/2016 04:43 PM   Modules accepted: Orders

## 2016-07-11 NOTE — Patient Instructions (Addendum)
Please continue: - Metformin ER 1500 mg at dinnertime  Move Glipizide 5 mg to before dinner.   Please return in 3 months with your sugar log.

## 2016-07-18 ENCOUNTER — Encounter: Payer: Self-pay | Admitting: Family Medicine

## 2016-07-19 ENCOUNTER — Other Ambulatory Visit: Payer: Self-pay | Admitting: Family Medicine

## 2016-07-19 DIAGNOSIS — Z0000 Encounter for general adult medical examination without abnormal findings: Secondary | ICD-10-CM

## 2016-07-19 DIAGNOSIS — E1165 Type 2 diabetes mellitus with hyperglycemia: Secondary | ICD-10-CM

## 2016-07-24 ENCOUNTER — Other Ambulatory Visit (INDEPENDENT_AMBULATORY_CARE_PROVIDER_SITE_OTHER): Payer: 59

## 2016-07-24 DIAGNOSIS — Z0000 Encounter for general adult medical examination without abnormal findings: Secondary | ICD-10-CM | POA: Diagnosis not present

## 2016-07-24 DIAGNOSIS — E1165 Type 2 diabetes mellitus with hyperglycemia: Secondary | ICD-10-CM | POA: Diagnosis not present

## 2016-07-24 LAB — LIPID PANEL
Cholesterol: 202 mg/dL — ABNORMAL HIGH (ref 0–200)
HDL: 44.3 mg/dL (ref >39.00)
NonHDL: 157.96
TRIGLYCERIDES: 297 mg/dL — AB (ref 0.0–149.0)
Total CHOL/HDL Ratio: 5
VLDL: 59.4 mg/dL — AB (ref 0.0–40.0)

## 2016-07-24 LAB — CBC WITH DIFFERENTIAL/PLATELET
Basophils Absolute: 0 10*3/uL (ref 0.0–0.1)
Basophils Relative: 0.4 % (ref 0.0–3.0)
EOS PCT: 1.7 % (ref 0.0–5.0)
Eosinophils Absolute: 0.1 10*3/uL (ref 0.0–0.7)
HEMATOCRIT: 36.5 % (ref 36.0–46.0)
Hemoglobin: 12 g/dL (ref 12.0–15.0)
LYMPHS PCT: 36.8 % (ref 12.0–46.0)
Lymphs Abs: 3 10*3/uL (ref 0.7–4.0)
MCHC: 32.8 g/dL (ref 30.0–36.0)
MCV: 82.7 fl (ref 78.0–100.0)
MONOS PCT: 4.1 % (ref 3.0–12.0)
Monocytes Absolute: 0.3 10*3/uL (ref 0.1–1.0)
Neutro Abs: 4.6 10*3/uL (ref 1.4–7.7)
Neutrophils Relative %: 57 % (ref 43.0–77.0)
Platelets: 228 10*3/uL (ref 150.0–400.0)
RBC: 4.42 Mil/uL (ref 3.87–5.11)
RDW: 14.2 % (ref 11.5–15.5)
WBC: 8 10*3/uL (ref 4.0–10.5)

## 2016-07-24 LAB — COMPREHENSIVE METABOLIC PANEL
ALK PHOS: 34 U/L — AB (ref 39–117)
ALT: 19 U/L (ref 0–35)
AST: 18 U/L (ref 0–37)
Albumin: 4.4 g/dL (ref 3.5–5.2)
BILIRUBIN TOTAL: 0.5 mg/dL (ref 0.2–1.2)
BUN: 20 mg/dL (ref 6–23)
CALCIUM: 9.9 mg/dL (ref 8.4–10.5)
CO2: 28 mEq/L (ref 19–32)
Chloride: 102 mEq/L (ref 96–112)
Creatinine, Ser: 0.67 mg/dL (ref 0.40–1.20)
GFR: 96.83 mL/min (ref >60.00)
GLUCOSE: 111 mg/dL — AB (ref 70–99)
POTASSIUM: 4 meq/L (ref 3.5–5.1)
Sodium: 140 mEq/L (ref 135–145)
TOTAL PROTEIN: 7.1 g/dL (ref 6.0–8.3)

## 2016-07-24 LAB — LDL CHOLESTEROL, DIRECT: Direct LDL: 112 mg/dL

## 2016-07-24 LAB — HEMOGLOBIN A1C: Hgb A1c MFr Bld: 7.6 % — ABNORMAL HIGH (ref 4.6–6.5)

## 2016-07-24 LAB — TSH: TSH: 2.19 u[IU]/mL (ref 0.35–4.50)

## 2016-07-25 ENCOUNTER — Ambulatory Visit (INDEPENDENT_AMBULATORY_CARE_PROVIDER_SITE_OTHER): Payer: 59 | Admitting: Family Medicine

## 2016-07-25 ENCOUNTER — Encounter: Payer: Self-pay | Admitting: Family Medicine

## 2016-07-25 ENCOUNTER — Ambulatory Visit (HOSPITAL_BASED_OUTPATIENT_CLINIC_OR_DEPARTMENT_OTHER): Payer: 59 | Admitting: Hematology and Oncology

## 2016-07-25 ENCOUNTER — Telehealth: Payer: Self-pay | Admitting: Hematology and Oncology

## 2016-07-25 ENCOUNTER — Encounter: Payer: Self-pay | Admitting: Hematology and Oncology

## 2016-07-25 VITALS — BP 132/76 | HR 84 | Temp 98.0°F | Ht 67.0 in | Wt 199.2 lb

## 2016-07-25 DIAGNOSIS — E1165 Type 2 diabetes mellitus with hyperglycemia: Secondary | ICD-10-CM | POA: Diagnosis not present

## 2016-07-25 DIAGNOSIS — T380X5A Adverse effect of glucocorticoids and synthetic analogues, initial encounter: Secondary | ICD-10-CM

## 2016-07-25 DIAGNOSIS — Z0000 Encounter for general adult medical examination without abnormal findings: Secondary | ICD-10-CM

## 2016-07-25 DIAGNOSIS — R739 Hyperglycemia, unspecified: Secondary | ICD-10-CM | POA: Diagnosis not present

## 2016-07-25 DIAGNOSIS — C50212 Malignant neoplasm of upper-inner quadrant of left female breast: Secondary | ICD-10-CM

## 2016-07-25 DIAGNOSIS — N951 Menopausal and female climacteric states: Secondary | ICD-10-CM | POA: Diagnosis not present

## 2016-07-25 MED FILL — TRUE METRIX GLUCOSE TEST ST: 50 days supply | Qty: 100 | Fill #2

## 2016-07-25 MED FILL — METFORMIN HCL ER 750 MG TAB: 750 | 30 days supply | Qty: 60 | Fill #2

## 2016-07-25 MED FILL — MELOXICAM 15 MG TABLET: 15 | 30 days supply | Qty: 30 | Fill #3

## 2016-07-25 MED FILL — glipiZIDE 5 MG TABS: 5 | 30 days supply | Qty: 30 | Fill #2

## 2016-07-25 NOTE — Assessment & Plan Note (Signed)
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 Occasional hot flashes : I discussed different measures including yoga exercise as well as medications including Effexor and Neurontin. she is already on Zoloft , I recommended her to continue with the same for now.  Breast cancer surveillance: 1. Mammogram done 12/21/2015 is normal, breath density category B 2. Breast exam 07/25/2016: Benign   Ring worm infection: failed topical therapy. Prescribed itraconazole 200 mg daily X 7 days, this resolved her infection  Return to clinic in 1 year for follow-up

## 2016-07-25 NOTE — Assessment & Plan Note (Signed)
Reviewed preventive care protocols, scheduled due services, and updated immunizations Discussed nutrition, exercise, diet, and healthy lifestyle.  

## 2016-07-25 NOTE — Telephone Encounter (Signed)
appt made and avs printed °

## 2016-07-25 NOTE — Progress Notes (Signed)
Pre visit review using our clinic review tool, if applicable. No additional management support is needed unless otherwise documented below in the visit note. 

## 2016-07-25 NOTE — Progress Notes (Signed)
Subjective:   Patient ID: Veronica Rogers, female    DOB: February 27, 1961, 55 y.o.   MRN: [id number]  Veronica Rogers is a pleasant 55 y.o. year old female who presents to clinic today with well woman  on 07/25/2016  HPI:  Pap smear 09/2015- Dr. Stann Mainland is OBGYN. Mammogram 12/21/15 Colonoscopy 05/31/11  DM- followed by Dr. Cruzita Lederer.  Note reviewed from 07/11/16 Glipizide 5 mg before dinner was added to Metformin in 03/2016 and this month, she advised her to move it before dinner as she had been taking glipizide at bedtime and having high sugars after dinner.  Advised f/u in 3 months.   Breast cancer- followed by Dr. Lindi Adie.  Last was seen on 01/26/16- note reviewed. Advised to continue Tamoxifen and zoloft added for side effects. Advised follow up in 6 months which is scheduled for today.  Lab Results  Component Value Date   CHOL 202 (H) 07/24/2016   HDL 44.30 07/24/2016   LDLCALC 63 02/27/2015   LDLDIRECT 112.0 07/24/2016   TRIG 297.0 (H) 07/24/2016   CHOLHDL 5 07/24/2016   Lab Results  Component Value Date   CREATININE 0.67 07/24/2016   Lab Results  Component Value Date   TSH 2.19 07/24/2016   Lab Results  Component Value Date   WBC 8.0 07/24/2016   HGB 12.0 07/24/2016   HCT 36.5 07/24/2016   MCV 82.7 07/24/2016   PLT 228.0 07/24/2016   Lab Results  Component Value Date   NA 140 07/24/2016   K 4.0 07/24/2016   CL 102 07/24/2016   CO2 28 07/24/2016   Lab Results  Component Value Date   ALT 19 07/24/2016   AST 18 07/24/2016   ALKPHOS 34 (L) 07/24/2016   BILITOT 0.5 07/24/2016   Current Outpatient Prescriptions on File Prior to Visit  Medication Sig Dispense Refill  . BIOTIN PO Take 1 tablet by mouth every morning.    . Blood Glucose Monitoring Suppl (TRUE METRIX METER) w/Device KIT 1 each by Does not apply route daily. 1 kit 0  . Diclofenac Sodium 1.5 % SOLN Apply QID to knee 1 Bottle prn  . glipiZIDE (GLUCOTROL) 5 MG tablet Take 1 tablet (5 mg total) by mouth  daily before supper. 30 tablet 3  . glucose blood (TRUETEST TEST) test strip Use to test blood sugar 2 times daily as instructed. Dx: E11.65 75 each 3  . meloxicam (MOBIC) 15 MG tablet Take 1 tablet (15 mg total) by mouth daily. 30 tablet 3  . metFORMIN (GLUCOPHAGE-XR) 750 MG 24 hr tablet Take 2 tablets (1,500 mg total) by mouth at bedtime. 60 tablet 2  . Multiple Vitamin (MULTIVITAMIN) tablet Take 1 tablet by mouth daily.    . Probiotic Product (PROBIOTIC PO) Take 1 capsule by mouth daily. Reported on 01/05/2016    . sertraline (ZOLOFT) 50 MG tablet Take 50 mg by mouth every morning.     . tamoxifen (NOLVADEX) 20 MG tablet Take 1 tablet (20 mg total) by mouth daily. 90 tablet 3   No current facility-administered medications on file prior to visit.     Allergies  Allergen Reactions  . Erythromycin Other (See Comments)    Stomach pain  . Tape Rash    Past Medical History:  Diagnosis Date  . Anxiety   . Breast cancer ([place])   . Contact lens/glasses fitting    wears contacts or glasses  . Depression   . Diabetes ([place]) 11/22/2014  . Hyperlipidemia   .  Radiation 03/28/14-05/06/14   Left Breast 60 Gy    Past Surgical History:  Procedure Laterality Date  . BREAST BIOPSY  1996   right breast, benign  . BREAST LUMPECTOMY WITH NEEDLE LOCALIZATION AND AXILLARY SENTINEL LYMPH NODE BX Left 10/29/2013   Procedure: BREAST LUMPECTOMY WITH NEEDLE LOCALIZATION AND AXILLARY SENTINEL LYMPH NODE BX;  Surgeon: Rolm Bookbinder, MD;  Location: [place];  Service: General;  Laterality: Left;  . COLONOSCOPY  05/2011   WNL  . WISDOM TOOTH EXTRACTION      Family History  Problem Relation Age of Onset  . Squamous cell carcinoma Father   . Cancer Father   . Diabetes Father   . Hypertension Mother   . Diabetes Sister     Social History   Social History  . Marital status: Married    Spouse name: N/A  . Number of children: N/A  . Years of education: N/A   Occupational History    . Not on file.   Social History Main Topics  . Smoking status: Never Smoker  . Smokeless tobacco: Never Used  . Alcohol use Yes     Comment: 1-2 drinks a month  . Drug use: No  . Sexual activity: Yes   Other Topics Concern  . Not on file   Social History Narrative  . No narrative on file   The PMH, PSH, Social History, Family History, Medications, and allergies have been reviewed in [facility], and have been updated if relevant.   Review of Systems  Constitutional: Negative.   HENT: Negative.   Respiratory: Negative.   Cardiovascular: Negative.   Gastrointestinal: Negative.   Endocrine: Negative.   Genitourinary: Negative.   Musculoskeletal: Negative.   Allergic/Immunologic: Negative.   Neurological: Negative.   Hematological: Negative.   Psychiatric/Behavioral: Negative.   All other systems reviewed and are negative.      Objective:    BP 132/76   Pulse 84   Temp 98 F (36.7 C) (Oral)   Ht _0  (1.702 m)   Wt 199 lb 4 oz (90.4 kg)   SpO2 97%   BMI 31.21 kg/m    Physical Exam   General:  Well-developed,well-nourished,in no acute distress; alert,appropriate and cooperative throughout examination Head:  normocephalic and atraumatic.   Eyes:  vision grossly intact, pupils equal, pupils round, and pupils reactive to light.   Ears:  R ear normal and L ear normal.   Nose:  no external deformity.   Mouth:  good dentition.   Neck:  No deformities, masses, or tenderness noted. Lungs:  Normal respiratory effort, chest expands symmetrically. Lungs are clear to auscultation, no crackles or wheezes. Heart:  Normal rate and regular rhythm. S1 and S2 normal without gallop, murmur, click, rub or other extra sounds. Abdomen:  Bowel sounds positive,abdomen soft and non-tender without masses, organomegaly or hernias noted. Msk:  No deformity or scoliosis noted of thoracic or lumbar spine.   Extremities:  No clubbing, cyanosis, edema, or deformity noted with normal full range  of motion of all joints.   Neurologic:  alert & oriented X3 and gait normal.   Skin:  Intact without suspicious lesions or rashes Cervical Nodes:  No lymphadenopathy noted Axillary Nodes:  No palpable lymphadenopathy Psych:  Cognition and judgment appear intact. Alert and cooperative with normal attention span and concentration. No apparent delusions, illusions, hallucinations       Assessment & Plan:   Type 2 diabetes mellitus with hyperglycemia, without long-term current use of insulin (  [place])  Routine general medical examination at a health care facility  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[place])  Steroid-induced hyperglycemia No Follow-up on file.

## 2016-07-25 NOTE — Assessment & Plan Note (Signed)
Has appt with oncology today.

## 2016-07-25 NOTE — Patient Instructions (Signed)
Great to see you. Have a happy birthday!  Talk to your oncologist about the pneumovax and if you want- gabapentin.

## 2016-07-25 NOTE — Progress Notes (Signed)
Patient Care Team: Lucille Passy, MD as PCP - General (Family Medicine) Dimitri Ped, RN as [facility]  DIAGNOSIS: Breast cancer of upper-inner quadrant of left female breast [facility])   Staging form: Breast, AJCC 7th Edition   - Clinical: Stage IA (T1b, N0, cM0) - Unsigned         Staging comments: Staged at breast conference 10.22.14    - Pathologic: Stage IIA (T1b, N1a, cM0) - Signed by Thea Silversmith, MD on 04/26/2014  SUMMARY OF ONCOLOGIC HISTORY:   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[place])   10/29/2013 Surgery    left lumpectomy: 0.9 cm invasive ductal carcinoma, grade 2, ER/PR positive HER-2 negative, 1 sentinel node positive for micrometastatic, Oncotype DX recurrence score 29, 17% ROR     12/07/2013 - 02/08/2014 Chemotherapy    Taxotere and Cytoxan 4 adjuvant chemotherapy     03/30/2014 - 05/06/2014 Radiation Therapy    adjuvant radiation therapy     05/20/2014 -  Anti-estrogen oral therapy    tamoxifen 20 mg daily      CHIEF COMPLIANT: Follow-up on tamoxifen  INTERVAL HISTORY: Veronica Rogers is a 55 year old with above-mentioned history of left breast cancer status post lumpectomy adjuvant chemotherapy and radiation and is currently on tamoxifen. She is tolerating fairly well. She doesn't moderate degree of heart flashes which is not bothering her.   REVIEW OF SYSTEMS:   Constitutional: Denies fevers, chills or abnormal weight loss Eyes: Denies blurriness of vision Ears, nose, mouth, throat, and face: Denies mucositis or sore throat Respiratory: Denies cough, dyspnea or wheezes Cardiovascular: Denies palpitation, chest discomfort Gastrointestinal:  Denies nausea, heartburn or change in bowel habits Skin: Denies abnormal skin rashes Lymphatics: Denies new lymphadenopathy or easy bruising Neurological:Denies numbness, tingling or new weaknesses Behavioral/Psych: Mood is stable, no new changes  Extremities: No  lower extremity edema Breast:  denies any pain or lumps or nodules in either breasts All other systems were reviewed with the patient and are negative.  I have reviewed the past medical history, past surgical history, social history and family history with the patient and they are unchanged from previous note.  ALLERGIES:  is allergic to erythromycin and tape.  MEDICATIONS:  Current Outpatient Prescriptions  Medication Sig Dispense Refill  . BIOTIN PO Take 1 tablet by mouth every morning.    . Blood Glucose Monitoring Suppl (TRUE METRIX METER) w/Device KIT 1 each by Does not apply route daily. 1 kit 0  . Diclofenac Sodium 1.5 % SOLN Apply QID to knee 1 Bottle prn  . glipiZIDE (GLUCOTROL) 5 MG tablet Take 1 tablet (5 mg total) by mouth daily before supper. 30 tablet 3  . glucose blood (TRUETEST TEST) test strip Use to test blood sugar 2 times daily as instructed. Dx: E11.65 75 each 3  . meloxicam (MOBIC) 15 MG tablet Take 1 tablet (15 mg total) by mouth daily. 30 tablet 3  . metFORMIN (GLUCOPHAGE-XR) 750 MG 24 hr tablet Take 2 tablets (1,500 mg total) by mouth at bedtime. 60 tablet 2  . Multiple Vitamin (MULTIVITAMIN) tablet Take 1 tablet by mouth daily.    . Probiotic Product (PROBIOTIC PO) Take 1 capsule by mouth daily. Reported on 01/05/2016    . sertraline (ZOLOFT) 50 MG tablet Take 50 mg by mouth every morning.     . tamoxifen (NOLVADEX) 20 MG tablet Take 1 tablet (20 mg total) by mouth daily. 90 tablet 3   No current  facility-administered medications for this visit.     PHYSICAL EXAMINATION: ECOG PERFORMANCE STATUS: 0 - Asymptomatic  Vitals:   07/25/16 1459  BP: 135/82  Pulse: 87  Resp: 18  Temp: 98.1 F (36.7 C)   Filed Weights   07/25/16 1459  Weight: 199 lb (90.3 kg)    GENERAL:alert, no distress and comfortable SKIN: skin color, texture, turgor are normal, no rashes or significant lesions EYES: normal, Conjunctiva are pink and non-injected, sclera  clear OROPHARYNX:no exudate, no erythema and lips, buccal mucosa, and tongue normal  NECK: supple, thyroid normal size, non-tender, without nodularity LYMPH:  no palpable lymphadenopathy in the cervical, axillary or inguinal LUNGS: clear to auscultation and percussion with normal breathing effort HEART: regular rate & rhythm and no murmurs and no lower extremity edema ABDOMEN:abdomen soft, non-tender and normal bowel sounds MUSCULOSKELETAL:no cyanosis of digits and no clubbing  NEURO: alert & oriented x 3 with fluent speech, no focal motor/sensory deficits EXTREMITIES: No lower extremity edema BREAST: No palpable masses or nodules in either right or left breasts. No palpable axillary supraclavicular or infraclavicular adenopathy no breast tenderness or nipple discharge. (exam performed in the presence of a chaperone)  LABORATORY DATA:  I have reviewed the data as listed   Chemistry      Component Value Date/Time   NA 140 07/24/2016 1302   NA 141 01/17/2015 1404   K 4.0 07/24/2016 1302   K 4.1 01/17/2015 1404   CL 102 07/24/2016 1302   CO2 28 07/24/2016 1302   CO2 28 01/17/2015 1404   BUN 20 07/24/2016 1302   BUN 16.9 01/17/2015 1404   CREATININE 0.67 07/24/2016 1302   CREATININE 0.69 04/11/2016 1506   CREATININE 0.7 01/17/2015 1404      Component Value Date/Time   CALCIUM 9.9 07/24/2016 1302   CALCIUM 9.3 01/17/2015 1404   ALKPHOS 34 (L) 07/24/2016 1302   ALKPHOS 43 01/17/2015 1404   AST 18 07/24/2016 1302   AST 16 01/17/2015 1404   ALT 19 07/24/2016 1302   ALT 17 01/17/2015 1404   BILITOT 0.5 07/24/2016 1302   BILITOT 0.28 01/17/2015 1404       Lab Results  Component Value Date   WBC 8.0 07/24/2016   HGB 12.0 07/24/2016   HCT 36.5 07/24/2016   MCV 82.7 07/24/2016   PLT 228.0 07/24/2016   NEUTROABS 4.6 07/24/2016    ASSESSMENT & PLAN:  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 Occasional hot flashes : I discussed different measures including yoga exercise as well as medications including Effexor and Neurontin. she is already on Zoloft , I recommended her to continue with the same for now.  Breast cancer surveillance: 1. Mammogram done 12/21/2015 is normal, breath density category B 2. Breast exam 07/25/2016: Benign   Ring worm infection: failed topical therapy. Prescribed itraconazole 200 mg daily X 7 days, this resolved her infection  Return to clinic in 1 year for follow-up   No orders of the defined types were placed in this encounter.  The patient has a good understanding of the overall plan. she agrees with it. she will call with any  problems that may develop before the next visit here.   Rulon Eisenmenger, MD 07/25/16

## 2016-07-25 NOTE — Assessment & Plan Note (Signed)
Improving control. Followed by endo. 

## 2016-08-22 ENCOUNTER — Other Ambulatory Visit: Payer: Self-pay

## 2016-08-22 VITALS — BP 132/70 | HR 82 | Resp 14 | Ht 68.0 in | Wt 202.0 lb

## 2016-08-22 DIAGNOSIS — E119 Type 2 diabetes mellitus without complications: Secondary | ICD-10-CM

## 2016-08-22 MED FILL — glipiZIDE 5 MG TABS: 5 | 30 days supply | Qty: 30 | Fill #3

## 2016-08-22 MED FILL — SERTRALINE HCL 50 MG TABLET: 50 | 90 days supply | Qty: 90 | Fill #1

## 2016-08-22 MED FILL — TAMOXIFEN 20 MG TABLET: 20 | 90 days supply | Qty: 90 | Fill #1

## 2016-08-22 NOTE — Patient Outreach (Signed)
[facility]) Care Management   08/22/2016  Veronica Rogers [birth year] [id number]  Veronica Rogers is an 55 y.o. female.   Member seen for follow up office visit for Link to Wellness program for self management of Type 2 diabetes  Subjective: Member states that her blood sugars have been improving since she started taking her glipizide before supper and she is taking everyday. States her fasting sugars range from 130-150. States she has not been going to the gym much this summer but she goes swimming and she has a new bike that she is riding.   States that her hemoglobin A1C was down to 7.4% when she saw Dr. Cruzita Lederer in July.  States she has been feeling a little sad since her daughter moved to [place] but does not feel depressed.  Objective:   Review of Systems  Musculoskeletal: Positive for joint pain.    Physical Exam Today's Vitals   08/22/16 1612  BP: 132/70  Pulse: 82  Resp: 14  SpO2: 96%  Weight: 202 lb (91.6 kg)  Height: 1.727 m ('5\' 8"'$ )  PainSc: 0-No pain   Encounter Medications:   Outpatient Encounter Prescriptions as of 08/22/2016  Medication Sig  . BIOTIN PO Take 1 tablet by mouth every morning.  . Blood Glucose Monitoring Suppl (TRUE METRIX METER) w/Device KIT 1 each by Does not apply route daily.  . Diclofenac Sodium 1.5 % SOLN Apply QID to knee  . glipiZIDE (GLUCOTROL) 5 MG tablet Take 1 tablet (5 mg total) by mouth daily before supper.  Marland Kitchen glucose blood (TRUETEST TEST) test strip Use to test blood sugar 2 times daily as instructed. Dx: E11.65  . meloxicam (MOBIC) 15 MG tablet Take 1 tablet (15 mg total) by mouth daily.  . metFORMIN (GLUCOPHAGE-XR) 750 MG 24 hr tablet Take 2 tablets (1,500 mg total) by mouth at bedtime.  . Multiple Vitamin (MULTIVITAMIN) tablet Take 1 tablet by mouth daily.  . Probiotic Product (PROBIOTIC PO) Take 1 capsule by mouth daily. Reported on 01/05/2016  . sertraline (ZOLOFT) 50 MG tablet Take 50 mg by mouth every morning.    . tamoxifen (NOLVADEX) 20 MG tablet Take 1 tablet (20 mg total) by mouth daily.   No facility-administered encounter medications on file as of 08/22/2016.     Functional Status:   In your present state of health, do you have any difficulty performing the following activities: 05/16/2016 01/26/2016  Hearing? N N  Vision? N N  Difficulty concentrating or making decisions? N N  Walking or climbing stairs? N N  Dressing or bathing? N N  Doing errands, shopping? N N  Some recent data might be hidden    Fall/Depression Screening:    PHQ 2/9 Scores 08/22/2016 05/16/2016 01/26/2016 11/30/2015 06/27/2015  PHQ - 2 Score 0 0 0 0 0    Assessment:  Member seen for follow up office visit for Link to Wellness program for self management of Type 2 diabetes. Member is above diabetes self management goal of 7% with last reading of 7.4% but it is improved from 7.8%   Member is now taking her  glipizide before supper everyday as directed by MD.  Member given a large pill planner to help remind her to take her medications.   She has follow up with endocrinologist on 10/11/16. Member did not bring glucometer to meeting but reports AM CBGs range 130-150. Member is riding her bike or swimming several times a week and  walking on weekends.  Member  is up to date on annual eye exam.   Plan:  Plan to eat 30-45 GM (2-3) servings of carbohydrate a meal and 15 GM for snacks.  Plan to eat protein with your snacks Plan to check blood sugar 1-2 times a day either fasting or 1 -2hrs after a meal.  Goals of 80-130 fasting and 180 or less after eating. Plan to ride bike 3 days a week 15-30 minutes.  Plan to walk 30 minutes over the weekend.  Plan to continue to work in garden Plan to complete EMMI by 11/29/16 Plan to see Dr. Cruzita Lederer on 10/11/16 Plan to return to Link to Wellness on 11/26/16 at [place] Problem One   Flowsheet Row Most Recent Value  Care Plan Problem One  Elevated blood sugars related to dx  of Type 2 DM  Role Documenting the Problem One  Care Management [place] for Problem One  Active  [facility] Long Term Goal (31-90 days)  Member will decrease hemoglobin A1C to 7 or below in next 90 days  [facility] Long Term Goal Start Date  08/22/16 [Continue hemoglobin A1C down to 7.4%]  Interventions for Problem One Long Term Goal   Reviewed CHO counting and portion control, Reinforced blood sugar goals and target hemoglobin A1C level, Reinforced to take her glipizide with food in the evening everyday as directed by MD Given large pill box and instructed on use,, Reinforced on s/s of hypoglycemia, the rule of 15 and actions to take, Reinforced to check blood sugar a different times as instructed by MD and to take her log on her 07/11/16 visit, Given brouchure for the employee assistance counseling program and encouraged to call if she has difficulty with dealing with her daugher's move,   Reinforced importance of regular exercise to glycemic control,    Peter Garter RN, [facility] Care Management Coordinator-Link to [place] Management [phone]

## 2016-08-23 NOTE — Patient Instructions (Signed)
1. Plan to eat 30-45 GM (2-3) servings of carbohydrate a meal and 15 GM for snacks.  Plan to eat protein with your snacks 2. Plan to check blood sugar 1-2 times a day either fasting or 1 -2hrs after a meal.  Goals of 80-130 fasting and 180 or less after eating. 3. Plan to ride bike 3 days a week 15-30 minutes.  Plan to walk 30 minutes over the weekend.  Plan to continue to work in garden 4. Plan to complete EMMI by 11/29/16 5. Plan to see Dr. Cruzita Lederer on 10/11/16 6. Plan to return to Link to Wellness on 11/26/16 at [facility]

## 2016-09-03 ENCOUNTER — Other Ambulatory Visit: Payer: Self-pay | Admitting: Internal Medicine

## 2016-09-03 MED FILL — TRUE METRIX GLUCOSE TEST ST: 50 days supply | Qty: 100 | Fill #3

## 2016-09-03 MED FILL — METFORMIN HCL ER 750 MG TAB: 750 | 30 days supply | Qty: 60 | Fill #0

## 2016-10-03 ENCOUNTER — Other Ambulatory Visit: Payer: Self-pay | Admitting: Internal Medicine

## 2016-10-03 DIAGNOSIS — E1165 Type 2 diabetes mellitus with hyperglycemia: Secondary | ICD-10-CM

## 2016-10-03 MED FILL — METFORMIN HCL ER 750 MG TAB: 750 | 30 days supply | Qty: 60 | Fill #1

## 2016-10-04 MED FILL — glipiZIDE 5 MG TABS: 5 | 30 days supply | Qty: 30 | Fill #0

## 2016-10-11 ENCOUNTER — Encounter: Payer: Self-pay | Admitting: Internal Medicine

## 2016-10-11 ENCOUNTER — Ambulatory Visit (INDEPENDENT_AMBULATORY_CARE_PROVIDER_SITE_OTHER): Payer: 59 | Admitting: Internal Medicine

## 2016-10-11 VITALS — BP 130/74 | HR 87 | Ht 67.5 in | Wt 204.0 lb

## 2016-10-11 DIAGNOSIS — E1165 Type 2 diabetes mellitus with hyperglycemia: Secondary | ICD-10-CM | POA: Diagnosis not present

## 2016-10-11 LAB — POCT GLYCOSYLATED HEMOGLOBIN (HGB A1C): HEMOGLOBIN A1C: 7

## 2016-10-11 NOTE — Progress Notes (Signed)
Patient ID: Veronica Rogers, female   DOB: 02/22/1961, 55 y.o.   MRN: [id number]   HPI: Veronica Rogers is a 55 y.o.-year-old female, returning for f/u for DM2, dx in 2015 (after steroids), non-insulin-dependent, uncontrolled, without complications. Last visit 3 mo ago.  Last hemoglobin A1c was: Lab Results  Component Value Date   HGBA1C 7.6 (H) 07/24/2016   HGBA1C 7.3 07/11/2016   HGBA1C 7.8 04/11/2016   Pt is on a regimen of: - Metformin ER 1500 mg with dinner - Glipizide 5 mg before dinner - added 03/2006  She was on insulin during ChTx. She was on regular metformin >> GI upset (diarrhea).  Pt checks her sugars - but not recently >> lost meter. Reviewed sugars from last visit: - am: 131, 142-179 >> 139-179 >> 126, 163-193 >> 132-169, 192 (candy) - 2h after b'fast: 227 (cereal) >> 120-135 >> 75 (after glimepiride), 132-168 >> n/c - before lunch: n/c >> 174 >> n/c - 2h after lunch: n/c >> 92, 132-148 >> 176 - before dinner: n/c >> 124 >> 91-128, 160 - 2h after dinner: n/c >> 182, 254 >> 156, 172, 231 - bedtime: n/c - nighttime: n/c >> 82  No lows. Lowest sugar was 131 >> 92 >> 126; she has hypoglycemia awareness at 70.  Highest sugar was 274 >> 179 >> 254 >> 231.  Glucometer: True test  Pt's meals are: - Breakfast: cereals or 2 eggs or skips - 10:30 am: PB crackers - Lunch: leftovers from home: sandwich; soup; meat + veggies; chinese; salad - Dinner: same as lunch [employer] - 2x a week.  - no CKD, last BUN/creatinine:  Lab Results  Component Value Date   BUN 20 07/24/2016   CREATININE 0.67 07/24/2016   - last set of lipids: Lab Results  Component Value Date   CHOL 202 (H) 07/24/2016   HDL 44.30 07/24/2016   LDLCALC 63 02/27/2015   LDLDIRECT 112.0 07/24/2016   TRIG 297.0 (H) 07/24/2016   CHOLHDL 5 07/24/2016   - last eye exam was in 03/26/2016. No DR. Dr Bing Plume. - no numbness and tingling in her feet. Had plantar fasciitis. Has some numbness in L  heel.  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 On Tamoxifen for 5 years >> up to 2020. She has hot flushes.  ROS: Constitutional: no weight gain/loss, no fatigue, no subjective hyperthermia/hypothermia Eyes: no blurry vision, no xerophthalmia ENT: no sore throat, no nodules palpated in throat, no dysphagia/odynophagia, no hoarseness Cardiovascular: no CP/SOB/palpitations/leg swelling Respiratory: no cough/SOB Gastrointestinal: no N/V/D/C Musculoskeletal: no muscle/joint aches Skin: no rashes Neurological: no tremors/numbness/tingling/dizziness  I reviewed pt's medications, allergies, PMH, social hx, family hx, and changes were documented in the history of present illness. Otherwise, unchanged from my initial visit note.  Past Medical History:  Diagnosis Date  . Anxiety   . Breast cancer ([place])   . Contact lens/glasses fitting    wears contacts or glasses  . Depression   . Diabetes ([place]) 11/22/2014  . Hyperlipidemia   . Radiation 03/28/14-05/06/14   Left Breast 60 Gy   Past Surgical History:  Procedure Laterality Date  . BREAST BIOPSY  1996   right breast, benign  . BREAST LUMPECTOMY WITH NEEDLE LOCALIZATION AND AXILLARY SENTINEL LYMPH NODE BX Left 10/29/2013   Procedure: BREAST LUMPECTOMY WITH NEEDLE LOCALIZATION AND AXILLARY SENTINEL LYMPH NODE BX;  Surgeon: Rolm Bookbinder, MD;  Location: [place];  Service: General;  Laterality: Left;  . COLONOSCOPY  05/2011   WNL  . WISDOM  TOOTH EXTRACTION     Social History   Social History  . Marital Status: Married    Spouse Name: N/A  . Number of Children: 2   Occupational History  . [employer] - support rep   Social History Main Topics  . Smoking status: Never Smoker   . Smokeless tobacco: Never Used  . Alcohol Use: Yes     Comment: 1-2 drinks a month  . Drug Use: No   Current Outpatient Prescriptions on File Prior to Visit  Medication Sig Dispense Refill  . BIOTIN PO Take 1 tablet by mouth every  morning.    . Blood Glucose Monitoring Suppl (TRUE METRIX METER) w/Device KIT 1 each by Does not apply route daily. 1 kit 0  . Diclofenac Sodium 1.5 % SOLN Apply QID to knee 1 Bottle prn  . glipiZIDE (GLUCOTROL) 5 MG tablet TAKE 1 TABLET BY MOUTH DAILY BEFORE SUPPER. 30 tablet 3  . glucose blood (TRUETEST TEST) test strip Use to test blood sugar 2 times daily as instructed. Dx: E11.65 75 each 3  . meloxicam (MOBIC) 15 MG tablet Take 1 tablet (15 mg total) by mouth daily. 30 tablet 3  . metFORMIN (GLUCOPHAGE-XR) 750 MG 24 hr tablet TAKE 2 TABLETS BY MOUTH DAILY AT BEDTIME 60 tablet 2  . Multiple Vitamin (MULTIVITAMIN) tablet Take 1 tablet by mouth daily.    . Probiotic Product (PROBIOTIC PO) Take 1 capsule by mouth daily. Reported on 01/05/2016    . sertraline (ZOLOFT) 50 MG tablet Take 50 mg by mouth every morning.     . tamoxifen (NOLVADEX) 20 MG tablet Take 1 tablet (20 mg total) by mouth daily. 90 tablet 3   No current facility-administered medications on file prior to visit.    Allergies  Allergen Reactions  . Erythromycin Other (See Comments)    Stomach pain  . Tape Rash   Family History  Problem Relation Age of Onset  . Squamous cell carcinoma Father   . Cancer Father   . Diabetes Father   . Hypertension Mother   . Diabetes Sister    PE: BP 130/74   Pulse 87   Ht 5' 7.5" (1.715 m)   Wt 204 lb (92.5 kg)   BMI 31.48 kg/m  Body mass index is 31.48 kg/m. Wt Readings from Last 3 Encounters:  10/11/16 204 lb (92.5 kg)  08/22/16 202 lb (91.6 kg)  07/25/16 199 lb (90.3 kg)   Constitutional: overweight, in NAD Eyes: PERRLA, EOMI, no exophthalmos ENT: moist mucous membranes, no thyromegaly, no cervical lymphadenopathy Cardiovascular: RRR, No MRG Respiratory: CTA B Gastrointestinal: abdomen soft, NT, ND, BS+ Musculoskeletal: no deformities, strength intact in all 4 Skin: moist, warm, no rashes Neurological: no tremor with outstretched hands, DTR normal in all  4  ASSESSMENT: 1. DM2, non-insulin-dependent, uncontrolled, without complications  PLAN:  1. Patient with long-standing, uncontrolled diabetes, on Metformin + Glipizide. As sugars after dinner were high >> advised to start a glipizide 5 mg before this meal. She started this, but at bedtime >> moved it before dinner at last visit. Sugars a little better, but no recent checks. - We may need to add a DPP4 inh in the future, but not for now - I suggested to:  Patient Instructions  Please continue: - Metformin ER 1500 mg at dinnertime - Glipizide 5 mg to before dinner.   Please return in 3 months with your sugar log.   KEEP UP THE GREAT WORK!  -  continue sugars at different times of the day - check 1x a day, rotating checks >> she will need a new meter (True Metrix) - advised for yearly eye exams >> she is UTD - checked HbA1c now >> 7.0% (better) - Return to clinic in 3 mo with sugar log   Philemon Kingdom, MD PhD [facility] Endocrinology

## 2016-10-11 NOTE — Addendum Note (Signed)
Addended by: Nile Riggs on: 10/11/2016 05:03 PM   Modules accepted: Orders

## 2016-10-11 NOTE — Patient Instructions (Signed)
Please continue: - Metformin ER 1500 mg at dinnertime - Glipizide 5 mg to before dinner.   Please return in 3 months with your sugar log.   KEEP UP THE GREAT WORK!

## 2016-10-28 ENCOUNTER — Other Ambulatory Visit: Payer: Self-pay | Admitting: Obstetrics & Gynecology

## 2016-10-28 DIAGNOSIS — Z124 Encounter for screening for malignant neoplasm of cervix: Secondary | ICD-10-CM | POA: Diagnosis not present

## 2016-10-28 DIAGNOSIS — Z01419 Encounter for gynecological examination (general) (routine) without abnormal findings: Secondary | ICD-10-CM | POA: Diagnosis not present

## 2016-10-28 DIAGNOSIS — Z6831 Body mass index (BMI) 31.0-31.9, adult: Secondary | ICD-10-CM | POA: Diagnosis not present

## 2016-10-31 LAB — CYTOLOGY - PAP

## 2016-11-05 ENCOUNTER — Other Ambulatory Visit: Payer: Self-pay | Admitting: Internal Medicine

## 2016-11-05 ENCOUNTER — Other Ambulatory Visit: Payer: Self-pay | Admitting: Family Medicine

## 2016-11-05 MED FILL — METFORMIN HCL ER 750 MG TAB: 750 | 30 days supply | Qty: 60 | Fill #2

## 2016-11-05 MED FILL — glipiZIDE 5 MG TABS: 5 | 30 days supply | Qty: 30 | Fill #1

## 2016-11-05 MED FILL — TRUE METRIX GLUCOSE TEST ST: 50 days supply | Qty: 100 | Fill #0

## 2016-11-05 MED FILL — MELOXICAM 15 MG TABLET: 15 | 30 days supply | Qty: 30 | Fill #0

## 2016-11-05 NOTE — Telephone Encounter (Signed)
Last office visit 07/25/2016.  Last refilled 11/06/2015 for #30 with 3 refills by Dr. Lorelei Pont.  Ok to refill?

## 2016-11-26 ENCOUNTER — Other Ambulatory Visit: Payer: Self-pay

## 2016-11-26 VITALS — BP 118/80 | HR 86 | Resp 14 | Ht 68.0 in | Wt 208.6 lb

## 2016-11-26 DIAGNOSIS — E119 Type 2 diabetes mellitus without complications: Secondary | ICD-10-CM

## 2016-11-26 MED FILL — glipiZIDE 5 MG TABS: 5 | 30 days supply | Qty: 30 | Fill #2

## 2016-11-26 MED FILL — SERTRALINE HCL 50 MG TABLET: 50 | 90 days supply | Qty: 90 | Fill #2

## 2016-11-26 MED FILL — TAMOXIFEN 20 MG TABLET: 20 | 90 days supply | Qty: 90 | Fill #2

## 2016-11-26 MED FILL — MELOXICAM 15 MG TABLET: 15 | 30 days supply | Qty: 30 | Fill #1

## 2016-11-26 NOTE — Patient Outreach (Signed)
[facility]) Care Management   11/26/2016  Veronica Rogers Feb 22, 1961 [id number]  Veronica Rogers is an 55 y.o. female.   Member seen for follow up office visit for Link to Wellness program for self management of Type 2 diabetes  Subjective: Member states that she has been eating more over the holidays and she got out of the habit of exercising.  STates she has started back going to the gym 2-3 times a week.  States her morning blood sugars are still her higher readings.  States she is doing better with remembering to take her glipizide with supper and she is using the medication box.  States she has been eating cake or cookies as a bedtime snack. Objective:   Review of Systems  All other systems reviewed and are negative. Reviewed glucometer 7 day average-174 14 day average-170 30 day average-144  Physical Exam Today's Vitals   11/26/16 1616 11/26/16 1621  BP: 118/80   Pulse: 86   Resp: 14   SpO2: 94%   Weight: 208 lb 9.6 oz (94.6 kg)   Height: 1.727 m (5' 8")   PainSc: 0-No pain 0-No pain   Encounter Medications:   Outpatient Encounter Prescriptions as of 11/26/2016  Medication Sig  . BIOTIN PO Take 1 tablet by mouth every morning.  . Blood Glucose Monitoring Suppl (TRUE METRIX METER) w/Device KIT 1 each by Does not apply route daily.  . Diclofenac Sodium 1.5 % SOLN Apply QID to knee  . glipiZIDE (GLUCOTROL) 5 MG tablet TAKE 1 TABLET BY MOUTH DAILY BEFORE SUPPER.  . meloxicam (MOBIC) 15 MG tablet TAKE 1 TABLET BY MOUTH DAILY.  . metFORMIN (GLUCOPHAGE-XR) 750 MG 24 hr tablet TAKE 2 TABLETS BY MOUTH DAILY AT BEDTIME  . Multiple Vitamin (MULTIVITAMIN) tablet Take 1 tablet by mouth daily.  . sertraline (ZOLOFT) 50 MG tablet Take 50 mg by mouth every morning.   . tamoxifen (NOLVADEX) 20 MG tablet Take 1 tablet (20 mg total) by mouth daily.  . TRUE METRIX BLOOD GLUCOSE TEST test strip USE TO TEST BLOOD SUGAR 2 TIMES DAILY AS INSTRUCTED.  . Probiotic Product  (PROBIOTIC PO) Take 1 capsule by mouth daily. Reported on 01/05/2016   No facility-administered encounter medications on file as of 11/26/2016.     Functional Status:   In your present state of health, do you have any difficulty performing the following activities: 11/26/2016 08/22/2016  Hearing? N N  Vision? N N  Difficulty concentrating or making decisions? N N  Walking or climbing stairs? N N  Dressing or bathing? N N  Doing errands, shopping? N N  Some recent data might be hidden    Fall/Depression Screening:    PHQ 2/9 Scores 11/26/2016 08/22/2016 05/16/2016 01/26/2016 11/30/2015 06/27/2015  PHQ - 2 Score 0 0 0 0 0 0    Assessment:  Member seen for follow up office visit for Link to Wellness program for self management of Type 2 diabetes. Member is at diabetes self management goal of 7% with last reading of 7%. Member is now taking her  glipizide before supper everyday as directed by MD.  Member  She has follow up with endocrinologist on 01/14/17.  Member is going to the gym 2-3 times  a week and  walking on weekends. Member is up to date on annual eye exam.  Member has enrolled in the [place] program   Plan:  Plan to eat 30-45 GM (2-3) servings of carbohydrate a meal and 15 GM  for snacks.  Plan to eat protein with your snacks.  Try to avoid sweets at bedtime. Plan to check blood sugar 1-2 times a day either fasting or 1 -2hrs after a meal.  Goals of 80-130 fasting and 180 or less after eating. Plan to go to gym 2-3 days a week 30-45 minutes.   Plan to continue to work in garden and yard Plan to see Dr. Cruzita Lederer on 01/14/17 Plan to follow in the [facility] program next year  [facility] CM Care Plan Problem One   Flowsheet Row Most Recent Value  Care Plan Problem One  Elevated blood sugars related to dx of Type 2 DM  Role Documenting the Problem One  Care Management [place] for Problem One  Active  [facility] Long Term Goal (31-90 days)  Member will decrease hemoglobin A1C to 7  or below in next 90 days  [facility] Long Term Goal Start Date  11/26/16 [Continue hemoglobin A1C down to 7.4%]  Interventions for Problem One Long Term Goal   Reviewed CHO counting and portion control, Encouraged to avoid sweets at bedtime and to eat snacks that have protein, Reinforced blood sugar goals and target hemoglobin A1C level, Reinforced to take her glipizide with food in the evening everyday as directed by MD, Reinforced on s/s of hypoglycemia, the rule of 15 and actions to take, Reinforced to check blood sugar a different times as instructed by MD and to take her log,  Given brouchure for the [facility] program and instructed she will be followed through that program next year as she has enrolled in the program  Reinforced importance of regular exercise to glycemic control,     Peter Garter RN, [facility] Care Management Coordinator-Link to [place] Management [phone]

## 2016-11-27 NOTE — Patient Instructions (Signed)
1. Plan to eat 30-45 GM (2-3) servings of carbohydrate a meal and 15 GM for snacks.  Plan to eat protein with your snacks.  Try to avoid sweets at bedtime. 2. Plan to check blood sugar 1-2 times a day either fasting or 1 -2hrs after a meal.  Goals of 80-130 fasting and 180 or less after eating. 3. Plan to go to gym 2-3 days a week 30-45 minutes.   Plan to continue to work in garden and yard 4. Plan to see Dr. Cruzita Lederer on 01/14/17 5. Plan to follow in the [facility] program next year

## 2016-11-29 ENCOUNTER — Other Ambulatory Visit: Payer: Self-pay | Admitting: Hematology and Oncology

## 2016-11-29 DIAGNOSIS — Z853 Personal history of malignant neoplasm of breast: Secondary | ICD-10-CM

## 2016-12-05 ENCOUNTER — Encounter: Payer: Self-pay | Admitting: Internal Medicine

## 2016-12-05 ENCOUNTER — Ambulatory Visit (INDEPENDENT_AMBULATORY_CARE_PROVIDER_SITE_OTHER): Payer: 59 | Admitting: Internal Medicine

## 2016-12-05 VITALS — BP 118/80 | HR 64 | Temp 97.8°F | Wt 208.0 lb

## 2016-12-05 DIAGNOSIS — B9789 Other viral agents as the cause of diseases classified elsewhere: Secondary | ICD-10-CM

## 2016-12-05 DIAGNOSIS — J069 Acute upper respiratory infection, unspecified: Secondary | ICD-10-CM

## 2016-12-05 MED ORDER — HYDROCODONE-HOMATROPINE 5-1.5 MG/5ML PO SYRP: 5.0000 mL | ORAL_SOLUTION | Freq: Three times a day (TID) | ORAL | 0 refills | Status: DC | PRN

## 2016-12-05 MED FILL — HYDROCODONE-HOMATROPINE SYR: 5-1.5 | 8 days supply | Qty: 120 | Fill #0

## 2016-12-05 NOTE — Progress Notes (Signed)
HPI  Pt presents to the clinic today with c/o runny nose, sore throat and cough. This started 1 week ago. She is blowing yellow mucous out of her nose. She denies difficulty swallowing. The cough is productive of green mucous. She denies fever, chills body aches or shortness of breath, but has has some chest congestion. She has tried cough syrup, Tessalon, salt water gargles with some relief. She has no history of allergies or breathing problems. She has not had sick contacts.  Review of Systems        Past Medical History:  Diagnosis Date  . Anxiety   . Breast cancer ([place])   . Contact lens/glasses fitting    wears contacts or glasses  . Depression   . Diabetes ([place]) 11/22/2014  . Hyperlipidemia   . Radiation 03/28/14-05/06/14   Left Breast 60 Gy    Family History  Problem Relation Age of Onset  . Squamous cell carcinoma Father   . Cancer Father   . Diabetes Father   . Hypertension Mother   . Diabetes Sister     Social History   Social History  . Marital status: Married    Spouse name: N/A  . Number of children: N/A  . Years of education: N/A   Occupational History  . Not on file.   Social History Main Topics  . Smoking status: Never Smoker  . Smokeless tobacco: Never Used  . Alcohol use Yes     Comment: 1-2 drinks a month  . Drug use: No  . Sexual activity: Yes   Other Topics Concern  . Not on file   Social History Narrative  . No narrative on file    Allergies  Allergen Reactions  . Erythromycin Other (See Comments)    Stomach pain  . Tape Rash     Constitutional: Denies headache, fatigue, fever or abrupt weight changes.  HEENT:  Positive runny nose, sore throat. Denies eye redness, eye pain, pressure behind the eyes, facial pain, nasal congestion, ear pain, ringing in the ears, wax buildup, or bloody nose. Respiratory: Positive cough. Denies difficulty breathing or shortness of breath.  Cardiovascular: Denies chest pain, chest tightness, palpitations  or swelling in the hands or feet.   No other specific complaints in a complete review of systems (except as listed in HPI above).  Objective:   BP 118/80   Pulse 64   Temp 97.8 F (36.6 C) (Oral)   Wt 208 lb (94.3 kg)   SpO2 98%   BMI 31.63 kg/m  Wt Readings from Last 3 Encounters:  12/05/16 208 lb (94.3 kg)  11/26/16 208 lb 9.6 oz (94.6 kg)  10/11/16 204 lb (92.5 kg)     General: Appears her stated age, well developed, well nourished in NAD. HEENT: Head: normal shape and size; Eyes: sclera white, no icterus, conjunctiva pink; Ears: Tm's gray and intact, normal light reflex; Nose: mucosa pink and moist, septum midline; Throat/Mouth: + PND. Teeth present, mucosa erythematous and moist, no exudate noted, no lesions or ulcerations noted.  Neck: No cervical lymphadenopathy.  Cardiovascular: Normal rate and rhythm. S1,S2 noted.  No murmur, rubs or gallops noted.  Pulmonary/Chest: Normal effort and positive vesicular breath sounds. No respiratory distress. No wheezes, rales or ronchi noted.       Assessment & Plan:    Viral Upper Respiratory Infection with Cough:  Get some rest and drink plenty of water Do salt water gargles for the sore throat Get Mucinex OTC eRx for Hycodan cough  syrup  RTC as needed or if symptoms persist.   Webb Silversmith, NP

## 2016-12-05 NOTE — Patient Instructions (Signed)
Cough, Adult Introduction A cough helps to clear your throat and lungs. A cough may last only 2-3 weeks (acute), or it may last longer than 8 weeks (chronic). Many different things can cause a cough. A cough may be a sign of an illness or another medical condition. Follow these instructions at home:  Pay attention to any changes in your cough.  Take medicines only as told by your doctor.  If you were prescribed an antibiotic medicine, take it as told by your doctor. Do not stop taking it even if you start to feel better.  Talk with your doctor before you try using a cough medicine.  Drink enough fluid to keep your pee (urine) clear or pale yellow.  If the air is dry, use a cold steam vaporizer or humidifier in your home.  Stay away from things that make you cough at work or at home.  If your cough is worse at night, try using extra pillows to raise your head up higher while you sleep.  Do not smoke, and try not to be around smoke. If you need help quitting, ask your doctor.  Do not have caffeine.  Do not drink alcohol.  Rest as needed. Contact a doctor if:  You have new problems (symptoms).  You cough up yellow fluid (pus).  Your cough does not get better after 2-3 weeks, or your cough gets worse.  Medicine does not help your cough and you are not sleeping well.  You have pain that gets worse or pain that is not helped with medicine.  You have a fever.  You are losing weight and you do not know why.  You have night sweats. Get help right away if:  You cough up blood.  You have trouble breathing.  Your heartbeat is very fast. This information is not intended to replace advice given to you by your health care provider. Make sure you discuss any questions you have with your health care provider. Document Released: 08/29/2011 Document Revised: 05/23/2016 Document Reviewed: 02/22/2015  2017 Elsevier  

## 2016-12-13 ENCOUNTER — Other Ambulatory Visit: Payer: Self-pay | Admitting: Internal Medicine

## 2016-12-13 ENCOUNTER — Telehealth: Payer: Self-pay | Admitting: Internal Medicine

## 2016-12-13 MED FILL — METFORMIN HCL ER 750 MG TAB: 750 | 90 days supply | Qty: 180 | Fill #0

## 2016-12-13 NOTE — Telephone Encounter (Signed)
Refill submitted. 

## 2016-12-13 NOTE — Telephone Encounter (Signed)
Patient need refill of metFORMIN (GLUCOPHAGE-XR) 750 MG 24 hr tablet   [place], [place] - [address] [phone] (Phone) [phone] (Fax)

## 2016-12-16 ENCOUNTER — Encounter: Payer: Self-pay | Admitting: Family Medicine

## 2016-12-16 ENCOUNTER — Encounter: Payer: Self-pay | Admitting: Internal Medicine

## 2016-12-16 MED ORDER — AMOXICILLIN 875 MG PO TABS: 875.0000 mg | ORAL_TABLET | Freq: Two times a day (BID) | ORAL | 0 refills | Status: DC

## 2016-12-16 MED FILL — AMOXICILLIN 875 MG TABLET: 875 | 10 days supply | Qty: 20 | Fill #0

## 2016-12-16 NOTE — Telephone Encounter (Signed)
Pt left v/m requesting abx; pt was seen 12/05/16 and is no better. [facility] outpt pharmacy. Pt contact # [phone]. Pt has already sent email.

## 2016-12-19 MED ORDER — HYDROCODONE-HOMATROPINE 5-1.5 MG/5ML PO SYRP: 5.0000 mL | ORAL_SOLUTION | Freq: Three times a day (TID) | ORAL | 0 refills | Status: DC | PRN

## 2016-12-19 NOTE — Addendum Note (Signed)
Addended by: Modena Nunnery on: 12/19/2016 10:15 AM   Modules accepted: Orders

## 2016-12-19 NOTE — Telephone Encounter (Signed)
Lm on pts vm and informed her Rx is available for pickup from the front desk 

## 2016-12-25 MED FILL — HYDROCODONE-HOMATROPINE SYR: 5-1.5 | 8 days supply | Qty: 120 | Fill #0

## 2016-12-25 MED FILL — glipiZIDE 5 MG TABS: 5 | 30 days supply | Qty: 30 | Fill #3

## 2017-01-03 ENCOUNTER — Other Ambulatory Visit: Payer: Self-pay | Admitting: Nurse Practitioner

## 2017-01-07 ENCOUNTER — Ambulatory Visit
Admission: RE | Admit: 2017-01-07 | Discharge: 2017-01-07 | Disposition: A | Discharge status: Home / Self Care | Payer: 59 | Source: Ambulatory Visit | Attending: Hematology and Oncology | Admitting: Hematology and Oncology

## 2017-01-07 DIAGNOSIS — Z853 Personal history of malignant neoplasm of breast: Secondary | ICD-10-CM

## 2017-01-07 DIAGNOSIS — R928 Other abnormal and inconclusive findings on diagnostic imaging of breast: Secondary | ICD-10-CM | POA: Diagnosis not present

## 2017-01-14 ENCOUNTER — Telehealth: Payer: Self-pay | Admitting: Internal Medicine

## 2017-01-14 ENCOUNTER — Encounter: Payer: Self-pay | Admitting: Internal Medicine

## 2017-01-14 ENCOUNTER — Ambulatory Visit (INDEPENDENT_AMBULATORY_CARE_PROVIDER_SITE_OTHER): Payer: 59 | Admitting: Internal Medicine

## 2017-01-14 VITALS — BP 142/90 | HR 78 | Ht 67.0 in | Wt 203.0 lb

## 2017-01-14 DIAGNOSIS — R350 Frequency of micturition: Secondary | ICD-10-CM | POA: Diagnosis not present

## 2017-01-14 DIAGNOSIS — E1165 Type 2 diabetes mellitus with hyperglycemia: Secondary | ICD-10-CM

## 2017-01-14 LAB — POCT GLYCOSYLATED HEMOGLOBIN (HGB A1C): HEMOGLOBIN A1C: 7.1

## 2017-01-14 NOTE — Telephone Encounter (Signed)
Her appointment is actually at 4:15. It is 4:13 now.Marland KitchenMarland Kitchen

## 2017-01-14 NOTE — Progress Notes (Signed)
Patient ID: Veronica Rogers, female   DOB: [birth year], 56 y.o.   MRN: [id number]   HPI: Veronica Rogers is a 56 y.o.-year-old female, returning for f/u for DM2, dx in 2015 (after steroids), non-insulin-dependent, uncontrolled, without complications. Last visit 3 mo ago.  She joined [employer]. She also started [employer] 01/07/2016.  She was sick for 3 weeks this winter.  Last hemoglobin A1c was: Lab Results  Component Value Date   HGBA1C 7.0 10/11/2016   HGBA1C 7.6 (H) 07/24/2016   HGBA1C 7.3 07/11/2016   Pt is on a regimen of: - Metformin ER 1500 mg with dinner - Glipizide 5 mg before dinner - added 03/2006  She was on insulin during ChTx. She was on regular metformin >> GI upset (diarrhea).  Pt checks her sugars  - am: 131, 142-179 >> 139-179 >> 126, 163-193 >> 132-169, 192 (candy) >> 146-161, 180 - 2h after b'fast: 227 (cereal) >> 120-135 >> 75 (after glimepiride), 132-168 >> n/c - before lunch: n/c >> 174 >> n/c >> 109 - 2h after lunch: n/c >> 92, 132-148 >> 176 >> n/c - before dinner: n/c >> 124 >> 91-128, 160 >> n/c - 2h after dinner: n/c >> 182, 254 >> 156, 172, 231 >> n/c - bedtime: n/c - nighttime: n/c >> 82 >> n/c No lows. Lowest sugar was 131 >> 92 >> 126 >> 109; she has hypoglycemia awareness at 70.  Highest sugar was 274 >> 179 >> 254 >> 231 >> 180  Glucometer: True test >> AccuChek  Pt's meals are: - Breakfast: cereals or 2 eggs or skips - 10:30 am: PB crackers - Lunch: leftovers from home: sandwich; soup; meat + veggies; chinese; salad - Dinner: same as lunch [employer] - 2x a week.  - no CKD, last BUN/creatinine:  Lab Results  Component Value Date   BUN 20 07/24/2016   CREATININE 0.67 07/24/2016   - last set of lipids: Lab Results  Component Value Date   CHOL 202 (H) 07/24/2016   HDL 44.30 07/24/2016   LDLCALC 63 02/27/2015   LDLDIRECT 112.0 07/24/2016   TRIG 297.0 (H) 07/24/2016   CHOLHDL 5 07/24/2016   - last eye exam was  in 03/26/2016. No DR. Dr Hazle Quant. - no numbness and tingling in her feet. Had plantar fasciitis. Has some numbness in L heel.  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 On Tamoxifen for 5 years >> up to 2020. She has hot flushes.  ROS: Constitutional: no weight gain/loss, no fatigue, no subjective hyperthermia/hypothermia, + nocturia  Eyes: no blurry vision, no xerophthalmia ENT: no sore throat, no nodules palpated in throat, no dysphagia/odynophagia, no hoarseness Cardiovascular: no CP/SOB/palpitations/leg swelling Respiratory: no cough/SOB Gastrointestinal: no N/V/D/C Musculoskeletal: no muscle/joint aches Skin: no rashes Neurological: no tremors/numbness/tingling/dizziness  I reviewed pt's medications, allergies, PMH, social hx, family hx, and changes were documented in the history of present illness. Otherwise, unchanged from my initial visit note.  Past Medical History:  Diagnosis Date  . Anxiety   . Breast cancer ([facility])   . Contact lens/glasses fitting    wears contacts or glasses  . Depression   . Diabetes ([facility]) 11/22/2014  . Hyperlipidemia   . Radiation 03/28/14-05/06/14   Left Breast 60 Gy   Past Surgical History:  Procedure Laterality Date  . BREAST BIOPSY  1996   right breast, benign  . BREAST LUMPECTOMY WITH NEEDLE LOCALIZATION AND AXILLARY SENTINEL LYMPH NODE BX Left 10/29/2013   Procedure: BREAST LUMPECTOMY WITH NEEDLE LOCALIZATION AND AXILLARY SENTINEL LYMPH  NODE BX;  Surgeon: Rolm Bookbinder, MD;  Location: [facility];  Service: General;  Laterality: Left;  . COLONOSCOPY  05/2011   WNL  . WISDOM TOOTH EXTRACTION     Social History   Social History  . Marital Status: Married    Spouse Name: N/A  . Number of Children: 2   Occupational History  . [employer] - support rep   Social History Main Topics  . Smoking status: Never Smoker   . Smokeless tobacco: Never Used  . Alcohol Use: Yes     Comment: 1-2 drinks a month  . Drug Use: No   Current  Outpatient Prescriptions on File Prior to Visit  Medication Sig Dispense Refill  . BIOTIN PO Take 1 tablet by mouth every morning.    . Diclofenac Sodium 1.5 % SOLN Apply QID to knee 1 Bottle prn  . glipiZIDE (GLUCOTROL) 5 MG tablet TAKE 1 TABLET BY MOUTH DAILY BEFORE SUPPER. 30 tablet 3  . HYDROcodone-homatropine (HYCODAN) 5-1.5 MG/5ML syrup Take 5 mLs by mouth every 8 (eight) hours as needed for cough. 120 mL 0  . meloxicam (MOBIC) 15 MG tablet TAKE 1 TABLET BY MOUTH DAILY. 30 tablet 3  . metFORMIN (GLUCOPHAGE-XR) 750 MG 24 hr tablet TAKE 2 TABLETS BY MOUTH DAILY AT BEDTIME 60 tablet 11  . Multiple Vitamin (MULTIVITAMIN) tablet Take 1 tablet by mouth daily.    . sertraline (ZOLOFT) 50 MG tablet Take 50 mg by mouth every morning.     . tamoxifen (NOLVADEX) 20 MG tablet Take 1 tablet (20 mg total) by mouth daily. 90 tablet 3  . Probiotic Product (PROBIOTIC PO) Take 1 capsule by mouth daily. Reported on 01/05/2016     No current facility-administered medications on file prior to visit.    Allergies  Allergen Reactions  . Erythromycin Other (See Comments)    Stomach pain  . Tape Rash   Family History  Problem Relation Age of Onset  . Squamous cell carcinoma Father   . Cancer Father   . Diabetes Father   . Hypertension Mother   . Diabetes Sister    PE: BP (!) 142/90 (BP Location: Left Arm, Patient Position: Sitting)   Pulse 78   Ht '5\' 7"'$  (1.702 m)   Wt 203 lb (92.1 kg)   SpO2 96%   BMI 31.79 kg/m  Body mass index is 31.79 kg/m. Wt Readings from Last 3 Encounters:  01/14/17 203 lb (92.1 kg)  12/05/16 208 lb (94.3 kg)  11/26/16 208 lb 9.6 oz (94.6 kg)   Constitutional: overweight, in NAD Eyes: PERRLA, EOMI, no exophthalmos ENT: moist mucous membranes, no thyromegaly, no cervical lymphadenopathy Cardiovascular: RRR, No MRG Respiratory: CTA B Gastrointestinal: abdomen soft, NT, ND, BS+ Musculoskeletal: no deformities, strength intact in all 4 Skin: moist, warm, no  rashes Neurological: no tremor with outstretched hands, DTR normal in all 4  ASSESSMENT: 1. DM2, non-insulin-dependent, uncontrolled, without complications  2. Urinary frequency  PLAN:  1. Patient with long-standing, uncontrolled diabetes, on Metformin + Glipizide. Sugars are still high in am and she does not check later >> strongly advised her to start checking. Will move Metformin from bedtime to dinnertime and advised her to take Glipizide before rather than with dinner.  - We may need to add a DPP4 inh in the future, but not for now as she started Weight Watchers >> she already lost 5 lbs! - I suggested to:  Patient Instructions  Please continue: -  Metformin ER 1500 mg but move to dinnertime - Glipizide 5 mg before dinner.   Check sugars 1-2x a day, rotating time checks. Check some sugars before and after dinner or bedtime.  Please return in 1.5 months with your sugar log.  - continue sugars at different times of the day - check 1x a day, rotating checks  - advised for yearly eye exams >> she is UTD - checked HbA1c now >> 7.1% (stable) - Return to clinic in 1.5 mo with sugar log   2. Urinary frequency - will check a  U/A  Office Visit on 01/14/2017  Component Date Value Ref Range Status  . Color, Urine 01/15/2017 YELLOW  YELLOW Final  . APPearance 01/15/2017 TURBID* CLEAR Final  . Specific Gravity, Urine 01/15/2017 1.027  1.001 - 1.035 Final  . pH 01/15/2017 5.0  5.0 - 8.0 Final  . Glucose, UA 01/15/2017 NEGATIVE  NEGATIVE Final  . Bilirubin Urine 01/15/2017 NEGATIVE  NEGATIVE Final  . Ketones, ur 01/15/2017 TRACE* NEGATIVE Final  . Hgb urine dipstick 01/15/2017 NEGATIVE  NEGATIVE Final  . Protein, ur 01/15/2017 NEGATIVE  NEGATIVE Final  . Nitrite 01/15/2017 NEGATIVE  NEGATIVE Final  . Leukocytes, UA 01/15/2017 NEGATIVE  NEGATIVE Final  . WBC, UA 01/15/2017 0-5  <=5 WBC/HPF Final  . RBC / HPF 01/15/2017 NONE SEEN  <=2 RBC/HPF Final  . Squamous Epithelial / LPF  01/15/2017 0-5  <=5 HPF Final  . Bacteria, UA 01/15/2017 NONE SEEN  NONE SEEN HPF Final  . Crystals 01/15/2017 See Below* NONE SEEN HPF Final   Comment: Uric Acid Crystals                   FEW       ABN   NONE SEEN     HPF Amorphous Sediment                   MANY      ABN   NONE SEEN     HPF   . Casts 01/15/2017 NONE SEEN  NONE SEEN LPF Final  . Yeast 01/15/2017 NONE SEEN  NONE SEEN HPF Final  . Hemoglobin A1C 01/14/2017 7.1   Final    No UTI.  Philemon Kingdom, MD PhD [facility] Endocrinology

## 2017-01-14 NOTE — Patient Instructions (Addendum)
Please continue: - Metformin ER 1500 mg but move to dinnertime - Glipizide 5 mg before dinner.   Check sugars 1-2x a day, rotating time checks. Check some sugars before and after dinner or bedtime.  Please return in 1.5 months with your sugar log.

## 2017-01-14 NOTE — Telephone Encounter (Signed)
Disregard no show

## 2017-01-14 NOTE — Telephone Encounter (Signed)
Patient no showed today's appt. Please advise on how to follow up. °A. No follow up necessary. °B. Follow up urgent. Contact patient immediately. °C. Follow up necessary. Contact patient and schedule visit in ___ days. °D. Follow up advised. Contact patient and schedule visit in ____weeks. ° °

## 2017-01-15 LAB — URINALYSIS W MICROSCOPIC + REFLEX CULTURE
BILIRUBIN URINE: NEGATIVE
Bacteria, UA: NONE SEEN [HPF]
Casts: NONE SEEN [LPF]
GLUCOSE, UA: NEGATIVE
Hgb urine dipstick: NEGATIVE
LEUKOCYTES UA: NEGATIVE
Nitrite: NEGATIVE
Protein, ur: NEGATIVE
RBC / HPF: NONE SEEN RBC/HPF (ref <=2)
SPECIFIC GRAVITY, URINE: 1.027 (ref 1.001–1.035)
Yeast: NONE SEEN [HPF]
pH: 5 (ref 5.0–8.0)

## 2017-02-03 ENCOUNTER — Other Ambulatory Visit: Payer: Self-pay | Admitting: *Deleted

## 2017-02-03 DIAGNOSIS — R42 Dizziness and giddiness: Secondary | ICD-10-CM

## 2017-02-06 ENCOUNTER — Telehealth: Payer: Self-pay | Admitting: Internal Medicine

## 2017-02-06 ENCOUNTER — Other Ambulatory Visit: Payer: Self-pay

## 2017-02-06 MED ORDER — GLUCOSE BLOOD VI STRP: ORAL_STRIP | 5 refills | Status: DC

## 2017-02-06 NOTE — Telephone Encounter (Signed)
rx submitted.  

## 2017-02-06 NOTE — Telephone Encounter (Signed)
Pt needs her new brand of test strips (she said she had previously discussed with Dr. Cruzita Lederer) sent to the [facility].  She said they were Accu Check.  She is testing 3x daily.

## 2017-02-14 ENCOUNTER — Other Ambulatory Visit: Payer: Self-pay

## 2017-02-14 MED ORDER — ACCU-CHEK FASTCLIX LANCETS MISC: 5 refills | Status: DC

## 2017-02-14 MED ORDER — GLUCOSE BLOOD VI STRP: ORAL_STRIP | 5 refills | Status: DC

## 2017-02-26 ENCOUNTER — Other Ambulatory Visit: Payer: Self-pay | Admitting: Internal Medicine

## 2017-02-26 DIAGNOSIS — E1165 Type 2 diabetes mellitus with hyperglycemia: Secondary | ICD-10-CM

## 2017-02-26 MED FILL — ACCU-CHEK GUIDE TEST STRIP: 90 days supply | Qty: 300 | Fill #0

## 2017-02-26 MED FILL — SERTRALINE HCL 50 MG TABLET: 50 | 90 days supply | Qty: 90 | Fill #3

## 2017-02-26 MED FILL — MELOXICAM 15 MG TABLET: 15 | 30 days supply | Qty: 30 | Fill #2

## 2017-02-26 MED FILL — TAMOXIFEN 20 MG TABLET: 20 | 90 days supply | Qty: 90 | Fill #3

## 2017-02-26 MED FILL — glipiZIDE 5 MG TABS: 5 | 30 days supply | Qty: 30 | Fill #0

## 2017-03-12 MED FILL — METFORMIN HCL ER 750 MG TAB: 750 | 90 days supply | Qty: 180 | Fill #1

## 2017-03-13 ENCOUNTER — Encounter: Payer: Self-pay | Admitting: Internal Medicine

## 2017-03-13 ENCOUNTER — Ambulatory Visit (INDEPENDENT_AMBULATORY_CARE_PROVIDER_SITE_OTHER): Payer: 59 | Admitting: Internal Medicine

## 2017-03-13 VITALS — BP 132/82 | HR 80 | Wt 205.0 lb

## 2017-03-13 DIAGNOSIS — E1165 Type 2 diabetes mellitus with hyperglycemia: Secondary | ICD-10-CM

## 2017-03-13 MED ORDER — SITAGLIPTIN PHOSPHATE 100 MG PO TABS: 100.0000 mg | ORAL_TABLET | Freq: Every day | ORAL | 1 refills | Status: DC

## 2017-03-13 MED FILL — JANUVIA 100 MG TABLET: 100 | 45 days supply | Qty: 45 | Fill #0

## 2017-03-13 NOTE — Patient Instructions (Signed)
Please continue:  - Metformin ER 1500 mg with dinner.  Stop Glipizide.  Add: - Januvia 100 mg before dinner.  If sugars in am are still high, try moving Januvia to lunchtime. If sugars at bedtime are high, try to add 2.5 mg Glipizide before dinner.  Please return in 2 months with your sugar log.

## 2017-03-13 NOTE — Progress Notes (Signed)
Patient ID: DESHAWNDA ACREY, female   DOB: 06-03-1961, 56 y.o.   MRN: [id number]   HPI: NUR RABOLD is a 56 y.o.-year-old female, returning for f/u for DM2, dx in 2015 (after steroids), non-insulin-dependent, uncontrolled, without complications. Last visit 2 mo ago.  She is in [employer]   Last hemoglobin A1c was: Lab Results  Component Value Date   HGBA1C 7.1 01/14/2017   HGBA1C 7.0 10/11/2016   HGBA1C 7.6 (H) 07/24/2016   Pt is on a regimen of: - Metformin ER 1500 mg with dinner - Glipizide 5 mg before dinner - added 03/2006  She was on insulin during ChTx. She was on regular metformin >> GI upset (diarrhea).  Pt checks her sugars 0-2x a day >> fluctuating, still higher in am: - am:  132-169, 192 (candy) >> 146-161, 180 >> 132-161 - 2h after b'fast: 75 (after glimepiride), 132-168 >> n/c >> 105, 112 - before lunch: n/c >> 174 >> n/c >> 109 >> 98-123 - 2h after lunch: n/c >> 92, 132-148 >> 176 >> n/c >> 109 - before dinner: n/c >> 124 >> 91-128, 160 >> n/c >> 110 - 2h after dinner:  156, 172, 231 >> n/c >> 91, 147, 197, 212 - bedtime: n/c  - nighttime: n/c >> 82 >> n/c >> 82-124 No lows. Lowest sugar was 131 >> 92 >> 126 >> 109 >> 105; she has hypoglycemia awareness at 70.  Highest sugar was 274 >> 179 >> 254 >> 231 >> 180 >> 210  Glucometer: True test >> AccuChek  Pt's meals are: - Breakfast: cereals or 2 eggs or skips - 10:30 am: PB crackers - Lunch: leftovers from home: sandwich; soup; meat + veggies; chinese; salad - Dinner: same as lunch [employer] - 2x a week.  - no CKD, last BUN/creatinine:  Lab Results  Component Value Date   BUN 20 07/24/2016   CREATININE 0.67 07/24/2016   - last set of lipids: Lab Results  Component Value Date   CHOL 202 (H) 07/24/2016   HDL 44.30 07/24/2016   LDLCALC 63 02/27/2015   LDLDIRECT 112.0 07/24/2016   TRIG 297.0 (H) 07/24/2016   CHOLHDL 5 07/24/2016   - last eye exam was in 03/26/2016. No DR. Dr Bing Plume. -  no numbness and tingling in her feet. H/o plantar fasciitis. Has some numbness in L heel.  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 On Tamoxifen for 5 years >> up to 2020.   ROS: Constitutional: no weight gain/loss, no fatigue, + hot flushes., + nocturia  Eyes: no blurry vision, no xerophthalmia ENT: no sore throat, no nodules palpated in throat, no dysphagia/odynophagia, no hoarseness Cardiovascular: no CP/SOB/palpitations/leg swelling Respiratory: no cough/SOB Gastrointestinal: no N/V/D/C Musculoskeletal: no muscle/joint aches Skin: no rashes Neurological: no tremors/numbness/tingling/dizziness  I reviewed pt's medications, allergies, PMH, social hx, family hx, and changes were documented in the history of present illness. Otherwise, unchanged from my initial visit note.  Past Medical History:  Diagnosis Date  . Anxiety   . Breast cancer ([place])   . Contact lens/glasses fitting    wears contacts or glasses  . Depression   . Diabetes ([place]) 11/22/2014  . Hyperlipidemia   . Radiation 03/28/14-05/06/14   Left Breast 60 Gy   Past Surgical History:  Procedure Laterality Date  . BREAST BIOPSY  1996   right breast, benign  . BREAST LUMPECTOMY WITH NEEDLE LOCALIZATION AND AXILLARY SENTINEL LYMPH NODE BX Left 10/29/2013   Procedure: BREAST LUMPECTOMY WITH NEEDLE LOCALIZATION AND AXILLARY SENTINEL LYMPH NODE  BX;  Surgeon: Rolm Bookbinder, MD;  Location: [facility];  Service: General;  Laterality: Left;  . COLONOSCOPY  05/2011   WNL  . WISDOM TOOTH EXTRACTION     Social History   Social History  . Marital Status: Married    Spouse Name: N/A  . 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 Smoking status: Never Smoker   . Smokeless tobacco: Never Used  . Alcohol Use: Yes     Comment: 1-2 drinks a month  . Drug Use: No   Current Outpatient Prescriptions on File Prior to Visit  Medication Sig Dispense  Refill  . ACCU-CHEK FASTCLIX LANCETS MISC Use to check sugar 3 times daily 300 each 5  . BIOTIN PO Take 1 tablet by mouth every morning.    . Diclofenac Sodium 1.5 % SOLN Apply QID to knee 1 Bottle prn  . glipiZIDE (GLUCOTROL) 5 MG tablet TAKE 1 TABLET BY MOUTH DAILY BEFORE SUPPER. 30 tablet 3  . glucose blood (ACCU-CHEK GUIDE) test strip Use as instructed to check sugar 3 times daily 300 each 5  . meloxicam (MOBIC) 15 MG tablet TAKE 1 TABLET BY MOUTH DAILY. 30 tablet 3  . metFORMIN (GLUCOPHAGE-XR) 750 MG 24 hr tablet TAKE 2 TABLETS BY MOUTH DAILY AT BEDTIME 60 tablet 11  . Multiple Vitamin (MULTIVITAMIN) tablet Take 1 tablet by mouth daily.    . sertraline (ZOLOFT) 50 MG tablet Take 50 mg by mouth every morning.     . tamoxifen (NOLVADEX) 20 MG tablet Take 1 tablet (20 mg total) by mouth daily. 90 tablet 3   No current facility-administered medications on file prior to visit.    Allergies  Allergen Reactions  . Erythromycin Other (See Comments)    Stomach pain  . Tape Rash   Family History  Problem Relation Age of Onset  . Squamous cell carcinoma Father   . Cancer Father   . Diabetes Father   . Hypertension Mother   . Diabetes Sister    PE: BP 132/82 (BP Location: Left Arm, Patient Position: Sitting)   Pulse 80   Wt 205 lb (93 kg)   SpO2 97%   BMI 32.11 kg/m  Body mass index is 32.11 kg/m. Wt Readings from Last 3 Encounters:  03/13/17 205 lb (93 kg)  01/14/17 203 lb (92.1 kg)  12/05/16 208 lb (94.3 kg)   Constitutional: overweight, in NAD Eyes: PERRLA, EOMI, no exophthalmos ENT: moist mucous membranes, no thyromegaly, no cervical lymphadenopathy Cardiovascular: RRR, No MRG Respiratory: CTA B Gastrointestinal: abdomen soft, NT, ND, BS+ Musculoskeletal: no deformities, strength intact in all 4 Skin: moist, warm, no rashes Neurological: no tremor with outstretched hands, DTR normal in all 4  ASSESSMENT: 1. DM2, non-insulin-dependent, uncontrolled, without  complications  2. Urinary frequency  PLAN:  1. Patient with long-standing, uncontrolled diabetes, on Metformin + Glipizide. Her sugars are higher in the morning despite moving Metformin to dinnertime and taking her Glipizide before dinner. Since she may have sugars in the 80s in the middle of the night, we will attempt to start Januvia before dinner for hopefully more prolonged effect with subsequent improvement in her a.m. sugars. I advised her that if she does not see any improvement in her sugars, she may try to move Januvia at lunchtime. Also, if her sugars after dinner start increasing, she may have half of the glipizide tablet before  dinner. - I suggested to:  Patient Instructions  Please continue:  - Metformin ER 1500 mg with dinner.  Stop Glipizide.  Add: - Januvia 100 mg before dinner.  If sugars in am are still high, try moving Januvia to lunchtime. If sugars at bedtime are high, try to add 2.5 mg Glipizide before dinner.  Please return in 2 months with your sugar log.   - continue sugars at different times of the day - check 1x a day, rotating checks  - advised for yearly eye exams >> she is UTD - checked HbA1c now >> 7.1% (stable) - Return to clinic in 2 mo with sugar log   Philemon Kingdom, MD PhD [facility] Endocrinology

## 2017-04-07 DIAGNOSIS — E119 Type 2 diabetes mellitus without complications: Secondary | ICD-10-CM | POA: Diagnosis not present

## 2017-04-07 DIAGNOSIS — H43811 Vitreous degeneration, right eye: Secondary | ICD-10-CM | POA: Diagnosis not present

## 2017-04-07 DIAGNOSIS — H2513 Age-related nuclear cataract, bilateral: Secondary | ICD-10-CM | POA: Diagnosis not present

## 2017-04-07 LAB — HM DIABETES EYE EXAM

## 2017-04-24 DIAGNOSIS — D225 Melanocytic nevi of trunk: Secondary | ICD-10-CM | POA: Diagnosis not present

## 2017-04-24 DIAGNOSIS — D1801 Hemangioma of skin and subcutaneous tissue: Secondary | ICD-10-CM | POA: Diagnosis not present

## 2017-04-24 MED FILL — MELOXICAM 15 MG TABLET: 15 | 30 days supply | Qty: 30 | Fill #3

## 2017-04-24 MED FILL — JANUVIA 100 MG TABLET: 100 | 45 days supply | Qty: 45 | Fill #1

## 2017-04-24 MED FILL — glipiZIDE 5 MG TABS: 5 | 90 days supply | Qty: 90 | Fill #1

## 2017-05-19 ENCOUNTER — Ambulatory Visit (INDEPENDENT_AMBULATORY_CARE_PROVIDER_SITE_OTHER): Payer: 59 | Admitting: Internal Medicine

## 2017-05-19 ENCOUNTER — Encounter: Payer: Self-pay | Admitting: Internal Medicine

## 2017-05-19 VITALS — BP 130/80 | HR 77 | Ht 67.5 in | Wt 201.0 lb

## 2017-05-19 DIAGNOSIS — E1165 Type 2 diabetes mellitus with hyperglycemia: Secondary | ICD-10-CM | POA: Diagnosis not present

## 2017-05-19 LAB — POCT GLYCOSYLATED HEMOGLOBIN (HGB A1C): HEMOGLOBIN A1C: 7.1

## 2017-05-19 NOTE — Patient Instructions (Signed)
Please continue:  - Metformin ER 1500 mg with dinner. - Januvia 100 mg before dinner.  Please return in 3 months with your sugar log.

## 2017-05-19 NOTE — Progress Notes (Signed)
Patient ID: Veronica Rogers, female   DOB: 1961/07/15, 56 y.o.   MRN: [id number]   HPI: Veronica Rogers is a 56 y.o.-year-old female, returning for f/u for DM2, dx in 2015 (after steroids), non-insulin-dependent, uncontrolled, without long term complications. Last visit 4 mo ago.  Last hemoglobin A1c was: Lab Results  Component Value Date   HGBA1C 7.1 01/14/2017   HGBA1C 7.0 10/11/2016   HGBA1C 7.6 (H) 07/24/2016   Pt is on a regimen of: - Metformin ER 1500 mg with dinner - Januvia 100 mg before dinner - started 12/2016 We stopped Glipizide 5 mg before dinner in 12/2016 She was on insulin during ChTx. She was on regular metformin >> GI upset (diarrhea).  Pt checks her sugars 0-2x a day:  - am:  132-169, 192 (candy) >> 146-161, 180 >> 132-161 >> 95, 135-152, 162 - 2h after b'fast: 75 (after glimepiride), 132-168 >> n/c >> 105, 112 >> n/c - before lunch: n/c >> 174 >> n/c >> 109 >> 98-123 >> 90-138 - 2h after lunch: n/c >> 92, 132-148 >> 176 >> n/c >> 109 >> n/c - before dinner: n/c >> 124 >> 91-128, 160 >> n/c >> 110 >> 106 - 2h after dinner:  156, 172, 231 >> n/c >> 91, 147, 197, 212 >> 113, 129 - bedtime: n/c  - nighttime: n/c >> 82 >> n/c >> 82-124 >> n/c No lows. Lowest sugar was 105 >> 95; she has hypoglycemia awareness at 70.  Highest sugar was  210 >> 162  Glucometer: True test >> AccuChek  Pt's meals are: - Breakfast: cereals or 2 eggs or skips - 10:30 am: PB crackers - Lunch: leftovers from home: sandwich; soup; meat + veggies; chinese; salad - Dinner: same as lunch [employer] - 2x a week - off and on. Bicycle, walks.  - No CKD, last BUN/creatinine:  Lab Results  Component Value Date   BUN 20 07/24/2016   CREATININE 0.67 07/24/2016   - last set of lipids: Lab Results  Component Value Date   CHOL 202 (H) 07/24/2016   HDL 44.30 07/24/2016   LDLCALC 63 02/27/2015   LDLDIRECT 112.0 07/24/2016   TRIG 297.0 (H) 07/24/2016   CHOLHDL 5 07/24/2016   - last  eye exam was in 02/2017. No DR. Dr. Katy Fitch. - no numbness and tingling in her feet. She has a history of plantar fasciitis.  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 On Tamoxifen for 5 years >> she will finish the course in 2020.  ROS: Constitutional: no weight gain/no weight loss, no fatigue, no subjective hyperthermia, no subjective hypothermia Eyes: no blurry vision, no xerophthalmia ENT: no sore throat, no nodules palpated in throat, no dysphagia, no odynophagia, no hoarseness Cardiovascular: no CP/no SOB/no palpitations/no leg swelling Respiratory: no cough/no SOB/no wheezing Gastrointestinal: no N/no V/no D/no C/no acid reflux Musculoskeletal: no muscle aches/no joint aches Skin: no rashes, no hair loss Neurological: no tremors/no numbness/no tingling/no dizziness  I reviewed pt's medications, allergies, PMH, social hx, family hx, and changes were documented in the history of present illness. Otherwise, unchanged from my initial visit note.   Past Medical History:  Diagnosis Date  . Anxiety   . Breast cancer ([place])   . Contact lens/glasses fitting    wears contacts or glasses  . Depression   . Diabetes ([place]) 11/22/2014  . Hyperlipidemia   . Radiation 03/28/14-05/06/14   Left Breast 60 Gy   Past Surgical History:  Procedure Laterality Date  . [place]  right breast, benign  . BREAST LUMPECTOMY WITH NEEDLE LOCALIZATION AND AXILLARY SENTINEL LYMPH NODE BX Left 10/29/2013   Procedure: BREAST LUMPECTOMY WITH NEEDLE LOCALIZATION AND AXILLARY SENTINEL LYMPH NODE BX;  Surgeon: Rolm Bookbinder, MD;  Location: [facility];  Service: General;  Laterality: Left;  . COLONOSCOPY  05/2011   WNL  . WISDOM TOOTH EXTRACTION     Social History   Social History  . Marital Status: Married    Spouse Name: N/A  . 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 Smoking status: Never Smoker   . Smokeless  tobacco: Never Used  . Alcohol Use: Yes     Comment: 1-2 drinks a month  . Drug Use: No   Current Outpatient Prescriptions on File Prior to Visit  Medication Sig Dispense Refill  . ACCU-CHEK FASTCLIX LANCETS MISC Use to check sugar 3 times daily 300 each 5  . BIOTIN PO Take 1 tablet by mouth every morning.    . Diclofenac Sodium 1.5 % SOLN Apply QID to knee 1 Bottle prn  . glipiZIDE (GLUCOTROL) 5 MG tablet TAKE 1 TABLET BY MOUTH DAILY BEFORE SUPPER. 30 tablet 3  . glucose blood (ACCU-CHEK GUIDE) test strip Use as instructed to check sugar 3 times daily 300 each 5  . meloxicam (MOBIC) 15 MG tablet TAKE 1 TABLET BY MOUTH DAILY. 30 tablet 3  . metFORMIN (GLUCOPHAGE-XR) 750 MG 24 hr tablet TAKE 2 TABLETS BY MOUTH DAILY AT BEDTIME 60 tablet 11  . Multiple Vitamin (MULTIVITAMIN) tablet Take 1 tablet by mouth daily.    . sertraline (ZOLOFT) 50 MG tablet Take 50 mg by mouth every morning.     . sitaGLIPtin (JANUVIA) 100 MG tablet Take 1 tablet (100 mg total) by mouth daily. 45 tablet 1  . tamoxifen (NOLVADEX) 20 MG tablet Take 1 tablet (20 mg total) by mouth daily. 90 tablet 3   No current facility-administered medications on file prior to visit.    Allergies  Allergen Reactions  . Erythromycin Other (See Comments)    Stomach pain  . Tape Rash   Family History  Problem Relation Age of Onset  . Squamous cell carcinoma Father   . Cancer Father   . Diabetes Father   . Hypertension Mother   . Diabetes Sister    PE: BP 130/80 (BP Location: Left Arm, Patient Position: Sitting)   Pulse 77   Ht 5' 7.5" (1.715 m)   Wt 201 lb (91.2 kg)   LMP 11/08/2013   SpO2 97%   BMI 31.02 kg/m  Body mass index is 31.02 kg/m. Wt Readings from Last 3 Encounters:  05/19/17 201 lb (91.2 kg)  03/13/17 205 lb (93 kg)  01/14/17 203 lb (92.1 kg)   Constitutional: overweight, in NAD Eyes: PERRLA, EOMI, no exophthalmos ENT: moist mucous membranes, no thyromegaly, no cervical  lymphadenopathy Cardiovascular: RRR, No MRG Respiratory: CTA B Gastrointestinal: abdomen soft, NT, ND, BS+ Musculoskeletal: no deformities, strength intact in all 4 Skin: moist, warm, no rashes Neurological: no tremor with outstretched hands, DTR normal in all 4  ASSESSMENT: 1. DM2, non-insulin-dependent, uncontrolled, without long term complications, but with HGly  PLAN:  1. Patient with long-standing, uncontrolled diabetes, on Metformin + now Januvia, which we started in place of glipizide at last visit. She is taking Januvia before dinner to hopefully help with bedtime blood sugars and subsequently with a.m. sugars.  She continues to have some high blood sugars in the morning, but they have improved slightly. -  she is more active recently and plans to start eating more vegetables  which she plans in her garden - I would not change her regimen for now - I suggested to:  Patient Instructions  Please continue:  - Metformin ER 1500 mg with dinner. - Januvia 100 mg before dinner.  Please return in 3 months with your sugar log.   - today, HbA1c is 7.1% (stable) - continue checking sugars at different times of the day - check 1x a day, rotating checks - advised for yearly eye exams >> she is UTD - Return to clinic in 3 mo with sugar log    Philemon Kingdom, MD PhD [facility] Endocrinology

## 2017-06-03 ENCOUNTER — Other Ambulatory Visit: Payer: Self-pay | Admitting: Family Medicine

## 2017-06-03 ENCOUNTER — Other Ambulatory Visit: Payer: Self-pay | Admitting: Internal Medicine

## 2017-06-03 MED FILL — METFORMIN HCL ER 750 MG TAB: 750 | 90 days supply | Qty: 180 | Fill #2

## 2017-06-03 MED FILL — JANUVIA 100 MG TABLET: 100 | 45 days supply | Qty: 45 | Fill #0

## 2017-06-03 MED FILL — SERTRALINE HCL 50 MG TABLET: 50 | 90 days supply | Qty: 90 | Fill #0

## 2017-06-03 MED FILL — MELOXICAM 15 MG TABLET: 15 | 30 days supply | Qty: 30 | Fill #0

## 2017-06-03 NOTE — Telephone Encounter (Signed)
Last refill 11/05/16 #30+3, last OV 07/25/16.

## 2017-06-04 ENCOUNTER — Other Ambulatory Visit: Payer: Self-pay | Admitting: Hematology and Oncology

## 2017-06-04 DIAGNOSIS — C50212 Malignant neoplasm of upper-inner quadrant of left female breast: Secondary | ICD-10-CM

## 2017-06-04 MED FILL — TAMOXIFEN 20 MG TABLET: 20 | 90 days supply | Qty: 90 | Fill #0

## 2017-06-24 ENCOUNTER — Emergency Department (HOSPITAL_COMMUNITY): Payer: 59

## 2017-06-24 ENCOUNTER — Encounter (HOSPITAL_COMMUNITY): Payer: Self-pay | Admitting: Vascular Surgery

## 2017-06-24 ENCOUNTER — Emergency Department (HOSPITAL_COMMUNITY)
Admission: EM | Admit: 2017-06-24 | Discharge: 2017-06-24 | Disposition: A | Discharge status: Home / Self Care | Payer: 59 | Attending: Emergency Medicine | Admitting: Emergency Medicine

## 2017-06-24 DIAGNOSIS — Z7984 Long term (current) use of oral hypoglycemic drugs: Secondary | ICD-10-CM | POA: Diagnosis not present

## 2017-06-24 DIAGNOSIS — M25561 Pain in right knee: Secondary | ICD-10-CM | POA: Insufficient documentation

## 2017-06-24 DIAGNOSIS — E119 Type 2 diabetes mellitus without complications: Secondary | ICD-10-CM | POA: Insufficient documentation

## 2017-06-24 DIAGNOSIS — Z853 Personal history of malignant neoplasm of breast: Secondary | ICD-10-CM | POA: Diagnosis not present

## 2017-06-24 DIAGNOSIS — Y9389 Activity, other specified: Secondary | ICD-10-CM | POA: Insufficient documentation

## 2017-06-24 DIAGNOSIS — Y929 Unspecified place or not applicable: Secondary | ICD-10-CM | POA: Diagnosis not present

## 2017-06-24 DIAGNOSIS — S8991XA Unspecified injury of right lower leg, initial encounter: Secondary | ICD-10-CM | POA: Diagnosis not present

## 2017-06-24 DIAGNOSIS — R52 Pain, unspecified: Secondary | ICD-10-CM | POA: Diagnosis not present

## 2017-06-24 DIAGNOSIS — W010XXA Fall on same level from slipping, tripping and stumbling without subsequent striking against object, initial encounter: Secondary | ICD-10-CM | POA: Insufficient documentation

## 2017-06-24 DIAGNOSIS — Y999 Unspecified external cause status: Secondary | ICD-10-CM | POA: Diagnosis not present

## 2017-06-24 MED ORDER — KETOROLAC TROMETHAMINE 60 MG/2ML IM SOLN
30.0000 mg | Freq: Once | INTRAMUSCULAR | Status: AC
  Administered 2017-06-24: 30 mg via INTRAMUSCULAR
  Filled 2017-06-24: qty 2

## 2017-06-24 MED ORDER — HYDROCODONE-ACETAMINOPHEN 5-325 MG PO TABS
2.0000 | ORAL_TABLET | ORAL | 0 refills | Status: DC | PRN
Start: 2017-06-24 — End: 2017-07-22

## 2017-06-24 MED ORDER — MELOXICAM 7.5 MG PO TABS: 7.5000 mg | ORAL_TABLET | Freq: Every day | ORAL | 0 refills | Status: DC

## 2017-06-24 MED ORDER — HYDROMORPHONE HCL 1 MG/ML IJ SOLN
1.0000 mg | Freq: Once | INTRAMUSCULAR | Status: AC
  Administered 2017-06-24: 1 mg via INTRAMUSCULAR
  Filled 2017-06-24: qty 1

## 2017-06-24 MED FILL — HYDROCODON-APAP 5-325: 5-325 | 2 days supply | Qty: 16 | Fill #0

## 2017-06-24 MED FILL — MELOXICAM 7.5 MG TABLET: 7.5 | 20 days supply | Qty: 20 | Fill #0

## 2017-06-24 NOTE — ED Notes (Signed)
Pt requesting pain meds now.

## 2017-06-24 NOTE — ED Notes (Signed)
Pt reports she "feels better but it still hurts". Okay'd with PA to discharge pt. Taken out by w/c.

## 2017-06-24 NOTE — ED Triage Notes (Signed)
Pt reports to the ED for eval of right knee pain. Pt has hx of chronic rt knee pain. Last night she was carrying some water and slipped and fell with her right leg going backwards. She has a knee brace on. She rates her pain at a 10/10 at this time. PMS intact. No obvious swelling or deformity.

## 2017-06-24 NOTE — ED Notes (Signed)
Pt refused knee immobilizer and crutches.  

## 2017-06-24 NOTE — Progress Notes (Signed)
Orthopedic Tech Progress Note Patient Details:  Veronica Rogers August 19, 1961 [id number]  Patient ID: [name], female   DOB: [birth year], 56 y.o.   MRN: [id number]   [place] Pink 06/24/2017, 12:01 PMPatient refused knee Immobilizer.

## 2017-06-24 NOTE — Discharge Instructions (Signed)
See Dr. Marlou Sa for recheck

## 2017-06-24 NOTE — ED Provider Notes (Signed)
[facility] [facility] DEPT Provider Note   CSN: [id number] Arrival date & time: 06/24/17  0800  By signing my name below, I, Hansel Feinstein, attest that this documentation has been prepared under the direction and in the presence of Ramina Hulet K. Vu Liebman, PA-C. Electronically Signed: Hansel Feinstein, ED Scribe. 06/24/17. 9:39 AM.    History   Chief Complaint Chief Complaint  Patient presents with  . Knee Pain    HPI Veronica Rogers is a 56 y.o. female who presents to the Emergency Department complaining of moderate, constant right knee pain s/p injury that occurred yesterday. Pt states she twisted and jammed the right knee yesterday. H/o right knee problems followed by [facility] sports medicine. Pain is so bad today she cannot walk. She is wearing an old brace with inadequate relief. Pt currently requests MRI. She denies fever.    The history is provided by the patient. No language interpreter was used.    Past Medical History:  Diagnosis Date  . Anxiety   . Breast cancer ([place])   . Contact lens/glasses fitting    wears contacts or glasses  . Depression   . Diabetes ([place]) 11/22/2014  . Hyperlipidemia   . Radiation 03/28/14-05/06/14   Left Breast 60 Gy    Patient Active Problem List   Diagnosis Date Noted  . Type 2 diabetes mellitus with hyperglycemia, without long-term current use of insulin ([place]) 01/08/2016  . Allergic rhinitis 11/22/2014  . 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[place]) 10/14/2013  . Elevated blood pressure (not hypertension) 04/18/2011  . Routine general medical examination at a health care facility 04/18/2011    Past Surgical History:  Procedure Laterality Date  . BREAST BIOPSY  1996   right breast, benign  . BREAST LUMPECTOMY WITH NEEDLE LOCALIZATION AND AXILLARY SENTINEL LYMPH NODE BX Left 10/29/2013   Procedure: BREAST LUMPECTOMY WITH NEEDLE LOCALIZATION AND AXILLARY SENTINEL LYMPH NODE BX;  Surgeon: Rolm Bookbinder, MD;  Location:  [facility];  Service: General;  Laterality: Left;  . COLONOSCOPY  05/2011   WNL  . WISDOM TOOTH EXTRACTION      OB History    No data available       Home Medications    Prior to Admission medications   Medication Sig Start Date End Date Taking? Authorizing Provider  ACCU-CHEK FASTCLIX LANCETS MISC Use to check sugar 3 times daily 02/14/17   Philemon Kingdom, MD  BIOTIN PO Take 1 tablet by mouth every morning.    [provider]  Diclofenac Sodium 1.5 % SOLN Apply QID to knee 11/06/15   Copland, Spencer, MD  glipiZIDE (GLUCOTROL) 5 MG tablet TAKE 1 TABLET BY MOUTH DAILY BEFORE SUPPER. 02/26/17   Philemon Kingdom, MD  glucose blood (ACCU-CHEK GUIDE) test strip Use as instructed to check sugar 3 times daily 02/14/17   Philemon Kingdom, MD  JANUVIA 100 MG tablet TAKE 1 TABLET BY MOUTH DAILY. 06/03/17   Philemon Kingdom, MD  meloxicam (MOBIC) 15 MG tablet TAKE 1 TABLET BY MOUTH DAILY. 06/03/17   Lucille Passy, MD  metFORMIN (GLUCOPHAGE-XR) 750 MG 24 hr tablet TAKE 2 TABLETS BY MOUTH DAILY AT BEDTIME 12/13/16   Philemon Kingdom, MD  Multiple Vitamin (MULTIVITAMIN) tablet Take 1 tablet by mouth daily.    [provider]  sertraline (ZOLOFT) 50 MG tablet Take 50 mg by mouth every morning.     [provider]  tamoxifen (NOLVADEX) 20 MG tablet TAKE 1 TABLET BY MOUTH ONCE DAILY  06/04/17   Nicholas Lose, MD    Family History Family History  Problem Relation Age of Onset  . Squamous cell carcinoma Father   . Cancer Father   . Diabetes Father   . Hypertension Mother   . Diabetes Sister     Social History Social History  Substance Use Topics  . Smoking status: Never Smoker  . Smokeless tobacco: Never Used  . Alcohol use Yes     Comment: 1-2 drinks a month     Allergies   Erythromycin and Tape   Review of Systems Review of Systems  Constitutional: Negative for fever.  Musculoskeletal: Positive for arthralgias.  All other systems reviewed  and are negative.    Physical Exam Updated Vital Signs BP (!) 161/79 (BP Location: Right Arm)   Pulse 84   Temp 98.1 F (36.7 C) (Oral)   Resp 16   LMP 11/08/2013   SpO2 99%   Physical Exam  Constitutional: She appears well-developed and well-nourished.  HENT:  Head: Normocephalic.  Eyes: Conjunctivae are normal.  Cardiovascular: Normal rate.   Pulmonary/Chest: Effort normal. No respiratory distress.  Abdominal: She exhibits no distension.  Musculoskeletal: Normal range of motion.  Neurological: She is alert.  Skin: Skin is warm and dry.  Psychiatric: She has a normal mood and affect. Her behavior is normal.  Nursing note and vitals reviewed.    ED Treatments / Results   DIAGNOSTIC STUDIES: Oxygen Saturation is 99% on RA, normal by my interpretation.    COORDINATION OF CARE: 9:37 AM Discussed treatment plan with pt at bedside which includes XR and pt agreed to plan.   Radiology Dg Knee Complete 4 Views Right  Result Date: 06/24/2017 CLINICAL DATA:  Fall.  Injured right knee. EXAM: RIGHT KNEE - COMPLETE 4+ VIEW COMPARISON:  MRI 12/21/2009 . FINDINGS: Tricompartment degenerative change present. Degenerative changes particularly prominent about the lateral compartment. No acute bony abnormality identified. IMPRESSION: Tricompartment degenerative change particularly prominent about the lateral compartment. No evidence of fracture or dislocation. Electronically Signed   By: Marcello Moores  Register   On: 06/24/2017 10:19    Procedures Procedures (including critical care time)  Medications Ordered in ED Medications - No data to display   Initial Impression / Assessment and Plan / ED Course  I have reviewed the triage vital signs and the nursing notes.  Pertinent imaging results that were available during my care of the patient were reviewed by me and considered in my medical decision making (see chart for details).     Patient X-Ray negative for obvious fracture or  dislocation.  Pt advised to follow up with orthopedics. Patient refused knee immobilizer and crutches while in ED, conservative therapy recommended and discussed, including continued home knee brace and crutch use. Pt given Toradol and Dilaudid in the ED. Patient will be discharged home & is agreeable with above plan. Returns precautions discussed. Pt appears safe for discharge.   Final Clinical Impressions(s) / ED Diagnoses   Final diagnoses:  Acute pain of right knee    New Prescriptions Discharge Medication List as of 06/24/2017 10:53 AM    START taking these medications   Details  HYDROcodone-acetaminophen (NORCO/VICODIN) 5-325 MG tablet Take 2 tablets by mouth every 4 (four) hours as needed., Starting Tue 06/24/2017, Print        An After Visit Summary was printed and given to the patient.  I personally performed the services in this documentation, which was scribed in my presence.  The recorded information has  been reviewed and considered.   Ronnald Collum.   Fransico Meadow, PA-C 06/24/17 [place], MD 06/25/17 1425

## 2017-06-24 NOTE — ED Notes (Signed)
As patient was being put in w/c by tech, states she needed something for pain before she could go. PA informed by tech.

## 2017-06-25 ENCOUNTER — Encounter (INDEPENDENT_AMBULATORY_CARE_PROVIDER_SITE_OTHER): Payer: Self-pay | Admitting: Family

## 2017-06-25 ENCOUNTER — Ambulatory Visit (INDEPENDENT_AMBULATORY_CARE_PROVIDER_SITE_OTHER): Payer: 59 | Admitting: Family

## 2017-06-25 VITALS — Ht 67.0 in | Wt 201.0 lb

## 2017-06-25 DIAGNOSIS — S8991XA Unspecified injury of right lower leg, initial encounter: Secondary | ICD-10-CM | POA: Diagnosis not present

## 2017-06-25 NOTE — Progress Notes (Signed)
Office Visit Note   Patient: Veronica Rogers           Date of Birth: 04/24/1961           MRN: [id number] Visit Date: 06/25/2017              Requested by: Lucille Passy, MD [place], [place] [address] PCP: Lucille Passy, MD  Chief Complaint  Patient presents with  . Right Knee - Follow-up    S/p twist injury. To ER 06/23/17      HPI: The patient is a 1 rolled woman who presents today complaining of right knee pain following a twisting injury in a fall on 06/23/17.  presented to the emergency department. States she had to be transported by ambulance as she was unable to bear weight. Points to medial knee. Has been using crutches, states can put a little weight on knee. Complains of pain shooting down lateral calf as well. + swelling, + decreased rom   Requests MRI.  Radiographs performed in the emergency department showed:   FINDINGS: Tricompartment degenerative change present. Degenerative changes particularly prominent about the lateral compartment. No acute bony abnormality identified.  IMPRESSION: Tricompartment degenerative change particularly prominent about the lateral compartment. No evidence of fracture or dislocation.  Assessment & Plan: Visit Diagnoses:  1. Right knee injury, initial encounter     Plan: Depomedrol injection today. Prefers to follow up with Marlou Sa. MRI ordered.  Follow-Up Instructions: Return in about 4 weeks (around 07/23/2017).   Right Knee Exam   Tenderness  The patient is experiencing tenderness in the medial retinaculum and medial joint line.  Range of Motion  Right knee extension: lacks 10 degrees.   Tests  Varus: negative Valgus: negative  Other  Erythema: absent Swelling: mild      Patient is alert, oriented, no adenopathy, well-dressed, normal affect, normal respiratory effort.   Imaging: No results found.  Labs: Lab Results  Component Value Date   HGBA1C 7.1 05/19/2017   HGBA1C 7.1 01/14/2017     HGBA1C 7.0 10/11/2016   LABORGA No Salmonella,Shigella,Campylobacter,Yersinia,or 02/20/2015   LABORGA No E.coli 0157:H7 isolated. 02/20/2015    Orders:  No orders of the defined types were placed in this encounter.  No orders of the defined types were placed in this encounter.    Procedures: No procedures performed  Clinical Data: No additional findings.  ROS:  All other systems negative, except as noted in the HPI. Review of Systems  Constitutional: Negative for chills and fever.  Cardiovascular: Negative for leg swelling.  Musculoskeletal: Positive for arthralgias and joint swelling.    Objective: Vital Signs: Ht 5\' 7"  (1.702 m)   Wt 201 lb (91.2 kg)   LMP 11/08/2013   BMI 31.48 kg/m   Specialty Comments:  No specialty comments available.  PMFS History: Patient Active Problem List   Diagnosis Date Noted  . Type 2 diabetes mellitus with hyperglycemia, without long-term current use of insulin ([place]) 01/08/2016  . Allergic rhinitis 11/22/2014  . 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[place]) 10/14/2013  . Elevated blood pressure (not hypertension) 04/18/2011  . Routine general medical examination at a health care facility 04/18/2011   Past Medical History:  Diagnosis Date  . Anxiety   . Breast cancer ([place])   . Contact lens/glasses fitting    wears contacts or glasses  . Depression   . Diabetes ([place]) 11/22/2014  . Hyperlipidemia   . Radiation 03/28/14-05/06/14  Left Breast 60 Gy    Family History  Problem Relation Age of Onset  . Squamous cell carcinoma Father   . Cancer Father   . Diabetes Father   . Hypertension Mother   . Diabetes Sister     Past Surgical History:  Procedure Laterality Date  . BREAST BIOPSY  1996   right breast, benign  . BREAST LUMPECTOMY WITH NEEDLE LOCALIZATION AND AXILLARY SENTINEL LYMPH NODE BX Left 10/29/2013   Procedure: BREAST LUMPECTOMY WITH NEEDLE LOCALIZATION AND AXILLARY SENTINEL LYMPH NODE BX;   Surgeon: Rolm Bookbinder, MD;  Location: [facility];  Service: General;  Laterality: Left;  . COLONOSCOPY  05/2011   WNL  . WISDOM TOOTH EXTRACTION     Social History   Occupational History  . Not on file.   Social History Main Topics  . Smoking status: Never Smoker  . Smokeless tobacco: Never Used  . Alcohol use Yes     Comment: 1-2 drinks a month  . Drug use: No  . Sexual activity: Yes

## 2017-07-22 ENCOUNTER — Ambulatory Visit (HOSPITAL_BASED_OUTPATIENT_CLINIC_OR_DEPARTMENT_OTHER): Payer: 59 | Admitting: Hematology and Oncology

## 2017-07-22 ENCOUNTER — Encounter: Payer: Self-pay | Admitting: Hematology and Oncology

## 2017-07-22 DIAGNOSIS — C50212 Malignant neoplasm of upper-inner quadrant of left female breast: Secondary | ICD-10-CM | POA: Diagnosis not present

## 2017-07-22 DIAGNOSIS — N951 Menopausal and female climacteric states: Secondary | ICD-10-CM

## 2017-07-22 DIAGNOSIS — Z170 Estrogen receptor positive status [ER+]: Secondary | ICD-10-CM

## 2017-07-22 NOTE — Assessment & Plan Note (Signed)
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 Occasional hot flashes : I discussed different measures including yoga exercise as well as medications including Effexor and Neurontin. she is already on Zoloft , I recommended her to continue with the same for now.  Breast cancer surveillance: 1. Mammogram done 01/07/2017  is normal, breast density category B 2. Breast exam 07/22/2017: Benign   Return to clinic in 1 year for follow-up

## 2017-07-22 NOTE — Progress Notes (Signed)
Patient Care Team: Lucille Passy, MD as PCP - General (Family Medicine) Dimitri Ped, RN as [facility] Management  DIAGNOSIS:  Encounter Diagnosis  Name Primary?  . Malignant neoplasm of upper-inner quadrant of left breast in female, estrogen receptor positive ([place])     SUMMARY OF ONCOLOGIC HISTORY:   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[facility])   10/29/2013 Surgery    left lumpectomy: 0.9 cm invasive ductal carcinoma, grade 2, ER/PR positive HER-2 negative, 1 sentinel node positive for micrometastatic, Oncotype DX recurrence score 29, 17% ROR      12/07/2013 - 02/08/2014 Chemotherapy    Taxotere and Cytoxan 4 adjuvant chemotherapy      03/30/2014 - 05/06/2014 Radiation Therapy    adjuvant radiation therapy      05/20/2014 -  Anti-estrogen oral therapy    tamoxifen 20 mg daily       CHIEF COMPLIANT: Follow-up on tamoxifen therapy  INTERVAL HISTORY: Veronica Rogers is a 56 year old with above-mentioned history of left breast cancer treated with lumpectomy adjuvant chemotherapy radiation and is currently on tamoxifen. She is tolerating tamoxifen extremely well. Hot flashes have improved significantly. She stays very active with the exercising at [employer]. Denies any lumps or nodules in breast.  REVIEW OF SYSTEMS:   Constitutional: Denies fevers, chills or abnormal weight loss Eyes: Denies blurriness of vision Ears, nose, mouth, throat, and face: Denies mucositis or sore throat Respiratory: Denies cough, dyspnea or wheezes Cardiovascular: Denies palpitation, chest discomfort Gastrointestinal:  Denies nausea, heartburn or change in bowel habits Skin: Denies abnormal skin rashes Lymphatics: Denies new lymphadenopathy or easy bruising Neurological:Denies numbness, tingling or new weaknesses Behavioral/Psych: Mood is stable, no new changes  Extremities: No lower extremity edema Breast:  denies any pain or lumps or nodules in  either breasts All other systems were reviewed with the patient and are negative.  I have reviewed the past medical history, past surgical history, social history and family history with the patient and they are unchanged from previous note.  ALLERGIES:  is allergic to erythromycin and tape.  MEDICATIONS:  Current Outpatient Prescriptions  Medication Sig Dispense Refill  . ACCU-CHEK FASTCLIX LANCETS MISC Use to check sugar 3 times daily 300 each 5  . BIOTIN PO Take 1 tablet by mouth every morning.    . Diclofenac Sodium 1.5 % SOLN Apply QID to knee 1 Bottle prn  . glipiZIDE (GLUCOTROL) 5 MG tablet TAKE 1 TABLET BY MOUTH DAILY BEFORE SUPPER. 30 tablet 3  . glucose blood (ACCU-CHEK GUIDE) test strip Use as instructed to check sugar 3 times daily 300 each 5  . HYDROcodone-acetaminophen (NORCO/VICODIN) 5-325 MG tablet Take 2 tablets by mouth every 4 (four) hours as needed. 16 tablet 0  . JANUVIA 100 MG tablet TAKE 1 TABLET BY MOUTH DAILY. 45 tablet 1  . meloxicam (MOBIC) 7.5 MG tablet Take 1 tablet (7.5 mg total) by mouth daily. 20 tablet 0  . metFORMIN (GLUCOPHAGE-XR) 750 MG 24 hr tablet TAKE 2 TABLETS BY MOUTH DAILY AT BEDTIME 60 tablet 11  . Multiple Vitamin (MULTIVITAMIN) tablet Take 1 tablet by mouth daily.    . sertraline (ZOLOFT) 50 MG tablet Take 50 mg by mouth every morning.     . tamoxifen (NOLVADEX) 20 MG tablet TAKE 1 TABLET BY MOUTH ONCE DAILY 90 tablet 3   No current facility-administered medications for this visit.     PHYSICAL EXAMINATION: ECOG PERFORMANCE STATUS: 0 - Asymptomatic  Vitals:  07/22/17 1524  BP: 137/78  Pulse: 87  Resp: 20  Temp: 97.6 F (36.4 C)   Filed Weights   07/22/17 1524  Weight: 200 lb 9.6 oz (91 kg)    GENERAL:alert, no distress and comfortable SKIN: skin color, texture, turgor are normal, no rashes or significant lesions EYES: normal, Conjunctiva are pink and non-injected, sclera clear OROPHARYNX:no exudate, no erythema and lips,  buccal mucosa, and tongue normal  NECK: supple, thyroid normal size, non-tender, without nodularity LYMPH:  no palpable lymphadenopathy in the cervical, axillary or inguinal LUNGS: clear to auscultation and percussion with normal breathing effort HEART: regular rate & rhythm and no murmurs and no lower extremity edema ABDOMEN:abdomen soft, non-tender and normal bowel sounds MUSCULOSKELETAL:no cyanosis of digits and no clubbing  NEURO: alert & oriented x 3 with fluent speech, no focal motor/sensory deficits EXTREMITIES: No lower extremity edema BREAST: No palpable masses or nodules in either right or left breasts. No palpable axillary supraclavicular or infraclavicular adenopathy no breast tenderness or nipple discharge. (exam performed in the presence of a chaperone)  LABORATORY DATA:  I have reviewed the data as listed   Chemistry      Component Value Date/Time   NA 140 07/24/2016 1302   NA 141 01/17/2015 1404   K 4.0 07/24/2016 1302   K 4.1 01/17/2015 1404   CL 102 07/24/2016 1302   CO2 28 07/24/2016 1302   CO2 28 01/17/2015 1404   BUN 20 07/24/2016 1302   BUN 16.9 01/17/2015 1404   CREATININE 0.67 07/24/2016 1302   CREATININE 0.69 04/11/2016 1506   CREATININE 0.7 01/17/2015 1404      Component Value Date/Time   CALCIUM 9.9 07/24/2016 1302   CALCIUM 9.3 01/17/2015 1404   ALKPHOS 34 (L) 07/24/2016 1302   ALKPHOS 43 01/17/2015 1404   AST 18 07/24/2016 1302   AST 16 01/17/2015 1404   ALT 19 07/24/2016 1302   ALT 17 01/17/2015 1404   BILITOT 0.5 07/24/2016 1302   BILITOT 0.28 01/17/2015 1404       Lab Results  Component Value Date   WBC 8.0 07/24/2016   HGB 12.0 07/24/2016   HCT 36.5 07/24/2016   MCV 82.7 07/24/2016   PLT 228.0 07/24/2016   NEUTROABS 4.6 07/24/2016    ASSESSMENT & PLAN:  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 Occasional hot flashes : I discussed different measures including yoga exercise as well as medications including Effexor and Neurontin. she is already on Zoloft , I recommended her to continue with the same for now.  Breast cancer surveillance: 1. Mammogram done 01/07/2017  is normal, breast density category B 2. Breast exam 07/22/2017: Benign   Return to clinic in 1 year for follow-up   I spent 25 minutes talking to the patient of which more than half was spent in counseling and coordination of care.  No orders of the defined types were placed in this encounter.  The patient has a good understanding of the overall plan. she agrees with it. she will call with any problems that may develop before the next visit here.   Rulon Eisenmenger, MD  07/22/17

## 2017-07-24 ENCOUNTER — Ambulatory Visit: Payer: No Typology Code available for payment source | Admitting: Hematology and Oncology

## 2017-07-28 ENCOUNTER — Ambulatory Visit (INDEPENDENT_AMBULATORY_CARE_PROVIDER_SITE_OTHER): Payer: 59 | Admitting: Orthopedic Surgery

## 2017-08-12 MED FILL — ACCU-CHEK GUIDE TEST STRIP: 90 days supply | Qty: 300 | Fill #1

## 2017-08-19 ENCOUNTER — Encounter: Payer: Self-pay | Admitting: Hematology and Oncology

## 2017-08-26 ENCOUNTER — Ambulatory Visit (INDEPENDENT_AMBULATORY_CARE_PROVIDER_SITE_OTHER): Payer: 59 | Admitting: Internal Medicine

## 2017-08-26 ENCOUNTER — Encounter: Payer: Self-pay | Admitting: Internal Medicine

## 2017-08-26 VITALS — BP 122/80 | HR 95 | Wt 200.8 lb

## 2017-08-26 DIAGNOSIS — E785 Hyperlipidemia, unspecified: Secondary | ICD-10-CM

## 2017-08-26 DIAGNOSIS — E1165 Type 2 diabetes mellitus with hyperglycemia: Secondary | ICD-10-CM

## 2017-08-26 LAB — LIPID PANEL
Cholesterol: 223 mg/dL — ABNORMAL HIGH (ref 0–200)
HDL: 33.9 mg/dL — AB (ref >39.00)
Total CHOL/HDL Ratio: 7
Triglycerides: 703 mg/dL — ABNORMAL HIGH (ref 0.0–149.0)

## 2017-08-26 LAB — POCT GLYCOSYLATED HEMOGLOBIN (HGB A1C): HEMOGLOBIN A1C: 7.2

## 2017-08-26 LAB — LDL CHOLESTEROL, DIRECT: Direct LDL: 83 mg/dL

## 2017-08-26 MED ORDER — GLIPIZIDE 5 MG PO TABS: ORAL_TABLET | ORAL | 3 refills | Status: DC

## 2017-08-26 MED ORDER — SITAGLIPTIN PHOSPHATE 100 MG PO TABS: 100.0000 mg | ORAL_TABLET | Freq: Every day | ORAL | 3 refills | Status: DC

## 2017-08-26 MED ORDER — METFORMIN HCL ER 750 MG PO TB24: ORAL_TABLET | ORAL | 3 refills | Status: DC

## 2017-08-26 MED FILL — JANUVIA 100 MG TABLET: 100 | 45 days supply | Qty: 45 | Fill #1

## 2017-08-26 MED FILL — SERTRALINE HCL 50 MG TABLET: 50 | 90 days supply | Qty: 90 | Fill #1

## 2017-08-26 MED FILL — METFORMIN HCL ER 750 MG TAB: 750 | 90 days supply | Qty: 180 | Fill #3

## 2017-08-26 MED FILL — TAMOXIFEN CITRATE 20 MG TAB: 20 | 90 days supply | Qty: 90 | Fill #1

## 2017-08-26 MED FILL — glipiZIDE 5 MG TABS: 5 | 90 days supply | Qty: 180 | Fill #0

## 2017-08-26 NOTE — Progress Notes (Addendum)
Patient ID: Veronica Rogers, female   DOB: 30-Jul-1961, 56 y.o.   MRN: [id number]   HPI: Veronica Rogers is a 56 y.o.-year-old female, returning for f/u for DM2, dx in 2015 (after steroids), non-insulin-dependent, uncontrolled, without long term complications. Last visit 3 mo ago.  Last hemoglobin A1c was: Lab Results  Component Value Date   HGBA1C 7.1 05/19/2017   HGBA1C 7.1 01/14/2017   HGBA1C 7.0 10/11/2016   Pt is on a regimen of: - Metformin ER 1500 mg with dinner - Januvia 100 mg at lunch - started 12/2016 - ran out 6 days ago - Glipizide 5 mg before dinner in last 6 days. We stopped Glipizide 5 mg before dinner in 12/2016 She was on insulin during ChTx. She was on regular metformin >> GI upset (diarrhea). She has frequent urination.  Pt checks her sugars 0-2x a day (no log, no meter):  - am:  95, 135-152, 162 >> 104, 124-168, 173 (no Januvia and Glip.) - 2h after b'fast: 75 (after glimepiride), 132-168 >> n/c >> 105, 112 >> n/c - before lunch: n/c >> 174 >> n/c >> 109 >> 98-123 >> 90-138 >> 156 - 2h after lunch: n/c >> 92, 132-148 >> 176 >> n/c >> 109 >> n/c - before dinner: n/c >> 124 >> 91-128, 160 >> n/c >> 110 >> 106 >> 113-147, 183 - 2h after dinner:  156, 172, 231 >> n/c >> 91, 147, 197, 212 >> 113, 129 >> n/c - bedtime: n/c  - nighttime: n/c >> 82 >> n/c >> 82-124 >> n/c >> 135 No lows. Lowest sugar was 105 >> 95; she has hypoglycemia awareness at 70.  Highest sugar was  210 >> 162  Glucometer: True test >> AccuChek  Pt's meals are: - Breakfast: cereals or 2 eggs or skips - 10:30 am: PB crackers - Lunch: leftovers from home: sandwich; soup; meat + veggies; chinese; salad - Dinner: same as lunch [employer] - 2x a week - off and on. Bicycle, walks.  - No CKD, last BUN/creatinine:  Lab Results  Component Value Date   BUN 20 07/24/2016   CREATININE 0.67 07/24/2016   - last set of lipids: Lab Results  Component Value Date   CHOL 202 (H) 07/24/2016   HDL  44.30 07/24/2016   LDLCALC 63 02/27/2015   LDLDIRECT 112.0 07/24/2016   TRIG 297.0 (H) 07/24/2016   CHOLHDL 5 07/24/2016   - last eye exam was in 02/2017 >> no DR. Dr. Katy Fitch. - she denies numbness and tingling in her feet. She has a history of plantar fasciitis.  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 On Tamoxifen for 5 years >> she will finish the course in 2020.  ROS: Constitutional: no weight gain/no weight loss, no fatigue, no subjective hyperthermia, no subjective hypothermia Eyes: no blurry vision, no xerophthalmia ENT: no sore throat, no nodules palpated in throat, no dysphagia, no odynophagia, no hoarseness Cardiovascular: no CP/no SOB/no palpitations/no leg swelling Respiratory: no cough/no SOB/no wheezing Gastrointestinal: no N/no V/no D/no C/no acid reflux Musculoskeletal: no muscle aches/no joint aches Skin: no rashes, no hair loss Neurological: no tremors/no numbness/no tingling/no dizziness  I reviewed pt's medications, allergies, PMH, social hx, family hx, and changes were documented in the history of present illness. Otherwise, unchanged from my initial visit note.  Past Medical History:  Diagnosis Date  . Anxiety   . Breast cancer ([facility])   . Contact lens/glasses fitting    wears contacts or glasses  . Depression   . Diabetes (  [place]) 11/22/2014  . Hyperlipidemia   . Radiation 03/28/14-05/06/14   Left Breast 60 Gy   Past Surgical History:  Procedure Laterality Date  . BREAST BIOPSY  1996   right breast, benign  . BREAST LUMPECTOMY WITH NEEDLE LOCALIZATION AND AXILLARY SENTINEL LYMPH NODE BX Left 10/29/2013   Procedure: BREAST LUMPECTOMY WITH NEEDLE LOCALIZATION AND AXILLARY SENTINEL LYMPH NODE BX;  Surgeon: Rolm Bookbinder, MD;  Location: [facility];  Service: General;  Laterality: Left;  . COLONOSCOPY  05/2011   WNL  . WISDOM TOOTH EXTRACTION     Social History   Social History  . Marital Status: Married    Spouse Name: N/A  . Number of Children: 2    Occupational History  . [employer] - support rep   Social History Main Topics  . Smoking status: Never Smoker   . Smokeless tobacco: Never Used  . Alcohol Use: Yes     Comment: 1-2 drinks a month  . Drug Use: No   Current Outpatient Prescriptions on File Prior to Visit  Medication Sig Dispense Refill  . ACCU-CHEK FASTCLIX LANCETS MISC Use to check sugar 3 times daily 300 each 5  . glipiZIDE (GLUCOTROL) 5 MG tablet TAKE 1 TABLET BY MOUTH DAILY BEFORE SUPPER. 30 tablet 3  . glucose blood (ACCU-CHEK GUIDE) test strip Use as instructed to check sugar 3 times daily 300 each 5  . JANUVIA 100 MG tablet TAKE 1 TABLET BY MOUTH DAILY. 45 tablet 1  . meloxicam (MOBIC) 7.5 MG tablet Take 1 tablet (7.5 mg total) by mouth daily. 20 tablet 0  . metFORMIN (GLUCOPHAGE-XR) 750 MG 24 hr tablet TAKE 2 TABLETS BY MOUTH DAILY AT BEDTIME 60 tablet 11  . Multiple Vitamin (MULTIVITAMIN) tablet Take 1 tablet by mouth daily.    . sertraline (ZOLOFT) 50 MG tablet Take 50 mg by mouth every morning.     . tamoxifen (NOLVADEX) 20 MG tablet TAKE 1 TABLET BY MOUTH ONCE DAILY 90 tablet 3   No current facility-administered medications on file prior to visit.    Allergies  Allergen Reactions  . Erythromycin Other (See Comments)    Stomach pain  . Tape Rash   Family History  Problem Relation Age of Onset  . Squamous cell carcinoma Father   . Cancer Father   . Diabetes Father   . Hypertension Mother   . Diabetes Sister    PE: BP 122/80   Pulse 95   Wt 200 lb 12.8 oz (91.1 kg)   LMP 11/08/2013   SpO2 96%   BMI 31.45 kg/m  Body mass index is 31.45 kg/m. Wt Readings from Last 3 Encounters:  08/26/17 200 lb 12.8 oz (91.1 kg)  07/22/17 200 lb 9.6 oz (91 kg)  06/25/17 201 lb (91.2 kg)   Constitutional: overweight, in NAD Eyes: PERRLA, EOMI, no exophthalmos ENT: moist mucous membranes, no thyromegaly, no cervical lymphadenopathy Cardiovascular: RRR at the end of the visit, No  MRG Respiratory: CTA B Gastrointestinal: abdomen soft, NT, ND, BS+ Musculoskeletal: no deformities, strength intact in all 4 Skin: moist, warm, no rashes Neurological: no tremor with outstretched hands, DTR normal in all 4  ASSESSMENT: 1. DM2, non-insulin-dependent, uncontrolled, without long term complications, but with hyperglycemia  2. HL  PLAN:  1. Patient with long-standing, uncontrolled DM, on Metformin and Januvia, with slightly higher sugars at this visit. She ran out of Januvia and restarted a low dose Glipizide before dinner and occasionally took this in  am if sugars were high when waking up. As sugars higher >> will continue [place] with lunch and add Glipizide 2x a day. I advised her to decrease the am Glipizide dose if she develops lows. - I suggested to:  Patient Instructions  Please continue:  - Metformin ER 1500 mg with dinner. - Januvia 100 mg before lunch  Restart: - Glipizide 5 mg 2x a day before b'fast and dinner (if sugars mid-day are too low >> decrease am Glipizide to 2.5 mg)  Please return in 3 months with your sugar log.   - today, HbA1c is 7.2% (slightly higher) - continue checking sugars at different times of the day - check 1x a day, rotating checks - advised for yearly eye exams >> she is UTD - refilled her DM meds - Return to clinic in 3 mo with sugar log    2. HL - not on a statin - TG high at last check - will check Lipids and LFTs today - discussed about improving diet  Office Visit on 08/26/2017  Component Date Value Ref Range Status  . Hemoglobin A1C 08/26/2017 7.2   Final  . Sodium 08/26/2017 140  135 - 146 mmol/L Final  . Potassium 08/26/2017 4.0  3.5 - 5.3 mmol/L Final  . Chloride 08/26/2017 102  98 - 110 mmol/L Final  . CO2 08/26/2017 22  20 - 32 mmol/L Final   Comment: ** Please note change in reference range(s). **     . Glucose, Bld 08/26/2017 124* 65 - 99 mg/dL Final  . BUN 08/26/2017 24  7 - 25 mg/dL Final  . Creat  08/26/2017 0.90  0.50 - 1.05 mg/dL Final   Comment:   For patients > or = 57 years of age: The upper reference limit for Creatinine is approximately 13% higher for people identified as African-American.     . Total Bilirubin 08/26/2017 0.4  0.2 - 1.2 mg/dL Final  . Alkaline Phosphatase 08/26/2017 37  33 - 130 U/L Final  . AST 08/26/2017 17  10 - 35 U/L Final  . ALT 08/26/2017 16  6 - 29 U/L Final  . Total Protein 08/26/2017 6.7  6.1 - 8.1 g/dL Final  . Albumin 08/26/2017 4.5  3.6 - 5.1 g/dL Final  . Calcium 08/26/2017 9.9  8.6 - 10.4 mg/dL Final  . GFR, Est African American 08/26/2017 83  >=60 mL/min Final  . GFR, Est Non African American 08/26/2017 72  >=60 mL/min Final  . Cholesterol 08/26/2017 223* 0 - 200 mg/dL Final   ATP III Classification       Desirable:  < 200 mg/dL               Borderline High:  200 - 239 mg/dL          High:  > = 240 mg/dL  . Triglycerides 08/26/2017 703.0 Triglyceride is over 400; calculations on Lipids are invalid.* 0.0 - 149.0 mg/dL Final   Normal:  <150 mg/dLBorderline High:  150 - 199 mg/dL  . HDL 08/26/2017 33.90* >39.00 mg/dL Final  . Total CHOL/HDL Ratio 08/26/2017 7   Final                  Men          Women1/2 Average Risk     3.4          3.3Average Risk          5.0  4.42X Average Risk          9.6          7.13X Average Risk          15.0          11.0                      . Direct LDL 08/26/2017 83.0  mg/dL Final   Optimal:  <100 mg/dLNear or Above Optimal:  100-129 mg/dLBorderline High:  130-159 mg/dLHigh:  160-189 mg/dLVery High:  >190 mg/dL   TG very high >> she absolutely needs to change her diet. Will also start Fenofibrate.  Philemon Kingdom, MD PhD [facility]

## 2017-08-26 NOTE — Patient Instructions (Addendum)
Please continue:  - Metformin ER 1500 mg with dinner. - Januvia 100 mg before lunch  Restart: - Glipizide 5 mg 2x a day before b'fast and dinner (if sugars mid-day are too low >> decrease am Glipizide to 2.5 mg)  Please return in 3 months with your sugar log.

## 2017-08-27 LAB — COMPLETE METABOLIC PANEL WITH GFR
ALBUMIN: 4.5 g/dL (ref 3.6–5.1)
ALK PHOS: 37 U/L (ref 33–130)
ALT: 16 U/L (ref 6–29)
AST: 17 U/L (ref 10–35)
BUN: 24 mg/dL (ref 7–25)
CALCIUM: 9.9 mg/dL (ref 8.6–10.4)
CHLORIDE: 102 mmol/L (ref 98–110)
CO2: 22 mmol/L (ref 20–32)
CREATININE: 0.9 mg/dL (ref 0.50–1.05)
GFR, EST AFRICAN AMERICAN: 83 mL/min (ref >=60)
GFR, Est Non African American: 72 mL/min (ref >=60)
Glucose, Bld: 124 mg/dL — ABNORMAL HIGH (ref 65–99)
POTASSIUM: 4 mmol/L (ref 3.5–5.3)
Sodium: 140 mmol/L (ref 135–146)
Total Bilirubin: 0.4 mg/dL (ref 0.2–1.2)
Total Protein: 6.7 g/dL (ref 6.1–8.1)

## 2017-08-27 MED ORDER — FENOFIBRATE 145 MG PO TABS: 145.0000 mg | ORAL_TABLET | Freq: Every day | ORAL | 3 refills | Status: DC

## 2017-08-27 NOTE — Addendum Note (Signed)
Addended by: Philemon Kingdom on: 08/27/2017 07:32 AM   Modules accepted: Orders

## 2017-09-02 ENCOUNTER — Ambulatory Visit: Payer: 59 | Admitting: Internal Medicine

## 2017-11-10 ENCOUNTER — Encounter: Payer: Self-pay | Admitting: Family Medicine

## 2017-11-10 ENCOUNTER — Other Ambulatory Visit: Payer: Self-pay | Admitting: Family Medicine

## 2017-11-10 MED ORDER — MELOXICAM 7.5 MG PO TABS: 7.5000 mg | ORAL_TABLET | Freq: Every day | ORAL | 0 refills | Status: DC

## 2017-11-12 ENCOUNTER — Other Ambulatory Visit: Payer: Self-pay | Admitting: Hematology and Oncology

## 2017-11-12 DIAGNOSIS — Z9889 Other specified postprocedural states: Secondary | ICD-10-CM

## 2017-11-19 ENCOUNTER — Other Ambulatory Visit: Payer: Self-pay | Admitting: Family Medicine

## 2017-11-19 MED ORDER — MELOXICAM 7.5 MG PO TABS: 7.5000 mg | ORAL_TABLET | Freq: Every day | ORAL | 0 refills | Status: DC

## 2017-11-19 MED FILL — JANUVIA 100 MG TABLET: 100 | 90 days supply | Qty: 90 | Fill #0

## 2017-11-19 MED FILL — MELOXICAM 7.5 MG TABLET: 7.5 | 20 days supply | Qty: 20 | Fill #0

## 2017-11-19 NOTE — Telephone Encounter (Signed)
Rx sent in. Rx had a status of print instead of going electronically, so pharmacy never received medication. Message sent to patient.

## 2017-11-19 NOTE — Telephone Encounter (Signed)
Wasn't sure if Dr. Deborra Medina wanted to refill her Mobic. She gave her 20 tabs 11/10/17. Thanks.

## 2017-11-19 NOTE — Telephone Encounter (Signed)
Copied from [place] [id number]. Topic: Inquiry >> Nov 19, 2017  1:54 PM Pricilla Handler wrote: Reason for CRM: Patient called stating that she has been waiting for Dr. Deborra Medina to send her prescription of generic MOBIC to her pharmacy for days. Patient stated that her knee is extremely painful and that she needs the prescription today before holiday.

## 2017-11-27 MED FILL — METFORMIN HCL ER 750 MG TAB: 750 | 90 days supply | Qty: 180 | Fill #0

## 2017-11-27 MED FILL — SERTRALINE HCL 50 MG TABLET: 50 | 90 days supply | Qty: 90 | Fill #2

## 2017-11-27 MED FILL — TAMOXIFEN CITRATE 20 MG TAB: 20 | 90 days supply | Qty: 90 | Fill #2

## 2017-11-27 MED FILL — glipiZIDE 5 MG TABS: 5 | 90 days supply | Qty: 180 | Fill #1

## 2017-11-28 ENCOUNTER — Telehealth (INDEPENDENT_AMBULATORY_CARE_PROVIDER_SITE_OTHER): Payer: Self-pay | Admitting: Orthopedic Surgery

## 2017-11-28 ENCOUNTER — Ambulatory Visit
Admission: RE | Admit: 2017-11-28 | Discharge: 2017-11-28 | Disposition: A | Discharge status: Home / Self Care | Payer: 59 | Source: Ambulatory Visit | Attending: Family | Admitting: Family

## 2017-11-28 ENCOUNTER — Encounter: Payer: Self-pay | Admitting: Internal Medicine

## 2017-11-28 ENCOUNTER — Ambulatory Visit (INDEPENDENT_AMBULATORY_CARE_PROVIDER_SITE_OTHER): Payer: 59 | Admitting: Internal Medicine

## 2017-11-28 VITALS — BP 130/80 | HR 96 | Wt 203.4 lb

## 2017-11-28 DIAGNOSIS — M25461 Effusion, right knee: Secondary | ICD-10-CM | POA: Diagnosis not present

## 2017-11-28 DIAGNOSIS — E785 Hyperlipidemia, unspecified: Secondary | ICD-10-CM | POA: Diagnosis not present

## 2017-11-28 DIAGNOSIS — S8991XA Unspecified injury of right lower leg, initial encounter: Secondary | ICD-10-CM

## 2017-11-28 DIAGNOSIS — E1165 Type 2 diabetes mellitus with hyperglycemia: Secondary | ICD-10-CM | POA: Diagnosis not present

## 2017-11-28 LAB — POCT GLYCOSYLATED HEMOGLOBIN (HGB A1C): Hemoglobin A1C: 6.9

## 2017-11-28 MED ORDER — GLIPIZIDE 5 MG PO TABS: ORAL_TABLET | ORAL | 3 refills | Status: DC

## 2017-11-28 MED ORDER — FENOFIBRATE 145 MG PO TABS: 145.0000 mg | ORAL_TABLET | Freq: Every day | ORAL | 3 refills | Status: DC

## 2017-11-28 NOTE — Progress Notes (Signed)
Patient ID: Veronica Rogers, female   DOB: 26-Jan-1961, 56 y.o.   MRN: [id number]   HPI: Veronica Rogers is a 56 y.o.-year-old female, returning for f/u for DM2, dx in 2015 (after steroids), non-insulin-dependent, uncontrolled, without long term complications. Last visit 3 months ago.  Last hemoglobin A1c was: Lab Results  Component Value Date   HGBA1C 7.2 08/26/2017   HGBA1C 7.1 05/19/2017   HGBA1C 7.1 01/14/2017   Pt is on a regimen of: - Metformin ER 1500 mg with dinner - Januvia 100 mg at lunch - started 12/2016 >> misses doses - Glipizide 5 mg twice a day before b'fast and dinner We stopped Glipizide 5 mg before dinner in 12/2016 She was on insulin during ChTx. She was on regular metformin >> GI upset (diarrhea). She has frequent urination.  Pt checks her sugars 0-2 times a day (no log, no meter) >> better: - am:  95, 135-152, 162 >> 104, 124-168, 173 >> 138-147 - 2h after b'fast:  n/c >> 105, 112 >> n/c - before lunch:  98-123 >> 90-138 >> 156 >> 90-110 - 2h after lunch: 176 >> n/c >> 109 >> n/c - before dinner: 110 >> 106 >> 113-147, 183 >> n/c - 2h after dinner: 91, 147, 197, 212 >> 113, 129 >> n/c >> 160s - bedtime: n/c  - nighttime:  82 >> n/c >> 82-124 >> n/c >> 135 >> 110 Lowest sugar was 105 >> 95 >> 90; she has hypoglycemia awareness at 70.  Highest sugar was  210 >> 162 >> 235 (ate sweets) x1  Glucometer: True test >> AccuChek  Pt's meals are: - Breakfast: cereals or 2 eggs or skips - 10:30 am: PB crackers - Lunch: leftovers from home: sandwich; soup; meat + veggies; chinese; salad - Dinner: same as lunch [employer] -twice a week off and on-bicycle and walking.  -No CKD, last BUN/creatinine:  Lab Results  Component Value Date   BUN 24 08/26/2017   CREATININE 0.90 08/26/2017   -+ HL; last set of lipids: Lab Results  Component Value Date   CHOL 223 (H) 08/26/2017   HDL 33.90 (L) 08/26/2017   LDLCALC 63 02/27/2015   LDLDIRECT 83.0 08/26/2017    TRIG (H) 08/26/2017    703.0 Triglyceride is over 400; calculations on Lipids are invalid.   CHOLHDL 7 08/26/2017  I recomm. fenofibrate at last visit. She did not start yet >> Rx was sent to the wrong pharmacy. - last eye exam was in 02/2017: No DR. Dr. Katy Fitch. - Denies numbness and tingling in her feet. She has a history of plantar fasciitis.  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 On Tamoxifen for 5 years >> she will finish the course in 2020.  ROS: Constitutional: no weight gain/no weight loss, no fatigue, no subjective hyperthermia, no subjective hypothermia Eyes: no blurry vision, no xerophthalmia ENT: no sore throat, no nodules palpated in throat, no dysphagia, no odynophagia, no hoarseness Cardiovascular: no CP/no SOB/no palpitations/no leg swelling Respiratory: no cough/no SOB/no wheezing Gastrointestinal: no N/no V/no D/no C/no acid reflux Musculoskeletal: no muscle aches/no joint aches Skin: no rashes, no hair loss Neurological: no tremors/no numbness/no tingling/no dizziness  I reviewed pt's medications, allergies, PMH, social hx, family hx, and changes were documented in the history of present illness. Otherwise, unchanged from my initial visit note.   Past Medical History:  Diagnosis Date  . Anxiety   . Breast cancer ([place])   . Contact lens/glasses fitting    wears contacts or glasses  .  Depression   . Diabetes ([place]) 11/22/2014  . Hyperlipidemia   . Radiation 03/28/14-05/06/14   Left Breast 60 Gy   Past Surgical History:  Procedure Laterality Date  . BREAST BIOPSY  1996   right breast, benign  . BREAST LUMPECTOMY WITH NEEDLE LOCALIZATION AND AXILLARY SENTINEL LYMPH NODE BX Left 10/29/2013   Procedure: BREAST LUMPECTOMY WITH NEEDLE LOCALIZATION AND AXILLARY SENTINEL LYMPH NODE BX;  Surgeon: Rolm Bookbinder, MD;  Location: [facility];  Service: General;  Laterality: Left;  . COLONOSCOPY  05/2011   WNL  . WISDOM TOOTH EXTRACTION     Social History   Social History   . Marital Status: Married    Spouse Name: N/A  . Number of Children: 2   Occupational History  . [employer] - support rep   Social History Main Topics  . Smoking status: Never Smoker   . Smokeless tobacco: Never Used  . Alcohol Use: Yes     Comment: 1-2 drinks a month  . Drug Use: No   Current Outpatient Medications on File Prior to Visit  Medication Sig Dispense Refill  . ACCU-CHEK FASTCLIX LANCETS MISC Use to check sugar 3 times daily 300 each 5  . fenofibrate (TRICOR) 145 MG tablet Take 1 tablet (145 mg total) by mouth daily. 90 tablet 3  . glipiZIDE (GLUCOTROL) 5 MG tablet TAKE 1 TABLET BY MOUTH 2x DAILY BEFORE MEALS 180 tablet 3  . glucose blood (ACCU-CHEK GUIDE) test strip Use as instructed to check sugar 3 times daily 300 each 5  . meloxicam (MOBIC) 7.5 MG tablet Take 1 tablet (7.5 mg total) by mouth daily. 20 tablet 0  . metFORMIN (GLUCOPHAGE-XR) 750 MG 24 hr tablet TAKE 2 TABLETS BY MOUTH DAILY AT BEDTIME 180 tablet 3  . Multiple Vitamin (MULTIVITAMIN) tablet Take 1 tablet by mouth daily.    . sertraline (ZOLOFT) 50 MG tablet Take 50 mg by mouth every morning.     . sitaGLIPtin (JANUVIA) 100 MG tablet Take 1 tablet (100 mg total) by mouth daily. 90 tablet 3  . tamoxifen (NOLVADEX) 20 MG tablet TAKE 1 TABLET BY MOUTH ONCE DAILY 90 tablet 3   No current facility-administered medications on file prior to visit.    Allergies  Allergen Reactions  . Erythromycin Other (See Comments)    Stomach pain  . Tape Rash   Family History  Problem Relation Age of Onset  . Squamous cell carcinoma Father   . Cancer Father   . Diabetes Father   . Hypertension Mother   . Diabetes Sister    PE: BP 130/80   Pulse 96   Wt 203 lb 6.4 oz (92.3 kg)   LMP 11/08/2013   SpO2 97%   BMI 31.86 kg/m  Body mass index is 31.86 kg/m. Wt Readings from Last 3 Encounters:  08/26/17 200 lb 12.8 oz (91.1 kg)  07/22/17 200 lb 9.6 oz (91 kg)  06/25/17 201 lb (91.2 kg)    Constitutional: overweight, in NAD Eyes: PERRLA, EOMI, no exophthalmos ENT: moist mucous membranes, no thyromegaly, no cervical lymphadenopathy Cardiovascular: RRR, No MRG Respiratory: CTA B Gastrointestinal: abdomen soft, NT, ND, BS+ Musculoskeletal: no deformities, strength intact in all 4 Skin: moist, warm, no rashes Neurological: no tremor with outstretched hands, DTR normal in all 4  ASSESSMENT: 1. DM2, non-insulin-dependent, uncontrolled, without long term complications, but with hyperglycemia  2. HL  PLAN:  1. Patient with long-standing, uncontrolled diabetes, on metformin, Januvia, and also glipizide.  She takes Januvia seldom, at lunch as she forgets it. Takes Glipizide before b'fast and dinner.  - She c/o occasional too low sugars (did not check when low) before lunch >> will stop Glipizide in am and move Januvia before b'fast - will continue Metformin and Glipizide at night - again discussed about cutting down on concentrated sweets and fatty foods - I suggested to:  Patient Instructions  Please continue:  - Metformin ER 1500 mg with dinner.  Move: - Januvia 100 mg before b'fast  Decrease: - Glipizide to 5 mg before dinner only   Please return in 3 months with your sugar log. '  - today, HbA1c is 6.9% (better) - continue checking sugars at different times of the day - check 1x a day, rotating checks - advised for yearly eye exams >> she is UTD - Return to clinic in 3 mo with sugar log    2. HL - Triglycerides were even higher at last check >> I recommended Fenofibrate at last visit >> did not start yet >> will re-send to another pharmacy - We again discussed about the need to improve diet and we may stop Fenofibrate then - She is not on a statin  Philemon Kingdom, MD PhD [facility] Endocrinology

## 2017-11-28 NOTE — Patient Instructions (Addendum)
Please continue:  - Metformin ER 1500 mg with dinner.  Move: - Januvia 100 mg before b'fast  Decrease: - Glipizide to 5 mg before dinner only   Please return in 3 months with your sugar log.

## 2017-11-28 NOTE — Addendum Note (Signed)
Addended by: Drucilla Schmidt on: 11/28/2017 04:33 PM   Modules accepted: Orders

## 2017-11-28 NOTE — Telephone Encounter (Signed)
Called patient left message to return call to schedule appointment for MRI review.

## 2017-12-01 ENCOUNTER — Encounter: Payer: Self-pay | Admitting: Hematology and Oncology

## 2017-12-01 DIAGNOSIS — Z01419 Encounter for gynecological examination (general) (routine) without abnormal findings: Secondary | ICD-10-CM | POA: Diagnosis not present

## 2017-12-05 MED FILL — FENOFIBRATE 145 MG TABLET: 145 | 90 days supply | Qty: 90 | Fill #0

## 2017-12-09 ENCOUNTER — Other Ambulatory Visit: Payer: Self-pay

## 2017-12-09 NOTE — Patient Outreach (Signed)
[facility]) Care Management  12/09/2017  KAYLON LAROCHE [birth year] [id number]   Case closed in [place].  Member is being followed by the [employer] program Member enrolled in an external program. Peter Garter RN, [facility] Care Management Coordinator-Link to [place] Management [phone]

## 2017-12-12 ENCOUNTER — Ambulatory Visit (INDEPENDENT_AMBULATORY_CARE_PROVIDER_SITE_OTHER): Payer: 59 | Admitting: Orthopedic Surgery

## 2017-12-12 ENCOUNTER — Encounter (INDEPENDENT_AMBULATORY_CARE_PROVIDER_SITE_OTHER): Payer: Self-pay | Admitting: Orthopedic Surgery

## 2017-12-12 DIAGNOSIS — M1711 Unilateral primary osteoarthritis, right knee: Secondary | ICD-10-CM

## 2017-12-13 ENCOUNTER — Encounter (INDEPENDENT_AMBULATORY_CARE_PROVIDER_SITE_OTHER): Payer: Self-pay | Admitting: Orthopedic Surgery

## 2017-12-13 DIAGNOSIS — M1711 Unilateral primary osteoarthritis, right knee: Secondary | ICD-10-CM | POA: Diagnosis not present

## 2017-12-13 MED ORDER — METHYLPREDNISOLONE ACETATE 40 MG/ML IJ SUSP
40.0000 mg | INTRAMUSCULAR | Status: AC | PRN
  Administered 2017-12-13: 40 mg via INTRA_ARTICULAR

## 2017-12-13 MED ORDER — BUPIVACAINE HCL 0.25 % IJ SOLN
4.0000 mL | INTRAMUSCULAR | Status: AC | PRN
  Administered 2017-12-13: 4 mL via INTRA_ARTICULAR

## 2017-12-13 MED ORDER — LIDOCAINE HCL 1 % IJ SOLN
5.0000 mL | INTRAMUSCULAR | Status: AC | PRN
  Administered 2017-12-13: 5 mL

## 2017-12-13 NOTE — Progress Notes (Signed)
Office Visit Note   Patient: Veronica Rogers           Date of Birth: 1961-01-14           MRN: [id number] Visit Date: 12/12/2017 Requested by: Veronica Passy, MD [place], [place] [address] PCP: Veronica Passy, MD  Subjective: Chief Complaint  Patient presents with  . Right Knee - Follow-up    HPI: Veronica Rogers is a 56 year old patient with right knee pain.  Since I have seen her she has had an MRI scan.  She states that the injection helped her right knee.  She states that the injection helped her right knee.  MRI scan shows severe lateral compartment arthritis as well as medial compartment arthritis as well as degenerative meniscal tearing.  She reports giving way and difficulty ambulating              ROS: All systems reviewed are negative as they relate to the chief complaint within the history of present illness.  Patient denies  fevers or chills.   Assessment & Plan: Visit Diagnoses:  1. Unilateral primary osteoarthritis, right knee     Plan: Impression is significant right knee flexion contracture with worsening arthritis no arthroscopically treatable pathology in the knee.  Plan is injection today with preapproval for Synvisc.  Follow-up with me as needed.  She is heading for knee replacement sometime in the future  Follow-Up Instructions: Return if symptoms worsen or fail to improve.   Orders:  No orders of the defined types were placed in this encounter.  No orders of the defined types were placed in this encounter.     Procedures: Large Joint Inj: R knee on 12/13/2017 11:19 AM Indications: diagnostic evaluation, joint swelling and pain Details: 18 G 1.5 in needle, superolateral approach  Arthrogram: No  Medications: 5 mL lidocaine 1 %; 40 mg methylPREDNISolone acetate 40 MG/ML; 4 mL bupivacaine 0.25 % Outcome: tolerated well, no immediate complications Procedure, treatment alternatives, risks and benefits explained, specific risks discussed.  Consent was given by the patient. Immediately prior to procedure a time out was called to verify the correct patient, procedure, equipment, support staff and site/side marked as required. Patient was prepped and draped in the usual sterile fashion.       Clinical Data: No additional findings.  Objective: Vital Signs: LMP 11/08/2013   Physical Exam:   Constitutional: Patient appears well-developed HEENT:  Head: Normocephalic Eyes:EOM are normal Neck: Normal range of motion Cardiovascular: Normal rate Pulmonary/chest: Effort normal Neurologic: Patient is alert Skin: Skin is warm Psychiatric: Patient has normal mood and affect    Ortho Exam: Orthopedic exam demonstrates antalgic gait to the right with trace effusion in the right knee patient has about a 12-13 degree flexion contracture in the right knee which is not present in the left knee.  Collateral and cruciate ligaments are stable.  Extensor mechanism is intact.  Patient has normal flexion bilaterally  Specialty Comments:  No specialty comments available.  Imaging: No results found.   PMFS History: Patient Active Problem List   Diagnosis Date Noted  . Hyperlipidemia 08/26/2017  . Type 2 diabetes mellitus with hyperglycemia, without long-term current use of insulin ([place]) 01/08/2016  . Allergic rhinitis 11/22/2014  . 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[place]) 10/14/2013  . Elevated blood pressure (not hypertension) 04/18/2011  . Routine general medical examination at a health care facility 04/18/2011   Past Medical History:  Diagnosis Date  .  Anxiety   . Breast cancer ([place])   . Contact lens/glasses fitting    wears contacts or glasses  . Depression   . Diabetes ([place]) 11/22/2014  . Hyperlipidemia   . Radiation 03/28/14-05/06/14   Left Breast 60 Gy    Family History  Problem Relation Age of Onset  . Squamous cell carcinoma Father   . Cancer Father   . Diabetes Father   . Hypertension  Mother   . Diabetes Sister     Past Surgical History:  Procedure Laterality Date  . BREAST BIOPSY  1996   right breast, benign  . BREAST LUMPECTOMY WITH NEEDLE LOCALIZATION AND AXILLARY SENTINEL LYMPH NODE BX Left 10/29/2013   Procedure: BREAST LUMPECTOMY WITH NEEDLE LOCALIZATION AND AXILLARY SENTINEL LYMPH NODE BX;  Surgeon: Rolm Bookbinder, MD;  Location: [facility];  Service: General;  Laterality: Left;  . COLONOSCOPY  05/2011   WNL  . WISDOM TOOTH EXTRACTION     Social History   Occupational History  . Not on file  Tobacco Use  . Smoking status: Never Smoker  . Smokeless tobacco: Never Used  Substance and Sexual Activity  . Alcohol use: Yes    Comment: 1-2 drinks a month  . Drug use: No  . Sexual activity: Yes

## 2017-12-19 ENCOUNTER — Encounter (INDEPENDENT_AMBULATORY_CARE_PROVIDER_SITE_OTHER): Payer: Self-pay | Admitting: Orthopedic Surgery

## 2017-12-19 ENCOUNTER — Encounter: Payer: Self-pay | Admitting: Hematology and Oncology

## 2018-01-05 ENCOUNTER — Other Ambulatory Visit: Payer: Self-pay | Admitting: Family Medicine

## 2018-01-06 ENCOUNTER — Telehealth (INDEPENDENT_AMBULATORY_CARE_PROVIDER_SITE_OTHER): Payer: Self-pay

## 2018-01-06 MED FILL — MELOXICAM 7.5 MG TABLET: 7.5 | 20 days supply | Qty: 20 | Fill #0

## 2018-01-06 NOTE — Telephone Encounter (Signed)
Tried calling patient to let her know that we were trying to get her BV for monovisc injection but per web portal the insurance that we have is no longer active. I asked her to call us to update insurance info.

## 2018-01-09 ENCOUNTER — Ambulatory Visit
Admission: RE | Admit: 2018-01-09 | Discharge: 2018-01-09 | Disposition: A | Discharge status: Home / Self Care | Payer: 59 | Source: Ambulatory Visit | Attending: Hematology and Oncology | Admitting: Hematology and Oncology

## 2018-01-09 ENCOUNTER — Other Ambulatory Visit: Payer: Self-pay | Admitting: Hematology and Oncology

## 2018-01-09 DIAGNOSIS — R921 Mammographic calcification found on diagnostic imaging of breast: Secondary | ICD-10-CM

## 2018-01-09 DIAGNOSIS — Z9889 Other specified postprocedural states: Secondary | ICD-10-CM

## 2018-01-09 HISTORY — DX: Personal history of antineoplastic chemotherapy: Z92.21

## 2018-01-09 HISTORY — DX: Personal history of irradiation: Z92.3

## 2018-01-12 ENCOUNTER — Encounter: Payer: Self-pay | Admitting: Hematology and Oncology

## 2018-01-12 ENCOUNTER — Other Ambulatory Visit: Payer: Self-pay | Admitting: Family Medicine

## 2018-01-12 ENCOUNTER — Telehealth (INDEPENDENT_AMBULATORY_CARE_PROVIDER_SITE_OTHER): Payer: Self-pay

## 2018-01-12 ENCOUNTER — Encounter: Payer: Self-pay | Admitting: Family Medicine

## 2018-01-12 ENCOUNTER — Other Ambulatory Visit: Payer: Self-pay | Admitting: Hematology and Oncology

## 2018-01-12 DIAGNOSIS — N6320 Unspecified lump in the left breast, unspecified quadrant: Secondary | ICD-10-CM

## 2018-01-12 DIAGNOSIS — R921 Mammographic calcification found on diagnostic imaging of breast: Secondary | ICD-10-CM

## 2018-01-12 NOTE — Telephone Encounter (Signed)
Still having issues trying to get benefits verified for monovisc injection for patient. I have called her and left another message for her to call me back. She did bring by new/updated insurance card, however, the back of the card was not scanned. I have LM for her to please call me with some information off of the back of the card, Need provider phone number and the carrier listed on the card.

## 2018-01-15 ENCOUNTER — Telehealth: Payer: Self-pay | Admitting: Family Medicine

## 2018-01-15 ENCOUNTER — Telehealth: Payer: Self-pay

## 2018-01-15 NOTE — Telephone Encounter (Signed)
I called and spoke to Reyno @ [facility], to inform them that I have contacted pt insurance company and authorization is needed for this procedure. Per insurance authorization is in clinical review and office notes faxed to assist with getting authorization. I was informed that decision is made within 3-5 days. I will notify [facility] by Monday afternoon if authorization is not received. I have called 2 different times and have left message on patient voicemail to return my call, so I can go over information with her. I have asked [facility] that if she calls them to refer her to me. I have additional information to provide to patient.

## 2018-01-15 NOTE — Telephone Encounter (Signed)
Monica-Please see below/something about an authorization number for the left breast Bx/plz advise/thx dmf  Copied from [place] [phone]. Topic: Referral - Request >> Jan 14, 2018  2:15 PM Conception Chancy, NT wrote: Reason for CRM: pt is calling and she has the focus plan and she is scheduled on Tuesday 01/20/18 for a left breast biopsy. The [facility] is needing a authorization number prior to the appt. Please advise.  Patient states she has been communicating with Dr. Deborra Medina per [place].   Phone Number [phone] - Focus Plan number Member ID number [id number]  [place] [phone]

## 2018-01-16 NOTE — Telephone Encounter (Signed)
Patient returned my call. Informed patient insurance  approved procedure and [facility] notified by phone and fax. Also spoke with patient about new insurance process for the Focus plan. Patient informed that this order should have been ordered by Dr Lindi Adie - oncologist. There office would be responsible for the authorization. Patient informed that we did not want to delay her care in having this procedure done, so we assisted with making sure that she was taken care of.  Patient stated she is learning about this insurance as well. Patient thanked me for the information.

## 2018-01-16 NOTE — Telephone Encounter (Signed)
[employer] faxed note that procedure for Bx breast has been approved/thx dmf

## 2018-01-16 NOTE — Telephone Encounter (Signed)
Thank you all for all you did for this patient!

## 2018-01-16 NOTE — Telephone Encounter (Signed)
Patient returned my call. Informed patient insurance  approved procedure and [facility] notified by phone and fax. Also spoke with patient about new insurance process for the Focus plan. Patient informed that this order should have been ordered by Dr Lindi Adie - oncologist. There office would be responsible for the authorization. Patient informed that we did not want to delay her care in having this procedure done, so we assisted with making sure that she was taken care of.  Patient stated she is learning about this insurance as well. Patient thanked me for the information.

## 2018-01-19 ENCOUNTER — Ambulatory Visit (INDEPENDENT_AMBULATORY_CARE_PROVIDER_SITE_OTHER): Payer: No Typology Code available for payment source | Admitting: Family Medicine

## 2018-01-19 VITALS — BP 130/64 | HR 82 | Temp 97.8°F | Ht 67.5 in | Wt 202.6 lb

## 2018-01-19 DIAGNOSIS — Z0000 Encounter for general adult medical examination without abnormal findings: Secondary | ICD-10-CM | POA: Diagnosis not present

## 2018-01-19 DIAGNOSIS — Z170 Estrogen receptor positive status [ER+]: Secondary | ICD-10-CM

## 2018-01-19 DIAGNOSIS — C50212 Malignant neoplasm of upper-inner quadrant of left female breast: Secondary | ICD-10-CM

## 2018-01-19 DIAGNOSIS — E1165 Type 2 diabetes mellitus with hyperglycemia: Secondary | ICD-10-CM

## 2018-01-19 DIAGNOSIS — E785 Hyperlipidemia, unspecified: Secondary | ICD-10-CM | POA: Diagnosis not present

## 2018-01-19 DIAGNOSIS — F4320 Adjustment disorder, unspecified: Secondary | ICD-10-CM | POA: Insufficient documentation

## 2018-01-19 MED ORDER — MELOXICAM 7.5 MG PO TABS: 7.5000 mg | ORAL_TABLET | Freq: Every day | ORAL | 5 refills | Status: DC

## 2018-01-19 NOTE — Assessment & Plan Note (Signed)
Followed by endo. No changes made today. 

## 2018-01-19 NOTE — Progress Notes (Signed)
Subjective:   Patient ID: Veronica Rogers, female    DOB: [birth year], 56 y.o.   MRN: [id number]  Veronica Rogers is a pleasant 57 y.o. year old female who presents to clinic today with Annual Exam (Patient is here today for a CPE without PAP.  She is not fasting but she did not eat lunch.  Dr. Cruzita Lederer takes care of her DM meds.)  on 01/19/2018  HPI:   Health Maintenance  Topic Date Due  . Hepatitis C Screening  1961-11-03  . PNEUMOCOCCAL POLYSACCHARIDE VACCINE (1) 09/22/1962  . HIV Screening  09/23/1975  . URINE MICROALBUMIN  11/23/2015  . FOOT EXAM  07/25/2017  . OPHTHALMOLOGY EXAM  04/07/2018  . HEMOGLOBIN A1C  05/28/2018  . PAP SMEAR  10/29/2019  . MAMMOGRAM  01/10/2020  . COLONOSCOPY  05/30/2021  . TETANUS/TDAP  10/28/2027  . INFLUENZA VACCINE  Completed  Has GYN- Dr. Stann Mainland.  Breast CA- followed by Dr. Lindi Adie. Last seen on 07/22/17. Note reviewed. On tamoxifen, zoloft for hot flashes.  Mammogram on 01/09/18 showed some scattered calcifications at lumpectomy site, felt to likely be benign and advised follow up in 6 months. Jing prefers to have biopsy.  This is scheduled for tomorrow.  DM- followed by Dr. Cruzita Lederer. Last seen on 11/28/17.  Note reviewed.  Lab Results  Component Value Date   HGBA1C 6.9 11/28/2017   Lab Results  Component Value Date   CHOL 223 (H) 08/26/2017   HDL 33.90 (L) 08/26/2017   LDLCALC 63 02/27/2015   LDLDIRECT 83.0 08/26/2017   TRIG (H) 08/26/2017    703.0 Triglyceride is over 400; calculations on Lipids are invalid.   CHOLHDL 7 08/26/2017    She advised the following: Please continue:  - Metformin ER 1500 mg with dinner.  Move: - Januvia 100 mg before b'fast  Decrease: - Glipizide to 5 mg before dinner only   Please return in 3 months with your sugar log.  Current Outpatient Medications on File Prior to Visit  Medication Sig Dispense Refill  . ACCU-CHEK FASTCLIX LANCETS MISC UAD tid for glucose monitoring    .  fenofibrate (TRICOR) 145 MG tablet Take 1 tablet (145 mg total) by mouth daily. 90 tablet 3  . glipiZIDE (GLUCOTROL) 5 MG tablet TAKE 1 TABLET BY MOUTH 1x DAILY BEFORE MEALS 180 tablet 3  . glucose blood (ACCU-CHEK GUIDE) test strip UAD tid for glucose monitoring    . metFORMIN (GLUCOPHAGE-XR) 750 MG 24 hr tablet TAKE 2 TABLETS BY MOUTH DAILY AT BEDTIME 180 tablet 3  . sertraline (ZOLOFT) 50 MG tablet Take 50 mg by mouth every morning.     . sitaGLIPtin (JANUVIA) 100 MG tablet Take 1 tablet (100 mg total) by mouth daily. 90 tablet 3  . tamoxifen (NOLVADEX) 20 MG tablet TAKE 1 TABLET BY MOUTH ONCE DAILY 90 tablet 3  . Multiple Vitamin (MULTIVITAMIN) tablet Take 1 tablet by mouth daily.     No current facility-administered medications on file prior to visit.     Allergies  Allergen Reactions  . Erythromycin Other (See Comments)    Stomach pain  . Tape Rash    Past Medical History:  Diagnosis Date  . Anxiety   . Breast cancer ([place])   . Contact lens/glasses fitting    wears contacts or glasses  . Depression   . Diabetes ([place]) 11/22/2014  . Hyperlipidemia   . Personal history of chemotherapy   . Personal history of radiation therapy   . Radiation  03/28/14-05/06/14   Left Breast 60 Gy    Past Surgical History:  Procedure Laterality Date  . BREAST BIOPSY  1996   right breast, benign  . BREAST LUMPECTOMY WITH NEEDLE LOCALIZATION AND AXILLARY SENTINEL LYMPH NODE BX Left 10/29/2013   Procedure: BREAST LUMPECTOMY WITH NEEDLE LOCALIZATION AND AXILLARY SENTINEL LYMPH NODE BX;  Surgeon: Rolm Bookbinder, MD;  Location: [facility];  Service: General;  Laterality: Left;  . COLONOSCOPY  05/2011   WNL  . WISDOM TOOTH EXTRACTION      Family History  Problem Relation Age of Onset  . Squamous cell carcinoma Father   . Cancer Father   . Diabetes Father   . Hypertension Mother   . Diabetes Sister     Social History   Socioeconomic History  . Marital status: Married     Spouse name: Not on file  . Number of children: Not on file  . Years of education: Not on file  . Highest education level: Not on file  Social Needs  . Financial resource strain: Not on file  . Food insecurity - worry: Not on file  . Food insecurity - inability: Not on file  . Transportation needs - medical: Not on file  . Transportation needs - non-medical: Not on file  Occupational History  . Not on file  Tobacco Use  . Smoking status: Never Smoker  . Smokeless tobacco: Never Used  Substance and Sexual Activity  . Alcohol use: Yes    Comment: 1-2 drinks a month  . Drug use: No  . Sexual activity: Yes  Other Topics Concern  . Not on file  Social History Narrative  . Not on file   The PMH, PSH, Social History, Family History, Medications, and allergies have been reviewed in [facility], and have been updated if relevant.   Review of Systems  Constitutional: Negative.   HENT: Negative.   Eyes: Negative.   Respiratory: Negative.   Cardiovascular: Negative.   Gastrointestinal: Negative.   Endocrine: Negative.   Genitourinary: Negative.   Musculoskeletal: Negative.   Skin: Negative.   Allergic/Immunologic: Negative.   Neurological: Negative.   Hematological: Negative.   Psychiatric/Behavioral: Negative.   All other systems reviewed and are negative.      Objective:    BP 130/64 (BP Location: Right Arm, Patient Position: Sitting, Cuff Size: Normal)   Pulse 82   Temp 97.8 F (36.6 C) (Oral)   Ht 5' 7.5" (1.715 m)   Wt 202 lb 9.6 oz (91.9 kg)   LMP 11/08/2013   SpO2 95%   BMI 31.26 kg/m    Physical Exam    General:  Well-developed,well-nourished,in no acute distress; alert,appropriate and cooperative throughout examination Head:  normocephalic and atraumatic.   Eyes:  vision grossly intact, PERRL Ears:  R ear normal and L ear normal externally, TMs clear bilaterally Nose:  no external deformity.   Mouth:  good dentition.   Neck:  No deformities, masses, or  tenderness noted.  Lungs:  Normal respiratory effort, chest expands symmetrically. Lungs are clear to auscultation, no crackles or wheezes. Heart:  Normal rate and regular rhythm. S1 and S2 normal without gallop, murmur, click, rub or other extra sounds. Abdomen:  Bowel sounds positive,abdomen soft and non-tender without masses, organomegaly or hernias noted. Msk:  No deformity or scoliosis noted of thoracic or lumbar spine.   Extremities:  No clubbing, cyanosis, edema, or deformity noted with normal full range of motion of all joints.   Neurologic:  alert &  oriented X3 and gait normal.   Skin:  Intact without suspicious lesions or rashes Cervical Nodes:  No lymphadenopathy noted Axillary Nodes:  No palpable lymphadenopathy Psych:  Cognition and judgment appear intact. Alert and cooperative with normal attention span and concentration. No apparent delusions, illusions, hallucinations      Assessment & Plan:   Routine general medical examination at a health care facility  Malignant neoplasm of upper-inner quadrant of left breast in female, estrogen receptor positive ([place]) - Plan: CBC with Differential/Platelet, TSH  Hyperlipidemia, unspecified hyperlipidemia type - Plan: Comprehensive metabolic panel, Lipid panel  Type 2 diabetes mellitus with hyperglycemia, without long-term current use of insulin (HCC) No Follow-up on file.

## 2018-01-19 NOTE — Patient Instructions (Signed)
Great to see you. I will call you with your lab results from today and you can view them online.   

## 2018-01-19 NOTE — Assessment & Plan Note (Signed)
Followed by oncology. On tamoxifen, zoloft for hot flashes. Scheduled for breast biopsy tomorrow.

## 2018-01-19 NOTE — Assessment & Plan Note (Signed)
Reviewed preventive care protocols, scheduled due services, and updated immunizations Discussed nutrition, exercise, diet, and healthy lifestyle.  Orders Placed This Encounter  Procedures  . CBC with Differential/Platelet  . Comprehensive metabolic panel  . Lipid panel  . TSH     

## 2018-01-19 NOTE — Assessment & Plan Note (Signed)
Labs today

## 2018-01-20 ENCOUNTER — Ambulatory Visit
Admission: RE | Admit: 2018-01-20 | Discharge: 2018-01-20 | Disposition: A | Discharge status: Home / Self Care | Payer: No Typology Code available for payment source | Source: Ambulatory Visit | Attending: Hematology and Oncology | Admitting: Hematology and Oncology

## 2018-01-20 ENCOUNTER — Other Ambulatory Visit: Payer: Self-pay | Admitting: Hematology and Oncology

## 2018-01-20 DIAGNOSIS — R921 Mammographic calcification found on diagnostic imaging of breast: Secondary | ICD-10-CM

## 2018-01-20 LAB — COMPREHENSIVE METABOLIC PANEL
ALK PHOS: 33 U/L — AB (ref 39–117)
ALT: 15 U/L (ref 0–35)
AST: 19 U/L (ref 0–37)
Albumin: 4.5 g/dL (ref 3.5–5.2)
BILIRUBIN TOTAL: 0.5 mg/dL (ref 0.2–1.2)
BUN: 17 mg/dL (ref 6–23)
CALCIUM: 10.3 mg/dL (ref 8.4–10.5)
CO2: 28 mEq/L (ref 19–32)
Chloride: 102 mEq/L (ref 96–112)
Creatinine, Ser: 0.73 mg/dL (ref 0.40–1.20)
GFR: 87.24 mL/min (ref >60.00)
Glucose, Bld: 104 mg/dL — ABNORMAL HIGH (ref 70–99)
POTASSIUM: 4.6 meq/L (ref 3.5–5.1)
Sodium: 140 mEq/L (ref 135–145)
TOTAL PROTEIN: 7 g/dL (ref 6.0–8.3)

## 2018-01-20 LAB — CBC WITH DIFFERENTIAL/PLATELET
Basophils Absolute: 0.1 10*3/uL (ref 0.0–0.1)
Basophils Relative: 1.4 % (ref 0.0–3.0)
EOS PCT: 1.7 % (ref 0.0–5.0)
Eosinophils Absolute: 0.1 10*3/uL (ref 0.0–0.7)
HEMATOCRIT: 37.9 % (ref 36.0–46.0)
Hemoglobin: 12.3 g/dL (ref 12.0–15.0)
LYMPHS ABS: 3.4 10*3/uL (ref 0.7–4.0)
LYMPHS PCT: 43.9 % (ref 12.0–46.0)
MCHC: 32.4 g/dL (ref 30.0–36.0)
MCV: 82.4 fl (ref 78.0–100.0)
MONOS PCT: 5.5 % (ref 3.0–12.0)
Monocytes Absolute: 0.4 10*3/uL (ref 0.1–1.0)
NEUTROS ABS: 3.7 10*3/uL (ref 1.4–7.7)
Neutrophils Relative %: 47.5 % (ref 43.0–77.0)
PLATELETS: 223 10*3/uL (ref 150.0–400.0)
RBC: 4.6 Mil/uL (ref 3.87–5.11)
RDW: 14 % (ref 11.5–15.5)
WBC: 7.7 10*3/uL (ref 4.0–10.5)

## 2018-01-20 LAB — LIPID PANEL
Cholesterol: 194 mg/dL (ref 0–200)
HDL: 44.5 mg/dL (ref >39.00)
NonHDL: 149.45
TRIGLYCERIDES: 219 mg/dL — AB (ref 0.0–149.0)
Total CHOL/HDL Ratio: 4
VLDL: 43.8 mg/dL — AB (ref 0.0–40.0)

## 2018-01-20 LAB — LDL CHOLESTEROL, DIRECT: LDL DIRECT: 111 mg/dL

## 2018-01-20 LAB — TSH: TSH: 2.41 u[IU]/mL (ref 0.35–4.50)

## 2018-01-21 ENCOUNTER — Encounter: Payer: Self-pay | Admitting: Family Medicine

## 2018-03-05 MED FILL — glipiZIDE 5 MG TABS: 5 | 90 days supply | Qty: 180 | Fill #2

## 2018-03-05 MED FILL — FENOFIBRATE 145 MG TAB: 145 | 90 days supply | Qty: 90 | Fill #1

## 2018-03-05 MED FILL — JANUVIA 100 MG TABLET: 100 | 90 days supply | Qty: 90 | Fill #1

## 2018-03-05 MED FILL — TAMOXIFEN CITRATE 20 MG TAB: 20 | 90 days supply | Qty: 90 | Fill #3

## 2018-03-05 MED FILL — METFORMIN HCL ER 750 MG TAB: 750 | 90 days supply | Qty: 180 | Fill #1

## 2018-03-05 MED FILL — SERTRALINE HCL 50 MG TABLET: 50 | 90 days supply | Qty: 90 | Fill #3

## 2018-03-06 ENCOUNTER — Ambulatory Visit: Payer: 59 | Admitting: Internal Medicine

## 2018-03-09 ENCOUNTER — Telehealth (INDEPENDENT_AMBULATORY_CARE_PROVIDER_SITE_OTHER): Payer: Self-pay

## 2018-03-09 NOTE — Telephone Encounter (Signed)
Submitted application online for Monovisc for right knee.

## 2018-03-13 ENCOUNTER — Encounter: Payer: Self-pay | Admitting: Family Medicine

## 2018-03-13 NOTE — Telephone Encounter (Signed)
Would you like for referral to placed for Endocrinology? Please advise. Thank you.

## 2018-03-17 ENCOUNTER — Other Ambulatory Visit: Payer: Self-pay | Admitting: Emergency Medicine

## 2018-03-17 ENCOUNTER — Telehealth (INDEPENDENT_AMBULATORY_CARE_PROVIDER_SITE_OTHER): Payer: Self-pay

## 2018-03-17 DIAGNOSIS — E1165 Type 2 diabetes mellitus with hyperglycemia: Secondary | ICD-10-CM

## 2018-03-17 NOTE — Telephone Encounter (Signed)
Talked with patient and advised her that she would need to call [employer] at [phone] to verify coverage for Monovisc.  Advised her that [employer] does not provide coverage details to third party entities.   Patient understands and stated that she will call [employer].

## 2018-03-17 NOTE — Telephone Encounter (Signed)
Referral has been placed and patient has been notified through my chart.

## 2018-03-18 ENCOUNTER — Telehealth (INDEPENDENT_AMBULATORY_CARE_PROVIDER_SITE_OTHER): Payer: Self-pay

## 2018-03-18 NOTE — Telephone Encounter (Signed)
Talked with patient and she stated that I needed to call [employer] to provide information for Monovisc Inj., Called and spoke with [employer] and provided information for Monovisc and was advised that a PA is needed and office notes.  Initiated PA for Monovisc inj. Faxed Office notes to [employer] at [phone]  Patient is covered at 80% Patient responsible for 20%  Called and left VM advising patient to return my call.

## 2018-03-30 ENCOUNTER — Encounter: Payer: Self-pay | Admitting: Family Medicine

## 2018-03-30 ENCOUNTER — Ambulatory Visit (INDEPENDENT_AMBULATORY_CARE_PROVIDER_SITE_OTHER): Payer: No Typology Code available for payment source | Admitting: Family Medicine

## 2018-03-30 VITALS — BP 124/76 | HR 93 | Temp 98.5°F | Ht 67.5 in | Wt 201.4 lb

## 2018-03-30 DIAGNOSIS — R05 Cough: Secondary | ICD-10-CM | POA: Diagnosis not present

## 2018-03-30 DIAGNOSIS — J040 Acute laryngitis: Secondary | ICD-10-CM

## 2018-03-30 DIAGNOSIS — R059 Cough, unspecified: Secondary | ICD-10-CM

## 2018-03-30 MED ORDER — HYDROCODONE-HOMATROPINE 5-1.5 MG/5ML PO SYRP: 5.0000 mL | ORAL_SOLUTION | Freq: Three times a day (TID) | ORAL | 0 refills | Status: DC | PRN

## 2018-03-30 MED ORDER — PREDNISONE 10 MG PO TABS
10.0000 mg | ORAL_TABLET | Freq: Every day | ORAL | 0 refills | Status: AC
Start: 2018-03-30 — End: 2018-04-04

## 2018-03-30 MED FILL — HYDROCODONE-HOMATROPINE SYR: 5-1.5 | 8 days supply | Qty: 120 | Fill #0

## 2018-03-30 MED FILL — predniSONE 10 MG TABS: 10 | 5 days supply | Qty: 5 | Fill #0

## 2018-03-30 NOTE — Progress Notes (Signed)
Subjective:  Patient ID: Veronica Rogers, female    DOB: 1961/01/31  Age: 57 y.o. MRN: [id number]  CC: chest congestion (and cough, started Thursday, no fever.)   HPI Veronica Rogers presents for evaluation of a 3-day history of postnasal drip, laryngitis, cough.  She denies fevers or chills, sneezing, wheezing, or reactive airway disease.  She does not smoke.  Has no asthma history.  She is a breast cancer survivor.  History of diabetes that that is well controlled.  Outpatient Medications Prior to Visit  Medication Sig Dispense Refill  . ACCU-CHEK FASTCLIX LANCETS MISC UAD tid for glucose monitoring    . fenofibrate (TRICOR) 145 MG tablet Take 1 tablet (145 mg total) by mouth daily. 90 tablet 3  . glipiZIDE (GLUCOTROL) 5 MG tablet TAKE 1 TABLET BY MOUTH 1x DAILY BEFORE MEALS 180 tablet 3  . glucose blood (ACCU-CHEK GUIDE) test strip UAD tid for glucose monitoring    . meloxicam (MOBIC) 7.5 MG tablet Take 1 tablet (7.5 mg total) by mouth daily. 30 tablet 5  . metFORMIN (GLUCOPHAGE-XR) 750 MG 24 hr tablet TAKE 2 TABLETS BY MOUTH DAILY AT BEDTIME 180 tablet 3  . Multiple Vitamin (MULTIVITAMIN) tablet Take 1 tablet by mouth daily.    . sertraline (ZOLOFT) 50 MG tablet Take 50 mg by mouth every morning.     . sitaGLIPtin (JANUVIA) 100 MG tablet Take 1 tablet (100 mg total) by mouth daily. 90 tablet 3  . tamoxifen (NOLVADEX) 20 MG tablet TAKE 1 TABLET BY MOUTH ONCE DAILY 90 tablet 3   No facility-administered medications prior to visit.     ROS Review of Systems  Constitutional: Negative for chills and fever.  HENT: Positive for congestion, postnasal drip and voice change. Negative for sore throat.   Eyes: Negative for photophobia and visual disturbance.  Respiratory: Positive for cough. Negative for chest tightness and wheezing.   Cardiovascular: Negative.   Gastrointestinal: Negative.   Musculoskeletal: Negative for arthralgias and myalgias.  Skin: Negative for pallor and rash.    Allergic/Immunologic: Negative for immunocompromised state.  Neurological: Negative for weakness and headaches.  Hematological: Does not bruise/bleed easily.  Psychiatric/Behavioral: Negative.     Objective:  BP 124/76 (BP Location: Left Arm, Patient Position: Sitting, Cuff Size: Normal)   Pulse 93   Temp 98.5 F (36.9 C) (Oral)   Ht 5' 7.5" (1.715 m)   Wt 201 lb 6 oz (91.3 kg)   LMP 11/08/2013   SpO2 96%   BMI 31.07 kg/m   BP Readings from Last 3 Encounters:  03/30/18 124/76  01/19/18 130/64  11/28/17 130/80    Wt Readings from Last 3 Encounters:  03/30/18 201 lb 6 oz (91.3 kg)  01/19/18 202 lb 9.6 oz (91.9 kg)  11/28/17 203 lb 6.4 oz (92.3 kg)    Physical Exam  Constitutional: She is oriented to person, place, and time. She appears well-developed and well-nourished. No distress.  HENT:  Head: Normocephalic and atraumatic.  Right Ear: External ear normal.  Left Ear: External ear normal.  Mouth/Throat: Oropharynx is clear and moist. No oropharyngeal exudate.  Eyes: Pupils are equal, round, and reactive to light. Right eye exhibits no discharge. Left eye exhibits no discharge. No scleral icterus.  Neck: Neck supple. No JVD present. No tracheal deviation present. No thyromegaly present.  Cardiovascular: Normal rate, regular rhythm and normal heart sounds.  Pulmonary/Chest: Effort normal and breath sounds normal. No stridor. No respiratory distress. She has no wheezes. She has no  rales.  Abdominal: Soft. Bowel sounds are normal.  Lymphadenopathy:    She has no cervical adenopathy.  Neurological: She is alert and oriented to person, place, and time.  Skin: Skin is warm and dry. She is not diaphoretic.  Psychiatric: She has a normal mood and affect. Her behavior is normal.    Lab Results  Component Value Date   WBC 7.7 01/19/2018   HGB 12.3 01/19/2018   HCT 37.9 01/19/2018   PLT 223.0 01/19/2018   GLUCOSE 104 (H) 01/19/2018   CHOL 194 01/19/2018   TRIG 219.0 (H)  01/19/2018   HDL 44.50 01/19/2018   LDLDIRECT 111.0 01/19/2018   LDLCALC 63 02/27/2015   ALT 15 01/19/2018   AST 19 01/19/2018   NA 140 01/19/2018   K 4.6 01/19/2018   CL 102 01/19/2018   CREATININE 0.73 01/19/2018   BUN 17 01/19/2018   CO2 28 01/19/2018   TSH 2.41 01/19/2018   INR 0.92 03/08/2014   HGBA1C 6.9 11/28/2017   MICROALBUR 0.6 11/22/2014    Mm Clip Placement Left  Result Date: 01/20/2018 CLINICAL DATA:  Status post stereotactic core biopsy left breast calcifications EXAM: DIAGNOSTIC LEFT MAMMOGRAM POST STEREOTACTIC BIOPSY COMPARISON:  Previous exam(s). FINDINGS: Mammographic images were obtained following left breast stereotactic guided biopsy of calcifications at prior lumpectomy site. Cc and lateral views of the left breast demonstrate coil biopsy clip in the area of concern. IMPRESSION: Post biopsy mammogram as described. Final Assessment: Post Procedure Mammograms for Marker Placement Electronically Signed   By: Abelardo Diesel M.D.   On: 01/20/2018 09:03   Mm Lt Breast Bx W Loc Dev 1st Lesion Image Bx Spec Stereo Guide  Addendum Date: 01/21/2018   ADDENDUM REPORT: 01/21/2018 13:58 ADDENDUM: Pathology revealed DENSE STROMAL FIBROSIS WITH DYSTROPHIC CALCIFICATIONS, CONSISTENT WITH PRIOR PROCEDURE of the Left breast, upper outer quadrant. This was found to be concordant by Dr. Abelardo Diesel. Pathology results were discussed with the patient by telephone. The patient reported doing well after the biopsy with tenderness at the site. Post biopsy instructions and care were reviewed and questions were answered. The patient was encouraged to call [facility] for any additional concerns. The patient was instructed to return for annual diagnostic mammography and informed a reminder notice would be sent regarding this appointment. Pathology results reported by Terie Purser, RN on 01/21/2018. Electronically Signed   By: Abelardo Diesel M.D.   On: 01/21/2018 13:58    Result Date: 01/21/2018 CLINICAL DATA:  Patient status post lumpectomy in 2014. Calcifications in prior left breast lumpectomy site for biopsy. EXAM: LEFT BREAST STEREOTACTIC CORE NEEDLE BIOPSY COMPARISON:  Previous exams. FINDINGS: The patient and I discussed the procedure of stereotactic-guided biopsy including benefits and alternatives. We discussed the high likelihood of a successful procedure. We discussed the risks of the procedure including infection, bleeding, tissue injury, clip migration, and inadequate sampling. Informed written consent was given. The usual time out protocol was performed immediately prior to the procedure. Using sterile technique and 1% Lidocaine as local anesthetic, under stereotactic guidance, a 9 gauge vacuum assisted device was used to perform core needle biopsy of calcifications in the upper-outer quadrant left breast using a cranial approach. Specimen radiograph was performed showing inclusion of calcifications of concern. Specimens with calcifications are identified for pathology. Lesion quadrant: Upper-outer quadrant At the conclusion of the procedure, a coil tissue marker clip was deployed into the biopsy cavity. Follow-up 2-view mammogram was performed and dictated separately. IMPRESSION: Stereotactic-guided biopsy of  left breast. No apparent complications. Electronically Signed: By: Abelardo Diesel M.D. On: 01/20/2018 09:02    Assessment & Plan:   Veronica Rogers was seen today for chest congestion.  Diagnoses and all orders for this visit:  Laryngitis -     predniSONE (DELTASONE) 10 MG tablet; Take 1 tablet (10 mg total) by mouth daily with breakfast for 5 days.  Cough -     predniSONE (DELTASONE) 10 MG tablet; Take 1 tablet (10 mg total) by mouth daily with breakfast for 5 days. -     HYDROcodone-homatropine (HYCODAN) 5-1.5 MG/5ML syrup; Take 5 mLs by mouth every 8 (eight) hours as needed for cough.   I am having Veronica Rogers start on predniSONE and  HYDROcodone-homatropine. I am also having her maintain her sertraline, multivitamin, tamoxifen, sitaGLIPtin, metFORMIN, fenofibrate, glipiZIDE, ACCU-CHEK FASTCLIX LANCETS, glucose blood, and meloxicam.  Meds ordered this encounter  Medications  . predniSONE (DELTASONE) 10 MG tablet    Sig: Take 1 tablet (10 mg total) by mouth daily with breakfast for 5 days.    Dispense:  5 tablet    Refill:  0  . HYDROcodone-homatropine (HYCODAN) 5-1.5 MG/5ML syrup    Sig: Take 5 mLs by mouth every 8 (eight) hours as needed for cough.    Dispense:  120 mL    Refill:  0     Follow-up: Return in about 1 week (around 04/06/2018).  Libby Maw, MD

## 2018-04-02 ENCOUNTER — Other Ambulatory Visit: Payer: Self-pay

## 2018-04-02 ENCOUNTER — Encounter: Payer: Self-pay | Admitting: Family Medicine

## 2018-04-02 MED ORDER — AMOXICILLIN 500 MG PO CAPS: 500.0000 mg | ORAL_CAPSULE | Freq: Three times a day (TID) | ORAL | 0 refills | Status: AC

## 2018-04-02 MED ORDER — ALBUTEROL SULFATE HFA 108 (90 BASE) MCG/ACT IN AERS: 1.0000 | INHALATION_SPRAY | Freq: Four times a day (QID) | RESPIRATORY_TRACT | 1 refills | Status: DC | PRN

## 2018-04-02 MED FILL — AMOXICILLIN 500 MG CAPSULE: 500 | 10 days supply | Qty: 30 | Fill #0

## 2018-04-02 MED FILL — VENTOLIN HFA 90 MCG INHALER: 108 (90 BAS | 25 days supply | Qty: 18 | Fill #0

## 2018-04-10 LAB — HM DIABETES EYE EXAM

## 2018-04-15 ENCOUNTER — Encounter: Payer: Self-pay | Admitting: Family Medicine

## 2018-04-15 ENCOUNTER — Other Ambulatory Visit: Payer: Self-pay

## 2018-04-15 MED ORDER — ALBUTEROL SULFATE HFA 108 (90 BASE) MCG/ACT IN AERS: 1.0000 | INHALATION_SPRAY | Freq: Four times a day (QID) | RESPIRATORY_TRACT | 1 refills | Status: DC | PRN

## 2018-04-20 MED FILL — VENTOLIN HFA 90 MCG INHALER: 108 (90 BAS | 25 days supply | Qty: 18 | Fill #1

## 2018-04-24 ENCOUNTER — Ambulatory Visit (INDEPENDENT_AMBULATORY_CARE_PROVIDER_SITE_OTHER): Payer: No Typology Code available for payment source | Admitting: Internal Medicine

## 2018-04-24 ENCOUNTER — Encounter: Payer: Self-pay | Admitting: Internal Medicine

## 2018-04-24 VITALS — BP 144/78 | HR 92 | Resp 17 | Ht 67.52 in | Wt 201.6 lb

## 2018-04-24 DIAGNOSIS — E1165 Type 2 diabetes mellitus with hyperglycemia: Secondary | ICD-10-CM | POA: Diagnosis not present

## 2018-04-24 DIAGNOSIS — E785 Hyperlipidemia, unspecified: Secondary | ICD-10-CM

## 2018-04-24 LAB — POCT GLYCOSYLATED HEMOGLOBIN (HGB A1C): HEMOGLOBIN A1C: 7.4

## 2018-04-24 NOTE — Patient Instructions (Addendum)
Please continue:  - Metformin ER 1500 mg with dinner - Januvia 100 mg before breakfast - Glipizide 5 mg before dinner, but increase to 10 mg before a larger dinner  Please return in 3 months with your sugar log.

## 2018-04-24 NOTE — Progress Notes (Signed)
Patient ID: Veronica Rogers, female   DOB: 04-Oct-1961, 57 y.o.   MRN: [id number]   HPI: Veronica Rogers is a 57 y.o.-year-old female, returning for f/u for DM2, dx in 2015 (after steroids), non-insulin-dependent, uncontrolled, without long term complications. Last visit  5 months ago.  Last hemoglobin A1c was: Lab Results  Component Value Date   HGBA1C 6.9 11/28/2017   HGBA1C 7.2 08/26/2017   HGBA1C 7.1 05/19/2017   Pt is on a regimen of:  - Metformin ER 1500 mg with dinner - Januvia 100 mg before breakfast >> may forget - Glipizide 5 mg before dinner We stopped Glipizide 5 mg before dinner in 12/2016 She was on insulin during ChTx. She was on regular metformin >> GI upset (diarrhea). She has frequent urination.  Pt checks her sugars 0-2 times a day: - am:  104, 124-168, 173 >> 138-147 >> 102-163 - 2h after b'fast:  n/c >> 105, 112 >> n/c >> 93-148 - before lunch: 90-138 >> 156 >> 90-110 >> 98-169 - 2h after lunch: 176 >> n/c >> 109 >> n/c  - before dinner: 106 >> 113-147, 183 >> n/c >> 124-136, 192 (sick) - 2h after dinner:  113, 129 >> n/c >> 160s >> 107-211 - bedtime: n/c  - nighttime:  82-124 >> n/c >> 135 >> 110 >> 98 Lowest sugar was 90 >> 93,  she has hypoglycemia awareness at 70. Highest sugar was   235 (ate sweets) x1 >> 211.  Glucometer: True test >> AccuChek  Pt's meals are: - Breakfast: cereals or 2 eggs or skips - 10:30 am: PB crackers - Lunch: leftovers from home: sandwich; soup; meat + veggies; chinese; salad - Dinner: eats out most dinners! [employer] -twice a week off and on-bicycle and walking.  -No CKD, last BUN/creatinine:  Lab Results  Component Value Date   BUN 17 01/19/2018   CREATININE 0.73 01/19/2018   -+ HL; last set of lipids: Lab Results  Component Value Date   CHOL 194 01/19/2018   HDL 44.50 01/19/2018   LDLCALC 63 02/27/2015   LDLDIRECT 111.0 01/19/2018   TRIG 219.0 (H) 01/19/2018   CHOLHDL 4 01/19/2018  Started fenofibrate  after last visit.  She is not on a statin. - last eye exam was in 02/2017: No DR.  Dr. Katy Fitch. -Denies numbness and tingling in her feet. She has a history of plantar fasciitis.  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 On Tamoxifen for 5 years >> she will finish the course in 2020.  ROS: Constitutional: no weight gain/no weight loss, no fatigue, no subjective hyperthermia, no subjective hypothermia Eyes: no blurry vision, no xerophthalmia ENT: no sore throat, no nodules palpated in throat, no dysphagia, no odynophagia, no hoarseness Cardiovascular: no CP/no SOB/no palpitations/no leg swelling Respiratory: no cough/no SOB/no wheezing Gastrointestinal: no N/no V/no D/no C/no acid reflux Musculoskeletal: no muscle aches/no joint aches Skin: no rashes, no hair loss Neurological: no tremors/no numbness/no tingling/no dizziness  I reviewed pt's medications, allergies, PMH, social hx, family hx, and changes were documented in the history of present illness. Otherwise, unchanged from my initial visit note.   Past Medical History:  Diagnosis Date  . Anxiety   . Breast cancer (Veronica Rogers)   . Contact lens/glasses fitting    wears contacts or glasses  . Depression   . Diabetes ([place]) 11/22/2014  . Hyperlipidemia   . Personal history of chemotherapy   . Personal history of radiation therapy   . Radiation 03/28/14-05/06/14   Left Breast 60 Gy  Past Surgical History:  Procedure Laterality Date  . BREAST BIOPSY  1996   right breast, benign  . BREAST LUMPECTOMY WITH NEEDLE LOCALIZATION AND AXILLARY SENTINEL LYMPH NODE BX Left 10/29/2013   Procedure: BREAST LUMPECTOMY WITH NEEDLE LOCALIZATION AND AXILLARY SENTINEL LYMPH NODE BX;  Surgeon: Rolm Bookbinder, MD;  Location: [facility];  Service: General;  Laterality: Left;  . COLONOSCOPY  05/2011   WNL  . WISDOM TOOTH EXTRACTION     Social History   Social History  . Marital Status: Married    Spouse Name: N/A  . Number of Children: 2   Occupational  History  . [employer] - support rep   Social History Main Topics  . Smoking status: Never Smoker   . Smokeless tobacco: Never Used  . Alcohol Use: Yes     Comment: 1-2 drinks a month  . Drug Use: No   Current Outpatient Medications on File Prior to Visit  Medication Sig Dispense Refill  . ACCU-CHEK FASTCLIX LANCETS MISC UAD tid for glucose monitoring    . albuterol (PROVENTIL HFA;VENTOLIN HFA) 108 (90 Base) MCG/ACT inhaler Inhale 1-2 puffs into the lungs every 6 (six) hours as needed for wheezing or shortness of breath. 1 Inhaler 1  . fenofibrate (TRICOR) 145 MG tablet Take 1 tablet (145 mg total) by mouth daily. 90 tablet 3  . glipiZIDE (GLUCOTROL) 5 MG tablet TAKE 1 TABLET BY MOUTH 1x DAILY BEFORE MEALS 180 tablet 3  . glucose blood (ACCU-CHEK GUIDE) test strip UAD tid for glucose monitoring    . HYDROcodone-homatropine (HYCODAN) 5-1.5 MG/5ML syrup Take 5 mLs by mouth every 8 (eight) hours as needed for cough. 120 mL 0  . meloxicam (MOBIC) 7.5 MG tablet Take 1 tablet (7.5 mg total) by mouth daily. 30 tablet 5  . metFORMIN (GLUCOPHAGE-XR) 750 MG 24 hr tablet TAKE 2 TABLETS BY MOUTH DAILY AT BEDTIME 180 tablet 3  . Multiple Vitamin (MULTIVITAMIN) tablet Take 1 tablet by mouth daily.    . sertraline (ZOLOFT) 50 MG tablet Take 50 mg by mouth every morning.     . sitaGLIPtin (JANUVIA) 100 MG tablet Take 1 tablet (100 mg total) by mouth daily. 90 tablet 3  . tamoxifen (NOLVADEX) 20 MG tablet TAKE 1 TABLET BY MOUTH ONCE DAILY 90 tablet 3   No current facility-administered medications on file prior to visit.    Allergies  Allergen Reactions  . Erythromycin Other (See Comments)    Stomach pain  . Tape Rash   Family History  Problem Relation Age of Onset  . Squamous cell carcinoma Father   . Cancer Father   . Diabetes Father   . Hypertension Mother   . Diabetes Sister    PE: BP (!) 144/78   Pulse 92   Resp 17   Ht 5' 7.52" (1.715 m)   Wt 201 lb 9.6 oz (91.4  kg)   LMP 11/08/2013   SpO2 96%   BMI 31.09 kg/m  Body mass index is 31.09 kg/m. Wt Readings from Last 3 Encounters:  04/24/18 201 lb 9.6 oz (91.4 kg)  03/30/18 201 lb 6 oz (91.3 kg)  01/19/18 202 lb 9.6 oz (91.9 kg)   Constitutional: overweight, in NAD Eyes: PERRLA, EOMI, no exophthalmos ENT: moist mucous membranes, no thyromegaly, no cervical lymphadenopathy Cardiovascular: tachycardia, RR, No MRG Respiratory: CTA B Gastrointestinal: abdomen soft, NT, ND, BS+ Musculoskeletal: no deformities, strength intact in all 4 Skin: moist, warm, no rashes Neurological: no tremor  with outstretched hands, DTR normal in all 4  ASSESSMENT: 1. DM2, non-insulin-dependent, uncontrolled, without long term complications, but with hyperglycemia  2. HL  PLAN:  1. Patient with long-standing, uncontrolled diabetes, on metformin, DPP 4 inhibitor, and also sulfonylurea.  At last visit, she was taking Januvia sporadically, at lunch.  I strongly advised her to take it every day before breakfast.  She was taking glipizide twice a day and will reduce this to only before dinner since she occasionally had low blood sugars before lunch. - at this visit, she had a busy period in which her mother in law was sick >> she relaxed her diet and now eating out almost every night >> sugars are higher. She is determined to change her diet and started to eat lighter meals. Discussed about increasing Glipizide to 10 mg before dinner for larger meals. She refuses a GLP1 R agonist as it is injectable. - I suggested to:  Patient Instructions  Please continue:  - Metformin ER 1500 mg with dinner - Januvia 100 mg before breakfast - Glipizide 5 mg before dinner, but increase to 10 mg before a larger dinner  Please return in 3 months with your sugar log.   - today, HbA1c is 7.4% (higher) - continue checking sugars at different times of the day - check 1x a day, rotating checks - advised for yearly eye exams >> she is not  UTD - Return to clinic in 3 mo with sugar log    2. HL -Reviewed latest lipid panel from 12/2017: LDL above target.  TG continue to remain elevated.  I recommended fenofibrate at the last visit >> she started it -We again discussed about the need to improve her diet to include less saturated fat and concentrated sweets. - She continues off a statin. - Needs a ipid panel at next visit  Philemon Kingdom, MD PhD [facility] Endocrinology

## 2018-05-21 ENCOUNTER — Other Ambulatory Visit: Payer: Self-pay | Admitting: Hematology and Oncology

## 2018-05-21 ENCOUNTER — Encounter: Payer: Self-pay | Admitting: Hematology and Oncology

## 2018-05-21 DIAGNOSIS — C50212 Malignant neoplasm of upper-inner quadrant of left female breast: Secondary | ICD-10-CM

## 2018-05-21 MED FILL — SERTRALINE HCL 50 MG TABLET: 50 | 90 days supply | Qty: 90 | Fill #0

## 2018-05-22 MED FILL — TAMOXIFEN CITRATE 20 MG TAB: 20 | 90 days supply | Qty: 90 | Fill #0

## 2018-05-29 ENCOUNTER — Ambulatory Visit (INDEPENDENT_AMBULATORY_CARE_PROVIDER_SITE_OTHER): Payer: No Typology Code available for payment source | Admitting: Family Medicine

## 2018-05-29 ENCOUNTER — Encounter: Payer: Self-pay | Admitting: Family Medicine

## 2018-05-29 VITALS — BP 124/76 | HR 80 | Temp 97.7°F | Ht 67.52 in | Wt 199.1 lb

## 2018-05-29 DIAGNOSIS — J040 Acute laryngitis: Secondary | ICD-10-CM

## 2018-05-29 DIAGNOSIS — R05 Cough: Secondary | ICD-10-CM

## 2018-05-29 DIAGNOSIS — R059 Cough, unspecified: Secondary | ICD-10-CM

## 2018-05-29 MED ORDER — BENZONATATE 200 MG PO CAPS: 200.0000 mg | ORAL_CAPSULE | Freq: Two times a day (BID) | ORAL | 0 refills | Status: DC | PRN

## 2018-05-29 MED ORDER — HYDROCODONE-HOMATROPINE 5-1.5 MG/5ML PO SYRP: 5.0000 mL | ORAL_SOLUTION | Freq: Three times a day (TID) | ORAL | 0 refills | Status: DC | PRN

## 2018-05-29 MED FILL — BENZONATATE 200 MG CAPSULE: 200 | 10 days supply | Qty: 20 | Fill #0

## 2018-05-29 MED FILL — HYDROCODONE-HOMATROPINE SYR: 5-1.5 | 8 days supply | Qty: 120 | Fill #0

## 2018-05-29 NOTE — Progress Notes (Signed)
Subjective:  Patient ID: Veronica Rogers, female    DOB: [birth year]  Age: 57 y.o. MRN: [id number]  CC: Cough   HPI Veronica Rogers presents for evaluation of a 3-day history of nasal congestion postnasal drip and laryngitis.  She denies headache fever chills reactive airway disease or wheezing.  Denies facial pressure teeth pain purulent rhinorrhea or phlegm.  She is coughing throughout the day and into the evening.  She does not has no history of hypertension.  Veronica Rogers blood pressures have been under control.  Outpatient Medications Prior to Visit  Medication Sig Dispense Refill  . ACCU-CHEK FASTCLIX LANCETS MISC UAD tid for glucose monitoring    . albuterol (PROVENTIL HFA;VENTOLIN HFA) 108 (90 Base) MCG/ACT inhaler Inhale 1-2 puffs into the lungs every 6 (six) hours as needed for wheezing or shortness of breath. 1 Inhaler 1  . fenofibrate (TRICOR) 145 MG tablet Take 1 tablet (145 mg total) by mouth daily. 90 tablet 3  . glipiZIDE (GLUCOTROL) 5 MG tablet TAKE 1 TABLET BY MOUTH 1x DAILY BEFORE MEALS 180 tablet 3  . glucose blood (ACCU-CHEK GUIDE) test strip UAD tid for glucose monitoring    . meloxicam (MOBIC) 7.5 MG tablet Take 1 tablet (7.5 mg total) by mouth daily. 30 tablet 5  . metFORMIN (GLUCOPHAGE-XR) 750 MG 24 hr tablet TAKE 2 TABLETS BY MOUTH DAILY AT BEDTIME 180 tablet 3  . Multiple Vitamin (MULTIVITAMIN) tablet Take 1 tablet by mouth daily.    . sertraline (ZOLOFT) 50 MG tablet Take 50 mg by mouth every morning.     . sitaGLIPtin (JANUVIA) 100 MG tablet Take 1 tablet (100 mg total) by mouth daily. 90 tablet 3  . tamoxifen (NOLVADEX) 20 MG tablet TAKE 1 TABLET BY MOUTH ONCE DAILY 90 tablet 0  . HYDROcodone-homatropine (HYCODAN) 5-1.5 MG/5ML syrup Take 5 mLs by mouth every 8 (eight) hours as needed for cough. (Patient not taking: Reported on 04/24/2018) 120 mL 0   No facility-administered medications prior to visit.     ROS Review of Systems  Constitutional: Negative for chills,  fatigue and unexpected weight change.  HENT: Positive for hearing loss, postnasal drip, rhinorrhea, sore throat and voice change. Negative for sinus pressure, sinus pain and trouble swallowing.   Eyes: Negative for photophobia and visual disturbance.  Respiratory: Positive for cough. Negative for chest tightness and wheezing.   Cardiovascular: Negative.   Gastrointestinal: Negative.   Endocrine: Negative for polyphagia and polyuria.  Musculoskeletal: Negative for arthralgias and myalgias.  Skin: Negative for pallor and rash.  Allergic/Immunologic: Negative for immunocompromised state.  Neurological: Negative for weakness and headaches.  Hematological: Negative.   Psychiatric/Behavioral: Negative.     Objective:  BP 124/76   Pulse 80   Temp 97.7 F (36.5 C)   Ht 5' 7.52" (1.715 m)   Wt 199 lb 2 oz (90.3 kg)   LMP 11/08/2013   SpO2 96%   BMI 30.71 kg/m   BP Readings from Last 3 Encounters:  05/29/18 124/76  04/24/18 (!) 144/78  03/30/18 124/76    Wt Readings from Last 3 Encounters:  05/29/18 199 lb 2 oz (90.3 kg)  04/24/18 201 lb 9.6 oz (91.4 kg)  03/30/18 201 lb 6 oz (91.3 kg)    Physical Exam  Constitutional: She is oriented to person, place, and time. She appears well-developed and well-nourished. No distress.  HENT:  Head: Normocephalic and atraumatic.  Right Ear: External ear normal.  Left Ear: External ear normal.  Nose: Nose normal.  Mouth/Throat: Oropharynx is clear and moist.  Eyes: Pupils are equal, round, and reactive to light. Conjunctivae and EOM are normal. Right eye exhibits no discharge. Left eye exhibits no discharge. No scleral icterus.  Neck: Neck supple. No JVD present. No tracheal deviation present. No thyromegaly present.  Cardiovascular: Normal rate, regular rhythm and normal heart sounds.  Pulmonary/Chest: Effort normal and breath sounds normal.  Lymphadenopathy:    She has no cervical adenopathy.  Neurological: She is alert and oriented to  person, place, and time.  Skin: No rash noted. She is not diaphoretic. No erythema. No pallor.  Psychiatric: She has a normal mood and affect. Veronica Rogers behavior is normal.    Lab Results  Component Value Date   WBC 7.7 01/19/2018   HGB 12.3 01/19/2018   HCT 37.9 01/19/2018   PLT 223.0 01/19/2018   GLUCOSE 104 (H) 01/19/2018   CHOL 194 01/19/2018   TRIG 219.0 (H) 01/19/2018   HDL 44.50 01/19/2018   LDLDIRECT 111.0 01/19/2018   LDLCALC 63 02/27/2015   ALT 15 01/19/2018   AST 19 01/19/2018   NA 140 01/19/2018   K 4.6 01/19/2018   CL 102 01/19/2018   CREATININE 0.73 01/19/2018   BUN 17 01/19/2018   CO2 28 01/19/2018   TSH 2.41 01/19/2018   INR 0.92 03/08/2014   HGBA1C 7.4 04/24/2018   MICROALBUR 0.6 11/22/2014    Mm Clip Placement Left  Result Date: 01/20/2018 CLINICAL DATA:  Status post stereotactic core biopsy left breast calcifications EXAM: DIAGNOSTIC LEFT MAMMOGRAM POST STEREOTACTIC BIOPSY COMPARISON:  Previous exam(s). FINDINGS: Mammographic images were obtained following left breast stereotactic guided biopsy of calcifications at prior lumpectomy site. Cc and lateral views of the left breast demonstrate coil biopsy clip in the area of concern. IMPRESSION: Post biopsy mammogram as described. Final Assessment: Post Procedure Mammograms for Marker Placement Electronically Signed   By: Abelardo Diesel M.D.   On: 01/20/2018 09:03   Mm Lt Breast Bx W Loc Dev 1st Lesion Image Bx Spec Stereo Guide  Addendum Date: 01/21/2018   ADDENDUM REPORT: 01/21/2018 13:58 ADDENDUM: Pathology revealed DENSE STROMAL FIBROSIS WITH DYSTROPHIC CALCIFICATIONS, CONSISTENT WITH PRIOR PROCEDURE of the Left breast, upper outer quadrant. This was found to be concordant by Dr. Abelardo Diesel. Pathology results were discussed with the patient by telephone. The patient reported doing well after the biopsy with tenderness at the site. Post biopsy instructions and care were reviewed and questions were answered. The  patient was encouraged to call [facility] for any additional concerns. The patient was instructed to return for annual diagnostic mammography and informed a reminder notice would be sent regarding this appointment. Pathology results reported by Terie Purser, RN on 01/21/2018. Electronically Signed   By: Abelardo Diesel M.D.   On: 01/21/2018 13:58   Result Date: 01/21/2018 CLINICAL DATA:  Patient status post lumpectomy in 2014. Calcifications in prior left breast lumpectomy site for biopsy. EXAM: LEFT BREAST STEREOTACTIC CORE NEEDLE BIOPSY COMPARISON:  Previous exams. FINDINGS: The patient and I discussed the procedure of stereotactic-guided biopsy including benefits and alternatives. We discussed the high likelihood of a successful procedure. We discussed the risks of the procedure including infection, bleeding, tissue injury, clip migration, and inadequate sampling. Informed written consent was given. The usual time out protocol was performed immediately prior to the procedure. Using sterile technique and 1% Lidocaine as local anesthetic, under stereotactic guidance, a 9 gauge vacuum assisted device was used to perform core needle biopsy of calcifications  in the upper-outer quadrant left breast using a cranial approach. Specimen radiograph was performed showing inclusion of calcifications of concern. Specimens with calcifications are identified for pathology. Lesion quadrant: Upper-outer quadrant At the conclusion of the procedure, a coil tissue marker clip was deployed into the biopsy cavity. Follow-up 2-view mammogram was performed and dictated separately. IMPRESSION: Stereotactic-guided biopsy of left breast. No apparent complications. Electronically Signed: By: Abelardo Diesel M.D. On: 01/20/2018 09:02    Assessment & Plan:   Veronica Rogers was seen today for cough.  Diagnoses and all orders for this visit:  Laryngitis  Cough -     HYDROcodone-homatropine (HYCODAN) 5-1.5 MG/5ML  syrup; Take 5 mLs by mouth every 8 (eight) hours as needed for cough. -     benzonatate (TESSALON) 200 MG capsule; Take 1 capsule (200 mg total) by mouth 2 (two) times daily as needed for cough.   I am having Veronica Rogers start on benzonatate. I am also having Veronica Rogers maintain Veronica Rogers sertraline, multivitamin, sitaGLIPtin, metFORMIN, fenofibrate, glipiZIDE, ACCU-CHEK FASTCLIX LANCETS, glucose blood, meloxicam, albuterol, tamoxifen, and HYDROcodone-homatropine.  Meds ordered this encounter  Medications  . HYDROcodone-homatropine (HYCODAN) 5-1.5 MG/5ML syrup    Sig: Take 5 mLs by mouth every 8 (eight) hours as needed for cough.    Dispense:  120 mL    Refill:  0  . benzonatate (TESSALON) 200 MG capsule    Sig: Take 1 capsule (200 mg total) by mouth 2 (two) times daily as needed for cough.    Dispense:  20 capsule    Refill:  0     Follow-up: Return call tuesday if not better. Libby Maw, MD

## 2018-05-29 NOTE — Patient Instructions (Signed)
Laryngitis Laryngitis is inflammation of your vocal cords. This causes hoarseness, coughing, loss of voice, sore throat, or a dry throat. Your vocal cords are two bands of muscles that are found in your throat. When you speak, these cords come together and vibrate. These vibrations come out through your mouth as sound. When your vocal cords are inflamed, your voice sounds different. Laryngitis can be temporary (acute) or long-term (chronic). Most cases of acute laryngitis improve with time. Chronic laryngitis is laryngitis that lasts for more than three weeks. What are the causes? Acute laryngitis may be caused by:  A viral infection.  Lots of talking, yelling, or singing. This is also called vocal strain.  Bacterial infections.  Chronic laryngitis may be caused by:  Vocal strain.  Injury to your vocal cords.  Acid reflux (gastroesophageal reflux disease or GERD).  Allergies.  Sinus infection.  Smoking.  Alcohol abuse.  Breathing in chemicals or dust.  Growths on the vocal cords.  What increases the risk? Risk factors for laryngitis include:  Smoking.  Alcohol abuse.  Having allergies.  What are the signs or symptoms? Symptoms of laryngitis may include:  Low, hoarse voice.  Loss of voice.  Dry cough.  Sore throat.  Stuffy nose.  How is this diagnosed? Laryngitis may be diagnosed by:  Physical exam.  Throat culture.  Blood test.  Laryngoscopy. This procedure allows your health care provider to look at your vocal cords with a mirror or viewing tube.  How is this treated? Treatment for laryngitis depends on what is causing it. Usually, treatment involves resting your voice and using medicines to soothe your throat. However, if your laryngitis is caused by a bacterial infection, you may need to take antibiotic medicine. If your laryngitis is caused by a growth, you may need to have a procedure to remove it. Follow these instructions at home:  Drink  enough fluid to keep your urine clear or pale yellow.  Breathe in moist air. Use a humidifier if you live in a dry climate.  Take medicines only as directed by your health care provider.  If you were prescribed an antibiotic medicine, finish it all even if you start to feel better.  Do not smoke cigarettes or electronic cigarettes. If you need help quitting, ask your health care provider.  Talk as little as possible. Also avoid whispering, which can cause vocal strain.  Write instead of talking. Do this until your voice is back to normal. Contact a health care provider if:  You have a fever.  You have increasing pain.  You have difficulty swallowing. Get help right away if:  You cough up blood.  You have trouble breathing. This information is not intended to replace advice given to you by your health care provider. Make sure you discuss any questions you have with your health care provider. Document Released: 12/16/2005 Document Revised: 05/23/2016 Document Reviewed: 05/31/2014 Elsevier Interactive Patient Education  2018 [employer]  

## 2018-06-02 ENCOUNTER — Telehealth: Payer: Self-pay | Admitting: Hematology and Oncology

## 2018-06-02 NOTE — Telephone Encounter (Signed)
Spoke with patient.  ([facility]) Rescheduled appt from 7/24 to 8/9 at 11:30.  Patient needed a Friday AFTER 11:15.

## 2018-06-10 ENCOUNTER — Encounter: Payer: Self-pay | Admitting: Family Medicine

## 2018-06-10 DIAGNOSIS — J069 Acute upper respiratory infection, unspecified: Secondary | ICD-10-CM

## 2018-06-10 MED ORDER — AMOXICILLIN 500 MG PO CAPS: 500.0000 mg | ORAL_CAPSULE | Freq: Three times a day (TID) | ORAL | 0 refills | Status: DC

## 2018-06-10 MED FILL — AMOXICILLIN 500 MG CAPSULE: 500 | 10 days supply | Qty: 30 | Fill #0

## 2018-06-17 MED FILL — JANUVIA 100 MG TABLET: 100 | 90 days supply | Qty: 90 | Fill #2

## 2018-06-22 MED FILL — METFORMIN HCL ER 750 MG TAB: 750 | 90 days supply | Qty: 180 | Fill #2

## 2018-07-22 ENCOUNTER — Ambulatory Visit: Payer: 59 | Admitting: Hematology and Oncology

## 2018-07-31 MED FILL — MELOXICAM 7.5 MG TABLET: 7.5 | 30 days supply | Qty: 30 | Fill #0

## 2018-07-31 MED FILL — FENOFIBRATE 145 MG TAB: 145 | 90 days supply | Qty: 90 | Fill #2

## 2018-08-07 ENCOUNTER — Telehealth: Payer: Self-pay | Admitting: Hematology and Oncology

## 2018-08-07 ENCOUNTER — Inpatient Hospital Stay
Payer: No Typology Code available for payment source | Attending: Hematology and Oncology | Admitting: Hematology and Oncology

## 2018-08-07 DIAGNOSIS — Z79810 Long term (current) use of selective estrogen receptor modulators (SERMs): Secondary | ICD-10-CM

## 2018-08-07 DIAGNOSIS — Z79899 Other long term (current) drug therapy: Secondary | ICD-10-CM | POA: Diagnosis not present

## 2018-08-07 DIAGNOSIS — Z9221 Personal history of antineoplastic chemotherapy: Secondary | ICD-10-CM | POA: Diagnosis not present

## 2018-08-07 DIAGNOSIS — Z7984 Long term (current) use of oral hypoglycemic drugs: Secondary | ICD-10-CM | POA: Diagnosis not present

## 2018-08-07 DIAGNOSIS — Z170 Estrogen receptor positive status [ER+]: Secondary | ICD-10-CM

## 2018-08-07 DIAGNOSIS — Z923 Personal history of irradiation: Secondary | ICD-10-CM | POA: Diagnosis not present

## 2018-08-07 DIAGNOSIS — C50212 Malignant neoplasm of upper-inner quadrant of left female breast: Secondary | ICD-10-CM | POA: Diagnosis present

## 2018-08-07 DIAGNOSIS — R232 Flushing: Secondary | ICD-10-CM

## 2018-08-07 MED ORDER — TAMOXIFEN CITRATE 20 MG PO TABS: 20.0000 mg | ORAL_TABLET | Freq: Every day | ORAL | 3 refills | Status: DC

## 2018-08-07 NOTE — Progress Notes (Signed)
Patient Care Team: Lucille Passy, MD as PCP - General (Family Medicine)  DIAGNOSIS:  Encounter Diagnosis  Name Primary?  . 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[place])   10/29/2013 Surgery    left lumpectomy: 0.9 cm invasive ductal carcinoma, grade 2, ER/PR positive HER-2 negative, 1 sentinel node positive for micrometastatic, Oncotype DX recurrence score 29, 17% ROR    12/07/2013 - 02/08/2014 Chemotherapy    Taxotere and Cytoxan 4 adjuvant chemotherapy    03/30/2014 - 05/06/2014 Radiation Therapy    adjuvant radiation therapy    05/20/2014 -  Anti-estrogen oral therapy    tamoxifen 20 mg daily     CHIEF COMPLIANT: Follow-up on tamoxifen therapy  INTERVAL HISTORY: Veronica Rogers is a 35-year with above-mentioned history of left breast cancer treated with lumpectomy followed by adjuvant chemotherapy and radiation is currently on antiestrogen therapy with tamoxifen.  She appears to be tolerating it fairly well.  She does have occasional hot flashes.  Denies any arthralgias or myalgias.  REVIEW OF SYSTEMS:   Constitutional: Denies fevers, chills or abnormal weight loss Eyes: Denies blurriness of vision Ears, nose, mouth, throat, and face: Denies mucositis or sore throat Respiratory: Denies cough, dyspnea or wheezes Cardiovascular: Denies palpitation, chest discomfort Gastrointestinal:  Denies nausea, heartburn or change in bowel habits Skin: Denies abnormal skin rashes Lymphatics: Denies new lymphadenopathy or easy bruising Neurological:Denies numbness, tingling or new weaknesses Behavioral/Psych: Mood is stable, no new changes  Extremities: No lower extremity edema Breast:  denies any pain or lumps or nodules in either breasts All other systems were reviewed with the patient and are negative.  I have reviewed the past medical history, past surgical  history, social history and family history with the patient and they are unchanged from previous note.  ALLERGIES:  is allergic to erythromycin and tape.  MEDICATIONS:  Current Outpatient Medications  Medication Sig Dispense Refill  . ACCU-CHEK FASTCLIX LANCETS MISC UAD tid for glucose monitoring    . albuterol (PROVENTIL HFA;VENTOLIN HFA) 108 (90 Base) MCG/ACT inhaler Inhale 1-2 puffs into the lungs every 6 (six) hours as needed for wheezing or shortness of breath. 1 Inhaler 1  . fenofibrate (TRICOR) 145 MG tablet Take 1 tablet (145 mg total) by mouth daily. 90 tablet 3  . glipiZIDE (GLUCOTROL) 5 MG tablet TAKE 1 TABLET BY MOUTH 1x DAILY BEFORE MEALS 180 tablet 3  . glucose blood (ACCU-CHEK GUIDE) test strip UAD tid for glucose monitoring    . meloxicam (MOBIC) 7.5 MG tablet Take 1 tablet (7.5 mg total) by mouth daily. 30 tablet 5  . metFORMIN (GLUCOPHAGE-XR) 750 MG 24 hr tablet TAKE 2 TABLETS BY MOUTH DAILY AT BEDTIME 180 tablet 3  . Multiple Vitamin (MULTIVITAMIN) tablet Take 1 tablet by mouth daily.    . sertraline (ZOLOFT) 50 MG tablet Take 50 mg by mouth every morning.     . sitaGLIPtin (JANUVIA) 100 MG tablet Take 1 tablet (100 mg total) by mouth daily. 90 tablet 3  . tamoxifen (NOLVADEX) 20 MG tablet Take 1 tablet (20 mg total) by mouth daily. 90 tablet 3   No current facility-administered medications for this visit.     PHYSICAL EXAMINATION: ECOG PERFORMANCE STATUS: 0 - Asymptomatic  Vitals:   08/07/18 1129  BP: (!) 149/80  Pulse: 78  Resp: 18  Temp: 98.7 F (37.1 C)  SpO2:  96%   Filed Weights   08/07/18 1129  Weight: 202 lb 3.2 oz (91.7 kg)    GENERAL:alert, no distress and comfortable SKIN: skin color, texture, turgor are normal, no rashes or significant lesions EYES: normal, Conjunctiva are pink and non-injected, sclera clear OROPHARYNX:no exudate, no erythema and lips, buccal mucosa, and tongue normal  NECK: supple, thyroid normal size, non-tender, without  nodularity LYMPH:  no palpable lymphadenopathy in the cervical, axillary or inguinal LUNGS: clear to auscultation and percussion with normal breathing effort HEART: regular rate & rhythm and no murmurs and no lower extremity edema ABDOMEN:abdomen soft, non-tender and normal bowel sounds MUSCULOSKELETAL:no cyanosis of digits and no clubbing  NEURO: alert & oriented x 3 with fluent speech, no focal motor/sensory deficits EXTREMITIES: No lower extremity edema   LABORATORY DATA:  I have reviewed the data as listed CMP Latest Ref Rng & Units 01/19/2018 08/26/2017 07/24/2016  Glucose 70 - 99 mg/dL 104(H) 124(H) 111(H)  BUN 6 - 23 mg/dL '17 24 20  '$ Creatinine 0.40 - 1.20 mg/dL 0.73 0.90 0.67  Sodium 135 - 145 mEq/L 140 140 140  Potassium 3.5 - 5.1 mEq/L 4.6 4.0 4.0  Chloride 96 - 112 mEq/L 102 102 102  CO2 19 - 32 mEq/L '28 22 28  '$ Calcium 8.4 - 10.5 mg/dL 10.3 9.9 9.9  Total Protein 6.0 - 8.3 g/dL 7.0 6.7 7.1  Total Bilirubin 0.2 - 1.2 mg/dL 0.5 0.4 0.5  Alkaline Phos 39 - 117 U/L 33(L) 37 34(L)  AST 0 - 37 U/L '19 17 18  '$ ALT 0 - 35 U/L '15 16 19    '$ Lab Results  Component Value Date   WBC 7.7 01/19/2018   HGB 12.3 01/19/2018   HCT 37.9 01/19/2018   MCV 82.4 01/19/2018   PLT 223.0 01/19/2018   NEUTROABS 3.7 01/19/2018    ASSESSMENT & PLAN:  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 Occasional hot flashes We discussed optimal duration of tamoxifen therapy.  I recommended continuation of antiestrogen therapy for a total of 10 years.  Patient was concerned about long-term side effects of tamoxifen.  We  may elect to stop after 7.  We also discussed the role of breast cancer index in determining who needs extended adjuvant therapy.  At this point she is planning on staying on tamoxifen.   Breast cancer surveillance: 1. Mammogram done 01/09/2018 showed dystrophic calcifications biopsy was benign, breast density category B 2. Breast exam done by her gynecologist benign   Return to clinic in 1 year for follow-up    No orders of the defined types were placed in this encounter.  The patient has a good understanding of the overall plan. she agrees with it. she will call with any problems that may develop before the next visit here.   Harriette Ohara, MD 08/07/18

## 2018-08-07 NOTE — Telephone Encounter (Signed)
Per 8/8 los; ok to schedule per nurse.

## 2018-08-07 NOTE — Assessment & Plan Note (Signed)
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 Occasional hot flashes We discussed optimal duration of tamoxifen therapy.  I recommended continuation of antiestrogen therapy for a total of 10 years.  Patient was concerned about long-term side effects of tamoxifen.  We may elect to stop after 7.  We also discussed the role of breast cancer index in determining who needs extended adjuvant therapy.  At this point she is planning on staying on tamoxifen.   Breast cancer surveillance: 1. Mammogram done 01/09/2018 showed dystrophic calcifications biopsy was benign, breast density category B 2. Breast exam 08/07/2018: Benign   Return to clinic in 1 year for follow-up

## 2018-08-07 NOTE — Telephone Encounter (Signed)
Per 8/9 los - ok to schedule per May, RN., gave patient avs and calendar.

## 2018-08-12 MED FILL — SERTRALINE HCL 50 MG TABLET: 50 | 90 days supply | Qty: 90 | Fill #1

## 2018-08-12 MED FILL — VENTOLIN HFA 90 MCG INHALER: 108 (90 BAS | 25 days supply | Qty: 18 | Fill #0

## 2018-08-12 MED FILL — TAMOXIFEN CITRATE 20 MG TAB: 20 | 90 days supply | Qty: 90 | Fill #0

## 2018-09-03 ENCOUNTER — Encounter: Payer: Self-pay | Admitting: Internal Medicine

## 2018-09-03 ENCOUNTER — Ambulatory Visit (INDEPENDENT_AMBULATORY_CARE_PROVIDER_SITE_OTHER): Payer: No Typology Code available for payment source | Admitting: Internal Medicine

## 2018-09-03 VITALS — BP 144/82 | HR 80 | Resp 16 | Wt 198.0 lb

## 2018-09-03 DIAGNOSIS — E1165 Type 2 diabetes mellitus with hyperglycemia: Secondary | ICD-10-CM

## 2018-09-03 DIAGNOSIS — E785 Hyperlipidemia, unspecified: Secondary | ICD-10-CM

## 2018-09-03 LAB — POCT GLYCOSYLATED HEMOGLOBIN (HGB A1C): HEMOGLOBIN A1C: 7.2 % — AB (ref 4.0–5.6)

## 2018-09-03 LAB — LIPID PANEL
CHOL/HDL RATIO: 4
CHOLESTEROL: 174 mg/dL (ref 0–200)
HDL: 43.9 mg/dL (ref >39.00)
LDL Cholesterol: 96 mg/dL (ref 0–99)
NonHDL: 129.67
Triglycerides: 166 mg/dL — ABNORMAL HIGH (ref 0.0–149.0)
VLDL: 33.2 mg/dL (ref 0.0–40.0)

## 2018-09-03 NOTE — Patient Instructions (Addendum)
Please continue:  - Metformin ER 1500 mg with dinner - Januvia 100 mg before breakfast - Glipizide 5 mg before dinner but use 10 mg before a larger dinner  Please stop at the lab.  Please return in 3 months with your sugar log.

## 2018-09-03 NOTE — Progress Notes (Signed)
Patient ID: Veronica Rogers, female   DOB: 06-21-1961, 57 y.o.   MRN: [id number]   HPI: Veronica Rogers is a 57 y.o.-year-old female, returning for f/u for DM2, dx in 2015 (after steroids), non-insulin-dependent, uncontrolled, without long term complications. Last visit 4 months ago.  She changed her diet >> reduced portions, changed snacks, no fried food.   Last hemoglobin A1c was: Lab Results  Component Value Date   HGBA1C 7.4 04/24/2018   HGBA1C 6.9 11/28/2017   HGBA1C 7.2 08/26/2017   Pt is on a regimen of:  - Metformin ER 1500 mg with dinner - Januvia 100 mg before breakfast - Glipizide 5 mg before dinner but use 10 mg before a larger dinner We stopped Glipizide 5 mg before dinner in 12/2016 She was on insulin during ChTx. She was on regular metformin >> GI upset (diarrhea). She has frequent urination.  Pt checks her sugars 0-2 times a day: - am:  138-147 >> 102-163 >> 127-137 - 2h after b'fast: n/c >> 93-148 >> n/c >> 95-101 - before lunch:156 >> 90-110 >> 98-169 >> n/c - 2h after lunch: n/c >> 109 >> n/c >> 140-152 - before dinner: n/c >> 124-136, 192 >>  - 2h after dinner: n/c >> 160s >> 107-211 >> 152 - bedtime: n/c  - nighttime: n/c >> 135 >> 110 >> 98 >> 95-99 Lowest sugar was 93,  she has hypoglycemia awareness in the 70s. Highest sugar was    211.  Glucometer: True test >> AccuChek  Pt's meals are: - Breakfast: cereals or 2 eggs or skips - 10:30 am: PB crackers - Lunch: leftovers from home: sandwich; soup; meat + veggies; chinese; salad - Dinner: eats out most dinners! She was going to [employer] twice a week: Bicycle and walking >> busy now, did not go recently  -No CKD, last BUN/creatinine:  Lab Results  Component Value Date   BUN 17 01/19/2018   CREATININE 0.73 01/19/2018   -+ HL; last set of lipids: Lab Results  Component Value Date   CHOL 194 01/19/2018   HDL 44.50 01/19/2018   LDLCALC 63 02/27/2015   LDLDIRECT 111.0 01/19/2018   TRIG  219.0 (H) 01/19/2018   CHOLHDL 4 01/19/2018  She continues on fenofibrate.  Not on a statin. - last eye exam was in 02/2018: No DR.  Dr. Katy Fitch. -Denies numbness and tingling in her feet.  Has a history of plantar fasciitis  H/o BrCA 2014-2015. On Tamoxifen  thisfor 5 years >> she will finish the course in 2020.  ROS: Constitutional: + Slight weight loss, no fatigue, no subjective hyperthermia, no subjective hypothermia Eyes: no blurry vision, no xerophthalmia ENT: no sore throat, no nodules palpated in throat, no dysphagia, no odynophagia, no hoarseness Cardiovascular: no CP/no SOB/no palpitations/no leg swelling Respiratory: no cough/no SOB/no wheezing Gastrointestinal: no N/no V/no D/no C/no acid reflux Musculoskeletal: no muscle aches/no joint aches Skin: no rashes, no hair loss Neurological: no tremors/no numbness/no tingling/no dizziness  I reviewed pt's medications, allergies, PMH, social hx, family hx, and changes were documented in the history of present illness. Otherwise, unchanged from my initial visit note.   Past Medical History:  Diagnosis Date  . Anxiety   . Breast cancer ([place])   . Contact lens/glasses fitting    wears contacts or glasses  . Depression   . Diabetes ([place]) 11/22/2014  . Hyperlipidemia   . Personal history of chemotherapy   . Personal history of radiation therapy   . Radiation 03/28/14-05/06/14  Left Breast 60 Gy   Past Surgical History:  Procedure Laterality Date  . BREAST BIOPSY  1996   right breast, benign  . BREAST LUMPECTOMY WITH NEEDLE LOCALIZATION AND AXILLARY SENTINEL LYMPH NODE BX Left 10/29/2013   Procedure: BREAST LUMPECTOMY WITH NEEDLE LOCALIZATION AND AXILLARY SENTINEL LYMPH NODE BX;  Surgeon: Rolm Bookbinder, MD;  Location: [facility];  Service: General;  Laterality: Left;  . COLONOSCOPY  05/2011   WNL  . WISDOM TOOTH EXTRACTION     Social History   Social History  . Marital Status: Married    Spouse Name: N/A   . Number of Children: 2   Occupational History  . [employer] - support rep   Social History Main Topics  . Smoking status: Never Smoker   . Smokeless tobacco: Never Used  . Alcohol Use: Yes     Comment: 1-2 drinks a month  . Drug Use: No   Current Outpatient Medications on File Prior to Visit  Medication Sig Dispense Refill  . ACCU-CHEK FASTCLIX LANCETS MISC UAD tid for glucose monitoring    . albuterol (PROVENTIL HFA;VENTOLIN HFA) 108 (90 Base) MCG/ACT inhaler Inhale 1-2 puffs into the lungs every 6 (six) hours as needed for wheezing or shortness of breath. 1 Inhaler 1  . fenofibrate (TRICOR) 145 MG tablet Take 1 tablet (145 mg total) by mouth daily. 90 tablet 3  . glipiZIDE (GLUCOTROL) 5 MG tablet TAKE 1 TABLET BY MOUTH 1x DAILY BEFORE MEALS 180 tablet 3  . glucose blood (ACCU-CHEK GUIDE) test strip UAD tid for glucose monitoring    . meloxicam (MOBIC) 7.5 MG tablet Take 1 tablet (7.5 mg total) by mouth daily. 30 tablet 5  . metFORMIN (GLUCOPHAGE-XR) 750 MG 24 hr tablet TAKE 2 TABLETS BY MOUTH DAILY AT BEDTIME 180 tablet 3  . Multiple Vitamin (MULTIVITAMIN) tablet Take 1 tablet by mouth daily.    . sertraline (ZOLOFT) 50 MG tablet Take 50 mg by mouth every morning.     . sitaGLIPtin (JANUVIA) 100 MG tablet Take 1 tablet (100 mg total) by mouth daily. 90 tablet 3  . tamoxifen (NOLVADEX) 20 MG tablet Take 1 tablet (20 mg total) by mouth daily. 90 tablet 3   No current facility-administered medications on file prior to visit.    Allergies  Allergen Reactions  . Erythromycin Other (See Comments)    Stomach pain  . Tape Rash   Family History  Problem Relation Age of Onset  . Squamous cell carcinoma Father   . Cancer Father   . Diabetes Father   . Hypertension Mother   . Diabetes Sister    PE: BP (!) 144/82   Pulse 80   Resp 16   Wt 198 lb (89.8 kg)   LMP 11/08/2013   SpO2 98%   BMI 30.54 kg/m  Body mass index is 30.54 kg/m. Wt Readings from Last 3  Encounters:  09/03/18 198 lb (89.8 kg)  08/07/18 202 lb 3.2 oz (91.7 kg)  05/29/18 199 lb 2 oz (90.3 kg)   Constitutional: overweight, in NAD Eyes: PERRLA, EOMI, no exophthalmos ENT: moist mucous membranes, no thyromegaly, no cervical lymphadenopathy Cardiovascular: RRR, No MRG Respiratory: CTA B Gastrointestinal: abdomen soft, NT, ND, BS+ Musculoskeletal: no deformities, strength intact in all 4 Skin: moist, warm, no rashes Neurological: no tremor with outstretched hands, DTR normal in all 4  ASSESSMENT: 1. DM2, non-insulin-dependent, uncontrolled, without long term complications, but with hyperglycemia  2. HL  3.  Obesity  PLAN:  1. Patient with long-standing, uncontrolled, type 2 diabetes, on metformin, DPP 4 inhibitor, and also sulfonylurea.  She was initially on sulfonylurea twice a day, but we reduce it to only before dinner, since she had mild lows during the day.  At last visit, sugars are higher as she relaxed her diet and eating out a lot.  She was determined to change her diet and started to eat lighter meals.  At that time I advised her to increase the glipizide to 10 mg before dinner for larger meals.  She again refused a GLP-1 receptor agonist or any injectables. - At this visit, sugars have improved after improvement in diet.  We will not change her regimen for now but I strongly encouraged her to continue with a change in diet.  She also plans to restart going to the gym. - I suggested to:  Patient Instructions  Please continue:  - Metformin ER 1500 mg with dinner - Januvia 100 mg before breakfast - Glipizide 5 mg before dinner but use 10 mg before a larger dinner  Please stop at the lab.  Please return in 3-4 months with your sugar log.   - today, HbA1c is 7.2% (slightly improved) - continue checking sugars at different times of the day - check 1x a day, rotating checks - advised for yearly eye exams >> she is not UTD - Return to clinic in 3-4 mo with sugar  log     2. HL - Reviewed latest lipid panel from 12/2017: LDL above target, worse.  Triglycerides high.  We started fenofibrate after the labs return.  We also discussed about starting a diet with less concentrated sweets and less saturated fat. Lab Results  Component Value Date   CHOL 194 01/19/2018   HDL 44.50 01/19/2018   LDLCALC 63 02/27/2015   LDLDIRECT 111.0 01/19/2018   TRIG 219.0 (H) 01/19/2018   CHOLHDL 4 01/19/2018  - Continues fenofibrate without side effects.  Not on a statin. - We will recheck her lipid panel now  3.  Obesity -Patient made positive changes in her diet and she lost few pounds.  I strongly encouraged her to continue with a diet and she also plans to start going to the gym.  I am hoping that we can reduce and even stop glipizide (which can cause some weight gain) at next visit if she continues to improve.  Office Visit on 09/03/2018  Component Date Value Ref Range Status  . Hemoglobin A1C 09/03/2018 7.2* 4.0 - 5.6 % Final  . Cholesterol 09/03/2018 174  0 - 200 mg/dL Final   ATP III Classification       Desirable:  < 200 mg/dL               Borderline High:  200 - 239 mg/dL          High:  > = 240 mg/dL  . Triglycerides 09/03/2018 166.0* 0.0 - 149.0 mg/dL Final   Normal:  <150 mg/dLBorderline High:  150 - 199 mg/dL  . HDL 09/03/2018 43.90  >39.00 mg/dL Final  . VLDL 09/03/2018 33.2  0.0 - 40.0 mg/dL Final  . LDL Cholesterol 09/03/2018 96  0 - 99 mg/dL Final  . Total CHOL/HDL Ratio 09/03/2018 4   Final                  Men          Women1/2 Average Risk     3.4  3.3Average Risk          5.0          4.42X Average Risk          9.6          7.13X Average Risk          15.0          11.0                      . NonHDL 09/03/2018 129.67   Final   NOTE:  Non-HDL goal should be 30 mg/dL higher than patient's LDL goal (i.e. LDL goal of < 70 mg/dL, would have non-HDL goal of < 100 mg/dL)   Lipid panel is looking much better!  Philemon Kingdom, MD  PhD [facility] Endocrinology

## 2018-09-16 ENCOUNTER — Other Ambulatory Visit: Payer: Self-pay | Admitting: Internal Medicine

## 2018-09-16 DIAGNOSIS — E1165 Type 2 diabetes mellitus with hyperglycemia: Secondary | ICD-10-CM

## 2018-09-16 MED FILL — glipiZIDE 5 MG TABS: 5 | 90 days supply | Qty: 180 | Fill #0

## 2018-09-16 MED FILL — MELOXICAM 7.5 MG TABLET: 7.5 | 30 days supply | Qty: 30 | Fill #1

## 2018-09-16 MED FILL — JANUVIA 100 MG TABLET: 100 | 90 days supply | Qty: 90 | Fill #0

## 2018-09-16 MED FILL — METFORMIN HCL ER 750 MG TAB: 750 | 90 days supply | Qty: 180 | Fill #0

## 2018-10-27 MED FILL — SERTRALINE HCL 50 MG TABLET: 50 | 90 days supply | Qty: 90 | Fill #2

## 2018-10-27 MED FILL — TAMOXIFEN CITRATE 20 MG TAB: 20 | 90 days supply | Qty: 90 | Fill #1

## 2018-10-27 MED FILL — MELOXICAM 7.5 MG TABLET: 7.5 | 30 days supply | Qty: 30 | Fill #2

## 2018-11-09 ENCOUNTER — Other Ambulatory Visit: Payer: Self-pay | Admitting: Hematology and Oncology

## 2018-11-09 DIAGNOSIS — Z853 Personal history of malignant neoplasm of breast: Secondary | ICD-10-CM

## 2018-11-11 ENCOUNTER — Ambulatory Visit (INDEPENDENT_AMBULATORY_CARE_PROVIDER_SITE_OTHER): Payer: No Typology Code available for payment source | Admitting: Family Medicine

## 2018-11-11 VITALS — BP 162/90 | HR 87

## 2018-11-11 DIAGNOSIS — M62830 Muscle spasm of back: Secondary | ICD-10-CM

## 2018-11-11 DIAGNOSIS — G8929 Other chronic pain: Secondary | ICD-10-CM | POA: Diagnosis not present

## 2018-11-11 DIAGNOSIS — M545 Low back pain, unspecified: Secondary | ICD-10-CM

## 2018-11-11 DIAGNOSIS — M1710 Unilateral primary osteoarthritis, unspecified knee: Secondary | ICD-10-CM | POA: Insufficient documentation

## 2018-11-11 DIAGNOSIS — M25561 Pain in right knee: Secondary | ICD-10-CM | POA: Diagnosis not present

## 2018-11-11 DIAGNOSIS — M179 Osteoarthritis of knee, unspecified: Secondary | ICD-10-CM | POA: Insufficient documentation

## 2018-11-11 MED ORDER — CYCLOBENZAPRINE HCL 5 MG PO TABS: 5.0000 mg | ORAL_TABLET | Freq: Three times a day (TID) | ORAL | 0 refills | Status: DC | PRN

## 2018-11-11 MED ORDER — KETOROLAC TROMETHAMINE 60 MG/2ML IM SOLN
60.0000 mg | Freq: Once | INTRAMUSCULAR | Status: AC
  Administered 2018-11-11: 60 mg via INTRAMUSCULAR

## 2018-11-11 MED ORDER — IBUPROFEN-FAMOTIDINE 800-26.6 MG PO TABS: 1.0000 | ORAL_TABLET | Freq: Three times a day (TID) | ORAL | 3 refills | Status: DC

## 2018-11-11 MED FILL — CYCLOBENZAPRINE 5 MG TABLET: 5 | 10 days supply | Qty: 30 | Fill #0

## 2018-11-11 NOTE — Progress Notes (Signed)
Veronica Rogers - 57 y.o. female MRN [id number]  Date of birth: 1961-12-05  SUBJECTIVE:  Including CC & ROS.  Chief Complaint  Patient presents with  . Back spasm    started 7:01 this morning.     Veronica Rogers is a 57 y.o. female that is presenting with acute low back pain and right knee pain.  She was getting into work this morning and had a sudden intense pain.  The pain is acute radiating across her back.  Denies any sciatic symptoms.  She is unable to stand up or walk normally.  The pain is intense.  She has never experienced something this intense before.  She has not taken any medicines yet.  She denies any numbness or tingling.  She has been using a rolling walker to help with walking.  Has a history of right knee pain.  The pain is occurring on the lateral joint line.  Denies any locking or giving way.  Has received injections in the past.  Pain is worse with walking.  She seems to have pain and is localized to this area.  Independent review of the right knee x-ray from 2018 shows moderate to severe lateral joint line narrowing.   Review of Systems  Constitutional: Negative for fever.  HENT: Negative for congestion.   Cardiovascular: Negative for chest pain.  Gastrointestinal: Negative for abdominal pain.  Musculoskeletal: Positive for back pain, gait problem and joint swelling.  Skin: Negative for color change.  Neurological: Negative for weakness.  Hematological: Negative for adenopathy.  Psychiatric/Behavioral: Negative for agitation.    HISTORY: Past Medical, Surgical, Social, and Family History Reviewed & Updated per EMR.   Pertinent Historical Findings include:  Past Medical History:  Diagnosis Date  . Anxiety   . Breast cancer ([place])   . Contact lens/glasses fitting    wears contacts or glasses  . Depression   . Diabetes ([place]) 11/22/2014  . Hyperlipidemia   . Personal history of chemotherapy   . Personal history of radiation therapy   . Radiation  03/28/14-05/06/14   Left Breast 60 Gy    Past Surgical History:  Procedure Laterality Date  . BREAST BIOPSY  1996   right breast, benign  . BREAST LUMPECTOMY WITH NEEDLE LOCALIZATION AND AXILLARY SENTINEL LYMPH NODE BX Left 10/29/2013   Procedure: BREAST LUMPECTOMY WITH NEEDLE LOCALIZATION AND AXILLARY SENTINEL LYMPH NODE BX;  Surgeon: Rolm Bookbinder, MD;  Location: [facility];  Service: General;  Laterality: Left;  . COLONOSCOPY  05/2011   WNL  . WISDOM TOOTH EXTRACTION      Allergies  Allergen Reactions  . Erythromycin Other (See Comments)    Stomach pain  . Tape Rash    Family History  Problem Relation Age of Onset  . Squamous cell carcinoma Father   . Cancer Father   . Diabetes Father   . Hypertension Mother   . Diabetes Sister      Social History   Socioeconomic History  . Marital status: Married    Spouse name: Not on file  . Number of children: Not on file  . Years of education: Not on file  . Highest education level: Not on file  Occupational History  . Not on file  Social Needs  . Financial resource strain: Not on file  . Food insecurity:    Worry: Not on file    Inability: Not on file  . Transportation needs:    Medical: Not on file  Non-medical: Not on file  Tobacco Use  . Smoking status: Never Smoker  . Smokeless tobacco: Never Used  Substance and Sexual Activity  . Alcohol use: Yes    Comment: 1-2 drinks a month  . Drug use: No  . Sexual activity: Yes  Lifestyle  . Physical activity:    Days per week: Not on file    Minutes per session: Not on file  . Stress: Not on file  Relationships  . Social connections:    Talks on phone: Not on file    Gets together: Not on file    Attends religious service: Not on file    Active member of club or organization: Not on file    Attends meetings of clubs or organizations: Not on file    Relationship status: Not on file  . Intimate partner violence:    Fear of current or ex  partner: Not on file    Emotionally abused: Not on file    Physically abused: Not on file    Forced sexual activity: Not on file  Other Topics Concern  . Not on file  Social History Narrative  . Not on file     PHYSICAL EXAM:  VS: BP (!) 162/90   Pulse 87   LMP 11/08/2013   SpO2 98%  Physical Exam Gen: NAD, alert, cooperative with exam, well-appearing ENT: normal lips, normal nasal mucosa,  Eye: normal EOM, normal conjunctiva and lids CV:  no edema, +2 pedal pulses   Resp: no accessory muscle use, non-labored,  Skin: no rashes, no areas of induration  Neuro: normal tone, normal sensation to touch Psych:  normal insight, alert and oriented MSK:  Right knee: No obvious effusion. Tenderness palpation over lateral joint line. Limited extension. Normal strength resistance. Negative McMurray's test. Instability with valgus varus testing. Back: Tenderness to palpation of the midline lumbar spine. No tenderness palpation of paraspinal muscles. Exacerbation of pain with try to stand up straight. Normal strength resistance with hip flexion. Normal internal and external rotation. Negative straight leg raise bilaterally. Normal strength resistance with plantarflexion and dorsiflexion    ASSESSMENT & PLAN:   Chronic pain of right knee Symptoms seem to be associated with degenerative meniscal changes as well as arthritis.  Has tried steroid injections in the past with limited improvement. -Counseled on home exercise therapy and supportive care. -Can try gel injections going forward.  Acute bilateral low back pain without sciatica Acute muscle spasm this morning.  No radicular symptoms. -Toradol injection today. -Counseled on home exercise therapy and supportive care. -Duexis and Flexeril. -If no improvement may need to consider physical therapy or imaging.

## 2018-11-11 NOTE — Assessment & Plan Note (Signed)
Symptoms seem to be associated with degenerative meniscal changes as well as arthritis.  Has tried steroid injections in the past with limited improvement. -Counseled on home exercise therapy and supportive care. -Can try gel injections going forward.

## 2018-11-11 NOTE — Assessment & Plan Note (Signed)
Acute muscle spasm this morning.  No radicular symptoms. -Toradol injection today. -Counseled on home exercise therapy and supportive care. -Duexis and Flexeril. -If no improvement may need to consider physical therapy or imaging.

## 2018-11-11 NOTE — Patient Instructions (Signed)
Nice to meet you  Please try the medicine  Please put heat on the area Please try the exercises and keep moving.  Please see me back if you would like to try the injections  Please see me back in 2-3 weeks if your pain back isn't improving.

## 2018-11-14 ENCOUNTER — Telehealth: Payer: Self-pay | Admitting: Family Medicine

## 2018-11-14 ENCOUNTER — Ambulatory Visit (HOSPITAL_COMMUNITY)
Admission: EM | Admit: 2018-11-14 | Discharge: 2018-11-14 | Disposition: A | Discharge status: Home / Self Care | Payer: No Typology Code available for payment source | Attending: Family Medicine | Admitting: Family Medicine

## 2018-11-14 ENCOUNTER — Ambulatory Visit (INDEPENDENT_AMBULATORY_CARE_PROVIDER_SITE_OTHER): Payer: No Typology Code available for payment source

## 2018-11-14 ENCOUNTER — Encounter (HOSPITAL_COMMUNITY): Payer: Self-pay

## 2018-11-14 DIAGNOSIS — M545 Low back pain, unspecified: Secondary | ICD-10-CM

## 2018-11-14 MED ORDER — MELOXICAM 7.5 MG PO TABS: 7.5000 mg | ORAL_TABLET | Freq: Every day | ORAL | 5 refills | Status: DC

## 2018-11-14 MED ORDER — KETOROLAC TROMETHAMINE 30 MG/ML IJ SOLN
30.0000 mg | Freq: Once | INTRAMUSCULAR | Status: AC
  Administered 2018-11-14: 30 mg via INTRAMUSCULAR

## 2018-11-14 MED ORDER — KETOROLAC TROMETHAMINE 30 MG/ML IJ SOLN
INTRAMUSCULAR | Status: AC
  Filled 2018-11-14: qty 1

## 2018-11-14 MED ORDER — TRAMADOL HCL 50 MG PO TABS: 50.0000 mg | ORAL_TABLET | Freq: Four times a day (QID) | ORAL | 0 refills | Status: DC | PRN

## 2018-11-14 NOTE — Telephone Encounter (Signed)
Patient called stating her back pain was getting worse.  I have advised patient to go to [facility] to get possible scans and relief for the weekend.  Please follow up with patient in regard.

## 2018-11-14 NOTE — ED Triage Notes (Signed)
Pt presents with complaints of sudden onset of severe lower back pain on Wednesday morning. Patient is using a walker to help with ambulating. Reports history of same.

## 2018-11-14 NOTE — Discharge Instructions (Signed)
Your x ray revealed some degenerative changes.  We will give you something stronger for pain and a different antiinflammatory to see if this helps.  If you are not better by Monday,  or seeing any improvement in symptoms, your doctor may want to do further imaging with MRI.  You can continue the muscle relaxer Be aware of the medication that were giving you some more severe pain will cause drowsiness For worsening symptoms to include numbness, tingling, loss of bowel or bladder function please go to the hospital

## 2018-11-14 NOTE — ED Provider Notes (Signed)
[place]    CSN: [id number] Arrival date & time: 11/14/18  1014     History   Chief Complaint Chief Complaint  Patient presents with  . Back Pain    HPI Veronica Rogers is a 57 y.o. female.   Patient is a 57 year old female who presents with sudden onset of severe lower lumbar pain that started Wednesday.  She denies any injury prior to the pain.  The pain has been constant but worsening upon movements and position changes.  She describes the pain as sharp and stabbing at times.  She denies any radiation of pain.  she was seen by her primary care doctor on 11/11/2018 where she was given Toradol injection in clinic and sent home with muscle relaxant and ibuprofen.  She is able to get some rest at night with the relaxant.  She called her doctor this morning and was sent here for imaging of the back.  She denies any associated fever, chills, paresthesias, numbness, tingling, loss of bowel or bladder function.   ROS per HPI      Past Medical History:  Diagnosis Date  . Anxiety   . Breast cancer ([place])   . Contact lens/glasses fitting    wears contacts or glasses  . Depression   . Diabetes ([place]) 11/22/2014  . Hyperlipidemia   . Personal history of chemotherapy   . Personal history of radiation therapy   . Radiation 03/28/14-05/06/14   Left Breast 60 Gy    Patient Active Problem List   Diagnosis Date Noted  . Acute bilateral low back pain without sciatica 11/11/2018  . Chronic pain of right knee 11/11/2018  . Hyperlipidemia 08/26/2017  . Type 2 diabetes mellitus with hyperglycemia, without long-term current use of insulin ([place]) 01/08/2016  . Allergic rhinitis 11/22/2014  . 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[place]) 10/14/2013  . Elevated blood pressure (not hypertension) 04/18/2011  . Routine general medical examination at a health care facility 04/18/2011    Past Surgical History:  Procedure Laterality Date  . BREAST BIOPSY  1996     right breast, benign  . BREAST LUMPECTOMY WITH NEEDLE LOCALIZATION AND AXILLARY SENTINEL LYMPH NODE BX Left 10/29/2013   Procedure: BREAST LUMPECTOMY WITH NEEDLE LOCALIZATION AND AXILLARY SENTINEL LYMPH NODE BX;  Surgeon: Rolm Bookbinder, MD;  Location: [facility];  Service: General;  Laterality: Left;  . COLONOSCOPY  05/2011   WNL  . WISDOM TOOTH EXTRACTION      OB History   None      Home Medications    Prior to Admission medications   Medication Sig Start Date End Date Taking? Authorizing Provider  ACCU-CHEK FASTCLIX LANCETS MISC UAD tid for glucose monitoring    [provider]  albuterol (PROVENTIL HFA;VENTOLIN HFA) 108 (90 Base) MCG/ACT inhaler Inhale 1-2 puffs into the lungs every 6 (six) hours as needed for wheezing or shortness of breath. 04/15/18   Libby Maw, MD  cyclobenzaprine (FLEXERIL) 5 MG tablet Take 1 tablet (5 mg total) by mouth 3 (three) times daily as needed for muscle spasms. 11/11/18   Rosemarie Ax, MD  fenofibrate (TRICOR) 145 MG tablet Take 1 tablet (145 mg total) by mouth daily. 11/28/17   Philemon Kingdom, MD  glipiZIDE (GLUCOTROL) 5 MG tablet TAKE 1 TABLET BY MOUTH 1x DAILY BEFORE MEALS 11/28/17   Philemon Kingdom, MD  glipiZIDE (GLUCOTROL) 5 MG tablet Take 1-2 with pm meal daily 09/16/18   Philemon Kingdom, MD  glucose blood (ACCU-CHEK GUIDE) test strip UAD tid for glucose monitoring    [provider]  Ibuprofen-Famotidine 800-26.6 MG TABS Take 1 tablet by mouth 3 (three) times daily. 11/11/18   Rosemarie Ax, MD  JANUVIA 100 MG tablet TAKE 1 TABLET (100 MG TOTAL) BY MOUTH DAILY. 09/16/18   Philemon Kingdom, MD  meloxicam (MOBIC) 7.5 MG tablet Take 1 tablet (7.5 mg total) by mouth daily. 11/14/18   Loura Halt A, NP  metFORMIN (GLUCOPHAGE-XR) 750 MG 24 hr tablet TAKE 2 TABLETS BY MOUTH DAILY AT BEDTIME 09/16/18   Philemon Kingdom, MD  Multiple Vitamin (MULTIVITAMIN) tablet Take 1 tablet by mouth  daily.    [provider]  sertraline (ZOLOFT) 50 MG tablet Take 50 mg by mouth every morning.     [provider]  tamoxifen (NOLVADEX) 20 MG tablet Take 1 tablet (20 mg total) by mouth daily. 08/07/18   Nicholas Lose, MD  traMADol (ULTRAM) 50 MG tablet Take 1 tablet (50 mg total) by mouth every 6 (six) hours as needed. 11/14/18   Orvan July, NP    Family History Family History  Problem Relation Age of Onset  . Squamous cell carcinoma Father   . Cancer Father   . Diabetes Father   . Hypertension Mother   . Diabetes Sister     Social History Social History   Tobacco Use  . Smoking status: Never Smoker  . Smokeless tobacco: Never Used  Substance Use Topics  . Alcohol use: Yes    Comment: 1-2 drinks a month  . Drug use: No     Allergies   Erythromycin and Tape   Review of Systems Review of Systems   Physical Exam Triage Vital Signs ED Triage Vitals [11/14/18 1050]  Enc Vitals Group     BP 116/71     Pulse Rate 92     Resp 18     Temp (!) 97 F (36.1 C)     Temp src      SpO2 98 %     Weight      Height      Head Circumference      Peak Flow      Pain Score 10     Pain Loc      Pain Edu?      Excl. in [place]?    No data found.  Updated Vital Signs BP 116/71   Pulse 92   Temp (!) 97 F (36.1 C)   Resp 18   LMP 11/08/2013   SpO2 98%   Visual Acuity Right Eye Distance:   Left Eye Distance:   Bilateral Distance:    Right Eye Near:   Left Eye Near:    Bilateral Near:     Physical Exam  Constitutional: She appears well-developed and well-nourished.  HENT:  Head: Normocephalic and atraumatic.  Eyes: Conjunctivae are normal.  Neck: Normal range of motion.  Pulmonary/Chest: Effort normal.  Musculoskeletal: She exhibits tenderness. She exhibits no edema or deformity.  Patient walking with walker in room due to limited mobility.  Tenderness to the lower lumbar spine. No obvious bruising, erythema, swelling. Patient has  second-degree burn to mid upper back area from heating pad.  Neurological: She is alert.  Skin: Skin is warm and dry.  Psychiatric: She has a normal mood and affect.  Nursing note and vitals reviewed.    UC Treatments / Results  Labs (all labs ordered are listed, but only abnormal results are  displayed) Labs Reviewed - No data to display  EKG None  Radiology Dg Lumbar Spine Complete  Result Date: 11/14/2018 CLINICAL DATA:  Low back pain for 2 days, no known injury, initial encounter EXAM: LUMBAR SPINE - COMPLETE 4+ VIEW COMPARISON:  None. FINDINGS: Five lumbar type vertebral bodies are well visualized. Vertebral body height is well maintained. No definitive compression fracture is seen. No pars defects are noted. No soft tissue abnormality is noted. Disc space narrowing is noted from L2-S1. IMPRESSION: Degenerative change without acute abnormality. Electronically Signed   By: Inez Catalina M.D.   On: 11/14/2018 11:29    Procedures Procedures (including critical care time)  Medications Ordered in UC Medications  ketorolac (TORADOL) 30 MG/ML injection 30 mg (30 mg Intramuscular Given 11/14/18 1206)    Initial Impression / Assessment and Plan / UC Course  I have reviewed the triage vital signs and the nursing notes.  Pertinent labs & imaging results that were available during my care of the patient were reviewed by me and considered in my medical decision making (see chart for details).     Patient is a 68-year female presents for continued lower back pain. She was seen by her doctor on 01/11/2018 and prescribed muscle relaxant and ibuprofen. She has had minimal relief of symptoms with this. She was sent here for further evaluation imaging X-ray revealed mild degenerative changes and disc space narrowing L2-S2.  She is not having any radicular symptoms We will try a different anti-inflammatory and add tramadol for pain. If she still has continued symptoms she will need to  follow back up with her primary care for possible MRI. Toradol injection given in clinic Instructed on warnings of heat pad due to burn on her back Patient understanding and agreeable to plan Final Clinical Impressions(s) / UC Diagnoses   Final diagnoses:  Acute midline low back pain without sciatica     Discharge Instructions     Your x ray revealed some degenerative changes.  We will give you something stronger for pain and a different antiinflammatory to see if this helps.  If you are not better by Monday,  or seeing any improvement in symptoms, your doctor may want to do further imaging with MRI.  You can continue the muscle relaxer Be aware of the medication that were giving you some more severe pain will cause drowsiness For worsening symptoms to include numbness, tingling, loss of bowel or bladder function please go to the hospital      ED Prescriptions    Medication Sig Dispense Auth. Provider   meloxicam (MOBIC) 7.5 MG tablet Take 1 tablet (7.5 mg total) by mouth daily. 30 tablet Reannon Candella A, NP   traMADol (ULTRAM) 50 MG tablet Take 1 tablet (50 mg total) by mouth every 6 (six) hours as needed. 10 tablet Loura Halt A, NP     Controlled Substance Prescriptions [place] Controlled Substance Registry consulted? Not Applicable   Orvan July, NP 11/14/18 1242

## 2018-11-16 ENCOUNTER — Telehealth: Payer: Self-pay

## 2018-11-16 NOTE — Telephone Encounter (Signed)
Copied from [place] [id number]. Topic: Referral - Request for Referral >> Nov 16, 2018  7:54 AM Veronica Rogers wrote: Has patient seen PCP for this complaint? yes Pt's pcp is Dr Deborra Medina, but she has seen Dr Raeford Razor for her back pain.  Pt states it has been 6 days and she is in severe back pain.  And it is not getting better.  Pt has been out of work  for the past 6 days.  Pt requesting an MRI, as she has had X-rays done at [facility]. Pt is requesting ASAP. Pt states she wants the first appt available, and hopes you can make it happen today. Pt states she has already seen several drs, so does not feel like another appt is needed in the office.  Please advise

## 2018-11-16 NOTE — Telephone Encounter (Signed)
Dr. Kathreen Cosier, [place] know much more about ordering MRIs in terms of coverage and appropriate management.  I unfortunately had to leave the office abruptly and unexpectedly.  Would you be willing to see her?  Very kind patient.

## 2018-11-16 NOTE — Telephone Encounter (Signed)
Patient called team health on 11/15/18 reporting terrible back pain since last Wednesday after and put on muscle relaxers. States she had an xray yesterday that showed slight degenerative disc and was seen on 11/16 in urgent care where they stated she needed an MRI.  She is requesting for someone to order the MRI (on Sunday) as she did not think should could make it until 11/18 (today) for an appointment.  [facility] recommended alternating ibuprofen and tylenol along with the tramadol to see if that helped with pain and to see urgent care or ER  Within the nxt 3-4 hours.   On Call Dr. Danise Mina consulted and gave the orders in regards to ibuprofen/tylenol and to go to ER as urgent care will not be able to much more than they already have.  As this is a patient of Dr. Hulen Shouts, I will forward a copy of this team health call to her at her office in [place].    Thanks.

## 2018-11-16 NOTE — Telephone Encounter (Signed)
Copied from [place] [id number]. Topic: Referral - Request for Referral >> Nov 16, 2018  7:54 AM Scherrie Gerlach wrote: Has patient seen PCP for this complaint? yes Pt's pcp is Dr Deborra Medina, but she has seen Dr Raeford Razor for her back pain.  Pt states it has been 6 days and she is in severe back pain.  And it is not getting better.  Pt has been out of work  for the past 6 days.  Pt requesting an MRI, as she has had X-rays done at [facility]. Pt is requesting ASAP. Pt states she wants the first appt available, and hopes you can make it happen today. Pt states she has already seen several drs, so does not feel like another appt is needed in the office.  Please advise >> Nov 16, 2018  9:34 AM Yvette Rack wrote: Pt called back stating she spoke with Radiology and she was told to have a stat MRI ordered so she can be seen today.

## 2018-11-16 NOTE — Telephone Encounter (Signed)
Spoke with patient about her pain. Would continue medication. If pain continues would consider PT or trigger point injections. MRI would be indicated if concerned for compression fracture. Less likely for nerve impingement.   Rosemarie Ax, MD [facility] 11/16/2018, 5:40 PM

## 2018-11-17 NOTE — Telephone Encounter (Signed)
JS called pt/see phone note/thx dmf

## 2018-11-24 ENCOUNTER — Ambulatory Visit (INDEPENDENT_AMBULATORY_CARE_PROVIDER_SITE_OTHER): Payer: No Typology Code available for payment source

## 2018-11-24 ENCOUNTER — Encounter: Payer: Self-pay | Admitting: Family Medicine

## 2018-11-24 ENCOUNTER — Ambulatory Visit (INDEPENDENT_AMBULATORY_CARE_PROVIDER_SITE_OTHER): Payer: No Typology Code available for payment source | Admitting: Family Medicine

## 2018-11-24 VITALS — BP 122/80 | HR 82 | Temp 98.2°F | Ht 67.5 in | Wt 199.0 lb

## 2018-11-24 DIAGNOSIS — M1711 Unilateral primary osteoarthritis, right knee: Secondary | ICD-10-CM | POA: Diagnosis not present

## 2018-11-24 DIAGNOSIS — M79644 Pain in right finger(s): Secondary | ICD-10-CM | POA: Diagnosis not present

## 2018-11-24 MED ORDER — DICLOFENAC SODIUM 2 % TD SOLN: 1.0000 "application " | Freq: Two times a day (BID) | TRANSDERMAL | 3 refills | Status: DC

## 2018-11-24 NOTE — Assessment & Plan Note (Signed)
There appears to be a cystic structure to the ulnar side of the MCP joint of the third digit. - injection and aspiration  - if no improvement consider MRI

## 2018-11-24 NOTE — Progress Notes (Signed)
Veronica Rogers - 57 y.o. female MRN [id number]  Date of birth: 30-Oct-1961  SUBJECTIVE:  Including CC & ROS.  Chief Complaint  Patient presents with  . Follow-up    back pain , right knee and right middle finger    Veronica Rogers is a 57 y.o. female that is presenting with acute on chronic right knee pain and then right finger pain.  Pain is occurring over the lateral joint line.  She reports swelling.  The pain is throbbing in nature and worse at the end of the day.  She was received steroid injections in the past that have only had limited improvement.  The pain is moderate to severe.  She denies any locking or giving way.  She has taken medications with limited improvement.  Pain is worse with certain movements.  Having pain to the ulnar side of the third MCP joint.  Pain is worse when she is typing all day.  She noticed some swelling in this area.  Denies any inciting event or injury.  Pain is sharp and localized to this area.  Denies any redness or warmth.  Independent review of the right knee x-ray from 2018 shows significant disease in the lateral compartment.   Review of Systems  Constitutional: Negative for fever.  HENT: Negative for congestion.   Respiratory: Negative for cough.   Cardiovascular: Negative for chest pain.  Gastrointestinal: Negative for abdominal pain.  Musculoskeletal: Positive for arthralgias and joint swelling.  Skin: Negative for color change.  Neurological: Negative for weakness.  Hematological: Negative for adenopathy.  Psychiatric/Behavioral: Negative for agitation.    HISTORY: Past Medical, Surgical, Social, and Family History Reviewed & Updated per EMR.   Pertinent Historical Findings include:  Past Medical History:  Diagnosis Date  . Anxiety   . Breast cancer ([place])   . Contact lens/glasses fitting    wears contacts or glasses  . Depression   . Diabetes ([place]) 11/22/2014  . Hyperlipidemia   . Personal history of chemotherapy   .  Personal history of radiation therapy   . Radiation 03/28/14-05/06/14   Left Breast 60 Gy    Past Surgical History:  Procedure Laterality Date  . BREAST BIOPSY  1996   right breast, benign  . BREAST LUMPECTOMY WITH NEEDLE LOCALIZATION AND AXILLARY SENTINEL LYMPH NODE BX Left 10/29/2013   Procedure: BREAST LUMPECTOMY WITH NEEDLE LOCALIZATION AND AXILLARY SENTINEL LYMPH NODE BX;  Surgeon: Rolm Bookbinder, MD;  Location: [place];  Service: General;  Laterality: Left;  . COLONOSCOPY  05/2011   WNL  . WISDOM TOOTH EXTRACTION      Allergies  Allergen Reactions  . Erythromycin Other (See Comments)    Stomach pain  . Tape Rash    Family History  Problem Relation Age of Onset  . Squamous cell carcinoma Father   . Cancer Father   . Diabetes Father   . Hypertension Mother   . Diabetes Sister      Social History   Socioeconomic History  . Marital status: Married    Spouse name: Not on file  . Number of children: Not on file  . Years of education: Not on file  . Highest education level: Not on file  Occupational History  . Not on file  Social Needs  . Financial resource strain: Not on file  . Food insecurity:    Worry: Not on file    Inability: Not on file  . Transportation needs:    Medical: Not on  file    Non-medical: Not on file  Tobacco Use  . Smoking status: Never Smoker  . Smokeless tobacco: Never Used  Substance and Sexual Activity  . Alcohol use: Yes    Comment: 1-2 drinks a month  . Drug use: No  . Sexual activity: Yes  Lifestyle  . Physical activity:    Days per week: Not on file    Minutes per session: Not on file  . Stress: Not on file  Relationships  . Social connections:    Talks on phone: Not on file    Gets together: Not on file    Attends religious service: Not on file    Active member of club or organization: Not on file    Attends meetings of clubs or organizations: Not on file    Relationship status: Not on file  .  Intimate partner violence:    Fear of current or ex partner: Not on file    Emotionally abused: Not on file    Physically abused: Not on file    Forced sexual activity: Not on file  Other Topics Concern  . Not on file  Social History Narrative  . Not on file     PHYSICAL EXAM:  VS: BP 122/80 (BP Location: Right Arm, Patient Position: Sitting, Cuff Size: Normal)   Pulse 82   Temp 98.2 F (36.8 C) (Oral)   Ht 5' 7.5" (1.715 m)   Wt 199 lb (90.3 kg)   LMP 11/08/2013   SpO2 95%   BMI 30.71 kg/m  Physical Exam Gen: NAD, alert, cooperative with exam, well-appearing ENT: normal lips, normal nasal mucosa,  Eye: normal EOM, normal conjunctiva and lids CV:  no edema, +2 pedal pulses   Resp: no accessory muscle use, non-labored,   Skin: no rashes, no areas of induration  Neuro: normal tone, normal sensation to touch Psych:  normal insight, alert and oriented MSK:  Right knee: Normal to inspection with no erythema  No effusion appreciated. Palpation normal with no warmth, patellar tenderness, or condyle tenderness. Lateral joint line tenderness to palpation ROM full in flexion and extension and lower leg rotation. Instability with valgus and varus stress testing Negative Mcmurray's, Thessalonian tests. Non painful patellar compression. Patellar glide without crepitus. Patellar and quadriceps tendons unremarkable. Hamstring and quadriceps strength is normal.  Right hand:  Tenderness to palpation at the third dorsal MCP joint. Normal finger range of motion. Normal grip strength. Normal finger abduction and abduction strength resistance. Neurovascular intact   Aspiration/Injection Procedure Note Veronica Rogers Sep 11, 1961  Procedure: Injection Indications: right knee pain   Procedure Details Consent: Risks of procedure as well as the alternatives and risks of each were explained to the (patient/caregiver).  Consent for procedure obtained. Time Out: Verified patient  identification, verified procedure, site/side was marked, verified correct patient position, special equipment/implants available, medications/allergies/relevent history reviewed, required imaging and test results available.  Performed.  The area was cleaned with iodine and alcohol swabs.    The right knee superior lateral suprapatellar pouch was injected using 4 cc's of 1% lidocaine with a 22 1 1/2" needle.  The syringe was switched and a 24mg /61mL of Hymovis was injected. Ultrasound was used. Images were obtained in  Long views showing the injection.    A sterile dressing was applied.  Patient did tolerate procedure well.   Aspiration/Injection Procedure Note BRITTANYANN WITTNER Oct 09, 1961  Procedure: Aspiration and Injection Indications: right finger pain   Procedure Details Consent: Risks of procedure as  well as the alternatives and risks of each were explained to the (patient/caregiver).  Consent for procedure obtained. Time Out: Verified patient identification, verified procedure, site/side was marked, verified correct patient position, special equipment/implants available, medications/allergies/relevent history reviewed, required imaging and test results available.  Performed.  The area was cleaned with iodine and alcohol swabs.    The right finger cyst located to the ulnar aspect of 3rd MTC joing was injected using 2 cc's of 1% lidocaine with a 25 1 1/2" needle.  An 18-gauge inch and half needle was inserted into the cyst but little to no aspiration was achieved.  Ultrasound was used. Images were obtained in Long views showing the injection.    A sterile dressing was applied.  Patient did tolerate procedure well.    ASSESSMENT & PLAN:   OA (osteoarthritis) of knee Has a mild to moderate effusion in the suprapatellar pouch.  Previous imaging is demonstrated lateral joint disease. - Started the series of hymovis that was patient supplied - pennsaid  - She will follow-up in 1 week  final injection.   Finger pain, right There appears to be a cystic structure to the ulnar side of the MCP joint of the third digit. - injection and aspiration  - if no improvement consider MRI

## 2018-11-24 NOTE — Assessment & Plan Note (Addendum)
Has a mild to moderate effusion in the suprapatellar pouch.  Previous imaging is demonstrated lateral joint disease. - Started the series of hymovis that was patient supplied - pennsaid  - She will follow-up in 1 week final injection.

## 2018-11-24 NOTE — Patient Instructions (Signed)
Good to see you  Please follow up in one week to get the second injection for your knee  Please ice for your knee  Please see me back in 3-4 weeks if your finger doesn't improve

## 2018-12-01 ENCOUNTER — Ambulatory Visit: Payer: No Typology Code available for payment source | Admitting: Family Medicine

## 2018-12-03 ENCOUNTER — Ambulatory Visit (INDEPENDENT_AMBULATORY_CARE_PROVIDER_SITE_OTHER): Payer: No Typology Code available for payment source | Admitting: Family Medicine

## 2018-12-03 ENCOUNTER — Ambulatory Visit (INDEPENDENT_AMBULATORY_CARE_PROVIDER_SITE_OTHER): Payer: No Typology Code available for payment source

## 2018-12-03 DIAGNOSIS — M1711 Unilateral primary osteoarthritis, right knee: Secondary | ICD-10-CM | POA: Diagnosis not present

## 2018-12-03 NOTE — Progress Notes (Signed)
Veronica Rogers - 57 y.o. female MRN [id number]  Date of birth: 03/21/1961  SUBJECTIVE:  Including CC & ROS.  No chief complaint on file.   Veronica Rogers is a 57 y.o. female that is  Presenting with right knee pain. Here to finish her series of hymovis.    Review of Systems  HISTORY: Past Medical, Surgical, Social, and Family History Reviewed & Updated per EMR.   Pertinent Historical Findings include:  Past Medical History:  Diagnosis Date  . Anxiety   . Breast cancer ([place])   . Contact lens/glasses fitting    wears contacts or glasses  . Depression   . Diabetes ([place]) 11/22/2014  . Hyperlipidemia   . Personal history of chemotherapy   . Personal history of radiation therapy   . Radiation 03/28/14-05/06/14   Left Breast 60 Gy    Past Surgical History:  Procedure Laterality Date  . BREAST BIOPSY  1996   right breast, benign  . BREAST LUMPECTOMY WITH NEEDLE LOCALIZATION AND AXILLARY SENTINEL LYMPH NODE BX Left 10/29/2013   Procedure: BREAST LUMPECTOMY WITH NEEDLE LOCALIZATION AND AXILLARY SENTINEL LYMPH NODE BX;  Surgeon: Rolm Bookbinder, MD;  Location: [facility];  Service: General;  Laterality: Left;  . COLONOSCOPY  05/2011   WNL  . WISDOM TOOTH EXTRACTION      Allergies  Allergen Reactions  . Erythromycin Other (See Comments)    Stomach pain  . Tape Rash    Family History  Problem Relation Age of Onset  . Squamous cell carcinoma Father   . Cancer Father   . Diabetes Father   . Hypertension Mother   . Diabetes Sister      Social History   Socioeconomic History  . Marital status: Married    Spouse name: Not on file  . Number of children: Not on file  . Years of education: Not on file  . Highest education level: Not on file  Occupational History  . Not on file  Social Needs  . Financial resource strain: Not on file  . Food insecurity:    Worry: Not on file    Inability: Not on file  . Transportation needs:    Medical: Not on file      Non-medical: Not on file  Tobacco Use  . Smoking status: Never Smoker  . Smokeless tobacco: Never Used  Substance and Sexual Activity  . Alcohol use: Yes    Comment: 1-2 drinks a month  . Drug use: No  . Sexual activity: Yes  Lifestyle  . Physical activity:    Days per week: Not on file    Minutes per session: Not on file  . Stress: Not on file  Relationships  . Social connections:    Talks on phone: Not on file    Gets together: Not on file    Attends religious service: Not on file    Active member of club or organization: Not on file    Attends meetings of clubs or organizations: Not on file    Relationship status: Not on file  . Intimate partner violence:    Fear of current or ex partner: Not on file    Emotionally abused: Not on file    Physically abused: Not on file    Forced sexual activity: Not on file  Other Topics Concern  . Not on file  Social History Narrative  . Not on file     PHYSICAL EXAM:  VS: LMP 11/08/2013  Physical  Exam Gen: NAD, alert, cooperative with exam, well-appearing   Aspiration/Injection Procedure Note Veronica Rogers [birth year]  Procedure: Injection Indications: right knee pain  Procedure Details Consent: Risks of procedure as well as the alternatives and risks of each were explained to the (patient/caregiver).  Consent for procedure obtained. Time Out: Verified patient identification, verified procedure, site/side was marked, verified correct patient position, special equipment/implants available, medications/allergies/relevent history reviewed, required imaging and test results available.  Performed.  The area was cleaned with iodine and alcohol swabs.    The right knee superior lateral suprapatellar pouch was injected using 4 cc's of 1% lidocaine with a 22 1 1/2" needle.  The syringe was then switched out and an injectate of 24 mg/3 mL hymovis was injected. Ultrasound was used. Images were obtained in Long views showing the  injection.    A sterile dressing was applied.  Patient did tolerate procedure well.        ASSESSMENT & PLAN:   OA (osteoarthritis) of knee Finished her series of hymovis today  - patient supplied hymovis for injection  - f/u 4 weeks PRN

## 2018-12-03 NOTE — Assessment & Plan Note (Signed)
Finished her series of hymovis today  - patient supplied hymovis for injection  - f/u 4 weeks PRN

## 2018-12-03 NOTE — Patient Instructions (Signed)
Good to see you  Please continue to ice and perform the exercises  Please see me back in 4 weeks.

## 2018-12-09 ENCOUNTER — Telehealth: Payer: Self-pay | Admitting: Family Medicine

## 2018-12-09 NOTE — Telephone Encounter (Signed)
Copied from [place] [id number]. Topic: General - Other >> Dec 09, 2018  1:11 PM Lennox Solders wrote: Reason for CRM: [facility] with [facility] is calling and needs PA for duexis phone number [phone]

## 2018-12-10 NOTE — Telephone Encounter (Signed)
PA

## 2018-12-10 NOTE — Telephone Encounter (Signed)
PA initiated, waiting on response 48 hrs.

## 2018-12-18 ENCOUNTER — Encounter: Payer: Self-pay | Admitting: Internal Medicine

## 2018-12-18 ENCOUNTER — Other Ambulatory Visit: Payer: Self-pay

## 2018-12-18 MED ORDER — ACCU-CHEK GUIDE W/DEVICE KIT: 1.0000 | PACK | Freq: Every day | 0 refills | Status: DC

## 2018-12-25 ENCOUNTER — Other Ambulatory Visit: Payer: Self-pay | Admitting: Internal Medicine

## 2018-12-25 DIAGNOSIS — E785 Hyperlipidemia, unspecified: Secondary | ICD-10-CM

## 2018-12-25 MED FILL — glipiZIDE 5 MG TABS: 5 | 90 days supply | Qty: 180 | Fill #1

## 2018-12-25 MED FILL — metFORMIN HCL ER 750 MG TB2: 750 | 90 days supply | Qty: 180 | Fill #1

## 2018-12-25 MED FILL — MELOXICAM 7.5 MG TABLET: 7.5 | 30 days supply | Qty: 30 | Fill #3

## 2018-12-25 MED FILL — JANUVIA 100 MG TABLET: 100 | 90 days supply | Qty: 90 | Fill #1

## 2018-12-25 MED FILL — FENOFIBRATE 145 MG TABLET: 145 | 90 days supply | Qty: 90 | Fill #0

## 2018-12-31 ENCOUNTER — Encounter: Payer: Self-pay | Admitting: Internal Medicine

## 2018-12-31 ENCOUNTER — Telehealth: Payer: Self-pay | Admitting: Internal Medicine

## 2018-12-31 ENCOUNTER — Other Ambulatory Visit: Payer: Self-pay

## 2018-12-31 MED ORDER — ACCU-CHEK FASTCLIX LANCETS MISC: 2 refills | Status: DC

## 2018-12-31 MED ORDER — GLUCOSE BLOOD VI STRP: ORAL_STRIP | 2 refills | Status: DC

## 2018-12-31 NOTE — Telephone Encounter (Signed)
Sent both to [facility]

## 2018-12-31 NOTE — Telephone Encounter (Signed)
Pharmacy is calling stating that patient already had the accu-check meter, they are needing lancets and test strips. Please Advise, thanks

## 2019-01-08 ENCOUNTER — Ambulatory Visit (INDEPENDENT_AMBULATORY_CARE_PROVIDER_SITE_OTHER): Payer: No Typology Code available for payment source | Admitting: Internal Medicine

## 2019-01-08 ENCOUNTER — Ambulatory Visit: Payer: No Typology Code available for payment source | Admitting: Internal Medicine

## 2019-01-08 ENCOUNTER — Encounter: Payer: Self-pay | Admitting: Internal Medicine

## 2019-01-08 VITALS — BP 148/80 | HR 88 | Ht 67.5 in | Wt 198.0 lb

## 2019-01-08 DIAGNOSIS — E1165 Type 2 diabetes mellitus with hyperglycemia: Secondary | ICD-10-CM | POA: Diagnosis not present

## 2019-01-08 DIAGNOSIS — E785 Hyperlipidemia, unspecified: Secondary | ICD-10-CM

## 2019-01-08 LAB — POCT GLYCOSYLATED HEMOGLOBIN (HGB A1C): Hemoglobin A1C: 6.7 % — AB (ref 4.0–5.6)

## 2019-01-08 NOTE — Addendum Note (Signed)
Addended by: Cardell Peach I on: 01/08/2019 11:57 AM   Modules accepted: Orders

## 2019-01-08 NOTE — Progress Notes (Signed)
Patient ID: Veronica Rogers, female   DOB: 1961/07/22, 58 y.o.   MRN: [id number]   HPI: Veronica Rogers is a 58 y.o.-year-old female, returning for f/u for DM2, dx in 2015 (after steroids), non-insulin-dependent, uncontrolled, without long term complications. Last visit 4 months ago.  Sugars are higher lately as she has been out of metformin x 1 mo and also did not participate into the [place] program at work.  She recently rejoined.  Last hemoglobin A1c was: Lab Results  Component Value Date   HGBA1C 7.2 (A) 09/03/2018   HGBA1C 7.4 04/24/2018   HGBA1C 6.9 11/28/2017   Pt is on a regimen of:  - Metformin ER 1500 mg with dinner - Januvia 100 mg before breakfast - Glipizide 5 mg before dinner  We stopped Glipizide 5 mg before dinner in 12/2016 She was on insulin during ChTx. She was on regular metformin >> GI upset (diarrhea). She has frequent urination.  Pt checks her sugars 1-3 times a day - am:  138-147 >> 102-163 >> 127-137 >> 123-134 - 2h after b'fast: 93-148 >> n/c >> 95-101 >> 120 - before lunch:156 >> 90-110 >> 98-169 >> n/c - 2h after lunch: n/c >> 109 >> n/c >> 140-152 >> n/c - before dinner: n/c >> 124-136, 192 >> n/c >> 134, 135 - 2h after dinner: n/c >> 160s >> 107-211 >> 152 >> n/c - bedtime: n/c   - nighttime: n/c >> 135 >> 110 >> 98 >> 95-99 >> 106 Lowest sugar was 93 >> 106,  she has hypoglycemia awareness in the 70s. Highest sugar was  211 >> 229 (large sandwich).  Glucometer: True test >> AccuChek guide  Pt's meals are: - Breakfast: cereals or 2 eggs or skips - 10:30 am: PB crackers - Lunch: leftovers from home: sandwich; soup; meat + veggies; chinese; salad - Dinner: eats out most dinners! She was previously going to [employer] twice a week but stopped before last visit.  -No CKD, last BUN/creatinine:  Lab Results  Component Value Date   BUN 17 01/19/2018   CREATININE 0.73 01/19/2018   -+ HL; last set of lipids: Lab Results  Component Value  Date   CHOL 174 09/03/2018   HDL 43.90 09/03/2018   LDLCALC 96 09/03/2018   LDLDIRECT 111.0 01/19/2018   TRIG 166.0 (H) 09/03/2018   CHOLHDL 4 09/03/2018  She continues on fenofibrate >> not consistently.  Not on a statin. - last eye exam was in 02/2018: no DR.  Dr. Katy Fitch. -Denies numbness and tingling in her feet.  Has a history of plantar fasciitis  H/o BrCA 2014-2015. On Tamoxifen  thisfor 5 years >> she will finish the course in 2020.  ROS: Constitutional: no weight gain/no weight loss, no fatigue, no subjective hyperthermia, no subjective hypothermia Eyes: no blurry vision, no xerophthalmia ENT: no sore throat, no nodules palpated in neck, no dysphagia, no odynophagia, no hoarseness Cardiovascular: no CP/no SOB/no palpitations/no leg swelling Respiratory: no cough/no SOB/no wheezing Gastrointestinal: no N/no V/no D/no C/no acid reflux Musculoskeletal: no muscle aches/no joint aches Skin: no rashes, no hair loss Neurological: no tremors/no numbness/no tingling/no dizziness  I reviewed pt's medications, allergies, PMH, social hx, family hx, and changes were documented in the history of present illness. Otherwise, unchanged from my initial visit note.  Past Medical History:  Diagnosis Date  . Anxiety   . Breast cancer ([place])   . Contact lens/glasses fitting    wears contacts or glasses  . Depression   .  Diabetes ([place]) 11/22/2014  . Hyperlipidemia   . Personal history of chemotherapy   . Personal history of radiation therapy   . Radiation 03/28/14-05/06/14   Left Breast 60 Gy   Past Surgical History:  Procedure Laterality Date  . BREAST BIOPSY  1996   right breast, benign  . BREAST LUMPECTOMY WITH NEEDLE LOCALIZATION AND AXILLARY SENTINEL LYMPH NODE BX Left 10/29/2013   Procedure: BREAST LUMPECTOMY WITH NEEDLE LOCALIZATION AND AXILLARY SENTINEL LYMPH NODE BX;  Surgeon: Rolm Bookbinder, MD;  Location: [facility];  Service: General;  Laterality: Left;  .  COLONOSCOPY  05/2011   WNL  . WISDOM TOOTH EXTRACTION     Social History   Social History  . Marital Status: Married    Spouse Name: N/A  . Number of Children: 2   Occupational History  . [employer] - support rep   Social History Main Topics  . Smoking status: Never Smoker   . Smokeless tobacco: Never Used  . Alcohol Use: Yes     Comment: 1-2 drinks a month  . Drug Use: No   Current Outpatient Medications on File Prior to Visit  Medication Sig Dispense Refill  . ACCU-CHEK FASTCLIX LANCETS MISC Use to test blood sugar 3 times daily 302 each 2  . albuterol (PROVENTIL HFA;VENTOLIN HFA) 108 (90 Base) MCG/ACT inhaler Inhale 1-2 puffs into the lungs every 6 (six) hours as needed for wheezing or shortness of breath. 1 Inhaler 1  . Blood Glucose Monitoring Suppl (ACCU-CHEK GUIDE) w/Device KIT 1 Device by Does not apply route daily. Use to test blood sugar daily 1 kit 0  . cyclobenzaprine (FLEXERIL) 5 MG tablet Take 1 tablet (5 mg total) by mouth 3 (three) times daily as needed for muscle spasms. 30 tablet 0  . Diclofenac Sodium (PENNSAID) 2 % SOLN Place 1 application onto the skin 2 (two) times daily. 1 Bottle 3  . fenofibrate (TRICOR) 145 MG tablet TAKE 1 TABLET BY MOUTH DAILY. 90 tablet 3  . glipiZIDE (GLUCOTROL) 5 MG tablet TAKE 1 TABLET BY MOUTH 1x DAILY BEFORE MEALS 180 tablet 3  . glipiZIDE (GLUCOTROL) 5 MG tablet Take 1-2 with pm meal daily 180 tablet 3  . glucose blood (ACCU-CHEK GUIDE) test strip Use to test blood sugar 3 times daily 300 each 2  . Ibuprofen-Famotidine 800-26.6 MG TABS Take 1 tablet by mouth 3 (three) times daily. 90 tablet 3  . JANUVIA 100 MG tablet TAKE 1 TABLET (100 MG TOTAL) BY MOUTH DAILY. 90 tablet 3  . meloxicam (MOBIC) 7.5 MG tablet Take 1 tablet (7.5 mg total) by mouth daily. 30 tablet 5  . metFORMIN (GLUCOPHAGE-XR) 750 MG 24 hr tablet TAKE 2 TABLETS BY MOUTH DAILY AT BEDTIME 180 tablet 3  . Multiple Vitamin (MULTIVITAMIN) tablet Take 1  tablet by mouth daily.    . sertraline (ZOLOFT) 50 MG tablet Take 50 mg by mouth every morning.     . tamoxifen (NOLVADEX) 20 MG tablet Take 1 tablet (20 mg total) by mouth daily. 90 tablet 3  . traMADol (ULTRAM) 50 MG tablet Take 1 tablet (50 mg total) by mouth every 6 (six) hours as needed. 10 tablet 0   No current facility-administered medications on file prior to visit.    Allergies  Allergen Reactions  . Erythromycin Other (See Comments)    Stomach pain  . Tape Rash   Family History  Problem Relation Age of Onset  . Squamous cell carcinoma Father   .  Cancer Father   . Diabetes Father   . Hypertension Mother   . Diabetes Sister    PE: BP (!) 148/80   Pulse 88   Ht 5' 7.5" (1.715 m)   Wt 198 lb (89.8 kg)   LMP 11/08/2013   SpO2 98%   BMI 30.55 kg/m  Body mass index is 30.55 kg/m. Wt Readings from Last 3 Encounters:  01/08/19 198 lb (89.8 kg)  11/24/18 199 lb (90.3 kg)  09/03/18 198 lb (89.8 kg)   Constitutional: overweight, in NAD Eyes: PERRLA, EOMI, no exophthalmos ENT: moist mucous membranes, no thyromegaly, no cervical lymphadenopathy Cardiovascular: RRR, No MRG Respiratory: CTA B Gastrointestinal: abdomen soft, NT, ND, BS+ Musculoskeletal: no deformities, strength intact in all 4 Skin: moist, warm, no rashes Neurological: no tremor with outstretched hands, DTR normal in all 4  ASSESSMENT: 1. DM2, non-insulin-dependent, uncontrolled, without long term complications, but with hyperglycemia  2. HL  3.  Obesity  PLAN:  1. Patient with longstanding, uncontrolled, type 2 diabetes, on metformin, DPP 4 inhibitor, and also sulfonylurea.  She refused repeatedly a GLP-1 receptor agonist or any other injectables in the past.  Before last visit, her sugars improved after improvement in her diet.  She was also planning to start going to the gym. -Since last visit, she ran out of metformin for 3 weeks and her sugars in the morning stayed in the 170s.  In the last few  weeks, she started to take metformin more consistently and also rejoined the [facility] program at work and sugars improved. I advised her to move Glipizide before dinner as now she is taking this after this meal. - at this visit, they are mostly at goal, w/o signif. Spikes or lows. She did have 1 CBG in the 200s after a large sandwich. She continues to g to the gym, but did so less consistently during the Holidays. - I suggested to:  Patient Instructions  Please continue:  - Metformin ER 1500 mg with dinner - Januvia 100 mg before breakfast - Glipizide 5 mg but move this before dinner   Please take Fenofibrate consistenty.  Please return in 3-4 months with your sugar log.   - today, HbA1c is 6.7% (better) - continue checking sugars at different times of the day - check 1x a day, rotating checks - advised for yearly eye exams >> she is UTD - Return to clinic in 3-4 mo with sugar log     2. HL - Reviewed latest lipid panel from 08/2018: LDL improved, now lower than 100, triglycerides slightly high, but better: Lab Results  Component Value Date   CHOL 174 09/03/2018   HDL 43.90 09/03/2018   LDLCALC 96 09/03/2018   LDLDIRECT 111.0 01/19/2018   TRIG 166.0 (H) 09/03/2018   CHOLHDL 4 09/03/2018  - not very consistent with fenofibrate >> advised to take daily. Prev. TG were in the 700s, off the med.  3.  Obesity -No significant weight changes since last visit. -At last visit, she was planning to go to the gym and I strongly encouraged her to do so. She is doing this inconsistently now >> will start going more consistently.  She is preparing to go in a cruise in May ([place]).   Philemon Kingdom, MD PhD [facility] Endocrinology

## 2019-01-08 NOTE — Patient Instructions (Addendum)
Please continue:  - Metformin ER 1500 mg with dinner - Januvia 100 mg before breakfast - Glipizide 5 mg but move this before dinner   Please take Fenofibrate consistenty.  Please return in 3-4 months with your sugar log.

## 2019-01-15 ENCOUNTER — Ambulatory Visit
Admission: RE | Admit: 2019-01-15 | Discharge: 2019-01-15 | Disposition: A | Discharge status: Home / Self Care | Payer: No Typology Code available for payment source | Source: Ambulatory Visit | Attending: Hematology and Oncology | Admitting: Hematology and Oncology

## 2019-01-15 DIAGNOSIS — Z853 Personal history of malignant neoplasm of breast: Secondary | ICD-10-CM

## 2019-02-15 ENCOUNTER — Encounter: Payer: No Typology Code available for payment source | Admitting: Family Medicine

## 2019-02-15 ENCOUNTER — Encounter: Payer: Self-pay | Admitting: Family Medicine

## 2019-02-20 MED FILL — SERTRALINE HCL 50 MG TABLET: 50 | 90 days supply | Qty: 90 | Fill #3

## 2019-02-20 MED FILL — TAMOXIFEN CITRATE 20 MG TAB: 20 | 90 days supply | Qty: 90 | Fill #2

## 2019-02-24 NOTE — Progress Notes (Unsigned)
Subjective:   Patient ID: Veronica Rogers, female    DOB: 1961-05-26, 58 y.o.   MRN: [id number]  Veronica Rogers is a pleasant 58 y.o. year old female who presents to clinic today with No chief complaint on file.  on 02/25/2019  HPI:   Health Maintenance  Topic Date Due  . Hepatitis C Screening  1961/06/28  . PNEUMOCOCCAL POLYSACCHARIDE VACCINE AGE 68-64 HIGH RISK  09/22/1962  . HIV Screening  09/23/1975  . URINE MICROALBUMIN  11/23/2015  . FOOT EXAM  07/25/2017  . INFLUENZA VACCINE  07/30/2018  . OPHTHALMOLOGY EXAM  04/11/2019  . HEMOGLOBIN A1C  07/09/2019  . PAP SMEAR-Modifier  10/29/2019  . MAMMOGRAM  01/15/2021  . COLONOSCOPY  05/30/2021  . TETANUS/TDAP  10/28/2027  Has GYN- Dr. Stann Mainland.  Breast CA- followed by Dr. Lindi Adie. Last seen on 08/17/18 Note reviewed. On tamoxifen, zoloft for hot flashes.  He recommends continuing antiestrogen therapy for a total of 10 years, may elect to stop after 7 years. Mammogram  01/15/19.  DM- followed by Dr. Cruzita Lederer. Last seen on 01/08/19.  Note reviewed. Advised to continue Metformin ER 1500 mg with dinner, Januvia 100 mg daily before breakfast, Glipizide 5 mg daily before dinner and to take fenofibrate consistently.  Advised follow up in 3-4 months.  Lab Results  Component Value Date   HGBA1C 6.7 (A) 01/08/2019   Lab Results  Component Value Date   CHOL 174 09/03/2018   HDL 43.90 09/03/2018   LDLCALC 96 09/03/2018   LDLDIRECT 111.0 01/19/2018   TRIG 166.0 (H) 09/03/2018   CHOLHDL 4 09/03/2018      Current Outpatient Medications on File Prior to Visit  Medication Sig Dispense Refill  . ACCU-CHEK FASTCLIX LANCETS MISC Use to test blood sugar 3 times daily 302 each 2  . albuterol (PROVENTIL HFA;VENTOLIN HFA) 108 (90 Base) MCG/ACT inhaler Inhale 1-2 puffs into the lungs every 6 (six) hours as needed for wheezing or shortness of breath. 1 Inhaler 1  . Blood Glucose Monitoring Suppl (ACCU-CHEK GUIDE) w/Device KIT 1 Device by Does  not apply route daily. Use to test blood sugar daily 1 kit 0  . cyclobenzaprine (FLEXERIL) 5 MG tablet Take 1 tablet (5 mg total) by mouth 3 (three) times daily as needed for muscle spasms. 30 tablet 0  . Diclofenac Sodium (PENNSAID) 2 % SOLN Place 1 application onto the skin 2 (two) times daily. 1 Bottle 3  . fenofibrate (TRICOR) 145 MG tablet TAKE 1 TABLET BY MOUTH DAILY. 90 tablet 3  . glipiZIDE (GLUCOTROL) 5 MG tablet Take 1-2 with pm meal daily (Patient taking differently: 5 mg daily before supper. Take 1 TAB with evening meal) 180 tablet 3  . glucose blood (ACCU-CHEK GUIDE) test strip Use to test blood sugar 3 times daily 300 each 2  . Ibuprofen-Famotidine 800-26.6 MG TABS Take 1 tablet by mouth 3 (three) times daily. 90 tablet 3  . JANUVIA 100 MG tablet TAKE 1 TABLET (100 MG TOTAL) BY MOUTH DAILY. 90 tablet 3  . meloxicam (MOBIC) 7.5 MG tablet Take 1 tablet (7.5 mg total) by mouth daily. 30 tablet 5  . metFORMIN (GLUCOPHAGE-XR) 750 MG 24 hr tablet TAKE 2 TABLETS BY MOUTH DAILY AT BEDTIME 180 tablet 3  . Multiple Vitamin (MULTIVITAMIN) tablet Take 1 tablet by mouth daily.    . sertraline (ZOLOFT) 50 MG tablet Take 50 mg by mouth every morning.     . tamoxifen (NOLVADEX) 20 MG tablet Take 1 tablet (  20 mg total) by mouth daily. 90 tablet 3  . traMADol (ULTRAM) 50 MG tablet Take 1 tablet (50 mg total) by mouth every 6 (six) hours as needed. 10 tablet 0   No current facility-administered medications on file prior to visit.     Allergies  Allergen Reactions  . Erythromycin Other (See Comments)    Stomach pain  . Tape Rash    Past Medical History:  Diagnosis Date  . Anxiety   . Breast cancer ([place])   . Contact lens/glasses fitting    wears contacts or glasses  . Depression   . Diabetes ([place]) 11/22/2014  . Hyperlipidemia   . Personal history of chemotherapy   . Personal history of radiation therapy   . Radiation 03/28/14-05/06/14   Left Breast 60 Gy    Past Surgical History:    Procedure Laterality Date  . BREAST BIOPSY  1996   right breast, benign  . BREAST LUMPECTOMY WITH NEEDLE LOCALIZATION AND AXILLARY SENTINEL LYMPH NODE BX Left 10/29/2013   Procedure: BREAST LUMPECTOMY WITH NEEDLE LOCALIZATION AND AXILLARY SENTINEL LYMPH NODE BX;  Surgeon: Rolm Bookbinder, MD;  Location: [facility];  Service: General;  Laterality: Left;  . COLONOSCOPY  05/2011   WNL  . WISDOM TOOTH EXTRACTION      Family History  Problem Relation Age of Onset  . Squamous cell carcinoma Father   . Cancer Father   . Diabetes Father   . Hypertension Mother   . Diabetes Sister     Social History   Socioeconomic History  . Marital status: Married    Spouse name: Not on file  . Number of children: Not on file  . Years of education: Not on file  . Highest education level: Not on file  Occupational History  . Not on file  Social Needs  . Financial resource strain: Not on file  . Food insecurity:    Worry: Not on file    Inability: Not on file  . Transportation needs:    Medical: Not on file    Non-medical: Not on file  Tobacco Use  . Smoking status: Never Smoker  . Smokeless tobacco: Never Used  Substance and Sexual Activity  . Alcohol use: Yes    Comment: 1-2 drinks a month  . Drug use: No  . Sexual activity: Yes  Lifestyle  . Physical activity:    Days per week: Not on file    Minutes per session: Not on file  . Stress: Not on file  Relationships  . Social connections:    Talks on phone: Not on file    Gets together: Not on file    Attends religious service: Not on file    Active member of club or organization: Not on file    Attends meetings of clubs or organizations: Not on file    Relationship status: Not on file  . Intimate partner violence:    Fear of current or ex partner: Not on file    Emotionally abused: Not on file    Physically abused: Not on file    Forced sexual activity: Not on file  Other Topics Concern  . Not on file  Social  History Narrative  . Not on file   The PMH, PSH, Social History, Family History, Medications, and allergies have been reviewed in [facility], and have been updated if relevant.   Review of Systems  Constitutional: Negative.   HENT: Negative.   Eyes: Negative.   Respiratory: Negative.  Cardiovascular: Negative.   Gastrointestinal: Negative.   Endocrine: Negative.   Genitourinary: Negative.   Musculoskeletal: Negative.   Skin: Negative.   Allergic/Immunologic: Negative.   Neurological: Negative.   Hematological: Negative.   Psychiatric/Behavioral: Negative.   All other systems reviewed and are negative.      Objective:    LMP 11/08/2013          Assessment & Plan:   No diagnosis found. No follow-ups on file.

## 2019-02-25 ENCOUNTER — Encounter: Payer: No Typology Code available for payment source | Admitting: Family Medicine

## 2019-02-25 ENCOUNTER — Ambulatory Visit: Payer: No Typology Code available for payment source | Admitting: Family Medicine

## 2019-03-18 MED FILL — ACCU-CHEK GUIDE STRP: 90 days supply | Qty: 300 | Fill #0

## 2019-04-14 ENCOUNTER — Telehealth: Payer: Self-pay | Admitting: Family Medicine

## 2019-04-14 MED FILL — metFORMIN HCL ER 750 MG TB2: 750 | 90 days supply | Qty: 180 | Fill #0

## 2019-04-14 NOTE — Telephone Encounter (Signed)
Called and and left vm for patient. Calling to schedule virtual visit with Dr. Deborra Medina.

## 2019-04-19 ENCOUNTER — Other Ambulatory Visit: Payer: Self-pay | Admitting: Family Medicine

## 2019-04-19 ENCOUNTER — Encounter: Payer: Self-pay | Admitting: Internal Medicine

## 2019-04-19 MED FILL — JANUVIA 100 MG TABLET: 100 | 90 days supply | Qty: 90 | Fill #0

## 2019-04-20 MED FILL — MELOXICAM 7.5 MG TABLET: 7.5 | 30 days supply | Qty: 30 | Fill #0

## 2019-04-23 ENCOUNTER — Encounter: Payer: Self-pay | Admitting: Internal Medicine

## 2019-04-23 ENCOUNTER — Ambulatory Visit (INDEPENDENT_AMBULATORY_CARE_PROVIDER_SITE_OTHER): Payer: No Typology Code available for payment source | Admitting: Internal Medicine

## 2019-04-23 ENCOUNTER — Ambulatory Visit: Payer: No Typology Code available for payment source | Admitting: Internal Medicine

## 2019-04-23 DIAGNOSIS — E1165 Type 2 diabetes mellitus with hyperglycemia: Secondary | ICD-10-CM

## 2019-04-23 DIAGNOSIS — E785 Hyperlipidemia, unspecified: Secondary | ICD-10-CM | POA: Diagnosis not present

## 2019-04-23 NOTE — Progress Notes (Signed)
Patient ID: Veronica Rogers, female   DOB: 07-12-1961, 58 y.o.   MRN: [id number]   Patient location: Home My location: Office  Referring Provider: Lucille Passy, MD  I connected with the patient on 04/23/19 at  1:05 PM EDT by a video enabled telemedicine application and verified that I am speaking with the correct person.   I discussed the limitations of evaluation and management by telemedicine and the availability of in person appointments. The patient expressed understanding and agreed to proceed.   Details of the encounter are shown below.  HPI: Veronica Rogers is a 58 y.o.-year-old femaleemale, presenting presenting for f/u for DM2, dx in 2015 (after steroids), non-insulin-dependent, uncontrolled, without long term complications. Last visit 3.5 months ago.  Last hemoglobin A1c was: Lab Results  Component Value Date   HGBA1C 6.7 (A) 01/08/2019   HGBA1C 7.2 (A) 09/03/2018   HGBA1C 7.4 04/24/2018   Pt is on a regimen of:  - Metformin ER 1500 mg with dinner - Januvia 100 mg before breakfast - Glipizide 5 mg before dinner She was on insulin during ChTx. She was on regular metformin >> GI upset (diarrhea). She has frequent urination.  Pt checks her sugars 1-3 times a day: - am:  127-137 >> 123-134 >> 130s - 2h after b'fast:95-101 >> 120 >> n/c - before lunch: 90-110 >> 98-169 >> n/c >> 89 - 2h after lunch:n/c >> 140-152 >> n/c - before dinner: n/c >> 134, 135 >> n/c - 2h after dinner:107-211 >> 152 >> n/c >> 160s - bedtime: n/c   - nighttime: 95-99 >> 106 >> 85, 89, 98, 120 Lowest sugar was 93 >> 106 >> 85;  she has hypoglycemia awareness in the 70s. Highest sugar was  211 >> 229 (large sandwich) >> 160s.  Glucometer: True test >> AccuChek guide  Pt's meals are: - Breakfast: cereals or 2 eggs or skips - 10:30 am: PB crackers - Lunch: leftovers from home: sandwich; soup; meat + veggies; chinese; salad - Dinner: eats out most dinners! She is riding her bicycle and also  walking more since she is staying home more due to the coronavirus pandemic.  -No CKD, last BUN/creatinine:  Lab Results  Component Value Date   BUN 17 01/19/2018   CREATININE 0.73 01/19/2018   -+ HL; last set of lipids: Lab Results  Component Value Date   CHOL 174 09/03/2018   HDL 43.90 09/03/2018   LDLCALC 96 09/03/2018   LDLDIRECT 111.0 01/19/2018   TRIG 166.0 (H) 09/03/2018   CHOLHDL 4 09/03/2018  She is not on a statin.  She continues on fenofibrate. - last eye exam was in 02/2018: No DR.  Dr. Katy Fitch. -No numbness and tingling in her feet.  She has a history of plantar fasciitis.  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 On Tamoxifen for 5 years >> she will finish the course in 2020.  ROS: Constitutional: no weight gain/no weight loss, no fatigue, no subjective hyperthermia, no subjective hypothermia Eyes: no blurry vision, no xerophthalmia ENT: no sore throat, no nodules palpated in neck, no dysphagia, no odynophagia, no hoarseness Cardiovascular: no CP/no SOB/no palpitations/no leg swelling Respiratory: no cough/no SOB/no wheezing Gastrointestinal: no N/no V/no D/no C/no acid reflux Musculoskeletal: no muscle aches/no joint aches Skin: no rashes, no hair loss Neurological: no tremors/no numbness/no tingling/no dizziness  I reviewed pt's medications, allergies, PMH, social hx, family hx, and changes were documented in the history of present illness. Otherwise, unchanged from my initial visit note.  Past Medical History:  Diagnosis Date  . Anxiety   . Breast cancer ([place])   . Contact lens/glasses fitting    wears contacts or glasses  . Depression   . Diabetes ([place]) 11/22/2014  . Hyperlipidemia   . Personal history of chemotherapy   . Personal history of radiation therapy   . Radiation 03/28/14-05/06/14   Left Breast 60 Gy   Past Surgical History:  Procedure Laterality Date  . BREAST BIOPSY  1996   right breast, benign  . BREAST LUMPECTOMY WITH NEEDLE LOCALIZATION AND AXILLARY  SENTINEL LYMPH NODE BX Left 10/29/2013   Procedure: BREAST LUMPECTOMY WITH NEEDLE LOCALIZATION AND AXILLARY SENTINEL LYMPH NODE BX;  Surgeon: Rolm Bookbinder, MD;  Location: [facility];  Service: General;  Laterality: Left;  . COLONOSCOPY  05/2011   WNL  . WISDOM TOOTH EXTRACTION     Social History   Social History  . Marital Status: Married    Spouse Name: N/A  . Number of Children: 2   Occupational History  . [employer] - support rep   Social History Main Topics  . Smoking status: Never Smoker   . Smokeless tobacco: Never Used  . Alcohol Use: Yes     Comment: 1-2 drinks a month  . Drug Use: No   Current Outpatient Medications on File Prior to Visit  Medication Sig Dispense Refill  . ACCU-CHEK FASTCLIX LANCETS MISC Use to test blood sugar 3 times daily 302 each 2  . albuterol (PROVENTIL HFA;VENTOLIN HFA) 108 (90 Base) MCG/ACT inhaler Inhale 1-2 puffs into the lungs every 6 (six) hours as needed for wheezing or shortness of breath. 1 Inhaler 1  . Blood Glucose Monitoring Suppl (ACCU-CHEK GUIDE) w/Device KIT 1 Device by Does not apply route daily. Use to test blood sugar daily 1 kit 0  . cyclobenzaprine (FLEXERIL) 5 MG tablet Take 1 tablet (5 mg total) by mouth 3 (three) times daily as needed for muscle spasms. 30 tablet 0  . Diclofenac Sodium (PENNSAID) 2 % SOLN Place 1 application onto the skin 2 (two) times daily. 1 Bottle 3  . fenofibrate (TRICOR) 145 MG tablet TAKE 1 TABLET BY MOUTH DAILY. 90 tablet 3  . glipiZIDE (GLUCOTROL) 5 MG tablet Take 1-2 with pm meal daily (Patient taking differently: 5 mg daily before supper. Take 1 TAB with evening meal) 180 tablet 3  . glucose blood (ACCU-CHEK GUIDE) test strip Use to test blood sugar 3 times daily 300 each 2  . Ibuprofen-Famotidine 800-26.6 MG TABS Take 1 tablet by mouth 3 (three) times daily. 90 tablet 3  . JANUVIA 100 MG tablet TAKE 1 TABLET (100 MG TOTAL) BY MOUTH DAILY. 90 tablet 3  .  meloxicam (MOBIC) 7.5 MG tablet TAKE 1 TABLET (7.5 MG TOTAL) BY MOUTH DAILY. 30 tablet 5  . metFORMIN (GLUCOPHAGE-XR) 750 MG 24 hr tablet TAKE 2 TABLETS BY MOUTH DAILY AT BEDTIME 180 tablet 3  . Multiple Vitamin (MULTIVITAMIN) tablet Take 1 tablet by mouth daily.    . sertraline (ZOLOFT) 50 MG tablet Take 50 mg by mouth every morning.     . tamoxifen (NOLVADEX) 20 MG tablet Take 1 tablet (20 mg total) by mouth daily. 90 tablet 3  . traMADol (ULTRAM) 50 MG tablet Take 1 tablet (50 mg total) by mouth every 6 (six) hours as needed. 10 tablet 0   No current facility-administered medications on file prior to visit.    Allergies  Allergen Reactions  . Erythromycin Other (See Comments)  Stomach pain  . Tape Rash   Family History  Problem Relation Age of Onset  . Squamous cell carcinoma Father   . Cancer Father   . Diabetes Father   . Hypertension Mother   . Diabetes Sister    PE: LMP 11/08/2013  There is no height or weight on file to calculate BMI. Wt Readings from Last 3 Encounters:  01/08/19 198 lb (89.8 kg)  11/24/18 199 lb (90.3 kg)  09/03/18 198 lb (89.8 kg)   Constitutional:  in NAD  The physical exam was not performed (virtual visit).  ASSESSMENT: 1. DM2, non-insulin-dependent, uncontrolled, without long term complications, but with hyperglycemia  2. HL  3.  Obesity  PLAN:  1. Patient with longstanding, uncontrolled, type 2 diabetes, on metformin, DPP 4 inhibitor, and also she repeatedly refused a GLP-1 receptor agonist or any other injectables in the past.  Her sugars improved previously with changes in her diet she was also planning to restart going to the gym before last visit.  At that time, she was going occasionally, but not consistently.  Also, before last visit, she was out of metformin for 3 weeks and her sugars in the morning stayed in the 170s.  After she started to take metformin consistently, they improved.  In fact, at last visit, she had sugars mostly at  goal, without significant hyperglycemic spikes or hypoglycemic episodes.  She only had one CBG in the 200s after a large meal.  We did not make any changes in her regimen then -At this visit, she tells me that she is now exercising more consistently and she is home more and has more time.  She is cycling and walking.  Her sugars are at the upper limit of normal in the morning, in the 130s, and at goal in the rest of the day.  However, during the night, after she takes glipizide before dinner, she drops her sugars to the 80s.  We discussed about reducing the dose of glipizide for a smaller meal since I suspect that some of her higher blood sugars in the morning may be due to bounce back from a blood sugar of 80 during the night.  She agrees to do so.  Also discussed at the next visit, we may switch from the DPP 4 inhibitor to an oral GLP-1 receptor agonist. - I suggested to:  Patient Instructions  Please continue:  - Metformin ER 1500 mg with dinner - Januvia 100 mg before breakfast  Change: - Glipizide to 2.5-5 mg before dinner depending on the size of the meal  Please return in 4 months with your sugar log.   -We will check another HbA1c when she returns to the clinic - continue checking sugars at different times of the day - check 1x a day, rotating checks - advised for yearly eye exams >> she is over due by 1 month - Return to clinic in 3-4 mo with sugar log     2. HL - Reviewed latest lipid panel from 08/2018: LDL improved, lower than 100, rest of the fractions at goal with the exception of slightly high triglycerides. Lab Results  Component Value Date   CHOL 174 09/03/2018   HDL 43.90 09/03/2018   LDLCALC 96 09/03/2018   LDLDIRECT 111.0 01/19/2018   TRIG 166.0 (H) 09/03/2018   CHOLHDL 4 09/03/2018  -She was noncompliant with fenofibrate at last visit we discussed about the importance of using this consistently since her triglycerides were 700s after medication in the  past.  3.   Obesity - she exercises more consistently - stable weight  Philemon Kingdom, MD PhD [facility]

## 2019-04-23 NOTE — Patient Instructions (Addendum)
Please continue:  - Metformin ER 1500 mg with dinner - Januvia 100 mg before breakfast  Change: - Glipizide to 2.5-5 mg before dinner depending on the size of the meal  Please return in 4 months with your sugar log.

## 2019-04-23 NOTE — Progress Notes (Signed)
LM 3X to pre-room and confirm link.

## 2019-05-17 MED FILL — TAMOXIFEN CITRATE 20 MG TAB: 20 | 90 days supply | Qty: 90 | Fill #0

## 2019-05-17 MED FILL — FENOFIBRATE 145 MG TABS: 145 | 90 days supply | Qty: 90 | Fill #0

## 2019-05-27 ENCOUNTER — Telehealth: Payer: Self-pay

## 2019-05-27 NOTE — Telephone Encounter (Signed)
LOV 04/23/19. Per Dr. Cruzita Lederer, f/u appt needed in 4 mo. LVM requesting returned call.

## 2019-06-08 MED FILL — SERTRALINE HCL 50 MG TABLET: 50 | 90 days supply | Qty: 90 | Fill #0

## 2019-06-11 LAB — HM DIABETES EYE EXAM

## 2019-06-14 ENCOUNTER — Telehealth: Payer: Self-pay | Admitting: Family Medicine

## 2019-06-14 NOTE — Telephone Encounter (Signed)
Tried to call pt about message she sent in:  Appointment Request From: Veronica Rogers  With Provider: Arnette Norris, MD [[facility]]  Preferred Date Range: 06/14/2019 - 06/18/2019 Preferred Times: Monday Afternoon, Tuesday Afternoon, Wednesday Afternoon, Thursday Afternoon, Friday Afternoon  Reason for visit: Annual Physical  Comments: 4:00 or after Annual Physical/fasting labs on day of visit  Had to leave message

## 2019-07-01 ENCOUNTER — Encounter: Payer: Self-pay | Admitting: Family Medicine

## 2019-07-08 ENCOUNTER — Encounter: Payer: Self-pay | Admitting: Family Medicine

## 2019-07-22 ENCOUNTER — Encounter: Payer: Self-pay | Admitting: Family Medicine

## 2019-07-22 NOTE — Progress Notes (Signed)
[facility]/thx dmf

## 2019-07-28 MED FILL — JANUVIA 100 MG TABLET: 100 | 90 days supply | Qty: 90 | Fill #0

## 2019-07-28 MED FILL — metFORMIN HCL ER 750 MG TB2: 750 | 30 days supply | Qty: 60 | Fill #0

## 2019-07-28 MED FILL — ACCU-CHEK GUIDE STRP: 90 days supply | Qty: 300 | Fill #1

## 2019-07-28 MED FILL — MELOXICAM 7.5 MG TABLET: 7.5 | 30 days supply | Qty: 30 | Fill #0

## 2019-08-06 ENCOUNTER — Telehealth: Payer: Self-pay | Admitting: Hematology and Oncology

## 2019-08-06 ENCOUNTER — Encounter: Payer: Self-pay | Admitting: Hematology and Oncology

## 2019-08-06 NOTE — Telephone Encounter (Signed)
I left a message regarding video visit  °

## 2019-08-06 NOTE — Assessment & Plan Note (Addendum)
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 Occasional hot flashes  We discussed optimal duration of tamoxifen therapy.  I recommended continuation of antiestrogen therapy for a total of 10 years.  Patient was concerned about long-term side effects of tamoxifen.  We may elect to stop after 7.  We also discussed the role of breast cancer index in determining who needs extended adjuvant therapy.  At this point she is planning on staying on tamoxifen.  Breast cancer surveillance: 1. Mammogram done 01/15/2019 showed dystrophic calcifications biopsy was benign, breast density category B 2. Breast exam done by her gynecologist benign  Return to clinic in 1 year for follow-up

## 2019-08-12 ENCOUNTER — Telehealth: Payer: Self-pay | Admitting: Hematology and Oncology

## 2019-08-12 NOTE — Progress Notes (Signed)
HEMATOLOGY-ONCOLOGY [facility] VIDEO VISIT PROGRESS NOTE  I connected with Veronica Rogers on 08/13/2019 at 11:15 AM EDT by MyChart video conference and verified that I am speaking with the correct person using two identifiers.  I discussed the limitations, risks, security and privacy concerns of performing an evaluation and management service by MyChart and the availability of in person appointments.  I also discussed with the patient that there may be a patient responsible charge related to this service. The patient expressed understanding and agreed to proceed.  Patient's Location: Home Physician Location: Clinic  CHIEF COMPLIANT: Follow-up of left breast cancer on tamoxifen therapy  INTERVAL HISTORY: Veronica Rogers is a 58 y.o. female with above-mentioned history of left breast cancer treated with lumpectomy, followed by adjuvant chemotherapy, and radiation, and who is currently on antiestrogen therapy with tamoxifen. I last saw her a year ago. Mammogram on 01/15/19 showed no evidence of malignancy. She presents over MyChart today for annual follow-up.   Oncology History  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[place])  10/29/2013 Surgery   left lumpectomy: 0.9 cm invasive ductal carcinoma, grade 2, ER/PR positive HER-2 negative, 1 sentinel node positive for micrometastatic, Oncotype DX recurrence score 29, 17% ROR   12/07/2013 - 02/08/2014 Chemotherapy   Taxotere and Cytoxan 4 adjuvant chemotherapy   03/30/2014 - 05/06/2014 Radiation Therapy   adjuvant radiation therapy   05/20/2014 -  Anti-estrogen oral therapy   tamoxifen 20 mg daily     REVIEW OF SYSTEMS:   Constitutional: Denies fevers, chills or abnormal weight loss Eyes: Denies blurriness of vision Ears, nose, mouth, throat, and face: Denies mucositis or sore throat Respiratory: Denies cough, dyspnea or wheezes Cardiovascular: Denies palpitation, chest discomfort Gastrointestinal:  Denies nausea, heartburn or  change in bowel habits Skin: Denies abnormal skin rashes Lymphatics: Denies new lymphadenopathy or easy bruising Neurological:Denies numbness, tingling or new weaknesses Behavioral/Psych: Mood is stable, no new changes  Extremities: No lower extremity edema Breast: denies any pain or lumps or nodules in either breasts All other systems were reviewed with the patient and are negative.  Observations/Objective:  There were no vitals filed for this visit. There is no height or weight on file to calculate BMI.  I have reviewed the data as listed CMP Latest Ref Rng & Units 01/19/2018 08/26/2017 07/24/2016  Glucose 70 - 99 mg/dL 104(H) 124(H) 111(H)  BUN 6 - 23 mg/dL '17 24 20  '$ Creatinine 0.40 - 1.20 mg/dL 0.73 0.90 0.67  Sodium 135 - 145 mEq/L 140 140 140  Potassium 3.5 - 5.1 mEq/L 4.6 4.0 4.0  Chloride 96 - 112 mEq/L 102 102 102  CO2 19 - 32 mEq/L '28 22 28  '$ Calcium 8.4 - 10.5 mg/dL 10.3 9.9 9.9  Total Protein 6.0 - 8.3 g/dL 7.0 6.7 7.1  Total Bilirubin 0.2 - 1.2 mg/dL 0.5 0.4 0.5  Alkaline Phos 39 - 117 U/L 33(L) 37 34(L)  AST 0 - 37 U/L '19 17 18  '$ ALT 0 - 35 U/L '15 16 19    '$ Lab Results  Component Value Date   WBC 7.7 01/19/2018   HGB 12.3 01/19/2018   HCT 37.9 01/19/2018   MCV 82.4 01/19/2018   PLT 223.0 01/19/2018   NEUTROABS 3.7 01/19/2018      Assessment Plan:  Breast cancer of upper-inner quadrant of left female breast 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 Occasional hot flashes  We discussed optimal duration of tamoxifen therapy.  I recommended continuation of antiestrogen therapy for a total of 10 years.  Patient was concerned about  long-term side effects of tamoxifen.  We may elect to stop after 7.  We also discussed the role of breast cancer index in determining who needs extended adjuvant therapy.  At this point she is planning on staying on tamoxifen.  Breast cancer surveillance: 1. Mammogram done 01/15/2019 showed dystrophic calcifications biopsy was benign, breast density category B 2. Breast exam done by her gynecologist benign  Return to clinic in 1 year for follow-up    I discussed the assessment and treatment plan with the patient. The patient was provided an opportunity to ask questions and all were answered. The patient agreed with the plan and demonstrated an understanding of the instructions. The patient was advised to call back or seek an in-person evaluation if the symptoms worsen or if the condition fails to improve as anticipated.   I provided 15 minutes of face-to-face MyChart video visit time during this encounter.    Rulon Eisenmenger, MD 08/13/2019   I, Molly Dorshimer, am acting as scribe for Nicholas Lose, MD.  I have reviewed the above documentation for accuracy and completeness, and I agree with the above.

## 2019-08-12 NOTE — Telephone Encounter (Signed)
I tried to call patient back a 2nd time left a message

## 2019-08-13 ENCOUNTER — Inpatient Hospital Stay
Payer: No Typology Code available for payment source | Attending: Hematology and Oncology | Admitting: Hematology and Oncology

## 2019-08-13 DIAGNOSIS — Z170 Estrogen receptor positive status [ER+]: Secondary | ICD-10-CM | POA: Diagnosis not present

## 2019-08-13 DIAGNOSIS — C50212 Malignant neoplasm of upper-inner quadrant of left female breast: Secondary | ICD-10-CM

## 2019-08-13 DIAGNOSIS — Z79810 Long term (current) use of selective estrogen receptor modulators (SERMs): Secondary | ICD-10-CM | POA: Insufficient documentation

## 2019-08-13 DIAGNOSIS — Z9221 Personal history of antineoplastic chemotherapy: Secondary | ICD-10-CM | POA: Diagnosis not present

## 2019-08-13 DIAGNOSIS — Z923 Personal history of irradiation: Secondary | ICD-10-CM | POA: Diagnosis not present

## 2019-08-13 DIAGNOSIS — R232 Flushing: Secondary | ICD-10-CM | POA: Diagnosis not present

## 2019-08-13 MED ORDER — TAMOXIFEN CITRATE 20 MG PO TABS: 20.0000 mg | ORAL_TABLET | Freq: Every day | ORAL | 3 refills | Status: DC

## 2019-08-13 MED FILL — TAMOXIFEN 20 MG TABLET: 20 | 90 days supply | Qty: 90 | Fill #0

## 2019-08-24 ENCOUNTER — Telehealth: Payer: Self-pay | Admitting: Internal Medicine

## 2019-08-24 MED ORDER — METFORMIN HCL ER 500 MG PO TB24: ORAL_TABLET | ORAL | 2 refills | Status: DC

## 2019-08-24 MED FILL — METFORMIN HCL ER 500 MG TAB: 500 | 90 days supply | Qty: 270 | Fill #0

## 2019-08-24 NOTE — Telephone Encounter (Signed)
RX sent

## 2019-08-24 NOTE — Telephone Encounter (Signed)
Elmo Putt called from [facility] regarding pt medication of metFORMIN (GLUCOPHAGE-XR) 750 MG 24 hr tablet has been recalled not able to get in. So she states pt will need to be switch ER 500. Please advise?  Call pharmacy is [phone]

## 2019-08-24 NOTE — Telephone Encounter (Signed)
Okay to send this dose and she needs to take 3 tablets with dinner.

## 2019-09-06 ENCOUNTER — Encounter: Payer: No Typology Code available for payment source | Admitting: Family Medicine

## 2019-09-12 NOTE — Progress Notes (Signed)
Subjective:   Patient ID: Veronica Rogers, female    DOB: Apr 27, 1961, 58 y.o.   MRN: [id number]  Veronica Rogers is a pleasant 58 y.o. year old female who presents to clinic today with Annual Exam  on 09/13/2019  HPI:  Health Maintenance  Topic Date Due  . PNEUMOCOCCAL POLYSACCHARIDE VACCINE AGE 59-64 HIGH RISK  09/22/1962  . HIV Screening  09/23/1975  . URINE MICROALBUMIN  11/23/2015  . FOOT EXAM  07/25/2017  . HEMOGLOBIN A1C  07/09/2019  . INFLUENZA VACCINE  07/31/2019  . PAP SMEAR-Modifier  10/29/2019  . OPHTHALMOLOGY EXAM  06/10/2020  . MAMMOGRAM  01/15/2021  . COLONOSCOPY  05/30/2021  . TETANUS/TDAP  10/28/2027  . Hepatitis C Screening  Completed   Has gynecologist, Dr. Stann Mainland- per patient UTD on mammogram.  Breast CA- followed by Dr. Lindi Adie. Last seen by him via video visit on 08/13/19. Note reviewed. On tamoxifen, zoloft for hot flashes.   Depression screen [facility] 2/9 09/13/2019 11/26/2016 08/22/2016 05/16/2016 01/26/2016  Decreased Interest 0 0 0 0 0  Down, Depressed, Hopeless 0 0 0 0 0  PHQ - 2 Score 0 0 0 0 0  Some recent data might be hidden     He recommends continuing antiestrogen therapy for a total of 10 years, may elect to stop after 7 years. He also discussed the role of breast cancer index in determining who needs extended adjuvant therapy. At this point she is planning on staying on tamoxifen. Mammogram  01/15/19. He advised follow up in 1 year.  DM- followed by Dr. Cruzita Lederer. Last seen by her via video visit on 04/23/19. Note reviewed. Her advise:  Patient Instructions  Please continue:  - Metformin ER 1500 mg with dinner - Januvia 100 mg before breakfast  Change: - Glipizide to 2.5-5 mg before dinner depending on the size of the meal  Please return in 4 months with your sugar log.   Lab Results  Component Value Date   HGBA1C 6.7 (A) 01/08/2019   The 10-year ASCVD risk score Mikey Bussing DC Jr., et al., 2013) is: 4.9%   Values used to calculate the  score:     Age: 58 years     Sex: Female     Is Non-Hispanic African American: No     Diabetic: Yes     Tobacco smoker: No     Systolic Blood Pressure: 287 mmHg     Is BP treated: No     HDL Cholesterol: 43.9 mg/dL     Total Cholesterol: 174 mg/dL  HLD- Currently on Tricor 145 mg daily  Lab Results  Component Value Date   CHOL 174 09/03/2018   HDL 43.90 09/03/2018   LDLCALC 96 09/03/2018   LDLDIRECT 111.0 01/19/2018   TRIG 166.0 (H) 09/03/2018   CHOLHDL 4 09/03/2018   Current Outpatient Medications on File Prior to Visit  Medication Sig Dispense Refill  . ACCU-CHEK FASTCLIX LANCETS MISC Use to test blood sugar 3 times daily 302 each 2  . albuterol (PROVENTIL HFA;VENTOLIN HFA) 108 (90 Base) MCG/ACT inhaler Inhale 1-2 puffs into the lungs every 6 (six) hours as needed for wheezing or shortness of breath. 1 Inhaler 1  . Blood Glucose Monitoring Suppl (ACCU-CHEK GUIDE) w/Device KIT 1 Device by Does not apply route daily. Use to test blood sugar daily 1 kit 0  . cyclobenzaprine (FLEXERIL) 5 MG tablet Take 1 tablet (5 mg total) by mouth 3 (three) times daily as needed for muscle spasms. 30 tablet  0  . Diclofenac Sodium (PENNSAID) 2 % SOLN Place 1 application onto the skin 2 (two) times daily. 1 Bottle 3  . fenofibrate (TRICOR) 145 MG tablet TAKE 1 TABLET BY MOUTH DAILY. 90 tablet 3  . glipiZIDE (GLUCOTROL) 5 MG tablet Take 1-2 with pm meal daily (Patient taking differently: 5 mg daily before supper. Take 1 TAB with evening meal) 180 tablet 3  . glucose blood (ACCU-CHEK GUIDE) test strip Use to test blood sugar 3 times daily 300 each 2  . Ibuprofen-Famotidine 800-26.6 MG TABS Take 1 tablet by mouth 3 (three) times daily. 90 tablet 3  . JANUVIA 100 MG tablet TAKE 1 TABLET (100 MG TOTAL) BY MOUTH DAILY. 90 tablet 3  . meloxicam (MOBIC) 7.5 MG tablet TAKE 1 TABLET (7.5 MG TOTAL) BY MOUTH DAILY. 30 tablet 5  . metFORMIN (GLUCOPHAGE-XR) 500 MG 24 hr tablet Take 3 tablets daily with dinner.  270 tablet 2  . metFORMIN (GLUCOPHAGE-XR) 750 MG 24 hr tablet TAKE 2 TABLETS BY MOUTH DAILY AT BEDTIME 180 tablet 3  . Multiple Vitamin (MULTIVITAMIN) tablet Take 1 tablet by mouth daily.    . sertraline (ZOLOFT) 50 MG tablet Take 50 mg by mouth every morning.     . tamoxifen (NOLVADEX) 20 MG tablet Take 1 tablet (20 mg total) by mouth daily. 90 tablet 3  . traMADol (ULTRAM) 50 MG tablet Take 1 tablet (50 mg total) by mouth every 6 (six) hours as needed. 10 tablet 0   No current facility-administered medications on file prior to visit.     Allergies  Allergen Reactions  . Erythromycin Other (See Comments)    Stomach pain  . Tape Rash    Past Medical History:  Diagnosis Date  . Anxiety   . Breast cancer ([place])   . Contact lens/glasses fitting    wears contacts or glasses  . Depression   . Diabetes ([place]) 11/22/2014  . Hyperlipidemia   . Personal history of chemotherapy   . Personal history of radiation therapy   . Radiation 03/28/14-05/06/14   Left Breast 60 Gy    Past Surgical History:  Procedure Laterality Date  . BREAST BIOPSY  1996   right breast, benign  . BREAST LUMPECTOMY WITH NEEDLE LOCALIZATION AND AXILLARY SENTINEL LYMPH NODE BX Left 10/29/2013   Procedure: BREAST LUMPECTOMY WITH NEEDLE LOCALIZATION AND AXILLARY SENTINEL LYMPH NODE BX;  Surgeon: Rolm Bookbinder, MD;  Location: [facility];  Service: General;  Laterality: Left;  . COLONOSCOPY  05/2011   WNL  . WISDOM TOOTH EXTRACTION      Family History  Problem Relation Age of Onset  . Squamous cell carcinoma Father   . Cancer Father   . Diabetes Father   . Hypertension Mother   . Diabetes Sister     Social History   Socioeconomic History  . Marital status: Married    Spouse name: Not on file  . Number of children: Not on file  . Years of education: Not on file  . Highest education level: Not on file  Occupational History  . Not on file  Social Needs  . Financial resource strain: Not  on file  . Food insecurity    Worry: Not on file    Inability: Not on file  . Transportation needs    Medical: Not on file    Non-medical: Not on file  Tobacco Use  . Smoking status: Never Smoker  . Smokeless tobacco: Never Used  Substance and Sexual Activity  .  Alcohol use: Yes    Comment: 1-2 drinks a month  . Drug use: No  . Sexual activity: Yes  Lifestyle  . Physical activity    Days per week: Not on file    Minutes per session: Not on file  . Stress: Not on file  Relationships  . Social Herbalist on phone: Not on file    Gets together: Not on file    Attends religious service: Not on file    Active member of club or organization: Not on file    Attends meetings of clubs or organizations: Not on file    Relationship status: Not on file  . Intimate partner violence    Fear of current or ex partner: Not on file    Emotionally abused: Not on file    Physically abused: Not on file    Forced sexual activity: Not on file  Other Topics Concern  . Not on file  Social History Narrative  . Not on file   The PMH, PSH, Social History, Family History, Medications, and allergies have been reviewed in [facility], and have been updated if relevant.  Current Outpatient Medications on File Prior to Visit  Medication Sig Dispense Refill  . ACCU-CHEK FASTCLIX LANCETS MISC Use to test blood sugar 3 times daily 302 each 2  . albuterol (PROVENTIL HFA;VENTOLIN HFA) 108 (90 Base) MCG/ACT inhaler Inhale 1-2 puffs into the lungs every 6 (six) hours as needed for wheezing or shortness of breath. 1 Inhaler 1  . Blood Glucose Monitoring Suppl (ACCU-CHEK GUIDE) w/Device KIT 1 Device by Does not apply route daily. Use to test blood sugar daily 1 kit 0  . cyclobenzaprine (FLEXERIL) 5 MG tablet Take 1 tablet (5 mg total) by mouth 3 (three) times daily as needed for muscle spasms. 30 tablet 0  . Diclofenac Sodium (PENNSAID) 2 % SOLN Place 1 application onto the skin 2 (two) times daily. 1 Bottle 3   . fenofibrate (TRICOR) 145 MG tablet TAKE 1 TABLET BY MOUTH DAILY. 90 tablet 3  . glipiZIDE (GLUCOTROL) 5 MG tablet Take 1-2 with pm meal daily (Patient taking differently: 5 mg daily before supper. Take 1 TAB with evening meal) 180 tablet 3  . glucose blood (ACCU-CHEK GUIDE) test strip Use to test blood sugar 3 times daily 300 each 2  . Ibuprofen-Famotidine 800-26.6 MG TABS Take 1 tablet by mouth 3 (three) times daily. 90 tablet 3  . JANUVIA 100 MG tablet TAKE 1 TABLET (100 MG TOTAL) BY MOUTH DAILY. 90 tablet 3  . meloxicam (MOBIC) 7.5 MG tablet TAKE 1 TABLET (7.5 MG TOTAL) BY MOUTH DAILY. 30 tablet 5  . metFORMIN (GLUCOPHAGE-XR) 500 MG 24 hr tablet Take 3 tablets daily with dinner. 270 tablet 2  . metFORMIN (GLUCOPHAGE-XR) 750 MG 24 hr tablet TAKE 2 TABLETS BY MOUTH DAILY AT BEDTIME 180 tablet 3  . Multiple Vitamin (MULTIVITAMIN) tablet Take 1 tablet by mouth daily.    . sertraline (ZOLOFT) 50 MG tablet Take 50 mg by mouth every morning.     . tamoxifen (NOLVADEX) 20 MG tablet Take 1 tablet (20 mg total) by mouth daily. 90 tablet 3  . traMADol (ULTRAM) 50 MG tablet Take 1 tablet (50 mg total) by mouth every 6 (six) hours as needed. 10 tablet 0   No current facility-administered medications on file prior to visit.     Allergies  Allergen Reactions  . Erythromycin Other (See Comments)    Stomach pain  .  Tape Rash    Past Medical History:  Diagnosis Date  . Anxiety   . Breast cancer ([place])   . Contact lens/glasses fitting    wears contacts or glasses  . Depression   . Diabetes ([place]) 11/22/2014  . Hyperlipidemia   . Personal history of chemotherapy   . Personal history of radiation therapy   . Radiation 03/28/14-05/06/14   Left Breast 60 Gy    Past Surgical History:  Procedure Laterality Date  . BREAST BIOPSY  1996   right breast, benign  . BREAST LUMPECTOMY WITH NEEDLE LOCALIZATION AND AXILLARY SENTINEL LYMPH NODE BX Left 10/29/2013   Procedure: BREAST LUMPECTOMY WITH NEEDLE  LOCALIZATION AND AXILLARY SENTINEL LYMPH NODE BX;  Surgeon: Rolm Bookbinder, MD;  Location: [facility];  Service: General;  Laterality: Left;  . COLONOSCOPY  05/2011   WNL  . WISDOM TOOTH EXTRACTION      Family History  Problem Relation Age of Onset  . Squamous cell carcinoma Father   . Cancer Father   . Diabetes Father   . Hypertension Mother   . Diabetes Sister     Social History   Socioeconomic History  . Marital status: Married    Spouse name: Not on file  . Number of children: Not on file  . Years of education: Not on file  . Highest education level: Not on file  Occupational History  . Not on file  Social Needs  . Financial resource strain: Not on file  . Food insecurity    Worry: Not on file    Inability: Not on file  . Transportation needs    Medical: Not on file    Non-medical: Not on file  Tobacco Use  . Smoking status: Never Smoker  . Smokeless tobacco: Never Used  Substance and Sexual Activity  . Alcohol use: Yes    Comment: 1-2 drinks a month  . Drug use: No  . Sexual activity: Yes  Lifestyle  . Physical activity    Days per week: Not on file    Minutes per session: Not on file  . Stress: Not on file  Relationships  . Social Herbalist on phone: Not on file    Gets together: Not on file    Attends religious service: Not on file    Active member of club or organization: Not on file    Attends meetings of clubs or organizations: Not on file    Relationship status: Not on file  . Intimate partner violence    Fear of current or ex partner: Not on file    Emotionally abused: Not on file    Physically abused: Not on file    Forced sexual activity: Not on file  Other Topics Concern  . Not on file  Social History Narrative  . Not on file    Review of Systems  Constitutional: Negative.   HENT: Negative.   Eyes: Negative.   Respiratory: Negative.   Cardiovascular: Negative.   Gastrointestinal: Negative.   Endocrine:  Negative.   Genitourinary: Negative.   Musculoskeletal: Negative.   Allergic/Immunologic: Negative.   Neurological: Negative.   Hematological: Negative.   Psychiatric/Behavioral: Negative.   All other systems reviewed and are negative.      Objective:    BP 122/78 (BP Location: Right Arm, Patient Position: Sitting, Cuff Size: Normal)   Pulse 71   Temp 98.2 F (36.8 C) (Oral)   Ht '5\' 8"'$  (1.727 m)   Wt  201 lb 6.4 oz (91.4 kg)   LMP 11/08/2013   SpO2 99%   BMI 30.62 kg/m    Physical Exam    General:  Well-developed,well-nourished,in no acute distress; alert,appropriate and cooperative throughout examination Head:  normocephalic and atraumatic.   Eyes:  vision grossly intact, PERRL Ears:  R ear normal and L ear normal externally, TMs clear bilaterally Nose:  no external deformity.   Mouth:  good dentition.   Neck:  No deformities, masses, or tenderness noted.  Lungs:  Normal respiratory effort, chest expands symmetrically. Lungs are clear to auscultation, no crackles or wheezes. Heart:  Normal rate and regular rhythm. S1 and S2 normal without gallop, murmur, click, rub or other extra sounds. Abdomen:  Bowel sounds positive,abdomen soft and non-tender without masses, organomegaly or hernias noted. Msk:  No deformity or scoliosis noted of thoracic or lumbar spine.   Extremities:  No clubbing, cyanosis, edema, or deformity noted with normal full range of motion of all joints.   Neurologic:  alert & oriented X3 and gait normal.   Skin:  Intact without suspicious lesions or rashes Cervical Nodes:  No lymphadenopathy noted Axillary Nodes:  No palpable lymphadenopathy Psych:  Cognition and judgment appear intact. Alert and cooperative with normal attention span and concentration. No apparent delusions, illusions, hallucinations      Assessment & Plan:   Well woman exam without gynecological exam  Malignant neoplasm of upper-inner quadrant of left breast in female, estrogen  receptor positive ([place])  Type 2 diabetes mellitus with hyperglycemia, without long-term current use of insulin ([place]) - Plan: Microalbumin / creatinine urine ratio, Hemoglobin A1c  Hyperlipidemia, unspecified hyperlipidemia type - Plan: Lipid panel, Comprehensive metabolic panel, CBC with Differential/Platelet  Need for hepatitis C screening test - Plan: Hepatitis C Antibody  Screening for HIV without presence of risk factors - Plan: HIV antibody (with reflex) No follow-ups on file.

## 2019-09-13 ENCOUNTER — Ambulatory Visit (INDEPENDENT_AMBULATORY_CARE_PROVIDER_SITE_OTHER): Payer: No Typology Code available for payment source | Admitting: Family Medicine

## 2019-09-13 ENCOUNTER — Encounter: Payer: Self-pay | Admitting: Family Medicine

## 2019-09-13 VITALS — BP 122/78 | HR 71 | Temp 98.2°F | Ht 68.0 in | Wt 201.4 lb

## 2019-09-13 DIAGNOSIS — Z170 Estrogen receptor positive status [ER+]: Secondary | ICD-10-CM

## 2019-09-13 DIAGNOSIS — Z23 Encounter for immunization: Secondary | ICD-10-CM | POA: Diagnosis not present

## 2019-09-13 DIAGNOSIS — E1165 Type 2 diabetes mellitus with hyperglycemia: Secondary | ICD-10-CM

## 2019-09-13 DIAGNOSIS — Z0000 Encounter for general adult medical examination without abnormal findings: Secondary | ICD-10-CM | POA: Diagnosis not present

## 2019-09-13 DIAGNOSIS — Z1159 Encounter for screening for other viral diseases: Secondary | ICD-10-CM

## 2019-09-13 DIAGNOSIS — E785 Hyperlipidemia, unspecified: Secondary | ICD-10-CM

## 2019-09-13 DIAGNOSIS — C50212 Malignant neoplasm of upper-inner quadrant of left female breast: Secondary | ICD-10-CM

## 2019-09-13 DIAGNOSIS — Z1211 Encounter for screening for malignant neoplasm of colon: Secondary | ICD-10-CM | POA: Insufficient documentation

## 2019-09-13 DIAGNOSIS — Z114 Encounter for screening for human immunodeficiency virus [HIV]: Secondary | ICD-10-CM

## 2019-09-13 MED ORDER — FENOFIBRATE 48 MG PO TABS: 48.0000 mg | ORAL_TABLET | Freq: Every day | ORAL | 3 refills | Status: DC

## 2019-09-13 MED FILL — FENOFIBRATE 48 MG TABLET: 48 | 90 days supply | Qty: 90 | Fill #0

## 2019-09-13 MED FILL — SERTRALINE HCL 50 MG TABLET: 50 | 90 days supply | Qty: 90 | Fill #1

## 2019-09-13 NOTE — Assessment & Plan Note (Addendum)
Repeat labs today.  Decrease dose of tricor.  Orders Placed This Encounter  Procedures  . Hepatitis C Antibody  . Microalbumin / creatinine urine ratio  . Hemoglobin A1c  . Lipid panel  . Comprehensive metabolic panel  . CBC with Differential/Platelet  . HIV antibody (with reflex)

## 2019-09-13 NOTE — Assessment & Plan Note (Signed)
Reviewed preventive care protocols, scheduled due services, and updated immunizations Discussed nutrition, exercise, diet, and healthy lifestyle.  

## 2019-09-13 NOTE — Assessment & Plan Note (Addendum)
Followed by Dr. Cruzita Lederer. No changes made. Check labs today.  Orders Placed This Encounter  Procedures  . Hepatitis C Antibody  . Microalbumin / creatinine urine ratio  . Hemoglobin A1c  . Lipid panel  . Comprehensive metabolic panel  . CBC with Differential/Platelet  . HIV antibody (with reflex)

## 2019-09-13 NOTE — Patient Instructions (Addendum)
Great to see you. I will call you with your lab results from today and you can view them online.   We are decreasing your tricor to 48 mg daily.

## 2019-09-13 NOTE — Assessment & Plan Note (Signed)
Followed by Dr. Lindi Adie.  Mammogram UTD- 01/15/19.  He recommends a total of 10 years of tamoxifen.  He recommended folllow up in 08/18/20. On zoloft for hot flashes.

## 2019-09-14 LAB — COMPREHENSIVE METABOLIC PANEL
ALT: 23 U/L (ref 0–35)
AST: 27 U/L (ref 0–37)
Albumin: 4.4 g/dL (ref 3.5–5.2)
Alkaline Phosphatase: 29 U/L — ABNORMAL LOW (ref 39–117)
BUN: 22 mg/dL (ref 6–23)
CO2: 27 mEq/L (ref 19–32)
Calcium: 10 mg/dL (ref 8.4–10.5)
Chloride: 102 mEq/L (ref 96–112)
Creatinine, Ser: 0.66 mg/dL (ref 0.40–1.20)
GFR: 91.67 mL/min (ref >60.00)
Glucose, Bld: 100 mg/dL — ABNORMAL HIGH (ref 70–99)
Potassium: 3.8 mEq/L (ref 3.5–5.1)
Sodium: 140 mEq/L (ref 135–145)
Total Bilirubin: 0.5 mg/dL (ref 0.2–1.2)
Total Protein: 6.8 g/dL (ref 6.0–8.3)

## 2019-09-14 LAB — CBC WITH DIFFERENTIAL/PLATELET
Basophils Absolute: 0.1 10*3/uL (ref 0.0–0.1)
Basophils Relative: 1.2 % (ref 0.0–3.0)
Eosinophils Absolute: 0.2 10*3/uL (ref 0.0–0.7)
Eosinophils Relative: 2.2 % (ref 0.0–5.0)
HCT: 37 % (ref 36.0–46.0)
Hemoglobin: 12 g/dL (ref 12.0–15.0)
Lymphocytes Relative: 46.2 % — ABNORMAL HIGH (ref 12.0–46.0)
Lymphs Abs: 3.5 10*3/uL (ref 0.7–4.0)
MCHC: 32.3 g/dL (ref 30.0–36.0)
MCV: 82.7 fl (ref 78.0–100.0)
Monocytes Absolute: 0.4 10*3/uL (ref 0.1–1.0)
Monocytes Relative: 5.6 % (ref 3.0–12.0)
Neutro Abs: 3.4 10*3/uL (ref 1.4–7.7)
Neutrophils Relative %: 44.8 % (ref 43.0–77.0)
Platelets: 218 10*3/uL (ref 150.0–400.0)
RBC: 4.48 Mil/uL (ref 3.87–5.11)
RDW: 14.3 % (ref 11.5–15.5)
WBC: 7.7 10*3/uL (ref 4.0–10.5)

## 2019-09-14 LAB — HEPATITIS C ANTIBODY
Hepatitis C Ab: NONREACTIVE
SIGNAL TO CUT-OFF: 0.01 (ref <1.00)

## 2019-09-14 LAB — MICROALBUMIN / CREATININE URINE RATIO
Creatinine,U: 116.3 mg/dL
Microalb Creat Ratio: 0.6 mg/g (ref 0.0–30.0)
Microalb, Ur: 0.7 mg/dL (ref 0.0–1.9)

## 2019-09-14 LAB — HEMOGLOBIN A1C: Hgb A1c MFr Bld: 7.9 % — ABNORMAL HIGH (ref 4.6–6.5)

## 2019-09-14 LAB — LIPID PANEL
Cholesterol: 190 mg/dL (ref 0–200)
HDL: 46.3 mg/dL (ref >39.00)
NonHDL: 143.34
Total CHOL/HDL Ratio: 4
Triglycerides: 211 mg/dL — ABNORMAL HIGH (ref 0.0–149.0)
VLDL: 42.2 mg/dL — ABNORMAL HIGH (ref 0.0–40.0)

## 2019-09-14 LAB — HIV ANTIBODY (ROUTINE TESTING W REFLEX): HIV 1&2 Ab, 4th Generation: NONREACTIVE

## 2019-09-14 LAB — TSH: TSH: 1.86 u[IU]/mL (ref 0.35–4.50)

## 2019-09-14 LAB — LDL CHOLESTEROL, DIRECT: Direct LDL: 116 mg/dL

## 2019-09-28 ENCOUNTER — Other Ambulatory Visit: Payer: Self-pay | Admitting: Family Medicine

## 2019-10-13 ENCOUNTER — Encounter: Payer: Self-pay | Admitting: Family Medicine

## 2019-11-04 ENCOUNTER — Other Ambulatory Visit: Payer: Self-pay | Admitting: Internal Medicine

## 2019-11-04 DIAGNOSIS — E1165 Type 2 diabetes mellitus with hyperglycemia: Secondary | ICD-10-CM

## 2019-11-04 MED FILL — glipiZIDE 5 MG TABS: 5 | 90 days supply | Qty: 180 | Fill #0

## 2019-11-04 MED FILL — JANUVIA 100 MG TABLET: 100 | 90 days supply | Qty: 90 | Fill #0

## 2019-11-30 MED FILL — METFORMIN HCL ER 500 MG TB2: 500 | 90 days supply | Qty: 270 | Fill #1

## 2019-12-27 MED FILL — SERTRALINE HCL 50 MG TABS: 50 | 90 days supply | Qty: 90 | Fill #2

## 2020-01-04 MED FILL — OXYBUTYNIN CL ER 10 MG TAB: 10 | 30 days supply | Qty: 30 | Fill #0

## 2020-01-11 ENCOUNTER — Encounter: Payer: Self-pay | Admitting: Family Medicine

## 2020-01-11 ENCOUNTER — Other Ambulatory Visit: Payer: Self-pay

## 2020-01-11 DIAGNOSIS — E1165 Type 2 diabetes mellitus with hyperglycemia: Secondary | ICD-10-CM

## 2020-01-11 MED ORDER — FREESTYLE SYSTEM KIT: 1.0000 | PACK | 1 refills | Status: DC | PRN

## 2020-01-11 MED ORDER — FREESTYLE TEST VI STRP: ORAL_STRIP | 12 refills | Status: DC

## 2020-01-11 MED ORDER — FREESTYLE LANCETS MISC: 12 refills | Status: AC

## 2020-01-11 MED FILL — FREESTYLE LANCETS: 50 days supply | Qty: 100 | Fill #0

## 2020-01-11 MED FILL — FREESTYLE LITE TEST STRIP: 50 days supply | Qty: 100 | Fill #0

## 2020-01-11 MED FILL — FREESTYLE LITE METER: 30 days supply | Qty: 1 | Fill #0

## 2020-01-24 MED FILL — TAMOXIFEN 20 MG TABLET: 20 | 90 days supply | Qty: 90 | Fill #1

## 2020-01-25 MED FILL — JANUVIA 100 MG TABLET: 100 | 60 days supply | Qty: 60 | Fill #1

## 2020-01-26 ENCOUNTER — Telehealth: Payer: Self-pay | Admitting: Family Medicine

## 2020-01-26 NOTE — Telephone Encounter (Signed)
Patient is calling to [facility] from Dr. Deborra Medina to Dr. Ethelene Hal due to Dr. Deborra Medina leaving the practice. Pls advise. CB [phone]

## 2020-01-27 NOTE — Telephone Encounter (Signed)
WK-Plz advise if you are ok with this pt TOC to you/thx dmf

## 2020-01-27 NOTE — Telephone Encounter (Signed)
Yes,that would be okay.

## 2020-02-02 NOTE — Telephone Encounter (Signed)
LM informing pt that it is okay for TOC from Dr. Deborra Medina to Dr. Ethelene Hal asked pt to give [place] a call to schedule an appointment.

## 2020-02-22 MED FILL — METFORMIN HCL ER 500 MG TB2: 500 | 90 days supply | Qty: 270 | Fill #2

## 2020-02-22 MED FILL — MELOXICAM 7.5 MG TABLET: 7.5 | 30 days supply | Qty: 30 | Fill #1

## 2020-02-22 MED FILL — FENOFIBRATE 48 MG TABLET: 48 | 90 days supply | Qty: 90 | Fill #1

## 2020-02-22 MED FILL — glipiZIDE 5 MG TABS: 5 | 90 days supply | Qty: 180 | Fill #1

## 2020-02-22 MED FILL — OXYBUTYNIN CL ER 10 MG TAB: 10 | 90 days supply | Qty: 90 | Fill #0

## 2020-03-07 ENCOUNTER — Telehealth: Payer: Self-pay | Admitting: Family Medicine

## 2020-03-07 NOTE — Telephone Encounter (Signed)
Tried to call pt about message sent in:  Appointment Request From: Veronica Rogers    With Provider: Libby Maw, MD [[facility]]    Preferred Date Range: Any    Preferred Times: Any Time    Reason for visit: Annual Physical    Comments:  Changing from Dr. Deborra Medina. Physical due in October,2021.   LVM

## 2020-03-13 ENCOUNTER — Encounter: Payer: Self-pay | Admitting: Internal Medicine

## 2020-03-17 MED FILL — SERTRALINE HCL 50 MG TABLET: 50 | 90 days supply | Qty: 90 | Fill #3

## 2020-03-17 MED FILL — JANUVIA 100 MG TABLET: 100 | 30 days supply | Qty: 30 | Fill #2

## 2020-04-05 MED FILL — TAMOXIFEN 20 MG TABLET: 20 | 90 days supply | Qty: 90 | Fill #2

## 2020-04-10 MED FILL — JANUVIA 100 MG TABLET: 100 | 30 days supply | Qty: 30 | Fill #3

## 2020-04-20 MED FILL — JANUVIA 100 MG TABLET: 100 | 30 days supply | Qty: 30 | Fill #3

## 2020-04-25 ENCOUNTER — Other Ambulatory Visit: Payer: Self-pay | Admitting: Hematology and Oncology

## 2020-04-25 DIAGNOSIS — Z1231 Encounter for screening mammogram for malignant neoplasm of breast: Secondary | ICD-10-CM

## 2020-04-27 ENCOUNTER — Ambulatory Visit
Admission: RE | Admit: 2020-04-27 | Discharge: 2020-04-27 | Disposition: A | Discharge status: Home / Self Care | Payer: No Typology Code available for payment source | Source: Ambulatory Visit | Attending: Hematology and Oncology | Admitting: Hematology and Oncology

## 2020-04-27 ENCOUNTER — Other Ambulatory Visit: Payer: Self-pay

## 2020-04-27 DIAGNOSIS — Z1231 Encounter for screening mammogram for malignant neoplasm of breast: Secondary | ICD-10-CM

## 2020-05-24 MED FILL — JANUVIA 100 MG TABLET: 100 | 30 days supply | Qty: 30 | Fill #4

## 2020-06-08 ENCOUNTER — Other Ambulatory Visit: Payer: Self-pay | Admitting: Family Medicine

## 2020-06-19 MED FILL — JANUVIA 100 MG TABLET: 100 | 30 days supply | Qty: 30 | Fill #5

## 2020-07-17 ENCOUNTER — Other Ambulatory Visit: Payer: Self-pay | Admitting: Internal Medicine

## 2020-07-18 MED FILL — TAMOXIFEN 20 MG TABLET: 20 | 90 days supply | Qty: 90 | Fill #3

## 2020-07-19 ENCOUNTER — Other Ambulatory Visit: Payer: Self-pay | Admitting: *Deleted

## 2020-07-19 ENCOUNTER — Telehealth: Payer: Self-pay | Admitting: Internal Medicine

## 2020-07-19 ENCOUNTER — Other Ambulatory Visit: Payer: Self-pay | Admitting: Internal Medicine

## 2020-07-19 ENCOUNTER — Encounter: Payer: Self-pay | Admitting: Hematology and Oncology

## 2020-07-19 DIAGNOSIS — C50212 Malignant neoplasm of upper-inner quadrant of left female breast: Secondary | ICD-10-CM

## 2020-07-19 DIAGNOSIS — E1165 Type 2 diabetes mellitus with hyperglycemia: Secondary | ICD-10-CM

## 2020-07-19 MED ORDER — TAMOXIFEN CITRATE 20 MG PO TABS: 20.0000 mg | ORAL_TABLET | Freq: Every day | ORAL | 3 refills | Status: DC

## 2020-07-19 MED ORDER — GLIPIZIDE 5 MG PO TABS: ORAL_TABLET | ORAL | 0 refills | Status: DC

## 2020-07-19 MED ORDER — SITAGLIPTIN PHOSPHATE 100 MG PO TABS: ORAL_TABLET | ORAL | 0 refills | Status: DC

## 2020-07-19 MED ORDER — METFORMIN HCL ER 500 MG PO TB24: ORAL_TABLET | ORAL | 0 refills | Status: DC

## 2020-07-19 MED FILL — JANUVIA 100 MG TABLET: 100 | 30 days supply | Qty: 30 | Fill #0

## 2020-07-19 MED FILL — METFORMIN HCL ER 500 MG TB2: 500 | 90 days supply | Qty: 270 | Fill #0

## 2020-07-19 NOTE — Telephone Encounter (Signed)
Previous refills requests have been denied due to patient not having appointment in over a year.  Patient now has appt for October.   90 day supply sent with no further refills until appointment.

## 2020-07-19 NOTE — Telephone Encounter (Signed)
Medication Refill Request  Did you call your pharmacy and request this refill first? Yes . If patient has not contacted pharmacy first, instruct them to do so for future refills.  . Remind them that contacting the pharmacy for their refill is the quickest method to get the refill.  . Refill policy also stated that it will take anywhere between 24-72 hours to receive the refill.    Name of medication?  metFORMIN (GLUCOPHAGE-XR) 750 MG 24 hr tablet OR metFORMIN (GLUCOPHAGE-XR) 500 MG 24 hr tablet AND  glipiZIDE (GLUCOTROL) 5 MG tablet AND JANUVIA 100 MG tablet  Is this a 90 day supply? Yes  Name and location of pharmacy?  [place], [place] - [address]. Phone:  [phone]  Fax:  [phone]

## 2020-08-15 ENCOUNTER — Inpatient Hospital Stay: Payer: No Typology Code available for payment source | Admitting: Hematology and Oncology

## 2020-08-15 NOTE — Assessment & Plan Note (Deleted)
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 Occasional hot flashes  We discussed optimal duration of tamoxifen therapy. I recommended continuation of antiestrogen therapy for a total of 10 years. Patient was concerned about long-term side effects of tamoxifen. We may elect to stop after 7. We also discussed the role of breast cancer index in determining who needs extended adjuvant therapy. At this point she is planning on staying on tamoxifen.  Breast cancer surveillance: 1. Mammogram 04/27/2020 benign, breast density category B 2. Breast examdone by her gynecologist benign  Return to clinic in 1 year for follow-up

## 2020-08-28 ENCOUNTER — Other Ambulatory Visit: Payer: Self-pay | Admitting: Internal Medicine

## 2020-08-28 DIAGNOSIS — E1165 Type 2 diabetes mellitus with hyperglycemia: Secondary | ICD-10-CM

## 2020-08-28 MED FILL — METFORMIN HCL ER 750 MG TAB: 750 | 90 days supply | Qty: 180 | Fill #0

## 2020-08-28 MED FILL — JANUVIA 100 MG TABLET: 100 | 30 days supply | Qty: 30 | Fill #1

## 2020-08-31 ENCOUNTER — Inpatient Hospital Stay: Payer: No Typology Code available for payment source | Admitting: Hematology and Oncology

## 2020-08-31 NOTE — Assessment & Plan Note (Deleted)
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 Occasional hot flashes  We discussed optimal duration of tamoxifen therapy. I recommended continuation of antiestrogen therapy for a total of 10 years. Patient was concerned about long-term side effects of tamoxifen. We may elect to stop after 7. We also discussed the role of breast cancer index in determining who needs extended adjuvant therapy. At this point she is planning on staying on tamoxifen.  Breast cancer surveillance: 1. Mammogram done 04/27/2020 benign, breast density category B 2. Breast examdone by her gynecologist benign  Return to clinic in 1 year for follow-up

## 2020-09-13 NOTE — Progress Notes (Signed)
Patient Care Team: Libby Maw, MD as PCP - General (Family Medicine)  DIAGNOSIS:    ICD-10-CM   1. 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 with above-mentioned history of left breast cancer treated with lumpectomy, followed by adjuvant chemotherapy, and radiation, and who is currently on antiestrogen therapy with tamoxifen. Mammogram on 04/27/20 showed no evidence of malignancy bilaterally. She presents to the clinic today for annual follow-up.    ALLERGIES:  is allergic to erythromycin and tape.  MEDICATIONS:  Current Outpatient Medications  Medication Sig Dispense Refill  . cyclobenzaprine (FLEXERIL) 5 MG tablet Take 1 tablet (5 mg total) by mouth 3 (three) times daily as needed for muscle spasms. 30 tablet 0  . Diclofenac Sodium (PENNSAID) 2 % SOLN Place 1 application onto the skin 2 (two) times daily. 1 Bottle 3  . fenofibrate (TRICOR) 48 MG tablet Take 1 tablet (48 mg total) by mouth daily. 90 tablet 3  . glipiZIDE (GLUCOTROL) 5 MG tablet TAKE 1 TO 2 TABLETS BY MOUTH DAILY WITH EVENING MEAL 180 tablet 0  . glucose blood (FREESTYLE TEST STRIPS) test strip UAD for bid blood glucose testing Dx: E11.65 100 each 12  . glucose  monitoring kit (FREESTYLE) monitoring kit 1 each by Does not apply route as needed for other. UAD for bid blood glucose testing Dx: E11.65 1 each 1  . Ibuprofen-Famotidine 800-26.6 MG TABS Take 1 tablet by mouth 3 (three) times daily. 90 tablet 3  . Lancets (FREESTYLE) lancets UAD for bid blood glucose testing Dx: E11.65 100 each 12  . meloxicam (MOBIC) 7.5 MG tablet May take one daily as needed with food. 30 tablet 2  . metFORMIN (GLUCOPHAGE-XR) 500 MG 24 hr tablet Take 3 tablets daily with dinner. 270 tablet 0  . metFORMIN (GLUCOPHAGE-XR) 750 MG 24 hr tablet TAKE 2 TABLETS BY MOUTH DAILY AT BEDTIME 180 tablet 3  . Multiple Vitamin (MULTIVITAMIN) tablet Take 1 tablet by mouth daily.    . sertraline (ZOLOFT) 50 MG tablet Take 50 mg by mouth every morning.     . sitaGLIPtin (JANUVIA) 100 MG tablet TAKE 1 TABLET (100 MG TOTAL) BY MOUTH DAILY. 90 tablet 0  . tamoxifen (NOLVADEX) 20 MG tablet Take 1 tablet (20 mg total) by mouth daily. 90 tablet 3  . traMADol (ULTRAM) 50 MG tablet Take 1 tablet (50 mg total) by mouth every 6 (six) hours as needed. 10 tablet 0  . VENTOLIN HFA 108 (90 Base) MCG/ACT inhaler INHALE 1-2 PUFFS INTO THE LUNGS EVERY 6 HOURS AS NEEDED FOR WHEEZING OR SHORTNESS OF BREATH. 18 g 1   No current facility-administered medications for this visit.    PHYSICAL EXAMINATION: ECOG PERFORMANCE STATUS: 1 - Symptomatic but completely ambulatory  There  were no vitals filed for this visit. There were no vitals filed for this visit.  BREAST: No palpable masses or nodules in either right or left breasts. No palpable axillary supraclavicular or infraclavicular adenopathy no breast tenderness or nipple discharge. (exam performed in the presence of a chaperone)  LABORATORY DATA:  I have reviewed the data as listed CMP Latest Ref Rng & Units 09/13/2019 01/19/2018 08/26/2017  Glucose 70 - 99 mg/dL 100(H) 104(H) 124(H)  BUN 6 - 23 mg/dL _0 Creatinine 0.40 - 1.20 mg/dL 0.66 0.73 0.90    Sodium 135 - 145 mEq/L 140 140 140  Potassium 3.5 - 5.1 mEq/L 3.8 4.6 4.0  Chloride 96 - 112 mEq/L 102 102 102  CO2 19 - 32 mEq/L _1 Calcium 8.4 - 10.5 mg/dL 10.0 10.3 9.9  Total Protein 6.0 - 8.3 g/dL 6.8 7.0 6.7  Total Bilirubin 0.2 - 1.2 mg/dL 0.5 0.5 0.4  Alkaline Phos 39 - 117 U/L 29(L) 33(L) 37  AST 0 - 37 U/L _2 ALT 0 - 35 U/L _3 Lab Results  Component Value Date   WBC 7.7 09/13/2019   HGB 12.0 09/13/2019   HCT 37.0 09/13/2019   MCV 82.7 09/13/2019   PLT 218.0 09/13/2019   NEUTROABS 3.4 09/13/2019    ASSESSMENT & PLAN:  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 Occasional hot flashes  Plan of treatment: 10 years of tamoxifen.  If she wants to stop sooner than we will consider doing breast cancer index  Breast cancer surveillance: 1. Mammogram 04/27/2020 benign, breast density category B 2. Breast examdone by her gynecologist benign  Return to clinic in 1 year for follow-up    No orders of the defined types were placed in this encounter.  The patient has a good understanding of the overall plan. she agrees with it. she will call with any problems that may develop before the next visit here.  Total time spent: 20 mins including face to face time and time spent for planning, charting and coordination of care  Nicholas Lose, MD 09/14/2020  I, Cloyde Reams Dorshimer, am acting as scribe for Dr. Nicholas Lose.  I have reviewed the above documentation for accuracy and completeness, and I agree with the above.

## 2020-09-14 ENCOUNTER — Other Ambulatory Visit: Payer: Self-pay

## 2020-09-14 ENCOUNTER — Inpatient Hospital Stay
Payer: No Typology Code available for payment source | Attending: Hematology and Oncology | Admitting: Hematology and Oncology

## 2020-09-14 ENCOUNTER — Telehealth: Payer: Self-pay | Admitting: Hematology and Oncology

## 2020-09-14 DIAGNOSIS — Z9221 Personal history of antineoplastic chemotherapy: Secondary | ICD-10-CM | POA: Insufficient documentation

## 2020-09-14 DIAGNOSIS — Z923 Personal history of irradiation: Secondary | ICD-10-CM | POA: Diagnosis not present

## 2020-09-14 DIAGNOSIS — Z79810 Long term (current) use of selective estrogen receptor modulators (SERMs): Secondary | ICD-10-CM | POA: Insufficient documentation

## 2020-09-14 DIAGNOSIS — Z170 Estrogen receptor positive status [ER+]: Secondary | ICD-10-CM | POA: Insufficient documentation

## 2020-09-14 DIAGNOSIS — Z79899 Other long term (current) drug therapy: Secondary | ICD-10-CM | POA: Diagnosis not present

## 2020-09-14 DIAGNOSIS — Z7984 Long term (current) use of oral hypoglycemic drugs: Secondary | ICD-10-CM | POA: Insufficient documentation

## 2020-09-14 DIAGNOSIS — C50212 Malignant neoplasm of upper-inner quadrant of left female breast: Secondary | ICD-10-CM | POA: Diagnosis not present

## 2020-09-14 DIAGNOSIS — Z791 Long term (current) use of non-steroidal anti-inflammatories (NSAID): Secondary | ICD-10-CM | POA: Insufficient documentation

## 2020-09-14 NOTE — Telephone Encounter (Signed)
Scheduled appts per 9/16 los. Gave pt a print out of AVS.

## 2020-09-14 NOTE — Assessment & Plan Note (Signed)
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 Occasional hot flashes  Plan of treatment: 10 years of tamoxifen.  If she wants to stop sooner than we will consider doing breast cancer index  Breast cancer surveillance: 1. Mammogram 04/27/2020 benign, breast density category B 2. Breast examdone by her gynecologist benign  Return to clinic in 1 year for follow-up

## 2020-09-20 MED FILL — SERTRALINE HCL 50 MG TABLET: 50 | 90 days supply | Qty: 90 | Fill #1

## 2020-09-21 ENCOUNTER — Other Ambulatory Visit: Payer: Self-pay | Admitting: Hematology and Oncology

## 2020-09-21 DIAGNOSIS — C50212 Malignant neoplasm of upper-inner quadrant of left female breast: Secondary | ICD-10-CM

## 2020-09-21 MED FILL — JANUVIA 100 MG TABLET: 100 | 30 days supply | Qty: 30 | Fill #2

## 2020-10-03 LAB — HM DIABETES EYE EXAM

## 2020-10-13 ENCOUNTER — Other Ambulatory Visit: Payer: Self-pay

## 2020-10-13 ENCOUNTER — Ambulatory Visit (INDEPENDENT_AMBULATORY_CARE_PROVIDER_SITE_OTHER): Payer: No Typology Code available for payment source | Admitting: Family Medicine

## 2020-10-13 ENCOUNTER — Encounter: Payer: Self-pay | Admitting: Family Medicine

## 2020-10-13 VITALS — BP 122/70 | HR 71 | Temp 96.8°F | Ht 68.0 in | Wt 196.4 lb

## 2020-10-13 DIAGNOSIS — E1165 Type 2 diabetes mellitus with hyperglycemia: Secondary | ICD-10-CM

## 2020-10-13 DIAGNOSIS — Z0000 Encounter for general adult medical examination without abnormal findings: Secondary | ICD-10-CM

## 2020-10-13 DIAGNOSIS — G5602 Carpal tunnel syndrome, left upper limb: Secondary | ICD-10-CM | POA: Diagnosis not present

## 2020-10-13 DIAGNOSIS — M25561 Pain in right knee: Secondary | ICD-10-CM

## 2020-10-13 DIAGNOSIS — G8929 Other chronic pain: Secondary | ICD-10-CM

## 2020-10-13 LAB — URINALYSIS, ROUTINE W REFLEX MICROSCOPIC
Bilirubin Urine: NEGATIVE
Hgb urine dipstick: NEGATIVE
Ketones, ur: NEGATIVE
Leukocytes,Ua: NEGATIVE
Nitrite: NEGATIVE
Specific Gravity, Urine: 1.02 (ref 1.000–1.030)
Total Protein, Urine: NEGATIVE
Urine Glucose: NEGATIVE
Urobilinogen, UA: 0.2 (ref 0.0–1.0)
pH: 6.5 (ref 5.0–8.0)

## 2020-10-13 LAB — COMPREHENSIVE METABOLIC PANEL
ALT: 11 U/L (ref 0–35)
AST: 20 U/L (ref 0–37)
Albumin: 4.3 g/dL (ref 3.5–5.2)
Alkaline Phosphatase: 67 U/L (ref 39–117)
BUN: 11 mg/dL (ref 6–23)
CO2: 29 mEq/L (ref 19–32)
Calcium: 9.4 mg/dL (ref 8.4–10.5)
Chloride: 103 mEq/L (ref 96–112)
Creatinine, Ser: 0.83 mg/dL (ref 0.40–1.20)
GFR: 76.61 mL/min (ref >60.00)
Glucose, Bld: 90 mg/dL (ref 70–99)
Potassium: 4.4 mEq/L (ref 3.5–5.1)
Sodium: 139 mEq/L (ref 135–145)
Total Bilirubin: 0.6 mg/dL (ref 0.2–1.2)
Total Protein: 6.9 g/dL (ref 6.0–8.3)

## 2020-10-13 LAB — CBC
HCT: 33.9 % — ABNORMAL LOW (ref 36.0–46.0)
Hemoglobin: 11.4 g/dL — ABNORMAL LOW (ref 12.0–15.0)
MCHC: 33.7 g/dL (ref 30.0–36.0)
MCV: 92.1 fl (ref 78.0–100.0)
Platelets: 248 10*3/uL (ref 150.0–400.0)
RBC: 3.68 Mil/uL — ABNORMAL LOW (ref 3.87–5.11)
RDW: 13.8 % (ref 11.5–15.5)
WBC: 5.9 10*3/uL (ref 4.0–10.5)

## 2020-10-13 LAB — LIPID PANEL
Cholesterol: 181 mg/dL (ref 0–200)
HDL: 73.8 mg/dL (ref >39.00)
LDL Cholesterol: 93 mg/dL (ref 0–99)
NonHDL: 106.92
Total CHOL/HDL Ratio: 2
Triglycerides: 72 mg/dL (ref 0.0–149.0)
VLDL: 14.4 mg/dL (ref 0.0–40.0)

## 2020-10-13 LAB — MICROALBUMIN / CREATININE URINE RATIO
Creatinine,U: 83 mg/dL
Microalb Creat Ratio: 0.8 mg/g (ref 0.0–30.0)
Microalb, Ur: <0.7 mg/dL (ref 0.0–1.9)

## 2020-10-13 LAB — HEMOGLOBIN A1C: Hgb A1c MFr Bld: 7.2 % — ABNORMAL HIGH (ref 4.6–6.5)

## 2020-10-13 NOTE — Patient Instructions (Addendum)
Carpal Tunnel Syndrome  Carpal tunnel syndrome is a condition that causes pain in your hand and arm. The carpal tunnel is a narrow area located on the palm side of your wrist. Repeated wrist motion or certain diseases may cause swelling within the tunnel. This swelling pinches the main nerve in the wrist (median nerve). What are the causes? This condition may be caused by:  Repeated wrist motions.  Wrist injuries.  Arthritis.  A cyst or tumor in the carpal tunnel.  Fluid buildup during pregnancy. Sometimes the cause of this condition is not known. What increases the risk? The following factors may make you more likely to develop this condition:  Having a job, such as being a Haematologist, that requires you to repeatedly move your wrist in the same motion.  Being a woman.  Having certain conditions, such as: ? Diabetes. ? Obesity. ? An underactive thyroid (hypothyroidism). ? Kidney failure. What are the signs or symptoms? Symptoms of this condition include:  A tingling feeling in your fingers, especially in your thumb, index, and middle fingers.  Tingling or numbness in your hand.  An aching feeling in your entire arm, especially when your wrist and elbow are bent for a long time.  Wrist pain that goes up your arm to your shoulder.  Pain that goes down into your palm or fingers.  A weak feeling in your hands. You may have trouble grabbing and holding items. Your symptoms may feel worse during the night. How is this diagnosed? This condition is diagnosed with a medical history and physical exam. You may also have tests, including:  Electromyogram (EMG). This test measures electrical signals sent by your nerves into the muscles.  Nerve conduction study. This test measures how well electrical signals pass through your nerves.  Imaging tests, such as X-rays, ultrasound, and MRI. These tests check for possible causes of your condition. How is this treated? This  condition may be treated with:  Lifestyle changes. It is important to stop or change the activity that caused your condition.  Doing exercise and activities to strengthen your muscles and bones (physical therapy).  Learning how to use your hand again after diagnosis (occupational therapy).  Medicines for pain and inflammation. This may include medicine that is injected into your wrist.  A wrist splint.  Surgery. Follow these instructions at home: If you have a splint:  Wear the splint as told by your health care provider. Remove it only as told by your health care provider.  Loosen the splint if your fingers tingle, become numb, or turn cold and blue.  Keep the splint clean.  If the splint is not waterproof: ? Do not let it get wet. ? Cover it with a watertight covering when you take a bath or shower. Managing pain, stiffness, and swelling   If directed, put ice on the painful area: ? If you have a removable splint, remove it as told by your health care provider. ? Put ice in a plastic bag. ? Place a towel between your skin and the bag. ? Leave the ice on for 20 minutes, 2-3 times per day. General instructions  Take over-the-counter and prescription medicines only as told by your health care provider.  Rest your wrist from any activity that may be causing your pain. If your condition is work related, talk with your employer about changes that can be made, such as getting a wrist pad to use while typing.  Do any exercises as told  by your health care provider, physical therapist, or occupational therapist.  Keep all follow-up visits as told by your health care provider. This is important. Contact a health care provider if:  You have new symptoms.  Your pain is not controlled with medicines.  Your symptoms get worse. Get help right away if:  You have severe numbness or tingling in your wrist or hand. Summary  Carpal tunnel syndrome is a condition that causes pain in  your hand and arm.  It is usually caused by repeated wrist motions.  Lifestyle changes and medicines are used to treat carpal tunnel syndrome. Surgery may be recommended.  Follow your health care provider's instructions about wearing a splint, resting from activity, keeping follow-up visits, and calling for help. This information is not intended to replace advice given to you by your health care provider. Make sure you discuss any questions you have with your health care provider. Document Revised: 04/24/2018 Document Reviewed: 04/24/2018 Elsevier Patient Education  [place].  Chronic Knee Pain, Adult Chronic knee pain is pain in one or both knees that lasts longer than 3 months. Symptoms of chronic knee pain may include swelling, stiffness, and discomfort. Age-related wear and tear (osteoarthritis) of the knee joint is the most common cause of chronic knee pain. Other possible causes include:  A long-term immune-related disease that causes inflammation of the knee (rheumatoid arthritis). This usually affects both knees.  Inflammatory arthritis, such as gout or pseudogout.  An injury to the knee that causes arthritis.  An injury to the knee that damages the ligaments. Ligaments are strong tissues that connect bones to each other.  Runner's knee or pain behind the kneecap. Treatment for chronic knee pain depends on the cause. The main treatments for chronic knee pain are physical therapy and weight loss. This condition may also be treated with medicines, injections, a knee sleeve or brace, and by using crutches. Rest, ice, compression (pressure), and elevation (RICE) therapy may also be recommended. Follow these instructions at home: If you have a knee sleeve or brace:   Wear it as told by your health care provider. Remove it only as told by your health care provider.  Loosen it if your toes tingle, become numb, or turn cold and blue.  Keep it clean.  If the sleeve or brace  is not waterproof: ? Do not let it get wet. ? Remove it if allowed by your health care provider, or cover it with a watertight covering when you take a bath or a shower. Managing pain, stiffness, and swelling      If directed, apply heat to the affected area as often as told by your health care provider. Use the heat source that your health care provider recommends, such as a moist heat pack or a heating pad. ? If you have a removable sleeve or brace, remove it as told by your health care provider. ? Place a towel between your skin and the heat source. ? Leave the heat on for 20-30 minutes. ? Remove the heat if your skin turns bright red. This is especially important if you are unable to feel pain, heat, or cold. You may have a greater risk of getting burned.  If directed, put ice on the affected area. ? If you have a removable sleeve or brace, remove it as told by your health care provider. ? Put ice in a plastic bag. ? Place a towel between your skin and the bag. ? Leave the ice on  for 20 minutes, 2-3 times a day.  Move your toes often to reduce stiffness and swelling.  Raise (elevate) the injured area above the level of your heart while you are sitting or lying down. Activity  Avoid activities where both feet leave the ground at the same time (high-impact activities). Examples are running, jumping rope, and doing jumping jacks.  Return to your normal activities as told by your health care provider. Ask your health care provider what activities are safe for you.  Follow the exercise plan that your health care provider designed for you. Your health care provider may suggest that you: ? Avoid activities that make knee pain worse. This may require you to change your exercise routines, sport participation, or job duties. ? Wear shoes with cushioned soles. ? Avoid high-impact activities or sports that require running and sudden changes in direction. ? Do physical therapy as told by your  health care provider. Physical therapy is planned to match your needs and abilities. It may include exercises for strength, flexibility, stability, and endurance. ? Do exercises that increase balance and strength, such as tai chi and yoga.  Do not use the injured limb to support your body weight until your health care provider says that you can. Use crutches, a cane, or a walker, as told by your health care provider. General instructions  Take over-the-counter and prescription medicines only as told by your health care provider.  Lose weight if you are overweight. Losing even a little weight can reduce knee pain. Ask your health care provider what your ideal weight is, and how to safely lose extra weight. A food expert (dietitian) may be able to help you plan your meals.  Do not use any products that contain nicotine or tobacco, such as cigarettes, e-cigarettes, and chewing tobacco. These can delay healing. If you need help quitting, ask your health care provider.  Keep all follow-up visits as told by your health care provider. This is important. Contact a health care provider if:  You have knee pain that is not getting better or gets worse.  You are unable to do your physical therapy exercises due to knee pain. Get help right away if:  Your knee swells and the swelling becomes worse.  You cannot move your knee.  You have severe knee pain. Summary  Knee pain that lasts more than 3 months is considered chronic knee pain.  The main treatments for chronic knee pain are physical therapy and weight loss. You may also need to take medicines, wear a knee sleeve or brace, use crutches, and apply ice or heat.  Losing even a little weight can reduce knee pain. Ask your health care provider what your ideal weight is, and how to safely lose extra weight. A food expert (dietitian) may be able to help you plan your meals.  Work with a physical therapist to make a safe exercise program, as told by  your health care provider. This information is not intended to replace advice given to you by your health care provider. Make sure you discuss any questions you have with your health care provider. Document Revised: 02/25/2019 Document Reviewed: 02/25/2019 Elsevier Patient Education  [place] Maintenance, Female Adopting a healthy lifestyle and getting preventive care are important in promoting health and wellness. Ask your health care provider about:  The right schedule for you to have regular tests and exams.  Things you can do on your own to prevent diseases and keep yourself healthy.  What should I know about diet, weight, and exercise? Eat a healthy diet   Eat a diet that includes plenty of vegetables, fruits, low-fat dairy products, and lean protein.  Do not eat a lot of foods that are high in solid fats, added sugars, or sodium. Maintain a healthy weight Body mass index (BMI) is used to identify weight problems. It estimates body fat based on height and weight. Your health care provider can help determine your BMI and help you achieve or maintain a healthy weight. Get regular exercise Get regular exercise. This is one of the most important things you can do for your health. Most adults should:  Exercise for at least 150 minutes each week. The exercise should increase your heart rate and make you sweat (moderate-intensity exercise).  Do strengthening exercises at least twice a week. This is in addition to the moderate-intensity exercise.  Spend less time sitting. Even light physical activity can be beneficial. Watch cholesterol and blood lipids Have your blood tested for lipids and cholesterol at 60 years of age, then have this test every 5 years. Have your cholesterol levels checked more often if:  Your lipid or cholesterol levels are high.  You are older than 60 years of age.  You are at high risk for heart disease. What should I know about cancer  screening? Depending on your health history and family history, you may need to have cancer screening at various ages. This may include screening for:  Breast cancer.  Cervical cancer.  Colorectal cancer.  Skin cancer.  Lung cancer. What should I know about heart disease, diabetes, and high blood pressure? Blood pressure and heart disease  High blood pressure causes heart disease and increases the risk of stroke. This is more likely to develop in people who have high blood pressure readings, are of African descent, or are overweight.  Have your blood pressure checked: ? Every 3-5 years if you are 58-57 years of age. ? Every year if you are 50 years old or older. Diabetes Have regular diabetes screenings. This checks your fasting blood sugar level. Have the screening done:  Once every three years after age 60 if you are at a normal weight and have a low risk for diabetes.  More often and at a younger age if you are overweight or have a high risk for diabetes. What should I know about preventing infection? Hepatitis B If you have a higher risk for hepatitis B, you should be screened for this virus. Talk with your health care provider to find out if you are at risk for hepatitis B infection. Hepatitis C Testing is recommended for:  Everyone born from [birth year] through 1966.  Anyone with known risk factors for hepatitis C. Sexually transmitted infections (STIs)  Get screened for STIs, including gonorrhea and chlamydia, if: ? You are sexually active and are younger than 60 years of age. ? You are older than 61 years of age and your health care provider tells you that you are at risk for this type of infection. ? Your sexual activity has changed since you were last screened, and you are at increased risk for chlamydia or gonorrhea. Ask your health care provider if you are at risk.  Ask your health care provider about whether you are at high risk for HIV. Your health care provider may  recommend a prescription medicine to help prevent HIV infection. If you choose to take medicine to prevent HIV, you should first get tested for HIV. You  should then be tested every 3 months for as long as you are taking the medicine. Pregnancy  If you are about to stop having your period (premenopausal) and you may become pregnant, seek counseling before you get pregnant.  Take 400 to 800 micrograms (mcg) of folic acid every day if you become pregnant.  Ask for birth control (contraception) if you want to prevent pregnancy. Osteoporosis and menopause Osteoporosis is a disease in which the bones lose minerals and strength with aging. This can result in bone fractures. If you are 22 years old or older, or if you are at risk for osteoporosis and fractures, ask your health care provider if you should:  Be screened for bone loss.  Take a calcium or vitamin D supplement to lower your risk of fractures.  Be given hormone replacement therapy (HRT) to treat symptoms of menopause. Follow these instructions at home: Lifestyle  Do not use any products that contain nicotine or tobacco, such as cigarettes, e-cigarettes, and chewing tobacco. If you need help quitting, ask your health care provider.  Do not use street drugs.  Do not share needles.  Ask your health care provider for help if you need support or information about quitting drugs. Alcohol use  Do not drink alcohol if: ? Your health care provider tells you not to drink. ? You are pregnant, may be pregnant, or are planning to become pregnant.  If you drink alcohol: ? Limit how much you use to 0-1 drink a day. ? Limit intake if you are breastfeeding.  Be aware of how much alcohol is in your drink. In the [place] one drink equals one 12 oz bottle of beer (355 mL), one 5 oz glass of wine (148 mL), or one 1 oz glass of hard liquor (44 mL). General instructions  Schedule regular health, dental, and eye exams.  Stay current with your  vaccines.  Tell your health care provider if: ? You often feel depressed. ? You have ever been abused or do not feel safe at home. Summary  Adopting a healthy lifestyle and getting preventive care are important in promoting health and wellness.  Follow your health care provider's instructions about healthy diet, exercising, and getting tested or screened for diseases.  Follow your health care provider's instructions on monitoring your cholesterol and blood pressure. This information is not intended to replace advice given to you by your health care provider. Make sure you discuss any questions you have with your health care provider. Document Revised: 12/09/2018 Document Reviewed: 12/09/2018 Elsevier Patient Education  [place].  Preventing Carpal Tunnel Syndrome  Carpal tunnel syndrome is a condition that causes pain, numbness, and weakness in the wrist, hand, and fingers. The carpal tunnel is a narrow, hollow space in the wrist. Tendons and one of the main nerves in the hand (median nerve) pass through the carpal tunnel. The median nerve supplies feeling to the thumb and the first three fingers. It also supplies the muscles at the base of the thumb. Carpal tunnel syndrome happens when the median nerve gets squeezed in the area where it passes through the carpal tunnel. In some cases, it may not be possible to prevent carpal tunnel syndrome. However, you can take steps to relieve pressure on your wrist and reduce your risk of developing this condition. How can this condition affect me? Carpal tunnel syndrome can affect your ability to do jobs or activities that involve hand, wrist, and finger action. It can cause symptoms such as:  Pain in the wrist, hand, and fingers.  Burning, tingling, or numbness in the affected area.  A weak feeling in your hands. You may have trouble grabbing and holding items. Symptoms may get worse over time. For some people, symptoms get worse at  night. What can increase my risk? The following factors may make you more likely to develop this condition:  Having a job that requires you to repeatedly move your wrist or requires you to use tools that vibrate. This may include jobs that involve using computers, working on an [employer], or working with Marie such as Pension scheme manager.  Being a woman.  Having a family history of the condition.  Having certain conditions, such as: ? Diabetes. ? Pregnancy. ? Obesity. ? Thyroid disease. ? Rheumatoid arthritis. What actions can I take to help prevent this condition?      Avoid making repetitive hand and wrist motions that cause your wrist to get stiff or painful.  Take frequent breaks if you use your hands and wrists for many hours at a time.  Stretch your hands and fingers often to get blood flowing and relieve tension.  Keep your wrists in the natural position when using a computer keyboard or mouse. Do not bend your wrists downward or sideways.  If you use your hands and wrists for many hours at work, make changes to your work space to ease pressure on your wrists. You may want to use: ? A padded wrist rest for computer work. ? A slanted computer keyboard. ? Hand tools with padded handles to reduce vibrations.  Consider wearing a wrist brace. This will not prevent carpal tunnel syndrome but may keep it from getting worse. A wrist brace reduces bending and stress.  Closely manage any medical conditions you have that can put you at risk for carpal tunnel syndrome. Have your blood sugar checked to make sure you are not developing diabetes. If you have diabetes, work with your health care provider to keep your blood sugar under control. Where to find more information  [employer] of Neurological Disorders and Stroke: [email or web address]  [place] [facility]: [email or web address] Contact a health care provider if:  You have numbness or tingling in your  wrist, hand, or fingers.  You have pain or a burning sensation in your wrist, hand, or fingers.  Pain, tingling, or burning wakes you up at night.  Your hand becomes weak and clumsy.  You frequently drop objects.  You are unable to use your wrists and hands without pain. Summary  Carpal tunnel syndrome is a condition that causes pain, numbness, and weakness in the wrist, hand, and fingers.  You can take steps to relieve pressure on your wrist and reduce your risk of developing this condition.  Avoid making repetitive hand and wrist motions that cause your wrist to get stiff or painful.  If you use your hands and wrists for many hours at work, you may want to make changes to your work space to ease pressure on your wrists.  Take frequent breaks to stretch your hands and fingers. This information is not intended to replace advice given to you by your health care provider. Make sure you discuss any questions you have with your health care provider. Document Revised: 04/30/2018 Document Reviewed: 04/30/2018 Elsevier Patient Education  2020 Elsevier Inc.  Preventive Care 80-20 Years Old, Female Preventive care refers to visits with your health care provider and lifestyle choices that can promote health and wellness. This  includes:  A yearly physical exam. This may also be called an annual well check.  Regular dental visits and eye exams.  Immunizations.  Screening for certain conditions.  Healthy lifestyle choices, such as eating a healthy diet, getting regular exercise, not using drugs or products that contain nicotine and tobacco, and limiting alcohol use. What can I expect for my preventive care visit? Physical exam Your health care provider will check your:  Height and weight. This may be used to calculate body mass index (BMI), which tells if you are at a healthy weight.  Heart rate and blood pressure.  Skin for abnormal spots. Counseling Your health care provider may  ask you questions about your:  Alcohol, tobacco, and drug use.  Emotional well-being.  Home and relationship well-being.  Sexual activity.  Eating habits.  Work and work Statistician.  Method of birth control.  Menstrual cycle.  Pregnancy history. What immunizations do I need?  Influenza (flu) vaccine  This is recommended every year. Tetanus, diphtheria, and pertussis (Tdap) vaccine  You may need a Td booster every 10 years. Varicella (chickenpox) vaccine  You may need this if you have not been vaccinated. Zoster (shingles) vaccine  You may need this after age 46. Measles, mumps, and rubella (MMR) vaccine  You may need at least one dose of MMR if you were born in 1957 or later. You may also need a second dose. Pneumococcal conjugate (PCV13) vaccine  You may need this if you have certain conditions and were not previously vaccinated. Pneumococcal polysaccharide (PPSV23) vaccine  You may need one or two doses if you smoke cigarettes or if you have certain conditions. Meningococcal conjugate (MenACWY) vaccine  You may need this if you have certain conditions. Hepatitis A vaccine  You may need this if you have certain conditions or if you travel or work in places where you may be exposed to hepatitis A. Hepatitis B vaccine  You may need this if you have certain conditions or if you travel or work in places where you may be exposed to hepatitis B. Haemophilus influenzae type b (Hib) vaccine  You may need this if you have certain conditions. Human papillomavirus (HPV) vaccine  If recommended by your health care provider, you may need three doses over 6 months. You may receive vaccines as individual doses or as more than one vaccine together in one shot (combination vaccines). Talk with your health care provider about the risks and benefits of combination vaccines. What tests do I need? Blood tests  Lipid and cholesterol levels. These may be checked every 5 years,  or more frequently if you are over 39 years old.  Hepatitis C test.  Hepatitis B test. Screening  Lung cancer screening. You may have this screening every year starting at age 9 if you have a 30-pack-year history of smoking and currently smoke or have quit within the past 15 years.  Colorectal cancer screening. All adults should have this screening starting at age 71 and continuing until age 30. Your health care provider may recommend screening at age 50 if you are at increased risk. You will have tests every 1-10 years, depending on your results and the type of screening test.  Diabetes screening. This is done by checking your blood sugar (glucose) after you have not eaten for a while (fasting). You may have this done every 1-3 years.  Mammogram. This may be done every 1-2 years. Talk with your health care provider about when you should start having  regular mammograms. This may depend on whether you have a family history of breast cancer.  BRCA-related cancer screening. This may be done if you have a family history of breast, ovarian, tubal, or peritoneal cancers.  Pelvic exam and Pap test. This may be done every 3 years starting at age 71. Starting at age 60, this may be done every 5 years if you have a Pap test in combination with an HPV test. Other tests  Sexually transmitted disease (STD) testing.  Bone density scan. This is done to screen for osteoporosis. You may have this scan if you are at high risk for osteoporosis. Follow these instructions at home: Eating and drinking  Eat a diet that includes fresh fruits and vegetables, whole grains, lean protein, and low-fat dairy.  Take vitamin and mineral supplements as recommended by your health care provider.  Do not drink alcohol if: ? Your health care provider tells you not to drink. ? You are pregnant, may be pregnant, or are planning to become pregnant.  If you drink alcohol: ? Limit how much you have to 0-1 drink a  day. ? Be aware of how much alcohol is in your drink. In the [place] one drink equals one 12 oz bottle of beer (355 mL), one 5 oz glass of wine (148 mL), or one 1 oz glass of hard liquor (44 mL). Lifestyle  Take daily care of your teeth and gums.  Stay active. Exercise for at least 30 minutes on 5 or more days each week.  Do not use any products that contain nicotine or tobacco, such as cigarettes, e-cigarettes, and chewing tobacco. If you need help quitting, ask your health care provider.  If you are sexually active, practice safe sex. Use a condom or other form of birth control (contraception) in order to prevent pregnancy and STIs (sexually transmitted infections).  If told by your health care provider, take low-dose aspirin daily starting at age 49. What's next?  Visit your health care provider once a year for a well check visit.  Ask your health care provider how often you should have your eyes and teeth checked.  Stay up to date on all vaccines. This information is not intended to replace advice given to you by your health care provider. Make sure you discuss any questions you have with your health care provider. Document Revised: 08/27/2018 Document Reviewed: 08/27/2018 Elsevier Patient Education  2020 [employer].

## 2020-10-13 NOTE — Progress Notes (Signed)
Established Patient Office Visit  Subjective:  Patient ID: Veronica Rogers, female    DOB: 1961-08-24  Age: 59 y.o. MRN: [id number]  CC:  Chief Complaint  Patient presents with  . Transitions Of Care    toc from Dr. Deborra Medina, general arthritis concerns.     HPI Veronica Rogers presents for establishment of care with me.  She has ongoing right knee pain and she is currently seeing sports medicine for.  She is interested in continuing exercise and losing weight.  She also tells about tingling in the tip of her left finger.  She is right-hand dominant.  She does work on a Teaching laboratory technician.  Occasionally experiences neck pain.  There is no weakness in her arms or radiating pain from her neck.  Currently seeing endocrinology for follow-up of her diabetes significant past medical history of breast cancer.  She is currently also under oncology care.  Past Medical History:  Diagnosis Date  . Anxiety   . Breast cancer ([place])   . Contact lens/glasses fitting    wears contacts or glasses  . Depression   . Diabetes ([place]) 11/22/2014  . Hyperlipidemia   . Personal history of chemotherapy   . Personal history of radiation therapy   . Radiation 03/28/14-05/06/14   Left Breast 60 Gy    Past Surgical History:  Procedure Laterality Date  . BREAST BIOPSY  1996   right breast, benign  . BREAST LUMPECTOMY WITH NEEDLE LOCALIZATION AND AXILLARY SENTINEL LYMPH NODE BX Left 10/29/2013   Procedure: BREAST LUMPECTOMY WITH NEEDLE LOCALIZATION AND AXILLARY SENTINEL LYMPH NODE BX;  Surgeon: Rolm Bookbinder, MD;  Location: [facility];  Service: General;  Laterality: Left;  . COLONOSCOPY  05/2011   WNL  . WISDOM TOOTH EXTRACTION      Family History  Problem Relation Age of Onset  . Squamous cell carcinoma Father   . Cancer Father   . Diabetes Father   . Hypertension Mother   . Diabetes Sister     Social History   Socioeconomic History  . Marital status: Married    Spouse name: Not on file    . Number of children: Not on file  . Years of education: Not on file  . Highest education level: Not on file  Occupational History  . Not on file  Tobacco Use  . Smoking status: Never Smoker  . Smokeless tobacco: Never Used  Substance and Sexual Activity  . Alcohol use: Yes    Comment: 1-2 drinks a month  . Drug use: No  . Sexual activity: Yes  Other Topics Concern  . Not on file  Social History Narrative  . Not on file   Social Determinants of Health   Financial Resource Strain:   . Difficulty of Paying Living Expenses: Not on file  Food Insecurity:   . Worried About Charity fundraiser in the Last Year: Not on file  . Ran Out of Food in the Last Year: Not on file  Transportation Needs:   . Lack of Transportation (Medical): Not on file  . Lack of Transportation (Non-Medical): Not on file  Physical Activity:   . Days of Exercise per Week: Not on file  . Minutes of Exercise per Session: Not on file  Stress:   . Feeling of Stress : Not on file  Social Connections:   . Frequency of Communication with Friends and Family: Not on file  . Frequency of Social Gatherings with Friends and Family: Not  on file  . Attends Religious Services: Not on file  . Active Member of Clubs or Organizations: Not on file  . Attends Banker Meetings: Not on file  . Marital Status: Not on file  Intimate Partner Violence:   . Fear of Current or Ex-Partner: Not on file  . Emotionally Abused: Not on file  . Physically Abused: Not on file  . Sexually Abused: Not on file    Outpatient Medications Prior to Visit  Medication Sig Dispense Refill  . fenofibrate (TRICOR) 48 MG tablet Take 1 tablet (48 mg total) by mouth daily. 90 tablet 3  . glipiZIDE (GLUCOTROL) 5 MG tablet TAKE 1 TO 2 TABLETS BY MOUTH DAILY WITH EVENING MEAL 180 tablet 0  . glucose blood (FREESTYLE TEST STRIPS) test strip UAD for bid blood glucose testing Dx: E11.65 100 each 12  . glucose monitoring kit (FREESTYLE)  monitoring kit 1 each by Does not apply route as needed for other. UAD for bid blood glucose testing Dx: E11.65 1 each 1  . Ibuprofen-Famotidine 800-26.6 MG TABS Take 1 tablet by mouth 3 (three) times daily. 90 tablet 3  . Lancets (FREESTYLE) lancets UAD for bid blood glucose testing Dx: E11.65 100 each 12  . meloxicam (MOBIC) 7.5 MG tablet May take one daily as needed with food. 30 tablet 2  . metFORMIN (GLUCOPHAGE-XR) 500 MG 24 hr tablet Take 3 tablets daily with dinner. 270 tablet 0  . metFORMIN (GLUCOPHAGE-XR) 750 MG 24 hr tablet TAKE 2 TABLETS BY MOUTH DAILY AT BEDTIME 180 tablet 3  . sertraline (ZOLOFT) 50 MG tablet Take 50 mg by mouth every morning.     . sitaGLIPtin (JANUVIA) 100 MG tablet TAKE 1 TABLET (100 MG TOTAL) BY MOUTH DAILY. 90 tablet 0  . tamoxifen (NOLVADEX) 20 MG tablet TAKE 1 TABLET BY MOUTH DAILY. 90 tablet 3  . VENTOLIN HFA 108 (90 Base) MCG/ACT inhaler INHALE 1-2 PUFFS INTO THE LUNGS EVERY 6 HOURS AS NEEDED FOR WHEEZING OR SHORTNESS OF BREATH. 18 g 1  . cyclobenzaprine (FLEXERIL) 5 MG tablet Take 1 tablet (5 mg total) by mouth 3 (three) times daily as needed for muscle spasms. (Patient not taking: Reported on 10/13/2020) 30 tablet 0  . Diclofenac Sodium (PENNSAID) 2 % SOLN Place 1 application onto the skin 2 (two) times daily. (Patient not taking: Reported on 10/13/2020) 1 Bottle 3  . Multiple Vitamin (MULTIVITAMIN) tablet Take 1 tablet by mouth daily. (Patient not taking: Reported on 10/13/2020)    . traMADol (ULTRAM) 50 MG tablet Take 1 tablet (50 mg total) by mouth every 6 (six) hours as needed. (Patient not taking: Reported on 10/13/2020) 10 tablet 0   No facility-administered medications prior to visit.    Allergies  Allergen Reactions  . Erythromycin Other (See Comments)    Stomach pain  . B-Complex-B-12 [B Complex Vitamins] Itching and Rash    Itchy skin  . Tape Rash    ROS Review of Systems  Constitutional: Negative.   HENT: Negative.   Eyes: Negative  for photophobia and visual disturbance.  Respiratory: Negative.   Cardiovascular: Negative.   Gastrointestinal: Negative.   Endocrine: Negative for polyphagia and polyuria.  Genitourinary: Negative.   Musculoskeletal: Positive for arthralgias and gait problem.  Skin: Negative.   Neurological: Positive for numbness. Negative for weakness.  Hematological: Negative.   Psychiatric/Behavioral: Negative.    Depression screen [facility] 2/9 09/13/2019 11/26/2016 08/22/2016  Decreased Interest 0 0 0  Down, Depressed, Hopeless 0 0 0  PHQ - 2 Score 0 0 0  Some recent data might be hidden        Objective:    Physical Exam Vitals and nursing note reviewed.  Constitutional:      General: She is not in acute distress.    Appearance: Normal appearance. She is not ill-appearing, toxic-appearing or diaphoretic.  HENT:     Head: Normocephalic and atraumatic.     Right Ear: Tympanic membrane, ear canal and external ear normal.     Left Ear: Tympanic membrane, ear canal and external ear normal.     Mouth/Throat:     Mouth: Mucous membranes are moist.     Pharynx: Oropharynx is clear. No oropharyngeal exudate or posterior oropharyngeal erythema.  Eyes:     General: No scleral icterus.       Right eye: No discharge.        Left eye: No discharge.     Extraocular Movements: Extraocular movements intact.     Conjunctiva/sclera: Conjunctivae normal.     Pupils: Pupils are equal, round, and reactive to light.  Cardiovascular:     Rate and Rhythm: Normal rate and regular rhythm.  Pulmonary:     Effort: Pulmonary effort is normal.     Breath sounds: Normal breath sounds.  Abdominal:     General: Abdomen is flat. Bowel sounds are normal. There is no distension.     Palpations: There is no mass.     Tenderness: There is no abdominal tenderness. There is no guarding or rebound.     Hernia: No hernia is present.  Musculoskeletal:     Left wrist: No swelling, deformity, effusion, lacerations or bony  tenderness. Normal range of motion.       Arms:     Cervical back: No swelling, edema, deformity, erythema, lacerations, rigidity, spasms, torticollis, tenderness or bony tenderness. No pain with movement.       Back:     Right lower leg: No edema.     Left lower leg: No edema.  Lymphadenopathy:     Cervical: No cervical adenopathy.  Skin:    General: Skin is warm and dry.  Neurological:     Mental Status: She is alert and oriented to person, place, and time.  Psychiatric:        Mood and Affect: Mood normal.        Behavior: Behavior normal.     BP 122/70   Pulse 71   Temp (!) 96.8 F (36 C) (Tympanic)   Ht $R'5\' 8"'sz$  (1.727 m)   Wt 196 lb 6.4 oz (89.1 kg)   LMP 11/08/2013   SpO2 96%   BMI 29.86 kg/m  Wt Readings from Last 3 Encounters:  10/13/20 196 lb 6.4 oz (89.1 kg)  09/14/20 196 lb (88.9 kg)  09/13/19 201 lb 6.4 oz (91.4 kg)     Health Maintenance Due  Topic Date Due  . FOOT EXAM  07/25/2017  . HEMOGLOBIN A1C  03/12/2020  . URINE MICROALBUMIN  09/12/2020    There are no preventive care reminders to display for this patient.  Lab Results  Component Value Date   TSH 1.86 09/13/2019   Lab Results  Component Value Date   WBC 7.7 09/13/2019   HGB 12.0 09/13/2019   HCT 37.0 09/13/2019   MCV 82.7 09/13/2019   PLT 218.0 09/13/2019   Lab Results  Component Value Date   NA 140 09/13/2019   K 3.8 09/13/2019   CHLORIDE 103 01/17/2015  CO2 27 09/13/2019   GLUCOSE 100 (H) 09/13/2019   BUN 22 09/13/2019   CREATININE 0.66 09/13/2019   BILITOT 0.5 09/13/2019   ALKPHOS 29 (L) 09/13/2019   AST 27 09/13/2019   ALT 23 09/13/2019   PROT 6.8 09/13/2019   ALBUMIN 4.4 09/13/2019   CALCIUM 10.0 09/13/2019   ANIONGAP 10 01/17/2015   EGFR >90 01/17/2015   GFR 91.67 09/13/2019   Lab Results  Component Value Date   CHOL 190 09/13/2019   Lab Results  Component Value Date   HDL 46.30 09/13/2019   Lab Results  Component Value Date   LDLCALC 96 09/03/2018    Lab Results  Component Value Date   TRIG 211.0 (H) 09/13/2019   Lab Results  Component Value Date   CHOLHDL 4 09/13/2019   Lab Results  Component Value Date   HGBA1C 7.9 (H) 09/13/2019      Assessment & Plan:   Problem List Items Addressed This Visit      Endocrine   Type 2 diabetes mellitus with hyperglycemia, without long-term current use of insulin (HCC)   Relevant Orders   Comprehensive metabolic panel   Hemoglobin A1c   Microalbumin / creatinine urine ratio     Nervous and Auditory   Carpal tunnel syndrome of left wrist     Other   Healthcare maintenance - Primary   Relevant Orders   CBC   Comprehensive metabolic panel   Lipid panel   Urinalysis, Routine w reflex microscopic   Chronic pain of right knee      No orders of the defined types were placed in this encounter.   Follow-up: Return in about 6 months (around 04/13/2021).  Patient was given information on health maintenance and disease prevention.  We discussed losing weight.  She has a weight loss program through work that she is going to try.  Reminded her that all exercise is important for her dietary changes are the key to weight loss.  She was given information on chronic right knee pain.  She is currently undergoing sports medicine care for this problem.  She was also given information on carpal tunnel syndrome and exercises to do to prevent it.  Follow-up as needed.  Libby Maw, MD

## 2020-10-20 ENCOUNTER — Other Ambulatory Visit: Payer: Self-pay

## 2020-10-20 ENCOUNTER — Encounter: Payer: Self-pay | Admitting: Internal Medicine

## 2020-10-20 ENCOUNTER — Ambulatory Visit: Payer: No Typology Code available for payment source | Admitting: Internal Medicine

## 2020-10-20 ENCOUNTER — Other Ambulatory Visit: Payer: Self-pay | Admitting: Internal Medicine

## 2020-10-20 VITALS — BP 144/68 | HR 88 | Ht 68.0 in | Wt 195.4 lb

## 2020-10-20 DIAGNOSIS — E785 Hyperlipidemia, unspecified: Secondary | ICD-10-CM

## 2020-10-20 DIAGNOSIS — E1165 Type 2 diabetes mellitus with hyperglycemia: Secondary | ICD-10-CM | POA: Diagnosis not present

## 2020-10-20 DIAGNOSIS — E663 Overweight: Secondary | ICD-10-CM

## 2020-10-20 MED ORDER — RYBELSUS 7 MG PO TABS: 7.0000 mg | ORAL_TABLET | Freq: Every day | ORAL | 3 refills | Status: DC

## 2020-10-20 MED FILL — FENOFIBRATE 48 MG TABLET: 48 | 90 days supply | Qty: 90 | Fill #0

## 2020-10-20 MED FILL — RYBELSUS 7 MG TABS: 7 | 90 days supply | Qty: 90 | Fill #0

## 2020-10-20 MED FILL — TAMOXIFEN 20 MG TABLET: 20 | 90 days supply | Qty: 90 | Fill #0

## 2020-10-20 MED FILL — MELOXICAM 7.5 MG TABLET: 7.5 | 30 days supply | Qty: 30 | Fill #1

## 2020-10-20 NOTE — Patient Instructions (Addendum)
PPlease continue:  - Metformin ER 1500 mg with dinner - Glipizide 5 mg before dinner  Try to stop Januvia and start Rybelsus 7 mg before b'fast.  Please return in 3-4 months with your sugar log

## 2020-10-20 NOTE — Telephone Encounter (Signed)
Please advise. Rx have not been prescribed by provider. Last seen on Dr Shellia Cleverly on 10/20/2020. No future appointment schedule.

## 2020-10-20 NOTE — Progress Notes (Signed)
Patient ID: Veronica Rogers, female   DOB: [birth year], 60 y.o.   MRN: [id number]   This visit occurred during the SARS-CoV-2 public health emergency.  Safety protocols were in place, including screening questions prior to the visit, additional usage of staff PPE, and extensive cleaning of exam room while observing appropriate contact time as indicated for disinfecting solutions.   HPI: Veronica Rogers is a 59 y.o.-year-old female, presenting presenting for f/u for DM2, dx in 2015 (after steroids), non-insulin-dependent, uncontrolled, without long term complications. Last visit 1.5 years ago (virtual).  Reviewed HbA1c levels Lab Results  Component Value Date   HGBA1C 7.2 (H) 10/13/2020   HGBA1C 7.9 (H) 09/13/2019   HGBA1C 6.7 (A) 01/08/2019   Pt is on a regimen of:  - Metformin ER 1500 mg with dinner - Januvia 100 mg before breakfast - Glipizide 5 >>5  mg before dinner She was on insulin during ChTx. She was on regular metformin >> GI upset (diarrhea). She has frequent urination.  Pt checks her sugars 1-3 times a day: - am:  127-137 >> 123-134 >> 130s >> 119-140, 161 - 2h after b'fast:95-101 >> 120 >> n/c - before lunch: 90-110 >> 98-169 >> n/c >> 89 >> 109-114 - 2h after lunch:n/c >> 140-152 >> n/c >> 140-149 - before dinner: n/c >> 134, 135 >> n/c >> 122, 140, 186 - 2h after dinner:107-211 >> 152 >> n/c >> 160s >> n/c >> 189 - bedtime: n/c   - nighttime: 95-99 >> 106 >> 85, 89, 98, 120 >> 114 Lowest sugar was 93 >> 106 >> 85 >> 109;  she has hypoglycemia awareness in the 70s. Highest sugar was  229 (large sandwich) >> 160s >> 189.  Glucometer: True test >> AccuChek guide >> Freestyle Lite  Pt's meals are: - Breakfast: cereals or 2 eggs or skips - 10:30 am: PB crackers - Lunch: leftovers from home: sandwich; soup; meat + veggies; chinese; salad - Dinner: eats out most dinners! During the coronavirus pandemic, she was using her bicycle and walking more.  -No HL, last  BUN/creatinine:  Lab Results  Component Value Date   BUN 11 10/13/2020   CREATININE 0.83 10/13/2020   -+HL; last set of lipids: Lab Results  Component Value Date   CHOL 181 10/13/2020   HDL 73.80 10/13/2020   LDLCALC 93 10/13/2020   LDLDIRECT 116.0 09/13/2019   TRIG 72.0 10/13/2020   CHOLHDL 2 10/13/2020  Not on a statin.  Continues on fibrate 48. - last eye exam was on 10/03/2020: No DR. Dr. Katy Fitch. -She denies numbness and tingling in her feet.  She has a history of plantar fasciitis.  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 On Tamoxifen for 5 years >> she will finish the course in 2020.  ROS: Constitutional: no weight gain/no weight loss, no fatigue, no subjective hyperthermia, no subjective hypothermia Eyes: no blurry vision, no xerophthalmia ENT: no sore throat, no nodules palpated in neck, no dysphagia, no odynophagia, no hoarseness Cardiovascular: no CP/no SOB/no palpitations/no leg swelling Respiratory: no cough/no SOB/no wheezing Gastrointestinal: no N/no V/no D/no C/no acid reflux Musculoskeletal: no muscle aches/no joint aches Skin: no rashes, no hair loss Neurological: no tremors/no numbness/no tingling/no dizziness  I reviewed pt's medications, allergies, PMH, social hx, family hx, and changes were documented in the history of present illness. Otherwise, unchanged from my initial visit note.  Past Medical History:  Diagnosis Date  . Anxiety   . Breast cancer ([place])   . Contact lens/glasses fitting  wears contacts or glasses  . Depression   . Diabetes ([place]) 11/22/2014  . Hyperlipidemia   . Personal history of chemotherapy   . Personal history of radiation therapy   . Radiation 03/28/14-05/06/14   Left Breast 60 Gy   Past Surgical History:  Procedure Laterality Date  . BREAST BIOPSY  1996   right breast, benign  . BREAST LUMPECTOMY WITH NEEDLE LOCALIZATION AND AXILLARY SENTINEL LYMPH NODE BX Left 10/29/2013   Procedure: BREAST LUMPECTOMY WITH NEEDLE LOCALIZATION AND AXILLARY  SENTINEL LYMPH NODE BX;  Surgeon: Rolm Bookbinder, MD;  Location: [facility];  Service: General;  Laterality: Left;  . COLONOSCOPY  05/2011   WNL  . WISDOM TOOTH EXTRACTION     Social History   Social History  . Marital Status: Married    Spouse Name: N/A  . Number of Children: 2   Occupational History  . [employer] - support rep   Social History Main Topics  . Smoking status: Never Smoker   . Smokeless tobacco: Never Used  . Alcohol Use: Yes     Comment: 1-2 drinks a month  . Drug Use: No   Current Outpatient Medications on File Prior to Visit  Medication Sig Dispense Refill  . cyclobenzaprine (FLEXERIL) 5 MG tablet Take 1 tablet (5 mg total) by mouth 3 (three) times daily as needed for muscle spasms. (Patient not taking: Reported on 10/13/2020) 30 tablet 0  . Diclofenac Sodium (PENNSAID) 2 % SOLN Place 1 application onto the skin 2 (two) times daily. (Patient not taking: Reported on 10/13/2020) 1 Bottle 3  . fenofibrate (TRICOR) 48 MG tablet Take 1 tablet (48 mg total) by mouth daily. 90 tablet 3  . glipiZIDE (GLUCOTROL) 5 MG tablet TAKE 1 TO 2 TABLETS BY MOUTH DAILY WITH EVENING MEAL 180 tablet 0  . glucose blood (FREESTYLE TEST STRIPS) test strip UAD for bid blood glucose testing Dx: E11.65 100 each 12  . glucose monitoring kit (FREESTYLE) monitoring kit 1 each by Does not apply route as needed for other. UAD for bid blood glucose testing Dx: E11.65 1 each 1  . Ibuprofen-Famotidine 800-26.6 MG TABS Take 1 tablet by mouth 3 (three) times daily. 90 tablet 3  . Lancets (FREESTYLE) lancets UAD for bid blood glucose testing Dx: E11.65 100 each 12  . meloxicam (MOBIC) 7.5 MG tablet May take one daily as needed with food. 30 tablet 2  . metFORMIN (GLUCOPHAGE-XR) 500 MG 24 hr tablet Take 3 tablets daily with dinner. 270 tablet 0  . metFORMIN (GLUCOPHAGE-XR) 750 MG 24 hr tablet TAKE 2 TABLETS BY MOUTH DAILY AT BEDTIME 180 tablet 3  . Multiple Vitamin  (MULTIVITAMIN) tablet Take 1 tablet by mouth daily. (Patient not taking: Reported on 10/13/2020)    . sertraline (ZOLOFT) 50 MG tablet Take 50 mg by mouth every morning.     . sitaGLIPtin (JANUVIA) 100 MG tablet TAKE 1 TABLET (100 MG TOTAL) BY MOUTH DAILY. 90 tablet 0  . tamoxifen (NOLVADEX) 20 MG tablet TAKE 1 TABLET BY MOUTH DAILY. 90 tablet 3  . traMADol (ULTRAM) 50 MG tablet Take 1 tablet (50 mg total) by mouth every 6 (six) hours as needed. (Patient not taking: Reported on 10/13/2020) 10 tablet 0  . VENTOLIN HFA 108 (90 Base) MCG/ACT inhaler INHALE 1-2 PUFFS INTO THE LUNGS EVERY 6 HOURS AS NEEDED FOR WHEEZING OR SHORTNESS OF BREATH. 18 g 1   No current facility-administered medications on file prior to visit.  Allergies  Allergen Reactions  . Erythromycin Other (See Comments)    Stomach pain  . B-Complex-B-12 [B Complex Vitamins] Itching and Rash    Itchy skin  . Tape Rash   Family History  Problem Relation Age of Onset  . Squamous cell carcinoma Father   . Cancer Father   . Diabetes Father   . Hypertension Mother   . Diabetes Sister    PE: BP (!) 144/68 (BP Location: Right Arm, Patient Position: Sitting, Cuff Size: Normal)   Pulse 88   Ht $R'5\' 8"'fx$  (1.727 m)   Wt 195 lb 6.4 oz (88.6 kg)   LMP 11/08/2013   SpO2 97%   BMI 29.71 kg/m  Body mass index is 29.71 kg/m. Wt Readings from Last 3 Encounters:  10/20/20 195 lb 6.4 oz (88.6 kg)  10/13/20 196 lb 6.4 oz (89.1 kg)  09/14/20 196 lb (88.9 kg)   Constitutional: overweight, in NAD Eyes: PERRLA, EOMI, no exophthalmos ENT: moist mucous membranes, no thyromegaly, no cervical lymphadenopathy Cardiovascular: RRR, No MRG Respiratory: CTA B Gastrointestinal: abdomen soft, NT, ND, BS+ Musculoskeletal: no deformities, strength intact in all 4 Skin: moist, warm, no rashes Neurological: no tremor with outstretched hands, DTR normal in all 4  ASSESSMENT: 1. DM2, non-insulin-dependent, uncontrolled, without long term  complications, but with hyperglycemia  No FH of MTC or personal hx of pancreatitis.  2. HL  3.  Overweight  PLAN:  1. Patient with longstanding, uncontrolled type 2 diabetes, on Metformin, sulfonylurea, and DPP 4 inhibitors.  She repeatedly refused DPP 4 inhibitors or other injectables in the past.  Her sugars actually improved with improvement of her diet with the last A1c being 7.2%, obtained earlier this month.  This is an improvement from 7.9%.  At last visit, I did not make drastic changes in her regimen but I did advise her to take a lower dose of sulfonylurea before a smaller dinner to avoid low blood sugars at night. -At today's visit, sugars are mostly slightly higher than goal at all times of the day.  No low blood sugars or significant hyperglycemic spikes.  We discussed about switching from the DPP 4 inhibitor to an oral GLP-1 receptor agonist (Rybelsus).  She does agree with this.  Discussed about benefits and possible side effects.  We will start with 7 mg and may need to increase the dose as needed.  For now we will continue the same dose of Metformin and glipizide. - I suggested to:  Patient Instructions  PPlease continue:  - Metformin ER 1500 mg with dinner - Glipizide 5 mg before dinner  Try to stop Januvia and start Rybelsus 7 mg before b'fast.  Please return in 3-4 months with your sugar log  - advised to check sugars at different times of the day - 1x a day, rotating check times - advised for yearly eye exams >> she is UTD - return to clinic in 4 months    2. HL -Reviewed latest lipid panel from 09/2020: LDL improved, now lower than 100, the rest of the fractions also at goal Lab Results  Component Value Date   CHOL 181 10/13/2020   HDL 73.80 10/13/2020   LDLCALC 93 10/13/2020   LDLDIRECT 116.0 09/13/2019   TRIG 72.0 10/13/2020   CHOLHDL 2 10/13/2020  -Thyroid triglycerides were higher in the 700s in the past  -Now more compliant with fenofibrate 48 mg but she  is inconsistent taking it.  She tolerates this well.  I advised her to  try to take it consistently.  3.  Overweight -Continues on Metformin which is weight neutral, but will switch to Rybelsus, which can also help with weight loss -At our last in person visit, weight was 198 pounds >> lost 3 pounds since then  Philemon Kingdom, MD PhD [facility]

## 2020-10-30 ENCOUNTER — Encounter: Payer: Self-pay | Admitting: Internal Medicine

## 2020-11-21 MED FILL — OXYBUTYNIN CL ER 10 MG TAB: 10 | 90 days supply | Qty: 90 | Fill #2

## 2020-11-27 ENCOUNTER — Encounter: Payer: Self-pay | Admitting: Family Medicine

## 2020-11-28 ENCOUNTER — Other Ambulatory Visit: Payer: Self-pay | Admitting: Nurse Practitioner

## 2020-11-28 ENCOUNTER — Telehealth (INDEPENDENT_AMBULATORY_CARE_PROVIDER_SITE_OTHER): Payer: No Typology Code available for payment source | Admitting: Nurse Practitioner

## 2020-11-28 ENCOUNTER — Telehealth: Payer: Self-pay | Admitting: Family Medicine

## 2020-11-28 ENCOUNTER — Encounter: Payer: Self-pay | Admitting: Nurse Practitioner

## 2020-11-28 VITALS — Ht 68.0 in | Wt 190.0 lb

## 2020-11-28 DIAGNOSIS — J209 Acute bronchitis, unspecified: Secondary | ICD-10-CM | POA: Diagnosis not present

## 2020-11-28 MED ORDER — ALBUTEROL SULFATE HFA 108 (90 BASE) MCG/ACT IN AERS: 1.0000 | INHALATION_SPRAY | Freq: Four times a day (QID) | RESPIRATORY_TRACT | 1 refills | Status: DC | PRN

## 2020-11-28 MED ORDER — PSEUDOEPHEDRINE-CODEINE-GG 30-10-100 MG/5ML PO SOLN: 5.0000 mL | Freq: Three times a day (TID) | ORAL | 0 refills | Status: DC | PRN

## 2020-11-28 MED ORDER — PREDNISONE 10 MG PO TABS: 30.0000 mg | ORAL_TABLET | Freq: Every day | ORAL | 0 refills | Status: DC

## 2020-11-28 MED FILL — ALBUTEROL SULFATE HFA 108 (: 108 (90 BAS | 25 days supply | Qty: 18 | Fill #0

## 2020-11-28 MED FILL — METFORMIN HCL ER 750 MG TAB: 750 | 90 days supply | Qty: 180 | Fill #1

## 2020-11-28 MED FILL — predniSONE 10 MG TABS: 10 | 2 days supply | Qty: 6 | Fill #0

## 2020-11-28 NOTE — Telephone Encounter (Signed)
Caller Name: [place] Call back phone #: [phone]  [facility] pharmacies do not have this medication and cannot fill. Pt needs it sent to a different pharmacy.  MEDICATION(S): pseudoephedrine-codeine-guaifenesin (MYTUSSIN DAC) 30-10-100 MG/5ML solution   Preferred Pharmacy:  [id number] - [place], [place] Phone:  [phone]  Fax:  [phone]

## 2020-11-28 NOTE — Telephone Encounter (Signed)
Resent rx to [facility]

## 2020-11-28 NOTE — Progress Notes (Signed)
Virtual Visit via Video Note I connected with@ on 11/28/20 at  8:15 AM EST by a video enabled telemedicine application and verified that I am speaking with the correct person using two identifiers.  Location: Patient:Home Provider: Office Participants: patient and provider  I discussed the limitations of evaluation and management by telemedicine and the availability of in person appointments. I also discussed with the patient that there may be a patient responsible charge related to this service. The patient expressed understanding and agreed to proceed.  CC: Pt c/o productive cough with clear mucus x1.5 weeks, along with some chest congestion. Pt states she originally started with a sore throat during the early part of last week but that has went away. Pt has been taking otc cough medication along with cough drops.   History of Present Illness: Fasting glucose this AM:160 States she has not been compliant with Dm medications for last 3days. Last HgbA1c of 7.2 (10/13/2020) Cough This is a new problem. The current episode started 1 to 4 weeks ago. The problem has been unchanged. The problem occurs constantly. The cough is non-productive. Associated symptoms include nasal congestion and postnasal drip. Pertinent negatives include no chest pain, chills, fever, headaches, heartburn, hemoptysis, myalgias, rash, rhinorrhea, sore throat, shortness of breath or wheezing. The symptoms are aggravated by cold air and fumes (has wood burning stove in her home). She has tried OTC cough suppressant for the symptoms. The treatment provided no relief. Her past medical history is significant for bronchitis and environmental allergies.   Observations/Objective: Physical Exam Pulmonary:     Effort: Pulmonary effort is normal.  Neurological:     Mental Status: She is alert and oriented to person, place, and time.    Assessment and Plan: Karlie was seen today for acute visit.  Diagnoses and all orders for  this visit:  Acute bronchitis, unspecified organism -     albuterol (VENTOLIN HFA) 108 (90 Base) MCG/ACT inhaler; Inhale 1-2 puffs into the lungs every 6 (six) hours as needed for wheezing or shortness of breath. -     predniSONE (DELTASONE) 10 MG tablet; Take 3 tablets (30 mg total) by mouth daily with breakfast. -     pseudoephedrine-codeine-guaifenesin (MYTUSSIN DAC) 30-10-100 MG/5ML solution; Take 5 mLs by mouth every 8 (eight) hours as needed for cough.   Follow Up Instructions: See above Advised about the importance of medication compliance and possible elevated glucose with oral prednisone. Avoid high carb and high sugar meals/drinks. Monitor glucose daily. Call office if glucose >250. Call office if no improvement in 1week   I discussed the assessment and treatment plan with the patient. The patient was provided an opportunity to ask questions and all were answered. The patient agreed with the plan and demonstrated an understanding of the instructions.   The patient was advised to call back or seek an in-person evaluation if the symptoms worsen or if the condition fails to improve as anticipated.  Wilfred Lacy, NP

## 2020-11-28 NOTE — Patient Instructions (Signed)
See above Advised about the importance of medication compliance and possible elevated glucose with oral prednisone. Avoid high carb and high sugar meals/drinks. Monitor glucose daily. Call office if glucose >250. Call office if no improvement in 1week

## 2020-12-01 ENCOUNTER — Ambulatory Visit: Payer: No Typology Code available for payment source

## 2020-12-05 ENCOUNTER — Encounter: Payer: Self-pay | Admitting: Family

## 2020-12-05 ENCOUNTER — Other Ambulatory Visit: Payer: Self-pay

## 2020-12-05 ENCOUNTER — Ambulatory Visit (INDEPENDENT_AMBULATORY_CARE_PROVIDER_SITE_OTHER): Payer: No Typology Code available for payment source | Admitting: Family

## 2020-12-05 VITALS — BP 122/70 | HR 73 | Temp 97.3°F | Ht 68.0 in | Wt 190.0 lb

## 2020-12-05 DIAGNOSIS — L723 Sebaceous cyst: Secondary | ICD-10-CM | POA: Diagnosis not present

## 2020-12-05 NOTE — Progress Notes (Signed)
Acute Office Visit  Subjective:    Patient ID: Veronica Rogers, female    DOB: 1961-03-18, 59 y.o.   MRN: [id number]  Chief Complaint  Patient presents with  . Cyst    cyst on left upper back x 1 week, patient states that she fell the other day hit her head would like knot checked.     HPI Patient is in today with concerns of a cyst on her left upper back x 1 week that is sore and getting worse.   Past Medical History:  Diagnosis Date  . Anxiety   . Breast cancer ([place])   . Contact lens/glasses fitting    wears contacts or glasses  . Depression   . Diabetes ([place]) 11/22/2014  . Hyperlipidemia   . Personal history of chemotherapy   . Personal history of radiation therapy   . Radiation 03/28/14-05/06/14   Left Breast 60 Gy    Past Surgical History:  Procedure Laterality Date  . BREAST BIOPSY  1996   right breast, benign  . BREAST LUMPECTOMY WITH NEEDLE LOCALIZATION AND AXILLARY SENTINEL LYMPH NODE BX Left 10/29/2013   Procedure: BREAST LUMPECTOMY WITH NEEDLE LOCALIZATION AND AXILLARY SENTINEL LYMPH NODE BX;  Surgeon: Rolm Bookbinder, MD;  Location: [facility];  Service: General;  Laterality: Left;  . COLONOSCOPY  05/2011   WNL  . WISDOM TOOTH EXTRACTION      Family History  Problem Relation Age of Onset  . Squamous cell carcinoma Father   . Cancer Father   . Diabetes Father   . Hypertension Mother   . Diabetes Sister     Social History   Socioeconomic History  . Marital status: Married    Spouse name: Not on file  . Number of children: Not on file  . Years of education: Not on file  . Highest education level: Not on file  Occupational History  . Not on file  Tobacco Use  . Smoking status: Never Smoker  . Smokeless tobacco: Never Used  Substance and Sexual Activity  . Alcohol use: Yes    Comment: 1-2 drinks a month  . Drug use: No  . Sexual activity: Yes  Other Topics Concern  . Not on file  Social History Narrative  . Not on file    Social Determinants of Health   Financial Resource Strain:   . Difficulty of Paying Living Expenses: Not on file  Food Insecurity:   . Worried About Charity fundraiser in the Last Year: Not on file  . Ran Out of Food in the Last Year: Not on file  Transportation Needs:   . Lack of Transportation (Medical): Not on file  . Lack of Transportation (Non-Medical): Not on file  Physical Activity:   . Days of Exercise per Week: Not on file  . Minutes of Exercise per Session: Not on file  Stress:   . Feeling of Stress : Not on file  Social Connections:   . Frequency of Communication with Friends and Family: Not on file  . Frequency of Social Gatherings with Friends and Family: Not on file  . Attends Religious Services: Not on file  . Active Member of Clubs or Organizations: Not on file  . Attends Archivist Meetings: Not on file  . Marital Status: Not on file  Intimate Partner Violence:   . Fear of Current or Ex-Partner: Not on file  . Emotionally Abused: Not on file  . Physically Abused: Not on file  .  Sexually Abused: Not on file    Outpatient Medications Prior to Visit  Medication Sig Dispense Refill  . albuterol (VENTOLIN HFA) 108 (90 Base) MCG/ACT inhaler Inhale 1-2 puffs into the lungs every 6 (six) hours as needed for wheezing or shortness of breath. 18 g 1  . fenofibrate (TRICOR) 48 MG tablet TAKE 1 TABLET (48 MG TOTAL) BY MOUTH DAILY. 90 tablet 3  . glipiZIDE (GLUCOTROL) 5 MG tablet TAKE 1 TO 2 TABLETS BY MOUTH DAILY WITH EVENING MEAL 180 tablet 0  . glucose blood (FREESTYLE TEST STRIPS) test strip UAD for bid blood glucose testing Dx: E11.65 100 each 12  . glucose monitoring kit (FREESTYLE) monitoring kit 1 each by Does not apply route as needed for other. UAD for bid blood glucose testing Dx: E11.65 1 each 1  . Ibuprofen-Famotidine 800-26.6 MG TABS Take 1 tablet by mouth 3 (three) times daily. 90 tablet 3  . Lancets (FREESTYLE) lancets UAD for bid blood glucose  testing Dx: E11.65 100 each 12  . meloxicam (MOBIC) 7.5 MG tablet May take one daily as needed with food. 30 tablet 2  . metFORMIN (GLUCOPHAGE-XR) 750 MG 24 hr tablet TAKE 2 TABLETS BY MOUTH DAILY AT BEDTIME 180 tablet 3  . oxybutynin (DITROPAN-XL) 10 MG 24 hr tablet Take 10 mg by mouth every morning.    . sertraline (ZOLOFT) 50 MG tablet Take 50 mg by mouth every morning.     . tamoxifen (NOLVADEX) 20 MG tablet TAKE 1 TABLET BY MOUTH DAILY. 90 tablet 3  . pseudoephedrine-codeine-guaifenesin (MYTUSSIN DAC) 30-10-100 MG/5ML solution Take 5 mLs by mouth every 8 (eight) hours as needed for cough. (Patient not taking: Reported on 12/05/2020) 100 mL 0  . Semaglutide (RYBELSUS) 7 MG TABS Take 7 mg by mouth daily before breakfast. (Patient not taking: Reported on 11/28/2020) 90 tablet 3  . traMADol (ULTRAM) 50 MG tablet Take 1 tablet (50 mg total) by mouth every 6 (six) hours as needed. (Patient not taking: Reported on 12/05/2020) 10 tablet 0  . cyclobenzaprine (FLEXERIL) 5 MG tablet Take 1 tablet (5 mg total) by mouth 3 (three) times daily as needed for muscle spasms. (Patient not taking: Reported on 12/05/2020) 30 tablet 0  . Diclofenac Sodium (PENNSAID) 2 % SOLN Place 1 application onto the skin 2 (two) times daily. (Patient not taking: Reported on 12/05/2020) 1 Bottle 3  . Multiple Vitamin (MULTIVITAMIN) tablet Take 1 tablet by mouth daily.  (Patient not taking: Reported on 12/05/2020)    . predniSONE (DELTASONE) 10 MG tablet Take 3 tablets (30 mg total) by mouth daily with breakfast. (Patient not taking: Reported on 12/05/2020) 6 tablet 0   No facility-administered medications prior to visit.    Allergies  Allergen Reactions  . Erythromycin Other (See Comments)    Stomach pain  . B-Complex-B-12 [B Complex Vitamins] Itching and Rash    Itchy skin  . Tape Rash    Review of Systems  Constitutional: Negative.   Respiratory: Negative.   Cardiovascular: Negative.   Skin:       Cyst on the left upper  back  Psychiatric/Behavioral: Negative.   All other systems reviewed and are negative.      Objective:    Physical Exam Constitutional:      Appearance: She is normal weight.  Cardiovascular:     Rate and Rhythm: Normal rate and regular rhythm.     Pulses: Normal pulses.     Heart sounds: Normal heart sounds.  Pulmonary:  Effort: Pulmonary effort is normal.     Breath sounds: Normal breath sounds.  Skin:      Neurological:     Mental Status: She is alert.     BP 122/70   Pulse 73   Temp (!) 97.3 F (36.3 C) (Tympanic)   Ht $R'5\' 8"'QE$  (1.727 m)   Wt 190 lb (86.2 kg)   LMP 11/08/2013   SpO2 94%   BMI 28.89 kg/m  Wt Readings from Last 3 Encounters:  12/05/20 190 lb (86.2 kg)  11/28/20 190 lb (86.2 kg)  10/20/20 195 lb 6.4 oz (88.6 kg)    Health Maintenance Due  Topic Date Due  . FOOT EXAM  07/25/2017    There are no preventive care reminders to display for this patient.   Lab Results  Component Value Date   TSH 1.86 09/13/2019   Lab Results  Component Value Date   WBC 5.9 10/13/2020   HGB 11.4 (L) 10/13/2020   HCT 33.9 (L) 10/13/2020   MCV 92.1 10/13/2020   PLT 248.0 10/13/2020   Lab Results  Component Value Date   NA 139 10/13/2020   K 4.4 10/13/2020   CHLORIDE 103 01/17/2015   CO2 29 10/13/2020   GLUCOSE 90 10/13/2020   BUN 11 10/13/2020   CREATININE 0.83 10/13/2020   BILITOT 0.6 10/13/2020   ALKPHOS 67 10/13/2020   AST 20 10/13/2020   ALT 11 10/13/2020   PROT 6.9 10/13/2020   ALBUMIN 4.3 10/13/2020   CALCIUM 9.4 10/13/2020   ANIONGAP 10 01/17/2015   EGFR >90 01/17/2015   GFR 76.61 10/13/2020   Lab Results  Component Value Date   CHOL 181 10/13/2020   Lab Results  Component Value Date   HDL 73.80 10/13/2020   Lab Results  Component Value Date   LDLCALC 93 10/13/2020   Lab Results  Component Value Date   TRIG 72.0 10/13/2020   Lab Results  Component Value Date   CHOLHDL 2 10/13/2020   Lab Results  Component Value Date    HGBA1C 7.2 (H) 10/13/2020       Assessment & Plan:   Problem List Items Addressed This Visit    None    Visit Diagnoses    Sebaceous cyst    -  Primary       No orders of the defined types were placed in this encounter.  Instructions provided for wound care. Call with any concerns.    Kennyth Arnold, FNP

## 2020-12-05 NOTE — Patient Instructions (Signed)
Epidermal Cyst Removal, Care After This sheet gives you information about how to care for yourself after your procedure. Your health care provider may also give you more specific instructions. If you have problems or questions, contact your health care provider. What can I expect after the procedure? After the procedure, it is common to have:  Soreness in the area where your cyst was removed.  Tightness or itchiness from the stitches (sutures) in your skin. Follow these instructions at home: Medicines  Take over-the-counter and prescription medicines only as told by your health care provider.  If you were prescribed an antibiotic medicine or ointment, take or apply it as told by your health care provider. Do not stop using the antibiotic even if you start to feel better. Incision care   Follow instructions from your health care provider about how to take care of your incision. Make sure you: ? Wash your hands with soap and water before you change your bandage (dressing). If soap and water are not available, use hand sanitizer. ? Change your dressing as told by your health care provider. ? Leave sutures, skin glue, or adhesive strips in place. These skin closures may need to stay in place for 1-2 weeks or longer. If adhesive strip edges start to loosen and curl up, you may trim the loose edges. Do not remove adhesive strips completely unless your health care provider tells you to do that.  Keep the dressingdry until your health care provider says that it can be removed.  After your dressing is off, check your incision area every day for signs of infection. Check for: ? Redness, swelling, or pain. ? Fluid or blood. ? Warmth. ? Pus or a bad smell. General instructions  Do not take baths, swim, or use a hot tub until your health care provider approves. Ask your health care provider if you may take showers. You may only be allowed to take sponge baths.  Your health care provider may ask  you to avoid contact sports or activities that take a lot of effort. Do not do anything that stretches or puts pressure on your incision.  You can return to your normal diet.  Keep all follow-up visits as told by your health care provider. This is important. Contact a health care provider if:  You have a fever.  You have redness, swelling, or pain in the incision area.  You have fluid or blood coming from your incision.  You have pus or a bad smell coming from your incision.  Your incision feels warm to the touch.  Your cyst grows back. Summary  After the procedure, it is common to have soreness in the area where your cyst was removed.  Take or apply over-the-counter and prescription medicines only as told by your health care provider.  Follow instructions from your health care provider about how to take care of your incision. This information is not intended to replace advice given to you by your health care provider. Make sure you discuss any questions you have with your health care provider. Document Revised: 04/07/2018 Document Reviewed: 10/09/2017 Elsevier Patient Education  [place].

## 2020-12-08 ENCOUNTER — Other Ambulatory Visit (HOSPITAL_BASED_OUTPATIENT_CLINIC_OR_DEPARTMENT_OTHER): Payer: Self-pay | Admitting: Internal Medicine

## 2020-12-08 ENCOUNTER — Ambulatory Visit: Payer: No Typology Code available for payment source | Attending: Internal Medicine

## 2020-12-08 DIAGNOSIS — Z23 Encounter for immunization: Secondary | ICD-10-CM

## 2020-12-08 NOTE — Progress Notes (Signed)
   Covid-19 Vaccination Clinic  Name:  SHAQUOYA COSPER    MRN: [id number] DOB: 1961/03/22  12/08/2020  Ms. Pendry was observed post Covid-19 immunization for 15 minutes without incident. She was provided with Vaccine Information Sheet and instruction to access the V-Safe system.   Ms. Kizer was instructed to call 911 with any severe reactions post vaccine: Marland Kitchen Difficulty breathing  . Swelling of face and throat  . A fast heartbeat  . A bad rash all over body  . Dizziness and weakness   Immunizations Administered    Name Date Dose VIS Date Route   Pfizer COVID-19 Vaccine 12/08/2020 11:10 AM 0.3 mL 10/18/2020 Intramuscular   Manufacturer: [employer], [employer]   Lot: [id number]   [place]: [id number]

## 2020-12-12 MED FILL — PFIZER-BIONTECH COVID-19 VA: 30 | 1 days supply | Qty: 0 | Fill #0

## 2020-12-13 ENCOUNTER — Encounter: Payer: Self-pay | Admitting: Nurse Practitioner

## 2020-12-14 ENCOUNTER — Other Ambulatory Visit (HOSPITAL_COMMUNITY): Payer: Self-pay | Admitting: Family

## 2020-12-14 MED ORDER — GUAIFENESIN-CODEINE 100-10 MG/5ML PO SOLN
5.0000 mL | Freq: Four times a day (QID) | ORAL | 0 refills | Status: DC | PRN
Start: 2020-12-14 — End: 2021-03-26

## 2020-12-14 MED FILL — GUAIATUSSIN AC LIQUID: 100-10 | 6 days supply | Qty: 120 | Fill #0

## 2020-12-14 NOTE — Telephone Encounter (Signed)
Spoke to the [facility], they don't carry the cough syrup that was sent in.  They advised sending in Guaifenesin w/codiene. Patient would like to have this sent to the [facility] outpatient pharmacy.    Please advise.  Thanks. Dm/cma

## 2020-12-15 MED FILL — SERTRALINE HCL 50 MG TABLET: 50 | 90 days supply | Qty: 90 | Fill #2

## 2021-01-11 ENCOUNTER — Other Ambulatory Visit (HOSPITAL_COMMUNITY): Payer: Self-pay | Admitting: Obstetrics & Gynecology

## 2021-01-11 MED FILL — HYDROCORTISONE ACE-PRAMOXIN: 2.5-1 | 14 days supply | Qty: 30 | Fill #0

## 2021-01-18 MED FILL — MELOXICAM 7.5 MG TABLET: 7.5 | 30 days supply | Qty: 30 | Fill #2

## 2021-01-18 MED FILL — TAMOXIFEN 20 MG TABLET: 20 | 90 days supply | Qty: 90 | Fill #1

## 2021-01-18 MED FILL — RYBELSUS 7 MG TABS: 7 | 90 days supply | Qty: 90 | Fill #1

## 2021-01-18 MED FILL — glipiZIDE 5 MG TABS: 5 | 90 days supply | Qty: 180 | Fill #0

## 2021-03-05 ENCOUNTER — Other Ambulatory Visit: Payer: Self-pay | Admitting: Family Medicine

## 2021-03-05 MED FILL — OXYBUTYNIN CL ER 10 MG TAB: 10 | 90 days supply | Qty: 90 | Fill #0

## 2021-03-05 MED FILL — MELOXICAM 7.5 MG TABLET: 7.5 | 30 days supply | Qty: 30 | Fill #0

## 2021-03-21 ENCOUNTER — Encounter: Payer: Self-pay | Admitting: Family Medicine

## 2021-03-21 DIAGNOSIS — Z0000 Encounter for general adult medical examination without abnormal findings: Secondary | ICD-10-CM

## 2021-03-26 ENCOUNTER — Other Ambulatory Visit: Payer: Self-pay

## 2021-03-26 ENCOUNTER — Ambulatory Visit (INDEPENDENT_AMBULATORY_CARE_PROVIDER_SITE_OTHER): Payer: No Typology Code available for payment source | Admitting: Family Medicine

## 2021-03-26 ENCOUNTER — Encounter: Payer: Self-pay | Admitting: Family Medicine

## 2021-03-26 VITALS — BP 122/80 | HR 74 | Temp 97.9°F | Ht 68.0 in | Wt 197.4 lb

## 2021-03-26 DIAGNOSIS — S0922XA Traumatic rupture of left ear drum, initial encounter: Secondary | ICD-10-CM | POA: Diagnosis not present

## 2021-03-26 MED FILL — SERTRALINE HCL 50 MG TABLET: 50 | 90 days supply | Qty: 90 | Fill #3

## 2021-03-26 NOTE — Progress Notes (Signed)
[facility] [address] [place] [address] Dept: [phone] Dept Fax: [phone]  Office Visit  Subjective:    Patient ID: Veronica Rogers, female    DOB: 16-Sep-1961, 60 y.o..   MRN: [id number]  Chief Complaint  Patient presents with  . Acute Visit    C/o pushing a Q-tip in her LT ear x 2 days. Just wants to have it checked out.      History of Present Illness:  Patient is in today for evaluation of her left ear. She notes she was using a Q-tip swab in the ear on Friday while reaching for her pocketbook. As she was trying to grab the pocketbook, she accidentally jammed the swab deeper int he ear than intended. She has seen a small amount of blood. She also has tried popping her ears. She and her husband could appreciate a whistling sound. The ear is less painful now. She notes she worries about ear wax build up and about getting water out of her ears after showers.  Past Medical History: Patient Active Problem List   Diagnosis Date Noted  . Carpal tunnel syndrome of left wrist 10/13/2020  . Chronic pain of right knee 10/13/2020  . Healthcare maintenance 09/13/2019  . OA (osteoarthritis) of knee 11/11/2018  . Hyperlipidemia 08/26/2017  . Type 2 diabetes mellitus with hyperglycemia, without long-term current use of insulin ([place]) 01/08/2016  . Allergic rhinitis 11/22/2014  . 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[place]) 10/14/2013   Past Surgical History:  Procedure Laterality Date  . BREAST BIOPSY  1996   right breast, benign  . BREAST LUMPECTOMY WITH NEEDLE LOCALIZATION AND AXILLARY SENTINEL LYMPH NODE BX Left 10/29/2013   Procedure: BREAST LUMPECTOMY WITH NEEDLE LOCALIZATION AND AXILLARY SENTINEL LYMPH NODE BX;  Surgeon: Rolm Bookbinder, MD;  Location: [facility];  Service: General;  Laterality: Left;  . COLONOSCOPY  05/2011   WNL  . WISDOM TOOTH EXTRACTION     Family History   Problem Relation Age of Onset  . Squamous cell carcinoma Father   . Cancer Father   . Diabetes Father   . Hypertension Mother   . Diabetes Sister    Outpatient Medications Prior to Visit  Medication Sig Dispense Refill  . albuterol (VENTOLIN HFA) 108 (90 Base) MCG/ACT inhaler Inhale 1-2 puffs into the lungs every 6 (six) hours as needed for wheezing or shortness of breath. 18 g 1  . fenofibrate (TRICOR) 48 MG tablet TAKE 1 TABLET (48 MG TOTAL) BY MOUTH DAILY. 90 tablet 3  . glipiZIDE (GLUCOTROL) 5 MG tablet TAKE 1 TO 2 TABLETS BY MOUTH DAILY WITH EVENING MEAL 180 tablet 0  . glucose blood (FREESTYLE TEST STRIPS) test strip UAD for bid blood glucose testing Dx: E11.65 100 each 12  . glucose monitoring kit (FREESTYLE) monitoring kit 1 each by Does not apply route as needed for other. UAD for bid blood glucose testing Dx: E11.65 1 each 1  . Lancets (FREESTYLE) lancets UAD for bid blood glucose testing Dx: E11.65 100 each 12  . meloxicam (MOBIC) 7.5 MG tablet TAKE 1 TABLET BY MOUTH ONCE A DAY AS NEEDED WITH FOOD 30 tablet 2  . metFORMIN (GLUCOPHAGE-XR) 750 MG 24 hr tablet TAKE 2 TABLETS BY MOUTH DAILY AT BEDTIME 180 tablet 3  . oxybutynin (DITROPAN-XL) 10 MG 24 hr tablet Take 10 mg by mouth every morning.    . Semaglutide (RYBELSUS) 7 MG TABS Take 7 mg  by mouth daily before breakfast. 90 tablet 3  . sertraline (ZOLOFT) 50 MG tablet Take 50 mg by mouth every morning.     . tamoxifen (NOLVADEX) 20 MG tablet TAKE 1 TABLET BY MOUTH DAILY. 90 tablet 3  . Pramoxine-HC (HYDROCORTISONE ACE-PRAMOXINE) 2.5-1 % CREA hydrocortisone-pramoxine 2.5 %-1 % rectal cream  APPLY TO THE AFFECTED AREA(S) UP TO 2 TIMES DAILY    . guaiFENesin-codeine 100-10 MG/5ML syrup Take 5 mLs by mouth every 6 (six) hours as needed for cough. (Patient not taking: Reported on 03/26/2021) 120 mL 0  . traMADol (ULTRAM) 50 MG tablet Take 1 tablet (50 mg total) by mouth every 6 (six) hours as needed. (Patient not taking: No sig  reported) 10 tablet 0   No facility-administered medications prior to visit.   Allergies  Allergen Reactions  . Erythromycin Other (See Comments)    Stomach pain  . B-Complex-B-12 [B Complex Vitamins] Itching and Rash    Itchy skin  . Tape Rash    Objective:   Today's Vitals   03/26/21 1605  BP: 122/80  Pulse: 74  Temp: 97.9 F (36.6 C)  TempSrc: Temporal  SpO2: 96%  Weight: 197 lb 6.4 oz (89.5 kg)  Height: _0  (1.727 m)   Body mass index is 30.01 kg/m.   General: Well developed, well nourished. No acute distress. Ears: Left external ears normal. Left EAC clear. There is a small linear eschar vertically over the lower TM. The TM has a normal light reflex and is   not injected or bulging.  Psych: Alert and oriented. Normal mood and affect.  Health Maintenance Due  Topic Date Due  . FOOT EXAM  07/25/2017     Assessment & Plan:   1. Open wound of left eardrum, initial encounter There appears to be a small bit of trauma to the TM. I suspect there may have been a small perforation with the history of a whistling sound with valsalva. At this point, this appears to be healing well. I recommended against further Valsalva for the next few weeks. I recommended she use an ear wax kit rather than using swabs in the ear canal.  Haydee Salter, MD

## 2021-03-26 NOTE — Telephone Encounter (Signed)
Please see patient message about needing referrals.  Thanks. Dm/cma

## 2021-03-26 NOTE — Patient Instructions (Signed)
Ear Irrigation Ear irrigation is a procedure to wash dirt and wax out of your ear canal. This procedure is also called lavage. You may need ear irrigation if you are having trouble hearing because of a buildup of earwax. You may also have ear irrigation as part of the treatment for an ear infection. Getting wax and dirt out of your ear canal can help ear drops work better. Tell a health care provider about:  Any allergies you have.  All medicines you are taking, including vitamins, herbs, eye drops, creams, and over-the-counter medicines.  Any problems you or family members have had with anesthetic medicines.  Any blood disorders you have.  Any surgeries you have had. This includes any ear surgeries.  Any medical conditions you have.  Whether you are pregnant or may be pregnant. What are the risks? Generally, this is a safe procedure. However, problems may occur, including:  Infection.  Pain.  Hearing loss.  Fluid and debris being pushed through the eardrum and into the middle ear. This can occur if there are holes in the eardrum.  Ear irrigation failing to work. What happens before the procedure?  You will talk with your provider about the procedure and plan.  You may be given ear drops to put in your ear 15-20 minutes before irrigation. This helps loosen the wax. What happens during the procedure?  A syringe is filled with water or saline solution, which is made of salt and water.  The syringe is gently inserted into the ear canal.  The fluid is used to flush out wax and other debris. The procedure may vary among health care providers and hospitals.   What can I expect after the procedure? After an ear irrigation, follow instructions given to you by your health care provider. Follow these instructions at home: Using ear irrigation kits Ear irrigation kits are available for use at home. Ask your health care provider if this is an option for you. In general, you  should:  Use a home irrigation kit only as told by your health care provider.  Read the package instructions carefully.  Follow the directions for using the syringe.  Use water that is room temperature. Do not do ear irrigation at home if you:  Have diabetes. Diabetes increases the risk of infection.  Have a hole or tear in your eardrum.  Have tubes in your ears.  Have had any ear surgery in the past.  Have been told not to irrigate your ears. Cleaning your ears  Clean the outside of your ear with a soft washcloth daily.  If told by your health care provider, use a few drops of baby oil, mineral oil, glycerin, hydrogen peroxide, or over-the-counter earwax softening drops.  Do not use cotton swabs to clean your ears. These can push wax down into the ear canal.  Do not put anything into your ears to try to remove wax. This includes ear candles.   General instructions  Take over-the-counter and prescription medicines only as told by your health care provider.  If you were prescribed an antibiotic medicine, use it as told by your health care provider. Do not stop using the antibiotic even if your condition improves.  Keep the ear clean and dry by following the instructions from your health care provider.  Keep all follow-up visits. This is important.  Visit your health care provider at least once a year to have your ears and hearing checked. Contact a health care provider if:  Your   hearing is not improving or is getting worse.  You have pain or redness in your ear.  You are dizzy.  You have ringing in your ears.  You have nausea or vomiting.  You have fluid, blood, or pus coming out of your ear. Summary  Ear irrigation is a procedure to wash dirt and wax out of your ear canal. This procedure is also called lavage.  To perform ear irrigation, ear drops may be put in your ear 15-20 minutes before irrigation. Water or saline solution will be used to flush out earwax  and other debris.  You may be able to irrigate your ears at home. Ask your health care provider if this is an option for you. Follow your health care provider's instructions.  Clean your ears with a soft cloth after irrigation. Do not use cotton swabs to clean your ears. These can push wax down into the ear canal. This information is not intended to replace advice given to you by your health care provider. Make sure you discuss any questions you have with your health care provider. Document Revised: 04/04/2020 Document Reviewed: 04/04/2020 Elsevier Patient Education  [place].

## 2021-04-02 ENCOUNTER — Other Ambulatory Visit (HOSPITAL_COMMUNITY): Payer: Self-pay

## 2021-04-02 MED FILL — Semaglutide Tab 7 MG: ORAL | 90 days supply | Qty: 90 | Fill #0 | Status: AC

## 2021-04-16 ENCOUNTER — Other Ambulatory Visit (HOSPITAL_COMMUNITY): Payer: Self-pay

## 2021-04-16 MED FILL — Sertraline HCl Tab 50 MG: ORAL | 90 days supply | Qty: 90 | Fill #0 | Status: CN

## 2021-04-24 ENCOUNTER — Other Ambulatory Visit (HOSPITAL_COMMUNITY): Payer: Self-pay

## 2021-04-24 MED FILL — Tamoxifen Citrate Tab 20 MG (Base Equivalent): ORAL | 90 days supply | Qty: 90 | Fill #0 | Status: AC

## 2021-05-04 ENCOUNTER — Ambulatory Visit: Payer: No Typology Code available for payment source

## 2021-05-04 ENCOUNTER — Other Ambulatory Visit: Payer: Self-pay

## 2021-05-04 ENCOUNTER — Ambulatory Visit (INDEPENDENT_AMBULATORY_CARE_PROVIDER_SITE_OTHER): Payer: No Typology Code available for payment source | Admitting: Family Medicine

## 2021-05-04 ENCOUNTER — Encounter: Payer: Self-pay | Admitting: Family Medicine

## 2021-05-04 VITALS — BP 132/76 | HR 80 | Temp 97.0°F | Wt 196.0 lb

## 2021-05-04 DIAGNOSIS — M25561 Pain in right knee: Secondary | ICD-10-CM | POA: Diagnosis not present

## 2021-05-04 DIAGNOSIS — E1165 Type 2 diabetes mellitus with hyperglycemia: Secondary | ICD-10-CM

## 2021-05-04 DIAGNOSIS — E785 Hyperlipidemia, unspecified: Secondary | ICD-10-CM

## 2021-05-04 DIAGNOSIS — S31109A Unspecified open wound of abdominal wall, unspecified quadrant without penetration into peritoneal cavity, initial encounter: Secondary | ICD-10-CM

## 2021-05-04 DIAGNOSIS — Z170 Estrogen receptor positive status [ER+]: Secondary | ICD-10-CM

## 2021-05-04 DIAGNOSIS — G8929 Other chronic pain: Secondary | ICD-10-CM

## 2021-05-04 DIAGNOSIS — C50212 Malignant neoplasm of upper-inner quadrant of left female breast: Secondary | ICD-10-CM

## 2021-05-04 NOTE — Progress Notes (Addendum)
Established Patient Office Visit  Subjective:  Patient ID: Veronica Rogers, female    DOB: [birth year]  Age: 60 y.o. MRN: [id number]  CC:  Chief Complaint  Patient presents with  . Follow-up    6 month follow up / annual physical, knee pain     HPI Veronica Rogers presents for follow-up of elevated triglycerides, chronic pain of right knee and a wound on her abdomen.  She had a skin tag that she scraped off a few weeks ago.  She has not certain that it is healing well.  There has been no discharge from the wound.  No fever.  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  Patient has been to sports medicine for treatment of this.  She is seeing endocrinology for her diabetes care.  Review of the record shows no recent foot exam.  Female care is through GYN  Past Medical History:  Diagnosis Date  . Anxiety   . Breast cancer ([place])   . Contact lens/glasses fitting    wears contacts or glasses  . Depression   . Diabetes ([place]) 11/22/2014  . Hyperlipidemia   . Personal history of chemotherapy   . Personal history of radiation therapy   . Radiation 03/28/14-05/06/14   Left Breast 60 Gy    Past Surgical History:  Procedure Laterality Date  . BREAST BIOPSY  1996   right breast, benign  . BREAST LUMPECTOMY WITH NEEDLE LOCALIZATION AND AXILLARY SENTINEL LYMPH NODE BX Left 10/29/2013   Procedure: BREAST LUMPECTOMY WITH NEEDLE LOCALIZATION AND AXILLARY SENTINEL LYMPH NODE BX;  Surgeon: Rolm Bookbinder, MD;  Location: [facility];  Service: General;  Laterality: Left;  . COLONOSCOPY  05/2011   WNL  . WISDOM TOOTH EXTRACTION      Family History  Problem Relation Age of Onset  . Squamous cell carcinoma Father   . Cancer Father   . Diabetes Father   . Hypertension Mother   . Diabetes Sister     Social History   Socioeconomic History  . Marital status:  Married    Spouse name: Not on file  . Number of children: Not on file  . Years of education: Not on file  . Highest education level: Not on file  Occupational History  . Not on file  Tobacco Use  . Smoking status: Never Smoker  . Smokeless tobacco: Never Used  Substance and Sexual Activity  . Alcohol use: Yes    Comment: 1-2 drinks a month  . Drug use: No  . Sexual activity: Yes  Other Topics Concern  . Not on file  Social History Narrative  . Not on file   Social Determinants of Health   Financial Resource Strain: Not on file  Food Insecurity: Not on file  Transportation Needs: Not on file  Physical Activity: Not on file  Stress: Not on file  Social Connections: Not on file  Intimate Partner Violence: Not on file    Outpatient Medications Prior to Visit  Medication Sig Dispense Refill  . albuterol (VENTOLIN HFA) 108 (90 Base) MCG/ACT inhaler INHALE 1-2 PUFFS INTO THE LUNGS EVERY 6 (SIX) HOURS AS NEEDED FOR WHEEZING OR SHORTNESS OF BREATH. 18 g 1  . fenofibrate (TRICOR) 48 MG tablet TAKE 1 TABLET (48 MG TOTAL) BY MOUTH DAILY. 90 tablet 3  . glipiZIDE (GLUCOTROL) 5 MG tablet TAKE 1 TO 2  TABLETS BY MOUTH DAILY WITH EVENING MEAL 180 tablet 0  . glucose blood (FREESTYLE TEST STRIPS) test strip UAD for bid blood glucose testing Dx: E11.65 100 each 12  . glucose monitoring kit (FREESTYLE) monitoring kit 1 each by Does not apply route as needed for other. UAD for bid blood glucose testing Dx: E11.65 1 each 1  . guaiFENesin-codeine (ROBITUSSIN AC) 100-10 MG/5ML syrup TAKE 5 MLS BY MOUTH EVERY 6 (SIX) HOURS AS NEEDED FOR COUGH. 120 mL 0  . hydrocortisone-pramoxine (ANALPRAM-HC) 2.5-1 % rectal cream APPLY TO THE AFFECTED AREA(S) UP TO 2 TIMES DAILY 30 g 3  . Lancets (FREESTYLE) lancets UAD for bid blood glucose testing Dx: E11.65 100 each 12  . meloxicam (MOBIC) 7.5 MG tablet TAKE 1 TABLET BY MOUTH ONCE DAILY AS NEEDED WITH FOOD. 30 tablet 2  . metFORMIN (GLUCOPHAGE-XR) 750 MG 24 hr  tablet TAKE 2 TABLETS BY MOUTH DAILY AT BEDTIME 180 tablet 3  . oxybutynin (DITROPAN-XL) 10 MG 24 hr tablet Take 10 mg by mouth every morning.    Marland Kitchen oxybutynin (DITROPAN-XL) 10 MG 24 hr tablet TAKE 1 TABLET EVERY DAY BY MOUTH IN THE MORNING 90 tablet 3  . Pramoxine-HC (HYDROCORTISONE ACE-PRAMOXINE) 2.5-1 % CREA hydrocortisone-pramoxine 2.5 %-1 % rectal cream  APPLY TO THE AFFECTED AREA(S) UP TO 2 TIMES DAILY    . sertraline (ZOLOFT) 50 MG tablet Take 50 mg by mouth every morning.     . tamoxifen (NOLVADEX) 20 MG tablet TAKE 1 TABLET BY MOUTH DAILY. 90 tablet 3  . COVID-19 mRNA vaccine, Pfizer, 30 MCG/0.3ML injection INJECT AS DIRECTED (Patient not taking: Reported on 05/04/2021) .3 mL 0  . Semaglutide 7 MG TABS TAKE 1 TABLET BY MOUTH ONCE DAILY BEFORE BREAKFAST. 90 tablet 3  . sertraline (ZOLOFT) 50 MG tablet TAKE 1 TABLET BY MOUTH ONCE DAILY (Patient not taking: Reported on 05/04/2021) 90 tablet 3  . sertraline (ZOLOFT) 50 MG tablet TAKE 1 TABLET BY MOUTH ONCE DAILY (Patient not taking: Reported on 05/04/2021) 90 tablet 3   No facility-administered medications prior to visit.    Allergies  Allergen Reactions  . Erythromycin Other (See Comments)    Stomach pain  . B-Complex-B-12 [B Complex Vitamins] Itching and Rash    Itchy skin  . Tape Rash    ROS Review of Systems  Constitutional: Negative.   HENT: Negative.   Respiratory: Negative.   Cardiovascular: Negative.   Gastrointestinal: Negative.   Endocrine: Negative for polyphagia and polyuria.  Genitourinary: Negative.   Musculoskeletal: Positive for arthralgias and gait problem.  Skin: Positive for wound.  Neurological: Negative for speech difficulty and weakness.  Psychiatric/Behavioral: Negative.       Objective:    Physical Exam Vitals and nursing note reviewed.  Constitutional:      General: She is not in acute distress.    Appearance: Normal appearance. She is not ill-appearing, toxic-appearing or diaphoretic.  HENT:      Head: Normocephalic and atraumatic.     Right Ear: Tympanic membrane, ear canal and external ear normal.     Left Ear: Tympanic membrane, ear canal and external ear normal.     Mouth/Throat:     Mouth: Mucous membranes are moist.     Pharynx: Oropharynx is clear. No oropharyngeal exudate or posterior oropharyngeal erythema.  Eyes:     General: No scleral icterus.       Right eye: No discharge.        Left eye: No discharge.  Extraocular Movements: Extraocular movements intact.     Conjunctiva/sclera: Conjunctivae normal.     Pupils: Pupils are equal, round, and reactive to light.  Neck:     Vascular: No carotid bruit.  Cardiovascular:     Rate and Rhythm: Normal rate and regular rhythm.     Pulses:          Carotid pulses are 2+ on the right side and 2+ on the left side.      Dorsalis pedis pulses are 2+ on the right side and 2+ on the left side.       Posterior tibial pulses are 1+ on the right side and 1+ on the left side.  Pulmonary:     Effort: Pulmonary effort is normal.     Breath sounds: Normal breath sounds.  Abdominal:     General: Bowel sounds are normal.  Musculoskeletal:     Cervical back: No rigidity or tenderness.  Lymphadenopathy:     Cervical: No cervical adenopathy.  Skin:    General: Skin is warm and dry.       Neurological:     Mental Status: She is alert and oriented to person, place, and time.  Psychiatric:        Mood and Affect: Mood normal.        Behavior: Behavior normal.    Diabetic Foot Exam - Simple   Simple Foot Form Visual Inspection No deformities, no ulcerations, no other skin breakdown bilaterally: Yes Sensation Testing Intact to touch and monofilament testing bilaterally: Yes Pulse Check Posterior Tibialis and Dorsalis pulse intact bilaterally: Yes Comments    BP 132/76 (BP Location: Right Arm, Patient Position: Sitting, Cuff Size: Large)   Pulse 80   Temp (!) 97 F (36.1 C) (Oral)   Wt 196 lb (88.9 kg)   LMP 11/08/2013    SpO2 95%   BMI 29.80 kg/m  Wt Readings from Last 3 Encounters:  05/08/21 194 lb (88 kg)  05/04/21 196 lb (88.9 kg)  03/26/21 197 lb 6.4 oz (89.5 kg)     Health Maintenance Due  Topic Date Due  . Zoster Vaccines- Shingrix (1 of 2) Never done  . FOOT EXAM  07/25/2017  . COVID-19 Vaccine (4 - Booster for Pfizer series) 03/08/2021  . COLONOSCOPY (Pts 45-46yr Insurance coverage will need to be confirmed)  05/30/2021    There are no preventive care reminders to display for this patient.  Lab Results  Component Value Date   TSH 1.86 09/13/2019   Lab Results  Component Value Date   WBC 6.3 05/08/2021   HGB 12.0 05/08/2021   HCT 37.0 05/08/2021   MCV 81.8 05/08/2021   PLT 202.0 05/08/2021   Lab Results  Component Value Date   NA 140 05/08/2021   K 4.2 05/08/2021   CHLORIDE 103 01/17/2015   CO2 29 05/08/2021   GLUCOSE 135 (H) 05/08/2021   BUN 23 05/08/2021   CREATININE 0.77 05/08/2021   BILITOT 0.4 05/08/2021   ALKPHOS 36 (L) 05/08/2021   AST 19 05/08/2021   ALT 15 05/08/2021   PROT 6.6 05/08/2021   ALBUMIN 4.2 05/08/2021   CALCIUM 9.6 05/08/2021   ANIONGAP 10 01/17/2015   EGFR >90 01/17/2015   GFR 83.72 05/08/2021   Lab Results  Component Value Date   CHOL 181 05/08/2021   Lab Results  Component Value Date   HDL 40.00 05/08/2021   Lab Results  Component Value Date   LDLCALC 103 (H) 05/08/2021   Lab  Results  Component Value Date   TRIG 189.0 (H) 05/08/2021   Lab Results  Component Value Date   CHOLHDL 5 05/08/2021   Lab Results  Component Value Date   HGBA1C 6.9 (A) 05/08/2021      Assessment & Plan:   Problem List Items Addressed This Visit      Endocrine   Type 2 diabetes mellitus with hyperglycemia, without long-term current use of insulin ([place])   Relevant Orders   CBC   Comprehensive metabolic panel   Microalbumin / creatinine urine ratio   Urinalysis, Routine w reflex microscopic   Ambulatory referral to Endocrinology     Other    Breast cancer of upper-inner quadrant of left female breast [facility])   Relevant Orders   Ambulatory referral to Hematology / Oncology   Hyperlipidemia   Relevant Orders   Comprehensive metabolic panel   Lipid panel   Chronic pain of right knee - Primary   Relevant Orders   Ambulatory referral to Orthopedic Surgery   DG Knee Complete 4 Views Right   Wound of abdomen      No orders of the defined types were placed in this encounter.   Follow-up: Return in about 6 months (around 11/04/2021).  Continue follow-up with endocrinology.  Follow-up fasting for above ordered blood work.  Agrees to go for orthopedic consultation for her chronically painful right knee.  Will return if wound on the end of abdomen does not continue to heal.  For future skin lesions of concern and I asked her to be seen at the clinic and have them removed here if need be.  Libby Maw, MD

## 2021-05-07 ENCOUNTER — Encounter: Payer: Self-pay | Admitting: Internal Medicine

## 2021-05-08 ENCOUNTER — Other Ambulatory Visit: Payer: Self-pay

## 2021-05-08 ENCOUNTER — Other Ambulatory Visit: Payer: No Typology Code available for payment source

## 2021-05-08 ENCOUNTER — Encounter: Payer: Self-pay | Admitting: Internal Medicine

## 2021-05-08 ENCOUNTER — Ambulatory Visit: Payer: No Typology Code available for payment source | Admitting: Internal Medicine

## 2021-05-08 VITALS — BP 130/88 | HR 81 | Ht 68.0 in | Wt 194.0 lb

## 2021-05-08 DIAGNOSIS — E1165 Type 2 diabetes mellitus with hyperglycemia: Secondary | ICD-10-CM | POA: Diagnosis not present

## 2021-05-08 DIAGNOSIS — E785 Hyperlipidemia, unspecified: Secondary | ICD-10-CM

## 2021-05-08 DIAGNOSIS — E663 Overweight: Secondary | ICD-10-CM

## 2021-05-08 LAB — URINALYSIS WITH CULTURE, IF INDICATED
Bilirubin Urine: NEGATIVE
Hgb urine dipstick: NEGATIVE
Ketones, ur: NEGATIVE
Leukocytes,Ua: NEGATIVE
Nitrite: NEGATIVE
RBC / HPF: NONE SEEN (ref 0–?)
Specific Gravity, Urine: >=1.03 — AB (ref 1.000–1.030)
Total Protein, Urine: NEGATIVE
Urine Glucose: NEGATIVE
Urobilinogen, UA: 0.2 (ref 0.0–1.0)
pH: 5.5 (ref 5.0–8.0)

## 2021-05-08 LAB — CBC
HCT: 37 % (ref 36.0–46.0)
Hemoglobin: 12 g/dL (ref 12.0–15.0)
MCHC: 32.5 g/dL (ref 30.0–36.0)
MCV: 81.8 fl (ref 78.0–100.0)
Platelets: 202 10*3/uL (ref 150.0–400.0)
RBC: 4.52 Mil/uL (ref 3.87–5.11)
RDW: 13.9 % (ref 11.5–15.5)
WBC: 6.3 10*3/uL (ref 4.0–10.5)

## 2021-05-08 LAB — POCT GLYCOSYLATED HEMOGLOBIN (HGB A1C): Hemoglobin A1C: 6.9 % — AB (ref 4.0–5.6)

## 2021-05-08 LAB — COMPREHENSIVE METABOLIC PANEL
ALT: 15 U/L (ref 0–35)
AST: 19 U/L (ref 0–37)
Albumin: 4.2 g/dL (ref 3.5–5.2)
Alkaline Phosphatase: 36 U/L — ABNORMAL LOW (ref 39–117)
BUN: 23 mg/dL (ref 6–23)
CO2: 29 mEq/L (ref 19–32)
Calcium: 9.6 mg/dL (ref 8.4–10.5)
Chloride: 103 mEq/L (ref 96–112)
Creatinine, Ser: 0.77 mg/dL (ref 0.40–1.20)
GFR: 83.72 mL/min (ref >60.00)
Glucose, Bld: 135 mg/dL — ABNORMAL HIGH (ref 70–99)
Potassium: 4.2 mEq/L (ref 3.5–5.1)
Sodium: 140 mEq/L (ref 135–145)
Total Bilirubin: 0.4 mg/dL (ref 0.2–1.2)
Total Protein: 6.6 g/dL (ref 6.0–8.3)

## 2021-05-08 LAB — MICROALBUMIN / CREATININE URINE RATIO
Creatinine,U: 96.4 mg/dL
Microalb Creat Ratio: 0.7 mg/g (ref 0.0–30.0)
Microalb, Ur: <0.7 mg/dL (ref 0.0–1.9)

## 2021-05-08 LAB — LIPID PANEL
Cholesterol: 181 mg/dL (ref 0–200)
HDL: 40 mg/dL (ref >39.00)
LDL Cholesterol: 103 mg/dL — ABNORMAL HIGH (ref 0–99)
NonHDL: 141.17
Total CHOL/HDL Ratio: 5
Triglycerides: 189 mg/dL — ABNORMAL HIGH (ref 0.0–149.0)
VLDL: 37.8 mg/dL (ref 0.0–40.0)

## 2021-05-08 NOTE — Patient Instructions (Addendum)
PPlease continue:  - Metformin ER 1500 mg with dinner - Glipizide 5 mg before dinner  Try to increase: - Rybelsus 14 mg before b'fast.  STOP dry fruit.  Try to stop eating snacks.  Please return in 4 months with your sugar log.

## 2021-05-08 NOTE — Progress Notes (Signed)
Patient ID: Veronica Rogers, female   DOB: 1961/06/14, 60 y.o.   MRN: [id number]   This visit occurred during the SARS-CoV-2 public health emergency.  Safety protocols were in place, including screening questions prior to the visit, additional usage of staff PPE, and extensive cleaning of exam room while observing appropriate contact time as indicated for disinfecting solutions.   HPI: Veronica Rogers is a 60 y.o.-year-old female, presenting presenting for f/u for DM2, dx in 2015 (after steroids), non-insulin-dependent, uncontrolled, without long term complications. Last visit 6.5 months ago.  Interim history: No increased urination, blurry vision, but did have some nausea after increasing the Rybelsus and now only occasionally.  Reviewed HbA1c levels: Lab Results  Component Value Date   HGBA1C 7.2 (H) 10/13/2020   HGBA1C 7.9 (H) 09/13/2019   HGBA1C 6.7 (A) 01/08/2019   Pt is on a regimen of:  - Metformin ER 1500 mg with dinner - Januvia 100 mg before breakfast >> Rybelsus 7 mg before breakfast - Glipizide 5 mg before dinner She was on insulin during ChTx. She was on regular metformin >> GI upset (diarrhea). She has frequent urination.  Pt checks her sugars 1-3 times a day: - am:  127-137 >> 123-134 >> 130s >> 119-140, 161 >> 128-166 (forgot metformin) - 2h after b'fast:95-101 >> 120 >> n/c - before lunch: 98-169 >> n/c >> 89 >> 109-114 >> n/c - 2h after lunch:n/c >> 140-152 >> n/c >> 140-149 >> n/c - before dinner: 134, 135 >> n/c >> 122, 140, 186 >> 150 - 2h after dinner: 152 >> n/c >> 160s >> n/c >> 189 >> n/c - bedtime: n/c   - nighttime: 85, 89, 98, 120 >> 114 >> n/c Lowest sugar was 93 >> 106 >> 85 >> 109 >> 128;  she has hypoglycemia awareness in the 70s. Highest sugar was  229 (large sandwich) >> 160s >> 189 >> 166.  Glucometer: True test >> AccuChek guide >> Freestyle Lite  Pt's meals are: - Breakfast: cereals or 2 eggs or skips >> cereals - 10:30 am: PB crackers -  Lunch: leftovers from home: sandwich; soup; meat + veggies; chinese; salad - Dinner: eats out most dinners! During the coronavirus pandemic, she was using her bicycle and walking more.  -No HL, last BUN/creatinine:  Lab Results  Component Value Date   BUN 11 10/13/2020   CREATININE 0.83 10/13/2020   -+HL; last set of lipids: Lab Results  Component Value Date   CHOL 181 10/13/2020   HDL 73.80 10/13/2020   LDLCALC 93 10/13/2020   LDLDIRECT 116.0 09/13/2019   TRIG 72.0 10/13/2020   CHOLHDL 2 10/13/2020  Not on a statin.  Continues on fenofibrate 48. - last eye exam was on 10/03/2020: No DR. Dr. Katy Fitch. -She denies numbness and tingling in her feet.  She has a history of plantar fasciitis.  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 On Tamoxifen for 5 years.  ROS: Constitutional: no weight gain/no weight loss, no fatigue, no subjective hyperthermia, no subjective hypothermia Eyes: no blurry vision, no xerophthalmia ENT: no sore throat, no nodules palpated in neck, no dysphagia, no odynophagia, no hoarseness Cardiovascular: no CP/no SOB/no palpitations/no leg swelling Respiratory: no cough/no SOB/no wheezing Gastrointestinal: + N/no V/no D/no C/+ acid reflux Musculoskeletal: no muscle aches/no joint aches Skin: no rashes, no hair loss Neurological: no tremors/no numbness/no tingling/no dizziness  I reviewed pt's medications, allergies, PMH, social hx, family hx, and changes were documented in the history of present illness. Otherwise, unchanged from my initial  visit note.  Past Medical History:  Diagnosis Date  . Anxiety   . Breast cancer ([place])   . Contact lens/glasses fitting    wears contacts or glasses  . Depression   . Diabetes ([place]) 11/22/2014  . Hyperlipidemia   . Personal history of chemotherapy   . Personal history of radiation therapy   . Radiation 03/28/14-05/06/14   Left Breast 60 Gy   Past Surgical History:  Procedure Laterality Date  . BREAST BIOPSY  1996   right breast, benign  .  BREAST LUMPECTOMY WITH NEEDLE LOCALIZATION AND AXILLARY SENTINEL LYMPH NODE BX Left 10/29/2013   Procedure: BREAST LUMPECTOMY WITH NEEDLE LOCALIZATION AND AXILLARY SENTINEL LYMPH NODE BX;  Surgeon: Rolm Bookbinder, MD;  Location: [facility];  Service: General;  Laterality: Left;  . COLONOSCOPY  05/2011   WNL  . WISDOM TOOTH EXTRACTION     Social History   Social History  . Marital Status: Married    Spouse Name: N/A  . Number of Children: 2   Occupational History  . [employer] - support rep   Social History Main Topics  . Smoking status: Never Smoker   . Smokeless tobacco: Never Used  . Alcohol Use: Yes     Comment: 1-2 drinks a month  . Drug Use: No   Current Outpatient Medications on File Prior to Visit  Medication Sig Dispense Refill  . albuterol (VENTOLIN HFA) 108 (90 Base) MCG/ACT inhaler INHALE 1-2 PUFFS INTO THE LUNGS EVERY 6 (SIX) HOURS AS NEEDED FOR WHEEZING OR SHORTNESS OF BREATH. 18 g 1  . COVID-19 mRNA vaccine, Pfizer, 30 MCG/0.3ML injection INJECT AS DIRECTED (Patient not taking: Reported on 05/04/2021) .3 mL 0  . fenofibrate (TRICOR) 48 MG tablet TAKE 1 TABLET (48 MG TOTAL) BY MOUTH DAILY. 90 tablet 3  . glipiZIDE (GLUCOTROL) 5 MG tablet TAKE 1 TO 2 TABLETS BY MOUTH DAILY WITH EVENING MEAL 180 tablet 0  . glucose blood (FREESTYLE TEST STRIPS) test strip UAD for bid blood glucose testing Dx: E11.65 100 each 12  . glucose monitoring kit (FREESTYLE) monitoring kit 1 each by Does not apply route as needed for other. UAD for bid blood glucose testing Dx: E11.65 1 each 1  . guaiFENesin-codeine (ROBITUSSIN AC) 100-10 MG/5ML syrup TAKE 5 MLS BY MOUTH EVERY 6 (SIX) HOURS AS NEEDED FOR COUGH. 120 mL 0  . hydrocortisone-pramoxine (ANALPRAM-HC) 2.5-1 % rectal cream APPLY TO THE AFFECTED AREA(S) UP TO 2 TIMES DAILY 30 g 3  . Lancets (FREESTYLE) lancets UAD for bid blood glucose testing Dx: E11.65 100 each 12  . meloxicam (MOBIC) 7.5 MG tablet TAKE 1  TABLET BY MOUTH ONCE DAILY AS NEEDED WITH FOOD. 30 tablet 2  . metFORMIN (GLUCOPHAGE-XR) 750 MG 24 hr tablet TAKE 2 TABLETS BY MOUTH DAILY AT BEDTIME 180 tablet 3  . oxybutynin (DITROPAN-XL) 10 MG 24 hr tablet Take 10 mg by mouth every morning.    Marland Kitchen oxybutynin (DITROPAN-XL) 10 MG 24 hr tablet TAKE 1 TABLET EVERY DAY BY MOUTH IN THE MORNING 90 tablet 3  . Pramoxine-HC (HYDROCORTISONE ACE-PRAMOXINE) 2.5-1 % CREA hydrocortisone-pramoxine 2.5 %-1 % rectal cream  APPLY TO THE AFFECTED AREA(S) UP TO 2 TIMES DAILY    . Semaglutide 7 MG TABS TAKE 1 TABLET BY MOUTH ONCE DAILY BEFORE BREAKFAST. 90 tablet 3  . sertraline (ZOLOFT) 50 MG tablet Take 50 mg by mouth every morning.     . sertraline (ZOLOFT) 50 MG tablet TAKE 1 TABLET  BY MOUTH ONCE DAILY (Patient not taking: Reported on 05/04/2021) 90 tablet 3  . sertraline (ZOLOFT) 50 MG tablet TAKE 1 TABLET BY MOUTH ONCE DAILY (Patient not taking: Reported on 05/04/2021) 90 tablet 3  . tamoxifen (NOLVADEX) 20 MG tablet TAKE 1 TABLET BY MOUTH DAILY. 90 tablet 3   No current facility-administered medications on file prior to visit.   Allergies  Allergen Reactions  . Erythromycin Other (See Comments)    Stomach pain  . B-Complex-B-12 [B Complex Vitamins] Itching and Rash    Itchy skin  . Tape Rash   Family History  Problem Relation Age of Onset  . Squamous cell carcinoma Father   . Cancer Father   . Diabetes Father   . Hypertension Mother   . Diabetes Sister    PE: BP 130/88 (BP Location: Right Arm, Patient Position: Sitting, Cuff Size: Normal)   Pulse 81   Ht $R'5\' 8"'TS$  (1.727 m)   Wt 194 lb (88 kg)   LMP 11/08/2013   SpO2 97%   BMI 29.50 kg/m  Body mass index is 29.5 kg/m. Wt Readings from Last 3 Encounters:  05/08/21 194 lb (88 kg)  05/04/21 196 lb (88.9 kg)  03/26/21 197 lb 6.4 oz (89.5 kg)   Constitutional: overweight, in NAD Eyes: PERRLA, EOMI, no exophthalmos ENT: moist mucous membranes, no thyromegaly, no cervical  lymphadenopathy Cardiovascular: RRR, No MRG Respiratory: CTA B Gastrointestinal: abdomen soft, NT, ND, BS+ Musculoskeletal: no deformities, strength intact in all 4 Skin: moist, warm, no rashes Neurological: no tremor with outstretched hands, DTR normal in all 4  ASSESSMENT: 1. DM2, non-insulin-dependent, uncontrolled, without long term complications, but with hyperglycemia  No FH of MTC or personal hx of pancreatitis.  2. HL  3.  Overweight  PLAN:  1. Patient with longstanding, uncontrolled, type 2 diabetes, on oral medication with metformin, sulfonylurea, and p.o. GLP-1 receptor agonist, added at last visit.  She was previously on DPP 4 inhibitor.  She tolerates Rybelsus well. -Latest HbA1c reviewed from last visit: 7.2%, above target, but improved -At this visit, sugars are above target, but she only has 4 blood sugar checks in the last 2 weeks -per meter download.  We discussed about CBG targets and advised him to continue the metformin ER and the glipizide but to try to increase Rybelsus to 14 mg before breakfast.  I did advise her to back off to 7 mg again if she has more nausea or reflux -She also tells me that she eats dried fruit for snacks.  I advised her to stop these and ideally to also stop snacks.  We discussed about healthier breakfast foods.  Now she eats cereals. - I suggested to:  Patient Instructions  PPlease continue:  - Metformin ER 1500 mg with dinner - Glipizide 5 mg before dinner  Try to increase: - Rybelsus 14 mg before b'fast.  STOP dry fruit.  Try to stop eating snacks.  Please return in 4 months with your sugar log.  - we checked her HbA1c: 6.9% (lower than expected from log) - advised to check sugars at different times of the day - 1x a day, rotating check times - advised for yearly eye exams >> she is UTD - will check labs ordered by PCP -she is not fasting and prefers to have them checked here today - return to clinic in 4 months   2.  HL -Reviewed latest lipid panel from 09/2020: Fractions at goal: Lab Results  Component Value Date  CHOL 181 10/13/2020   HDL 73.80 10/13/2020   LDLCALC 93 10/13/2020   LDLDIRECT 116.0 09/13/2019   TRIG 72.0 10/13/2020   CHOLHDL 2 10/13/2020  -Triglycerides are higher than 700s in the past -She continues on fenofibrate 48 mg daily, but she was inconsistent taking it.  At last visit, we discussed about taking it consistently.  3.  Overweight -Continues on Rybelsus, which should help with weight loss -Weight approximately stable since last visit  Office Visit on 05/08/2021  Component Date Value Ref Range Status  . Microalb, Ur 05/08/2021 <0.7  0.0 - 1.9 mg/dL Final  . Creatinine,U 05/08/2021 96.4  mg/dL Final  . Microalb Creat Ratio 05/08/2021 0.7  0.0 - 30.0 mg/g Final  . Sodium 05/08/2021 140  135 - 145 mEq/L Final  . Potassium 05/08/2021 4.2  3.5 - 5.1 mEq/L Final  . Chloride 05/08/2021 103  96 - 112 mEq/L Final  . CO2 05/08/2021 29  19 - 32 mEq/L Final  . Glucose, Bld 05/08/2021 135* 70 - 99 mg/dL Final  . BUN 05/08/2021 23  6 - 23 mg/dL Final  . Creatinine, Ser 05/08/2021 0.77  0.40 - 1.20 mg/dL Final  . Total Bilirubin 05/08/2021 0.4  0.2 - 1.2 mg/dL Final  . Alkaline Phosphatase 05/08/2021 36* 39 - 117 U/L Final  . AST 05/08/2021 19  0 - 37 U/L Final  . ALT 05/08/2021 15  0 - 35 U/L Final  . Total Protein 05/08/2021 6.6  6.0 - 8.3 g/dL Final  . Albumin 05/08/2021 4.2  3.5 - 5.2 g/dL Final  . GFR 05/08/2021 83.72  >60.00 mL/min Final   Calculated using the CKD-EPI Creatinine Equation (2021)  . Calcium 05/08/2021 9.6  8.4 - 10.5 mg/dL Final  . Cholesterol 05/08/2021 181  0 - 200 mg/dL Final   ATP III Classification       Desirable:  < 200 mg/dL               Borderline High:  200 - 239 mg/dL          High:  > = 240 mg/dL  . Triglycerides 05/08/2021 189.0* 0.0 - 149.0 mg/dL Final   Normal:  <150 mg/dLBorderline High:  150 - 199 mg/dL  . HDL 05/08/2021 40.00  >39.00 mg/dL  Final  . VLDL 05/08/2021 37.8  0.0 - 40.0 mg/dL Final  . LDL Cholesterol 05/08/2021 103* 0 - 99 mg/dL Final  . Total CHOL/HDL Ratio 05/08/2021 5   Final                  Men          Women1/2 Average Risk     3.4          3.3Average Risk          5.0          4.42X Average Risk          9.6          7.13X Average Risk          15.0          11.0                      . NonHDL 05/08/2021 141.17   Final   NOTE:  Non-HDL goal should be 30 mg/dL higher than patient's LDL goal (i.e. LDL goal of < 70 mg/dL, would have non-HDL goal of < 100 mg/dL)  . Color, Urine 05/08/2021 YELLOW  Yellow;Lt. Yellow;Straw;Dark Yellow;Amber;Green;Red;Brown Final  . APPearance 05/08/2021 CLEAR  Clear;Turbid;Slightly Cloudy;Cloudy Final  . Specific Gravity, Urine 05/08/2021 >=1.030* 1.000 - 1.030 Final  . pH 05/08/2021 5.5  5.0 - 8.0 Final  . Total Protein, Urine 05/08/2021 NEGATIVE  Negative Final  . Urine Glucose 05/08/2021 NEGATIVE  Negative Final  . Ketones, ur 05/08/2021 NEGATIVE  Negative Final  . Bilirubin Urine 05/08/2021 NEGATIVE  Negative Final  . Hgb urine dipstick 05/08/2021 NEGATIVE  Negative Final  . Urobilinogen, UA 05/08/2021 0.2  0.0 - 1.0 Final  . Leukocytes,Ua 05/08/2021 NEGATIVE  Negative Final  . Nitrite 05/08/2021 NEGATIVE  Negative Final  . WBC, UA 05/08/2021 0-2/hpf  0-2/hpf Final  . RBC / HPF 05/08/2021 none seen  0-2/hpf Final  . Squamous Epithelial / LPF 05/08/2021 Rare(0-4/hpf)  Rare(0-4/hpf) Final  . Bacteria, UA 05/08/2021 Rare(<10/hpf)* None Final  . Yeast, UA 05/08/2021 Presence of* None Final  . WBC 05/08/2021 6.3  4.0 - 10.5 K/uL Final  . RBC 05/08/2021 4.52  3.87 - 5.11 Mil/uL Final  . Platelets 05/08/2021 202.0  150.0 - 400.0 K/uL Final  . Hemoglobin 05/08/2021 12.0  12.0 - 15.0 g/dL Final  . HCT 05/08/2021 37.0  36.0 - 46.0 % Final  . MCV 05/08/2021 81.8  78.0 - 100.0 fl Final  . MCHC 05/08/2021 32.5  30.0 - 36.0 g/dL Final  . RDW 05/08/2021 13.9  11.5 - 15.5 % Final  .  Hemoglobin A1C 05/08/2021 6.9* 4.0 - 5.6 % Final   CBC normal.  Urinalysis continues to show yeast. LDL slightly high, ACR at goal, GFR normal, TG elevated, LDL slightly higher than before. Improving diet by cutting out snacks and also improving diabetes by increasing Rybelsus should help with cholesterol levels, also.  Will also check with her if she agrees to start a statin. I will send results to PCP.  Philemon Kingdom, MD PhD [facility] Endocrinology

## 2021-05-09 ENCOUNTER — Encounter: Payer: Self-pay | Admitting: Internal Medicine

## 2021-05-09 LAB — URINE CULTURE
MICRO NUMBER:: 11871332
SPECIMEN QUALITY:: ADEQUATE

## 2021-05-10 ENCOUNTER — Other Ambulatory Visit (HOSPITAL_COMMUNITY): Payer: Self-pay

## 2021-05-10 MED FILL — Fenofibrate Tab 48 MG: ORAL | 90 days supply | Qty: 90 | Fill #0 | Status: AC

## 2021-05-10 MED FILL — Metformin HCl Tab ER 24HR 750 MG: ORAL | 90 days supply | Qty: 180 | Fill #0 | Status: AC

## 2021-05-30 ENCOUNTER — Encounter: Payer: Self-pay | Admitting: Family Medicine

## 2021-05-30 NOTE — Addendum Note (Signed)
Addended by: Jon Billings on: 05/30/2021 10:38 AM   Modules accepted: Orders

## 2021-05-31 ENCOUNTER — Other Ambulatory Visit (HOSPITAL_COMMUNITY): Payer: Self-pay

## 2021-06-05 ENCOUNTER — Other Ambulatory Visit (HOSPITAL_COMMUNITY): Payer: Self-pay

## 2021-06-05 MED FILL — Oxybutynin Chloride Tab ER 24HR 10 MG: ORAL | 90 days supply | Qty: 90 | Fill #0 | Status: AC

## 2021-06-05 MED FILL — Oxybutynin Chloride Tab ER 24HR 10 MG: ORAL | 90 days supply | Qty: 90 | Fill #0 | Status: CN

## 2021-06-12 ENCOUNTER — Other Ambulatory Visit: Payer: Self-pay

## 2021-06-12 ENCOUNTER — Ambulatory Visit
Admission: RE | Admit: 2021-06-12 | Discharge: 2021-06-12 | Disposition: A | Discharge status: Home / Self Care | Payer: No Typology Code available for payment source | Source: Ambulatory Visit | Attending: Family Medicine | Admitting: Family Medicine

## 2021-06-12 DIAGNOSIS — Z0000 Encounter for general adult medical examination without abnormal findings: Secondary | ICD-10-CM

## 2021-06-14 ENCOUNTER — Ambulatory Visit: Payer: No Typology Code available for payment source

## 2021-06-27 ENCOUNTER — Encounter: Payer: Self-pay | Admitting: Gastroenterology

## 2021-06-28 ENCOUNTER — Encounter: Payer: Self-pay | Admitting: Family Medicine

## 2021-06-28 ENCOUNTER — Other Ambulatory Visit (HOSPITAL_COMMUNITY): Payer: Self-pay

## 2021-06-28 ENCOUNTER — Other Ambulatory Visit: Payer: Self-pay

## 2021-06-28 ENCOUNTER — Telehealth (INDEPENDENT_AMBULATORY_CARE_PROVIDER_SITE_OTHER): Payer: No Typology Code available for payment source | Admitting: Family Medicine

## 2021-06-28 VITALS — BP 130/88 | Ht 68.0 in | Wt 194.0 lb

## 2021-06-28 DIAGNOSIS — M62830 Muscle spasm of back: Secondary | ICD-10-CM | POA: Diagnosis not present

## 2021-06-28 MED ORDER — IBUPROFEN 800 MG PO TABS
800.0000 mg | ORAL_TABLET | Freq: Three times a day (TID) | ORAL | 0 refills | Status: DC | PRN
  Filled 2021-06-28: qty 30, 10d supply, fill #0

## 2021-06-28 MED ORDER — CYCLOBENZAPRINE HCL 5 MG PO TABS
5.0000 mg | ORAL_TABLET | Freq: Two times a day (BID) | ORAL | 0 refills | Status: DC | PRN
  Filled 2021-06-28: qty 30, 15d supply, fill #0

## 2021-06-28 MED FILL — Semaglutide Tab 7 MG: ORAL | 90 days supply | Qty: 90 | Fill #1 | Status: AC

## 2021-06-28 NOTE — Progress Notes (Signed)
Virtual Visit via Video Note  I connected with Veronica Rogers on 06/28/21 at  1:00 PM EDT by a video enabled telemedicine application and verified that I am speaking with the correct person using two identifiers. Location patient: home Location provider: work  Persons participating in the virtual visit: patient, provider  I discussed the limitations of evaluation and management by telemedicine and the availability of in person appointments. The patient expressed understanding and agreed to proceed.  Chief Complaint  Patient presents with   Back Pain    Lower right side back spasms since last night.  Pulled area on 06/27/2021 moving objects.     HPI: Veronica Rogers is a 60 y.o. female patient of Dr. Ethelene Hal who complains of Rt back spasm/pain that began yesterday after she was lifting groceries out of car into house. She states she thinks she "twisted funny". This is not a new issue and seems to happen at least once per day.  She is using heating pad and she took $Remove'300mg'PiZmAXX$  gabapentin (from a neighbor) last night and this AM.  She has Rx for meloxicam but does not take regularly and has not taken it for this or recently.   Past Medical History:  Diagnosis Date   Anxiety    Breast cancer ([place])    Contact lens/glasses fitting    wears contacts or glasses   Depression    Diabetes ([place]) 11/22/2014   Hyperlipidemia    Personal history of chemotherapy    Personal history of radiation therapy    Radiation 03/28/14-05/06/14   Left Breast 60 Gy    Past Surgical History:  Procedure Laterality Date   BREAST BIOPSY  1996   right breast, benign   BREAST LUMPECTOMY WITH NEEDLE LOCALIZATION AND AXILLARY SENTINEL LYMPH NODE BX Left 10/29/2013   Procedure: BREAST LUMPECTOMY WITH NEEDLE LOCALIZATION AND AXILLARY SENTINEL LYMPH NODE BX;  Surgeon: Rolm Bookbinder, MD;  Location: [facility];  Service: General;  Laterality: Left;   COLONOSCOPY  05/2011   WNL   WISDOM TOOTH EXTRACTION       Family History  Problem Relation Age of Onset   Squamous cell carcinoma Father    Cancer Father    Diabetes Father    Hypertension Mother    Diabetes Sister     Social History   Tobacco Use   Smoking status: Never   Smokeless tobacco: Never  Vaping Use   Vaping Use: Never used  Substance Use Topics   Alcohol use: Yes    Comment: 1-2 drinks a month   Drug use: No     Current Outpatient Medications:    albuterol (VENTOLIN HFA) 108 (90 Base) MCG/ACT inhaler, INHALE 1-2 PUFFS INTO THE LUNGS EVERY 6 (SIX) HOURS AS NEEDED FOR WHEEZING OR SHORTNESS OF BREATH., Disp: 18 g, Rfl: 1   cyclobenzaprine (FLEXERIL) 5 MG tablet, Take 1 tablet (5 mg total) by mouth 2 (two) times daily as needed for muscle spasms., Disp: 30 tablet, Rfl: 0   fenofibrate (TRICOR) 48 MG tablet, TAKE 1 TABLET (48 MG TOTAL) BY MOUTH DAILY., Disp: 90 tablet, Rfl: 3   glipiZIDE (GLUCOTROL) 5 MG tablet, TAKE 1 TO 2 TABLETS BY MOUTH DAILY WITH EVENING MEAL, Disp: 180 tablet, Rfl: 0   glucose blood (FREESTYLE TEST STRIPS) test strip, UAD for bid blood glucose testing Dx: E11.65, Disp: 100 each, Rfl: 12   glucose monitoring kit (FREESTYLE) monitoring kit, 1 each by Does not apply route as needed for other. UAD  for bid blood glucose testing Dx: E11.65, Disp: 1 each, Rfl: 1   hydrocortisone-pramoxine (ANALPRAM-HC) 2.5-1 % rectal cream, APPLY TO THE AFFECTED AREA(S) UP TO 2 TIMES DAILY, Disp: 30 g, Rfl: 3   ibuprofen (ADVIL) 800 MG tablet, Take 1 tablet (800 mg total) by mouth every 8 (eight) hours as needed., Disp: 30 tablet, Rfl: 0   Lancets (FREESTYLE) lancets, UAD for bid blood glucose testing Dx: E11.65, Disp: 100 each, Rfl: 12   meloxicam (MOBIC) 7.5 MG tablet, TAKE 1 TABLET BY MOUTH ONCE DAILY AS NEEDED WITH FOOD., Disp: 30 tablet, Rfl: 2   metFORMIN (GLUCOPHAGE-XR) 750 MG 24 hr tablet, TAKE 2 TABLETS BY MOUTH DAILY AT BEDTIME, Disp: 180 tablet, Rfl: 3   oxybutynin (DITROPAN-XL) 10 MG 24 hr tablet, Take 10 mg by  mouth every morning., Disp: , Rfl:    Pramoxine-HC (HYDROCORTISONE ACE-PRAMOXINE) 2.5-1 % CREA, hydrocortisone-pramoxine 2.5 %-1 % rectal cream  APPLY TO THE AFFECTED AREA(S) UP TO 2 TIMES DAILY, Disp: , Rfl:    Semaglutide 7 MG TABS, TAKE 1 TABLET BY MOUTH ONCE DAILY BEFORE BREAKFAST., Disp: 90 tablet, Rfl: 3   sertraline (ZOLOFT) 50 MG tablet, Take 50 mg by mouth every morning. , Disp: , Rfl:    tamoxifen (NOLVADEX) 20 MG tablet, TAKE 1 TABLET BY MOUTH DAILY., Disp: 90 tablet, Rfl: 3  Allergies  Allergen Reactions   Erythromycin Other (See Comments)    Stomach pain   B-Complex-B-12 [B Complex Vitamins] Itching and Rash    Itchy skin   Tape Rash      ROS: See pertinent positives and negatives per HPI.   EXAM:  VITALS per patient if applicable: BP 932/35 Comment: last bp 05/08/21 recorded  Ht $R'5\' 8"'xu$  (1.727 m)   Wt 194 lb (88 kg) Comment: Video visit today  LMP 11/08/2013   BMI 29.50 kg/m    GENERAL: alert, oriented, in no acute distress but appears uncomfortable  NECK: normal movements of the head and neck  LUNGS: on inspection no signs of respiratory distress, breathing rate appears normal, no obvious gross SOB, gasping or wheezing, no conversational dyspnea  CV: no obvious cyanosis  MS: moves all visible extremities without noticeable abnormality  PSYCH/NEURO: pleasant and cooperative, speech and thought processing grossly intact   ASSESSMENT AND PLAN: 1. Spasm of muscle of lower back - Heating pad TID or more often - stretching/ROM exercises at least once per day Rx: - cyclobenzaprine (FLEXERIL) 5 MG tablet; Take 1 tablet (5 mg total) by mouth 2 (two) times daily as needed for muscle spasms.  Dispense: 30 tablet; Refill: 0 - ibuprofen (ADVIL) 800 MG tablet; Take 1 tablet (800 mg total) by mouth every 8 (eight) hours as needed.  Dispense: 30 tablet; Refill: 0 - f/u with PCP if symptoms worsen or do not improve in 1wk Discussed plan and reviewed medications with  patient, including risks, benefits, and potential side effects. Pt expressed understand. All questions answered.   I discussed the assessment and treatment plan with the patient. The patient was provided an opportunity to ask questions and all were answered. The patient agreed with the plan and demonstrated an understanding of the instructions.   The patient was advised to call back or seek an in-person evaluation if the symptoms worsen or if the condition fails to improve as anticipated.   Letta Median, DO

## 2021-06-28 NOTE — Patient Instructions (Addendum)
Heating pad 2-3x/day for 15-20 min on then off Take ibuprofen 1 tab 2-3x/day with food x 3-5 days  Take flexeril 5mg  at bedtime - can take in AM but may make you sleepy Stretching exercises after heating pad

## 2021-06-29 ENCOUNTER — Other Ambulatory Visit (HOSPITAL_COMMUNITY): Payer: Self-pay

## 2021-07-04 ENCOUNTER — Other Ambulatory Visit: Payer: Self-pay

## 2021-07-04 ENCOUNTER — Other Ambulatory Visit (HOSPITAL_COMMUNITY): Payer: Self-pay

## 2021-07-04 ENCOUNTER — Encounter: Payer: Self-pay | Admitting: Internal Medicine

## 2021-07-04 MED ORDER — SERTRALINE HCL 50 MG PO TABS
50.0000 mg | ORAL_TABLET | Freq: Every day | ORAL | 3 refills | Status: DC
  Filled 2021-07-04: qty 90, 90d supply, fill #0
  Filled 2021-09-27: qty 90, 90d supply, fill #1
  Filled 2022-01-02: qty 90, 90d supply, fill #2
  Filled 2022-04-01: qty 90, 90d supply, fill #3

## 2021-07-05 ENCOUNTER — Other Ambulatory Visit (HOSPITAL_COMMUNITY): Payer: Self-pay

## 2021-07-06 ENCOUNTER — Other Ambulatory Visit (HOSPITAL_COMMUNITY): Payer: Self-pay

## 2021-07-06 ENCOUNTER — Encounter: Payer: Self-pay | Admitting: Internal Medicine

## 2021-07-06 ENCOUNTER — Encounter: Payer: Self-pay | Admitting: Family Medicine

## 2021-07-06 DIAGNOSIS — E1165 Type 2 diabetes mellitus with hyperglycemia: Secondary | ICD-10-CM

## 2021-07-06 MED ORDER — FREESTYLE LITE TEST VI STRP
ORAL_STRIP | 12 refills | Status: DC
  Filled 2021-07-06: qty 100, 50d supply, fill #0

## 2021-07-09 ENCOUNTER — Other Ambulatory Visit (HOSPITAL_COMMUNITY): Payer: Self-pay

## 2021-07-10 ENCOUNTER — Telehealth: Payer: Self-pay | Admitting: Family Medicine

## 2021-07-10 NOTE — Telephone Encounter (Signed)
Pt would like to know if she needs to fast for her upcoming surgical clearance appointmert with Dr. Ethelene Hal on 07/17/21. Please advise pt

## 2021-07-10 NOTE — Telephone Encounter (Signed)
Responded via Mychart message

## 2021-07-17 ENCOUNTER — Other Ambulatory Visit: Payer: Self-pay

## 2021-07-17 ENCOUNTER — Encounter: Payer: Self-pay | Admitting: Family Medicine

## 2021-07-17 ENCOUNTER — Ambulatory Visit (INDEPENDENT_AMBULATORY_CARE_PROVIDER_SITE_OTHER): Payer: No Typology Code available for payment source | Admitting: Family Medicine

## 2021-07-17 VITALS — BP 122/68 | HR 82 | Temp 97.0°F | Ht 68.0 in | Wt 190.6 lb

## 2021-07-17 DIAGNOSIS — Z01818 Encounter for other preprocedural examination: Secondary | ICD-10-CM | POA: Insufficient documentation

## 2021-07-17 NOTE — Progress Notes (Signed)
Established Patient Office Visit  Subjective:  Patient ID: Veronica Rogers, female    DOB: 04-Jan-1961  Age: 60 y.o. MRN: [id number]  CC:  Chief Complaint  Patient presents with   Medical Clearance    Patient here for medical clearance for knee surgery. No concerns patient fasting for labs.     HPI Veronica Rogers presents for preop evaluation for clearance for right total knee replacement.  She is ready for surgery.  Her blood pressure and diabetes are well controlled.  She has been able to lose almost 10 pounds.  Recent lipid profile shows triglycerides at 189, LDL at 103 and an HDL 40.  On low-dose fenofibrate.  She had tried a statin in the 90s and it made her left arm hurt.  She thinks it may have been atorvastatin.  Past Medical History:  Diagnosis Date   Anxiety    Breast cancer (HCC)    Contact lens/glasses fitting    wears contacts or glasses   Depression    Diabetes (HCC) 11/22/2014   Hyperlipidemia    Personal history of chemotherapy    Personal history of radiation therapy    Radiation 03/28/14-05/06/14   Left Breast 60 Gy    Past Surgical History:  Procedure Laterality Date   BREAST BIOPSY  1996   right breast, benign   BREAST LUMPECTOMY WITH NEEDLE LOCALIZATION AND AXILLARY SENTINEL LYMPH NODE BX Left 10/29/2013   Procedure: BREAST LUMPECTOMY WITH NEEDLE LOCALIZATION AND AXILLARY SENTINEL LYMPH NODE BX;  Surgeon: Emelia Loron, MD;  Location: [facility];  Service: General;  Laterality: Left;   COLONOSCOPY  05/2011   WNL   WISDOM TOOTH EXTRACTION      Family History  Problem Relation Age of Onset   Squamous cell carcinoma Father    Cancer Father    Diabetes Father    Hypertension Mother    Diabetes Sister     Social History   Socioeconomic History   Marital status: Married    Spouse name: Not on file   Number of children: Not on file   Years of education: Not on file   Highest education level: Not on file  Occupational History    Not on file  Tobacco Use   Smoking status: Never   Smokeless tobacco: Never  Vaping Use   Vaping Use: Never used  Substance and Sexual Activity   Alcohol use: Yes    Comment: 1-2 drinks a month   Drug use: No   Sexual activity: Yes  Other Topics Concern   Not on file  Social History Narrative   Not on file   Social Determinants of Health   Financial Resource Strain: Not on file  Food Insecurity: Not on file  Transportation Needs: Not on file  Physical Activity: Not on file  Stress: Not on file  Social Connections: Not on file  Intimate Partner Violence: Not on file    Outpatient Medications Prior to Visit  Medication Sig Dispense Refill   albuterol (VENTOLIN HFA) 108 (90 Base) MCG/ACT inhaler INHALE 1-2 PUFFS INTO THE LUNGS EVERY 6 (SIX) HOURS AS NEEDED FOR WHEEZING OR SHORTNESS OF BREATH. 18 g 1   COVID-19 mRNA vaccine, Pfizer, 30 MCG/0.3ML injection COVID-19 vaccine, mRNA,(Pfizer) (PF) 30 mcg/0.3 mL IM susp (purple)     fenofibrate (TRICOR) 48 MG tablet TAKE 1 TABLET (48 MG TOTAL) BY MOUTH DAILY. 90 tablet 3   glipiZIDE (GLUCOTROL) 5 MG tablet TAKE 1 TO 2 TABLETS BY MOUTH  DAILY WITH EVENING MEAL 180 tablet 0   glucose blood (FREESTYLE LITE) test strip Use as directed twice daily for glucose testing 100 each 12   glucose monitoring kit (FREESTYLE) monitoring kit 1 each by Does not apply route as needed for other. UAD for bid blood glucose testing Dx: E11.65 1 each 1   ibuprofen (ADVIL) 800 MG tablet Take 1 tablet (800 mg total) by mouth every 8 (eight) hours as needed. 30 tablet 0   Lancets (FREESTYLE) lancets UAD for bid blood glucose testing Dx: E11.65 100 each 12   meloxicam (MOBIC) 7.5 MG tablet TAKE 1 TABLET BY MOUTH ONCE DAILY AS NEEDED WITH FOOD. 30 tablet 2   metFORMIN (GLUCOPHAGE-XR) 750 MG 24 hr tablet TAKE 2 TABLETS BY MOUTH DAILY AT BEDTIME 180 tablet 3   oxybutynin (DITROPAN-XL) 10 MG 24 hr tablet Take 10 mg by mouth every morning.     Semaglutide 7 MG TABS  TAKE 1 TABLET BY MOUTH ONCE DAILY BEFORE BREAKFAST. 90 tablet 3   sertraline (ZOLOFT) 50 MG tablet Take 50 mg by mouth every morning.      sertraline (ZOLOFT) 50 MG tablet Take 1 tablet (50 mg total) by mouth daily. 90 tablet 3   tamoxifen (NOLVADEX) 20 MG tablet TAKE 1 TABLET BY MOUTH DAILY. 90 tablet 3   cyclobenzaprine (FLEXERIL) 5 MG tablet Take 1 tablet (5 mg total) by mouth 2 (two) times daily as needed for muscle spasms. (Patient not taking: Reported on 07/17/2021) 30 tablet 0   Pramoxine-HC (HYDROCORTISONE ACE-PRAMOXINE) 2.5-1 % CREA hydrocortisone-pramoxine 2.5 %-1 % rectal cream  APPLY TO THE AFFECTED AREA(S) UP TO 2 TIMES DAILY (Patient not taking: Reported on 07/17/2021)     hydrocortisone-pramoxine (ANALPRAM-HC) 2.5-1 % rectal cream APPLY TO THE AFFECTED AREA(S) UP TO 2 TIMES DAILY 30 g 3   No facility-administered medications prior to visit.    Allergies  Allergen Reactions   Erythromycin Other (See Comments)    Stomach pain   B-Complex-B-12 [B Complex Vitamins] Itching and Rash    Itchy skin   Tape Rash    ROS Review of Systems  Constitutional: Negative.  Negative for unexpected weight change.  Respiratory: Negative.    Cardiovascular: Negative.   Gastrointestinal: Negative.   Musculoskeletal:  Positive for arthralgias.  Neurological:  Negative for speech difficulty and weakness.  Psychiatric/Behavioral: Negative.       Objective:    Physical Exam  BP 122/68   Pulse 82   Temp (!) 97 F (36.1 C) (Temporal)   Ht $R'5\' 8"'QU$  (1.727 m)   Wt 190 lb 9.6 oz (86.5 kg)   LMP 11/08/2013   SpO2 97%   BMI 28.98 kg/m  Wt Readings from Last 3 Encounters:  07/17/21 190 lb 9.6 oz (86.5 kg)  06/28/21 194 lb (88 kg)  05/08/21 194 lb (88 kg)     Health Maintenance Due  Topic Date Due   Zoster Vaccines- Shingrix (1 of 2) Never done   FOOT EXAM  07/25/2017   Pneumococcal Vaccine 10-48 Years old (2 - PCV) 09/12/2020   COLONOSCOPY (Pts 45-23yrs Insurance coverage will need to  be confirmed)  05/30/2021    There are no preventive care reminders to display for this patient.  Lab Results  Component Value Date   TSH 1.86 09/13/2019   Lab Results  Component Value Date   WBC 6.3 05/08/2021   HGB 12.0 05/08/2021   HCT 37.0 05/08/2021   MCV 81.8 05/08/2021   PLT 202.0 05/08/2021  Lab Results  Component Value Date   NA 140 05/08/2021   K 4.2 05/08/2021   CHLORIDE 103 01/17/2015   CO2 29 05/08/2021   GLUCOSE 135 (H) 05/08/2021   BUN 23 05/08/2021   CREATININE 0.77 05/08/2021   BILITOT 0.4 05/08/2021   ALKPHOS 36 (L) 05/08/2021   AST 19 05/08/2021   ALT 15 05/08/2021   PROT 6.6 05/08/2021   ALBUMIN 4.2 05/08/2021   CALCIUM 9.6 05/08/2021   ANIONGAP 10 01/17/2015   EGFR >90 01/17/2015   GFR 83.72 05/08/2021   Lab Results  Component Value Date   CHOL 181 05/08/2021   Lab Results  Component Value Date   HDL 40.00 05/08/2021   Lab Results  Component Value Date   LDLCALC 103 (H) 05/08/2021   Lab Results  Component Value Date   TRIG 189.0 (H) 05/08/2021   Lab Results  Component Value Date   CHOLHDL 5 05/08/2021   Lab Results  Component Value Date   HGBA1C 6.9 (A) 05/08/2021      Assessment & Plan:   Problem List Items Addressed This Visit       Other   Pre-op evaluation - Primary    No orders of the defined types were placed in this encounter.   Follow-up: Return Cleared for knee replacement surgery..  Not certain that fenofibrate is the best choice for her lipid profile.  Would like to see her LDL lower.  We discussed reintroducing a statin after she recovers from her upcoming knee surgery.  Given information on dyslipidemia.  Follow-up in November for prescheduled recheck.  Libby Maw, MD

## 2021-07-24 ENCOUNTER — Other Ambulatory Visit (HOSPITAL_COMMUNITY): Payer: Self-pay

## 2021-07-24 MED FILL — Tamoxifen Citrate Tab 20 MG (Base Equivalent): ORAL | 90 days supply | Qty: 90 | Fill #1 | Status: AC

## 2021-07-31 NOTE — Progress Notes (Signed)
DUE TO COVID-19 ONLY ONE VISITOR IS ALLOWED TO COME WITH YOU AND STAY IN THE WAITING ROOM ONLY DURING PRE OP AND PROCEDURE DAY OF SURGERY.  2 VISITOR  MAY VISIT WITH YOU AFTER SURGERY IN YOUR PRIVATE ROOM DURING VISITING HOURS ONLY!  YOU NEED TO HAVE A COVID 19 TEST ON__8/12/22 ____'@_'$  '@_from'$  8am-3pm _____, THIS TEST MUST BE DONE BEFORE SURGERY,  Covid test is done at [place], [place] [address].  This is a drive thru.  No appt required. Please see map.                 Your procedure is scheduled on:             08/14/2021   Report to [facility] Main  Entrance   Report to admitting at     1045AM     Call this number if you have problems the morning of surgery [phone]    REMEMBER: NO  SOLID FOOD CANDY OR GUM AFTER MIDNIGHT. CLEAR LIQUIDS UNTIL  1000am          . NOTHING BY MOUTH EXCEPT CLEAR LIQUIDS UNTIL        1000am  . PLEASE FINISH ENSURE DRINK PER SURGEON ORDER  WHICH NEEDS TO BE COMPLETED AT  1000 am     .      CLEAR LIQUID DIET   Foods Allowed                                                                    Coffee and tea, regular and decaf                            Fruit ices (not with fruit pulp)                                      Iced Popsicles                                    Carbonated beverages, regular and diet                                    Cranberry, grape and apple juices Sports drinks like Gatorade Lightly seasoned clear broth or consume(fat free) Sugar, honey syrup ___________________________________________________________________      BRUSH YOUR TEETH MORNING OF SURGERY AND RINSE YOUR MOUTH OUT, NO CHEWING GUM CANDY OR MINTS.     Take these medicines the morning of surgery with A SIP OF WATER:    Tamosifen, zoloft, ditropan , inahlers as usual and bring    DO NOT TAKE ANY DIABETIC MEDICATIONS DAY OF YOUR SURGERY                               You may not have any metal on your body including hair pins and  piercings  Do not wear jewelry, make-up, lotions, powders or perfumes, deodorant             Do not wear nail polish on your fingernails.  Do not shave  48 hours prior to surgery.              Men may shave face and neck.   Do not bring valuables to the hospital. [place].  Contacts, dentures or bridgework may not be worn into surgery.  Leave suitcase in the car. After surgery it may be brought to your room.     Patients discharged the day of surgery will not be allowed to drive home. IF YOU ARE HAVING SURGERY AND GOING HOME THE SAME DAY, YOU MUST HAVE AN ADULT TO DRIVE YOU HOME AND BE WITH YOU FOR 24 HOURS. YOU MAY GO HOME BY TAXI OR UBER OR ORTHERWISE, BUT AN ADULT MUST ACCOMPANY YOU HOME AND STAY WITH YOU FOR 24 HOURS.  Name and phone number of your driver:  Special Instructions: N/A              Please read over the following fact sheets you were given: _____________________________________________________________________  [facility] - Preparing for Surgery Before surgery, you can play an important role.  Because skin is not sterile, your skin needs to be as free of germs as possible.  You can reduce the number of germs on your skin by washing with CHG (chlorahexidine gluconate) soap before surgery.  CHG is an antiseptic cleaner which kills germs and bonds with the skin to continue killing germs even after washing. Please DO NOT use if you have an allergy to CHG or antibacterial soaps.  If your skin becomes reddened/irritated stop using the CHG and inform your nurse when you arrive at Short Stay. Do not shave (including legs and underarms) for at least 48 hours prior to the first CHG shower.  You may shave your face/neck. Please follow these instructions carefully:  1.  Shower with CHG Soap the night before surgery and the  morning of Surgery.  2.  If you choose to wash your hair, wash your hair first as usual with your  normal   shampoo.  3.  After you shampoo, rinse your hair and body thoroughly to remove the  shampoo.                           4.  Use CHG as you would any other liquid soap.  You can apply chg directly  to the skin and wash                       Gently with a scrungie or clean washcloth.  5.  Apply the CHG Soap to your body ONLY FROM THE NECK DOWN.   Do not use on face/ open                           Wound or open sores. Avoid contact with eyes, ears mouth and genitals (private parts).                       Wash face,  Genitals (private parts) with your normal soap.             6.  Wash thoroughly, paying special attention to the area where your surgery  will be performed.  7.  Thoroughly rinse your body with warm water from the neck down.  8.  DO NOT shower/wash with your normal soap after using and rinsing off  the CHG Soap.                9.  Pat yourself dry with a clean towel.            10.  Wear clean pajamas.            11.  Place clean sheets on your bed the night of your first shower and do not  sleep with pets. Day of Surgery : Do not apply any lotions/deodorants the morning of surgery.  Please wear clean clothes to the hospital/surgery center.  FAILURE TO FOLLOW THESE INSTRUCTIONS MAY RESULT IN THE CANCELLATION OF YOUR SURGERY PATIENT SIGNATURE_________________________________  NURSE SIGNATURE__________________________________  ________________________________________________________________________

## 2021-08-01 ENCOUNTER — Encounter: Payer: Self-pay | Admitting: Internal Medicine

## 2021-08-01 ENCOUNTER — Encounter: Payer: Self-pay | Admitting: Hematology and Oncology

## 2021-08-01 NOTE — Telephone Encounter (Signed)
Patient calling for new referrals for insurance I see that they were ordered in June not sure the status for the referrals? Please advise and let me know if we need to put in order for new referral for Dr. Katy Fitch ophthalmology.

## 2021-08-02 ENCOUNTER — Encounter (HOSPITAL_COMMUNITY)
Admission: RE | Admit: 2021-08-02 | Discharge: 2021-08-02 | Disposition: A | Discharge status: Home / Self Care | Payer: No Typology Code available for payment source | Source: Ambulatory Visit | Attending: Orthopedic Surgery | Admitting: Orthopedic Surgery

## 2021-08-02 ENCOUNTER — Other Ambulatory Visit: Payer: Self-pay

## 2021-08-02 ENCOUNTER — Encounter (HOSPITAL_COMMUNITY): Payer: Self-pay

## 2021-08-02 DIAGNOSIS — Z01818 Encounter for other preprocedural examination: Secondary | ICD-10-CM | POA: Insufficient documentation

## 2021-08-02 HISTORY — DX: Gastro-esophageal reflux disease without esophagitis: K21.9

## 2021-08-02 HISTORY — DX: Unspecified osteoarthritis, unspecified site: M19.90

## 2021-08-02 LAB — HEMOGLOBIN A1C
Hgb A1c MFr Bld: 6.7 % — ABNORMAL HIGH (ref 4.8–5.6)
Mean Plasma Glucose: 145.59 mg/dL

## 2021-08-02 LAB — COMPREHENSIVE METABOLIC PANEL
ALT: 19 U/L (ref 0–44)
AST: 23 U/L (ref 15–41)
Albumin: 4.1 g/dL (ref 3.5–5.0)
Alkaline Phosphatase: 30 U/L — ABNORMAL LOW (ref 38–126)
Anion gap: 9 (ref 5–15)
BUN: 22 mg/dL — ABNORMAL HIGH (ref 6–20)
CO2: 27 mmol/L (ref 22–32)
Calcium: 9.6 mg/dL (ref 8.9–10.3)
Chloride: 107 mmol/L (ref 98–111)
Creatinine, Ser: 0.78 mg/dL (ref 0.44–1.00)
GFR, Estimated: >60 mL/min (ref >60)
Glucose, Bld: 117 mg/dL — ABNORMAL HIGH (ref 70–99)
Potassium: 4.1 mmol/L (ref 3.5–5.1)
Sodium: 143 mmol/L (ref 135–145)
Total Bilirubin: 0.6 mg/dL (ref 0.3–1.2)
Total Protein: 6.8 g/dL (ref 6.5–8.1)

## 2021-08-02 LAB — CBC
HCT: 37.6 % (ref 36.0–46.0)
Hemoglobin: 11.7 g/dL — ABNORMAL LOW (ref 12.0–15.0)
MCH: 26.8 pg (ref 26.0–34.0)
MCHC: 31.1 g/dL (ref 30.0–36.0)
MCV: 86 fL (ref 80.0–100.0)
Platelets: 216 10*3/uL (ref 150–400)
RBC: 4.37 MIL/uL (ref 3.87–5.11)
RDW: 14.2 % (ref 11.5–15.5)
WBC: 6.8 10*3/uL (ref 4.0–10.5)
nRBC: 0 % (ref 0.0–0.2)

## 2021-08-02 LAB — SURGICAL PCR SCREEN
MRSA, PCR: NEGATIVE
Staphylococcus aureus: NEGATIVE

## 2021-08-02 LAB — TYPE AND SCREEN
ABO/RH(D): A POS
Antibody Screen: NEGATIVE

## 2021-08-02 LAB — GLUCOSE, CAPILLARY: Glucose-Capillary: 116 mg/dL — ABNORMAL HIGH (ref 70–99)

## 2021-08-02 NOTE — Progress Notes (Addendum)
Anesthesia Review:  PCP: dR wILLIAM kREMER- clearance 07/18/21 on chart LOV 07/17/21 on chart  Endocrinologist- DR Cruzita Lederer 05/08/21 LOV  Cardiologist : Chest x-ray : EKG : 08/02/21  Echo : Stress test: Cardiac Cath :  Activity level: can do a flight of stairs without difficulty Sleep Study/ CPAP : none  Fasting Blood Sugar :      / Checks Blood Sugar -- times a day:   Blood Thinner/ Instructions /Last Dose: ASA / Instructions/ Last Dose :   DM- type 2 checks glucose once daily per pt  Hgba1c-08/02/21-6.7

## 2021-08-03 ENCOUNTER — Other Ambulatory Visit: Payer: Self-pay

## 2021-08-03 DIAGNOSIS — Z170 Estrogen receptor positive status [ER+]: Secondary | ICD-10-CM

## 2021-08-03 DIAGNOSIS — C50212 Malignant neoplasm of upper-inner quadrant of left female breast: Secondary | ICD-10-CM

## 2021-08-03 DIAGNOSIS — Z0000 Encounter for general adult medical examination without abnormal findings: Secondary | ICD-10-CM

## 2021-08-03 DIAGNOSIS — E1165 Type 2 diabetes mellitus with hyperglycemia: Secondary | ICD-10-CM

## 2021-08-03 NOTE — Telephone Encounter (Signed)
Done

## 2021-08-06 ENCOUNTER — Other Ambulatory Visit (HOSPITAL_COMMUNITY): Payer: Self-pay

## 2021-08-06 ENCOUNTER — Encounter (HOSPITAL_COMMUNITY): Payer: Self-pay | Admitting: Physician Assistant

## 2021-08-06 MED ORDER — CARESTART COVID-19 HOME TEST VI KIT
PACK | 0 refills | Status: DC
  Filled 2021-08-06: qty 4, 4d supply, fill #0

## 2021-08-07 ENCOUNTER — Ambulatory Visit: Payer: No Typology Code available for payment source | Admitting: Family Medicine

## 2021-08-08 ENCOUNTER — Encounter: Payer: Self-pay | Admitting: Family Medicine

## 2021-08-09 ENCOUNTER — Encounter: Payer: Self-pay | Admitting: Family Medicine

## 2021-08-09 NOTE — Telephone Encounter (Signed)
Please advise messages below, if it is okay to wrote note stating that patient had positive covid at home test and send to Dr. Alvan Dame?

## 2021-08-14 ENCOUNTER — Ambulatory Visit (HOSPITAL_COMMUNITY)
Admission: RE | Admit: 2021-08-14 | Payer: No Typology Code available for payment source | Source: Ambulatory Visit | Admitting: Orthopedic Surgery

## 2021-08-14 ENCOUNTER — Encounter (HOSPITAL_COMMUNITY): Admission: RE | Payer: Self-pay | Source: Ambulatory Visit

## 2021-08-14 SURGERY — ARTHROPLASTY, KNEE, TOTAL
Anesthesia: Spinal | Site: Knee | Laterality: Right

## 2021-08-17 ENCOUNTER — Ambulatory Visit: Payer: No Typology Code available for payment source

## 2021-08-20 ENCOUNTER — Ambulatory Visit: Payer: No Typology Code available for payment source

## 2021-08-20 NOTE — Progress Notes (Signed)
Please enter orders for PAT visit scheduled for 09-04-21

## 2021-08-24 ENCOUNTER — Encounter: Payer: No Typology Code available for payment source | Admitting: Physical Therapy

## 2021-08-30 ENCOUNTER — Other Ambulatory Visit (HOSPITAL_COMMUNITY): Payer: Self-pay

## 2021-08-30 MED FILL — Oxybutynin Chloride Tab ER 24HR 10 MG: ORAL | 90 days supply | Qty: 90 | Fill #1 | Status: AC

## 2021-08-31 NOTE — Patient Instructions (Addendum)
DUE TO COVID-19 ONLY ONE VISITOR IS ALLOWED TO COME WITH YOU AND STAY IN THE WAITING ROOM ONLY DURING PRE OP AND PROCEDURE.   **NO VISITORS ARE ALLOWED IN THE SHORT STAY AREA OR RECOVERY ROOM!!**  IF YOU WILL BE ADMITTED INTO THE HOSPITAL YOU ARE ALLOWED ONLY TWO SUPPORT PEOPLE DURING VISITATION HOURS ONLY (10AM -8PM)   The support person(s) may change daily. The support person(s) must pass our screening, gel in and out, and wear a mask at all times, including in the patient's room. Patients must also wear a mask when staff or their support person are in the room.  No visitors under the age of 26. Any visitor under the age of 46 must be accompanied by an adult.        Your procedure is scheduled on: 09/11/21   Report to [facility] Main  Entrance    Report to admitting at 6:00 AM   Call this number if you have problems the morning of surgery [phone]   Do not eat food :After Midnight.   May have liquids until 5:40 AM day of surgery  CLEAR LIQUID DIET  Foods Allowed                                                                     Foods Excluded  Water, Black Coffee and tea (No milk or creamer)          liquids that you cannot  Plain Jell-O in any flavor  (No red)                                   see through such as: Fruit ices (not with fruit pulp)                                           milk, soups, orange juice              Iced Popsicles (No red)                                               All solid food                                   Apple juices Sports drinks like Gatorade (No red) Lightly seasoned clear broth or consume(fat free) Sugar   Oral Hygiene is also important to reduce your risk of infection.                                    Remember - BRUSH YOUR TEETH THE MORNING OF SURGERY WITH YOUR REGULAR TOOTHPASTE   Take these medicines the morning of surgery with A SIP OF WATER: Inhalers, Fenofibrate, Oxybutynin, Sertraline, Tamoxifen.   DO NOT  TAKE ANY ORAL DIABETIC MEDICATIONS DAY OF  YOUR SURGERY  How to Manage Your Diabetes Before and After Surgery  Why is it important to control my blood sugar before and after surgery? Improving blood sugar levels before and after surgery helps healing and can limit problems. A way of improving blood sugar control is eating a healthy diet by:  Eating less sugar and carbohydrates  Increasing activity/exercise  Talking with your doctor about reaching your blood sugar goals High blood sugars (greater than 180 mg/dL) can raise your risk of infections and slow your recovery, so you will need to focus on controlling your diabetes during the weeks before surgery. Make sure that the doctor who takes care of your diabetes knows about your planned surgery including the date and location.  How do I manage my blood sugar before surgery? Check your blood sugar at least 4 times a day, starting 2 days before surgery, to make sure that the level is not too high or low. Check your blood sugar the morning of your surgery when you wake up and every 2 hours until you get to the Short Stay unit. If your blood sugar is less than 70 mg/dL, you will need to treat for low blood sugar: Do not take insulin. Treat a low blood sugar (less than 70 mg/dL) with  cup of clear juice (cranberry or apple), 4 glucose tablets, OR glucose gel. Recheck blood sugar in 15 minutes after treatment (to make sure it is greater than 70 mg/dL). If your blood sugar is not greater than 70 mg/dL on recheck, call [phone] for further instructions. Report your blood sugar to the short stay nurse when you get to Short Stay.  If you are admitted to the hospital after surgery: Your blood sugar will be checked by the staff and you will probably be given insulin after surgery (instead of oral diabetes medicines) to make sure you have good blood sugar levels. The goal for blood sugar control after surgery is 80-180 mg/dL.   WHAT DO I DO ABOUT  MY DIABETES MEDICATION?  Do not take oral diabetes medicines (pills) the morning of surgery.  THE DAY BEFORE SURGERY, take Metformin and Semaglutide as prescribed. Do not take Glipizide.  THE MORNING OF SURGERY, take do not take any diabetic medications   Reviewed and Endorsed by [facility] Patient Education Committee, August 2015                               You may not have any metal on your body including hair pins, jewelry, and body piercing             Do not wear make-up, lotions, powders, perfumes, or deodorant  Do not wear nail polish including gel and S&S, artificial/acrylic nails, or any other type of covering on natural nails including finger and toenails. If you have artificial nails, gel coating, etc. that needs to be removed by a nail salon please have this removed prior to surgery or surgery may need to be canceled/ delayed if the surgeon/ anesthesia feels like they are unable to be safely monitored.   Do not shave  48 hours prior to surgery.    Do not bring valuables to the hospital. [place].   Contacts, dentures or bridgework may not be worn into surgery.   Bring small overnight bag day of surgery.  Please read over the following fact sheets you were given: IF YOU HAVE QUESTIONS ABOUT YOUR PRE OP INSTRUCTIONS PLEASE CALL [phone]- [facility] - Preparing for Surgery Before surgery, you can play an important role.  Because skin is not sterile, your skin needs to be as free of germs as possible.  You can reduce the number of germs on your skin by washing with CHG (chlorahexidine gluconate) soap before surgery.  CHG is an antiseptic cleaner which kills germs and bonds with the skin to continue killing germs even after washing. Please DO NOT use if you have an allergy to CHG or antibacterial soaps.  If your skin becomes reddened/irritated stop using the CHG and inform your nurse when you arrive at Short  Stay. Do not shave (including legs and underarms) for at least 48 hours prior to the first CHG shower.  You may shave your face/neck.  Please follow these instructions carefully:  1.  Shower with CHG Soap the night before surgery and the  morning of surgery.  2.  If you choose to wash your hair, wash your hair first as usual with your normal  shampoo.  3.  After you shampoo, rinse your hair and body thoroughly to remove the shampoo.                             4.  Use CHG as you would any other liquid soap.  You can apply chg directly to the skin and wash.  Gently with a scrungie or clean washcloth.  5.  Apply the CHG Soap to your body ONLY FROM THE NECK DOWN.   Do   not use on face/ open                           Wound or open sores. Avoid contact with eyes, ears mouth and   genitals (private parts).                       Wash face,  Genitals (private parts) with your normal soap.             6.  Wash thoroughly, paying special attention to the area where your    surgery  will be performed.  7.  Thoroughly rinse your body with warm water from the neck down.  8.  DO NOT shower/wash with your normal soap after using and rinsing off the CHG Soap.                9.  Pat yourself dry with a clean towel.            10.  Wear clean pajamas.            11.  Place clean sheets on your bed the night of your first shower and do not  sleep with pets. Day of Surgery : Do not apply any lotions/deodorants the morning of surgery.  Please wear clean clothes to the hospital/surgery center.  FAILURE TO FOLLOW THESE INSTRUCTIONS MAY RESULT IN THE CANCELLATION OF YOUR SURGERY  PATIENT SIGNATURE_________________________________  NURSE SIGNATURE__________________________________  ________________________________________________________________________

## 2021-08-31 NOTE — Progress Notes (Addendum)
COVID swab appointment: N/a  COVID Vaccine Completed: yes x3 Date COVID Vaccine completed: 01/04/20, 01/27/20 Has received booster: 12/08/20 COVID vaccine manufacturer: Pfizer      Date of COVID positive in last 90 days: pos 08/05/21 on chart  Pre op clearance by Ali Lowe 07/05/21 on chart  PCP - Abelino Derrick, MD Cardiologist -  N/a  Chest x-ray - N/a EKG - 08/02/21 Epic Stress Test - N/a ECHO -  N/a Cardiac Cath - N/a Pacemaker/ICD device last checked: N/a Spinal Cord Stimulator: N/a  Sleep Study - N/a CPAP -   Fasting Blood Sugar - 90-130 Checks Blood Sugar __1-2_ times a day. Not checking lately   Blood Thinner Instructions: N/a Aspirin Instructions: Last Dose:  Activity level: Can go up a flight of stairs and perform activities of daily living without stopping and without symptoms of chest pain or shortness of breath.      Anesthesia review:   Patient denies shortness of breath, fever, cough and chest pain at PAT appointment   Patient verbalized understanding of instructions that were given to them at the PAT appointment. Patient was also instructed that they will need to review over the PAT instructions again at home before surgery.

## 2021-09-04 ENCOUNTER — Other Ambulatory Visit: Payer: Self-pay

## 2021-09-04 ENCOUNTER — Encounter: Payer: No Typology Code available for payment source | Admitting: Physical Therapy

## 2021-09-04 ENCOUNTER — Encounter (HOSPITAL_COMMUNITY): Payer: Self-pay

## 2021-09-04 ENCOUNTER — Encounter (HOSPITAL_COMMUNITY)
Admission: RE | Admit: 2021-09-04 | Discharge: 2021-09-04 | Disposition: A | Discharge status: Home / Self Care | Payer: No Typology Code available for payment source | Source: Ambulatory Visit | Attending: Orthopedic Surgery | Admitting: Orthopedic Surgery

## 2021-09-04 DIAGNOSIS — Z01812 Encounter for preprocedural laboratory examination: Secondary | ICD-10-CM | POA: Insufficient documentation

## 2021-09-04 LAB — BASIC METABOLIC PANEL
Anion gap: 8 (ref 5–15)
BUN: 15 mg/dL (ref 6–20)
CO2: 25 mmol/L (ref 22–32)
Calcium: 9.7 mg/dL (ref 8.9–10.3)
Chloride: 106 mmol/L (ref 98–111)
Creatinine, Ser: 0.75 mg/dL (ref 0.44–1.00)
GFR, Estimated: >60 mL/min (ref >60)
Glucose, Bld: 229 mg/dL — ABNORMAL HIGH (ref 70–99)
Potassium: 3.8 mmol/L (ref 3.5–5.1)
Sodium: 139 mmol/L (ref 135–145)

## 2021-09-04 LAB — CBC
HCT: 38.1 % (ref 36.0–46.0)
Hemoglobin: 11.9 g/dL — ABNORMAL LOW (ref 12.0–15.0)
MCH: 26.8 pg (ref 26.0–34.0)
MCHC: 31.2 g/dL (ref 30.0–36.0)
MCV: 85.8 fL (ref 80.0–100.0)
Platelets: 203 10*3/uL (ref 150–400)
RBC: 4.44 MIL/uL (ref 3.87–5.11)
RDW: 14 % (ref 11.5–15.5)
WBC: 6.4 10*3/uL (ref 4.0–10.5)
nRBC: 0 % (ref 0.0–0.2)

## 2021-09-04 LAB — SURGICAL PCR SCREEN
MRSA, PCR: NEGATIVE
Staphylococcus aureus: NEGATIVE

## 2021-09-04 LAB — GLUCOSE, CAPILLARY: Glucose-Capillary: 232 mg/dL — ABNORMAL HIGH (ref 70–99)

## 2021-09-05 ENCOUNTER — Other Ambulatory Visit (HOSPITAL_COMMUNITY): Payer: Self-pay

## 2021-09-05 ENCOUNTER — Other Ambulatory Visit: Payer: Self-pay | Admitting: Internal Medicine

## 2021-09-05 MED ORDER — RYBELSUS 7 MG PO TABS
1.0000 | ORAL_TABLET | Freq: Every day | ORAL | 3 refills | Status: DC
  Filled 2021-09-05 – 2021-09-19 (×2): qty 90, 90d supply, fill #0
  Filled 2022-01-03: qty 90, 90d supply, fill #1
  Filled 2022-04-02: qty 90, 90d supply, fill #2
  Filled 2022-04-30: qty 90, 90d supply, fill #3

## 2021-09-06 ENCOUNTER — Ambulatory Visit: Payer: No Typology Code available for payment source | Admitting: Hematology and Oncology

## 2021-09-06 ENCOUNTER — Encounter: Payer: No Typology Code available for payment source | Admitting: Physical Therapy

## 2021-09-08 ENCOUNTER — Encounter: Payer: No Typology Code available for payment source | Admitting: Physical Therapy

## 2021-09-10 NOTE — Anesthesia Preprocedure Evaluation (Addendum)
Anesthesia Evaluation  Patient identified by MRN, date of birth, ID band Patient awake    Reviewed: Allergy & Precautions, NPO status , Patient's Chart, lab work & pertinent test results  Airway Mallampati: II  TM Distance: >3 FB     Dental   Pulmonary    breath sounds clear to auscultation       Cardiovascular negative cardio ROS   Rhythm:Regular Rate:Normal     Neuro/Psych  Neuromuscular disease    GI/Hepatic GERD  ,  Endo/Other  diabetes  Renal/GU      Musculoskeletal  (+) Arthritis ,   Abdominal   Peds  Hematology   Anesthesia Other Findings   Reproductive/Obstetrics                            Anesthesia Physical Anesthesia Plan  ASA: 3  Anesthesia Plan: Spinal   Post-op Pain Management:  Regional for Post-op pain   Induction:   PONV Risk Score and Plan: 2 and Ondansetron, Dexamethasone and Midazolam  Airway Management Planned: Nasal Cannula and Simple Face Mask  Additional Equipment:   Intra-op Plan:   Post-operative Plan:   Informed Consent: I have reviewed the patients History and Physical, chart, labs and discussed the procedure including the risks, benefits and alternatives for the proposed anesthesia with the patient or authorized representative who has indicated his/her understanding and acceptance.     Dental advisory given  Plan Discussed with: Anesthesiologist  Anesthesia Plan Comments:        Anesthesia Quick Evaluation

## 2021-09-10 NOTE — H&P (Signed)
TOTAL KNEE ADMISSION H&P  Patient is being admitted for right total knee arthroplasty.  Subjective:  Chief Complaint:right knee pain.  HPI: Veronica Rogers, 60 y.o. female, has a history of pain and functional disability in the right knee due to arthritis and has failed non-surgical conservative treatments for greater than 12 weeks to includeNSAID's and/or analgesics, corticosteriod injections, and activity modification.  Onset of symptoms was gradual, starting 3 years ago with gradually worsening course since that time. The patient noted no past surgery on the right knee(s).  Patient currently rates pain in the right knee(s) at 8 out of 10 with activity. Patient has worsening of pain with activity and weight bearing and pain with passive range of motion.  Patient has evidence of joint space narrowing by imaging studies. There is no active infection.  Patient Active Problem List   Diagnosis Date Noted   Pre-op evaluation 07/17/2021   Wound of abdomen 05/04/2021   Carpal tunnel syndrome of left wrist 10/13/2020   Chronic pain of right knee 10/13/2020   Healthcare maintenance 09/13/2019   OA (osteoarthritis) of knee 11/11/2018   Hyperlipidemia 08/26/2017   Type 2 diabetes mellitus with hyperglycemia, without long-term current use of insulin ([place]) 01/08/2016   Allergic rhinitis 11/22/2014   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[place]) 10/14/2013   Past Medical History:  Diagnosis Date   Arthritis    Breast cancer ([place])    Contact lens/glasses fitting    wears contacts or glasses   Diabetes ([place]) 11/22/2014   GERD (gastroesophageal reflux disease)    Hyperlipidemia    Personal history of chemotherapy    Personal history of radiation therapy    Radiation 03/28/14-05/06/14   Left Breast 60 Gy    Past Surgical History:  Procedure Laterality Date   BREAST BIOPSY  1996   right breast, benign   BREAST LUMPECTOMY WITH NEEDLE LOCALIZATION AND AXILLARY SENTINEL LYMPH  NODE BX Left 10/29/2013   Procedure: BREAST LUMPECTOMY WITH NEEDLE LOCALIZATION AND AXILLARY SENTINEL LYMPH NODE BX;  Surgeon: Rolm Bookbinder, MD;  Location: [facility];  Service: General;  Laterality: Left;   COLONOSCOPY  05/2011   WNL   WISDOM TOOTH EXTRACTION      No current facility-administered medications for this encounter.   Current Outpatient Medications  Medication Sig Dispense Refill Last Dose   albuterol (VENTOLIN HFA) 108 (90 Base) MCG/ACT inhaler INHALE 1-2 PUFFS INTO THE LUNGS EVERY 6 (SIX) HOURS AS NEEDED FOR WHEEZING OR SHORTNESS OF BREATH. 18 g 1    Biotin 1000 MCG CHEW Chew 1,000 mg by mouth once a week.      fenofibrate (TRICOR) 48 MG tablet TAKE 1 TABLET (48 MG TOTAL) BY MOUTH DAILY. 90 tablet 3    glipiZIDE (GLUCOTROL) 5 MG tablet TAKE 1 TO 2 TABLETS BY MOUTH DAILY WITH EVENING MEAL 180 tablet 0    ibuprofen (ADVIL) 800 MG tablet Take 1 tablet (800 mg total) by mouth every 8 (eight) hours as needed. 30 tablet 0    meloxicam (MOBIC) 7.5 MG tablet TAKE 1 TABLET BY MOUTH ONCE DAILY AS NEEDED WITH FOOD. 30 tablet 2    metFORMIN (GLUCOPHAGE-XR) 750 MG 24 hr tablet TAKE 2 TABLETS BY MOUTH DAILY AT BEDTIME 180 tablet 3    oxybutynin (DITROPAN-XL) 10 MG 24 hr tablet Take 10 mg by mouth every morning.      sertraline (ZOLOFT) 50 MG tablet Take 1 tablet (50 mg total) by mouth daily. 90 tablet  3    tamoxifen (NOLVADEX) 20 MG tablet TAKE 1 TABLET BY MOUTH DAILY. 90 tablet 3    COVID-19 At Home Antigen Test (CARESTART COVID-19 HOME TEST) KIT use as directed 4 each 0    cyclobenzaprine (FLEXERIL) 5 MG tablet Take 1 tablet (5 mg total) by mouth 2 (two) times daily as needed for muscle spasms. (Patient not taking: Reported on 07/30/2021) 30 tablet 0    glucose blood (FREESTYLE LITE) test strip Use as directed twice daily for glucose testing 100 each 12    glucose monitoring kit (FREESTYLE) monitoring kit 1 each by Does not apply route as needed for other. UAD for bid blood  glucose testing Dx: E11.65 1 each 1    Lancets (FREESTYLE) lancets UAD for bid blood glucose testing Dx: E11.65 100 each 12    oxybutynin (DITROPAN-XL) 10 MG 24 hr tablet TAKE 1 TABLET EVERY DAY BY MOUTH IN THE MORNING 90 tablet 3    Semaglutide (RYBELSUS) 7 MG TABS TAKE 1 TABLET BY MOUTH ONCE DAILY BEFORE BREAKFAST. 90 tablet 3    Allergies  Allergen Reactions   Erythromycin Other (See Comments)    Stomach pain   B-Complex-B-12 [B Complex Vitamins] Itching and Rash    Itchy skin   Tape Rash    Social History   Tobacco Use   Smoking status: Never   Smokeless tobacco: Never  Substance Use Topics   Alcohol use: Yes    Comment: 1-2 drinks a month    Family History  Problem Relation Age of Onset   Squamous cell carcinoma Father    Cancer Father    Diabetes Father    Hypertension Mother    Diabetes Sister      Review of Systems  Constitutional:  Negative for chills and fever.  Respiratory:  Negative for cough and shortness of breath.   Cardiovascular:  Negative for chest pain.  Gastrointestinal:  Negative for nausea and vomiting.  Musculoskeletal:  Positive for arthralgias.   Objective:  Physical Exam Well nourished and well developed. General: Alert and oriented x3, cooperative and pleasant, no acute distress. Head: normocephalic, atraumatic, neck supple. Eyes: EOMI.  Musculoskeletal: Right Knee: Valgus deformity. No tenderness to palpation about the lateral joint line of the right knee. AROM 0-120 degrees. No effusion noted. No instability. Moderate patellofemoral crepitus.  Calves soft and nontender. Motor function intact in LE. Strength 5/5 LE bilaterally. Neuro: Distal pulses 2+. Sensation to light touch intact in LE.  Vital signs in last 24 hours:    Labs:   Estimated body mass index is 29.16 kg/m as calculated from the following:   Height as of 09/04/21: $RemoveBe'5\' 7"'RniJHrbid$  (1.702 m).   Weight as of 09/04/21: 84.5 kg.   Imaging Review Plain radiographs demonstrate  severe degenerative joint disease of the right knee(s). The overall alignment isneutral. The bone quality appears to be adequate for age and reported activity level.      Assessment/Plan:  End stage arthritis, right knee   The patient history, physical examination, clinical judgment of the provider and imaging studies are consistent with end stage degenerative joint disease of the right knee(s) and total knee arthroplasty is deemed medically necessary. The treatment options including medical management, injection therapy arthroscopy and arthroplasty were discussed at length. The risks and benefits of total knee arthroplasty were presented and reviewed. The risks due to aseptic loosening, infection, stiffness, patella tracking problems, thromboembolic complications and other imponderables were discussed. The patient acknowledged the explanation, agreed to proceed  with the plan and consent was signed. Patient is being admitted for inpatient treatment for surgery, pain control, PT, OT, prophylactic antibiotics, VTE prophylaxis, progressive ambulation and ADL's and discharge planning. The patient is planning to be discharged  home.  Therapy Plans: outpatient therapy at [facility]. Disposition: Home with husband Planned DVT Prophylaxis: aspirin 81mg  BID DME needed: walker PCP: Dr. Alfonso Ramus, clearance received Endocrinologist: Dr. Cruzita Lederer TXA: Allergies: tape - rash, B12 - rash, erythromycin - nausea Anesthesia Concerns: none BMI: 29.4 Last HgbA1c: 6.9%  Other:  - s/p breast CA in 2014, s/p lumpectomy - Oxycodone, celebrex, tylenol, robaxin  Patient's anticipated LOS is less than 2 midnights, meeting these requirements: - Younger than 93 - Lives within 1 hour of care - Has a competent adult at home to recover with post-op recover - NO history of  - Chronic pain requiring opiods  - Diabetes  - Coronary Artery Disease  - Heart failure  - Heart attack  - Stroke  - DVT/VTE  - Cardiac  arrhythmia  - Respiratory Failure/COPD  - Renal failure  - Anemia  - Advanced Liver disease  Griffith Citron, PA-C Orthopedic Surgery [facility] [place] [phone]

## 2021-09-11 ENCOUNTER — Ambulatory Visit (HOSPITAL_COMMUNITY): Payer: No Typology Code available for payment source | Admitting: Anesthesiology

## 2021-09-11 ENCOUNTER — Ambulatory Visit: Payer: No Typology Code available for payment source | Admitting: Internal Medicine

## 2021-09-11 ENCOUNTER — Observation Stay (HOSPITAL_COMMUNITY)
Admission: RE | Admit: 2021-09-11 | Discharge: 2021-09-12 | Disposition: A | Discharge status: Home / Self Care | Payer: No Typology Code available for payment source | Attending: Orthopedic Surgery | Admitting: Orthopedic Surgery

## 2021-09-11 ENCOUNTER — Encounter (HOSPITAL_COMMUNITY)
Admission: RE | Disposition: A | Discharge status: Home / Self Care | Payer: Self-pay | Source: Home / Self Care | Attending: Orthopedic Surgery

## 2021-09-11 ENCOUNTER — Encounter (HOSPITAL_COMMUNITY): Payer: Self-pay | Admitting: Orthopedic Surgery

## 2021-09-11 ENCOUNTER — Other Ambulatory Visit: Payer: Self-pay

## 2021-09-11 DIAGNOSIS — Z853 Personal history of malignant neoplasm of breast: Secondary | ICD-10-CM | POA: Diagnosis not present

## 2021-09-11 DIAGNOSIS — Z7984 Long term (current) use of oral hypoglycemic drugs: Secondary | ICD-10-CM | POA: Diagnosis not present

## 2021-09-11 DIAGNOSIS — E119 Type 2 diabetes mellitus without complications: Secondary | ICD-10-CM | POA: Insufficient documentation

## 2021-09-11 DIAGNOSIS — Z79899 Other long term (current) drug therapy: Secondary | ICD-10-CM | POA: Diagnosis not present

## 2021-09-11 DIAGNOSIS — Z96651 Presence of right artificial knee joint: Secondary | ICD-10-CM

## 2021-09-11 DIAGNOSIS — M1711 Unilateral primary osteoarthritis, right knee: Secondary | ICD-10-CM | POA: Diagnosis present

## 2021-09-11 HISTORY — PX: TOTAL KNEE ARTHROPLASTY: SHX125

## 2021-09-11 LAB — TYPE AND SCREEN
ABO/RH(D): A POS
Antibody Screen: NEGATIVE

## 2021-09-11 LAB — GLUCOSE, CAPILLARY
Glucose-Capillary: 113 mg/dL — ABNORMAL HIGH (ref 70–99)
Glucose-Capillary: 119 mg/dL — ABNORMAL HIGH (ref 70–99)

## 2021-09-11 SURGERY — ARTHROPLASTY, KNEE, TOTAL
Anesthesia: Spinal | Site: Knee | Laterality: Right

## 2021-09-11 MED ORDER — HYDROMORPHONE HCL 1 MG/ML IJ SOLN
0.5000 mg | INTRAMUSCULAR | Status: DC | PRN
  Administered 2021-09-11: 1 mg via INTRAVENOUS
  Filled 2021-09-11: qty 1

## 2021-09-11 MED ORDER — OXYCODONE HCL 5 MG PO TABS: 10.0000 mg | ORAL_TABLET | ORAL | Status: DC | PRN

## 2021-09-11 MED ORDER — TRANEXAMIC ACID-NACL 1000-0.7 MG/100ML-% IV SOLN
INTRAVENOUS | Status: AC
  Filled 2021-09-11: qty 100

## 2021-09-11 MED ORDER — OXYCODONE HCL 5 MG PO TABS
5.0000 mg | ORAL_TABLET | ORAL | Status: DC | PRN
  Administered 2021-09-11 (×2): 10 mg via ORAL
  Administered 2021-09-11: 5 mg via ORAL
  Administered 2021-09-12: 10 mg via ORAL
  Administered 2021-09-12 (×2): 5 mg via ORAL
  Filled 2021-09-11: qty 2
  Filled 2021-09-11 (×2): qty 1
  Filled 2021-09-11: qty 2
  Filled 2021-09-11 (×2): qty 1

## 2021-09-11 MED ORDER — DEXAMETHASONE SODIUM PHOSPHATE 10 MG/ML IJ SOLN
10.0000 mg | Freq: Once | INTRAMUSCULAR | Status: AC
  Administered 2021-09-12: 10 mg via INTRAVENOUS
  Filled 2021-09-11: qty 1

## 2021-09-11 MED ORDER — BUPIVACAINE-EPINEPHRINE (PF) 0.25% -1:200000 IJ SOLN
INTRAMUSCULAR | Status: DC | PRN
  Administered 2021-09-11: 30 mL

## 2021-09-11 MED ORDER — PROPOFOL 10 MG/ML IV BOLUS
INTRAVENOUS | Status: DC | PRN
Start: 2021-09-11 — End: 2021-09-11
  Administered 2021-09-11 (×2): 40 mg via INTRAVENOUS

## 2021-09-11 MED ORDER — ONDANSETRON HCL 4 MG/2ML IJ SOLN: 4.0000 mg | Freq: Four times a day (QID) | INTRAMUSCULAR | Status: DC | PRN

## 2021-09-11 MED ORDER — KETOROLAC TROMETHAMINE 30 MG/ML IJ SOLN
INTRAMUSCULAR | Status: AC
  Filled 2021-09-11: qty 1

## 2021-09-11 MED ORDER — MENTHOL 3 MG MT LOZG: 1.0000 | LOZENGE | OROMUCOSAL | Status: DC | PRN

## 2021-09-11 MED ORDER — VANCOMYCIN HCL 1000 MG IV SOLR
INTRAVENOUS | Status: AC
  Filled 2021-09-11: qty 20

## 2021-09-11 MED ORDER — CHLORHEXIDINE GLUCONATE CLOTH 2 % EX PADS
6.0000 | MEDICATED_PAD | Freq: Every day | CUTANEOUS | Status: DC
  Administered 2021-09-11: 6 via TOPICAL

## 2021-09-11 MED ORDER — CEFAZOLIN SODIUM-DEXTROSE 2-4 GM/100ML-% IV SOLN
2.0000 g | Freq: Four times a day (QID) | INTRAVENOUS | Status: AC
  Administered 2021-09-11 (×2): 2 g via INTRAVENOUS
  Filled 2021-09-11 (×2): qty 100

## 2021-09-11 MED ORDER — CEFAZOLIN SODIUM-DEXTROSE 2-4 GM/100ML-% IV SOLN
INTRAVENOUS | Status: AC
  Filled 2021-09-11: qty 100

## 2021-09-11 MED ORDER — PROPOFOL 1000 MG/100ML IV EMUL
INTRAVENOUS | Status: AC
  Filled 2021-09-11: qty 100

## 2021-09-11 MED ORDER — FENOFIBRATE 54 MG PO TABS
54.0000 mg | ORAL_TABLET | Freq: Every day | ORAL | Status: DC
  Administered 2021-09-12: 54 mg via ORAL
  Filled 2021-09-11: qty 1

## 2021-09-11 MED ORDER — METOCLOPRAMIDE HCL 5 MG/ML IJ SOLN: 5.0000 mg | Freq: Three times a day (TID) | INTRAMUSCULAR | Status: DC | PRN

## 2021-09-11 MED ORDER — PHENYLEPHRINE 40 MCG/ML (10ML) SYRINGE FOR IV PUSH (FOR BLOOD PRESSURE SUPPORT)
PREFILLED_SYRINGE | INTRAVENOUS | Status: AC
  Filled 2021-09-11: qty 10

## 2021-09-11 MED ORDER — LACTATED RINGERS IV SOLN
INTRAVENOUS | Status: DC
Start: 2021-09-11 — End: 2021-09-11

## 2021-09-11 MED ORDER — CHLORHEXIDINE GLUCONATE 0.12 % MT SOLN: 15.0000 mL | Freq: Once | OROMUCOSAL | Status: DC

## 2021-09-11 MED ORDER — DEXAMETHASONE SODIUM PHOSPHATE 10 MG/ML IJ SOLN
INTRAMUSCULAR | Status: AC
  Filled 2021-09-11: qty 1

## 2021-09-11 MED ORDER — METHOCARBAMOL 500 MG IVPB - SIMPLE MED
INTRAVENOUS | Status: AC
  Filled 2021-09-11: qty 50

## 2021-09-11 MED ORDER — ALBUTEROL SULFATE (2.5 MG/3ML) 0.083% IN NEBU: 3.0000 mL | INHALATION_SOLUTION | Freq: Four times a day (QID) | RESPIRATORY_TRACT | Status: DC | PRN

## 2021-09-11 MED ORDER — ACETAMINOPHEN 325 MG PO TABS: 325.0000 mg | ORAL_TABLET | Freq: Four times a day (QID) | ORAL | Status: DC | PRN

## 2021-09-11 MED ORDER — FENTANYL CITRATE PF 50 MCG/ML IJ SOSY
50.0000 ug | PREFILLED_SYRINGE | INTRAMUSCULAR | Status: DC
  Administered 2021-09-11: 100 ug via INTRAVENOUS
  Filled 2021-09-11: qty 2

## 2021-09-11 MED ORDER — SERTRALINE HCL 50 MG PO TABS
50.0000 mg | ORAL_TABLET | Freq: Every day | ORAL | Status: DC
  Administered 2021-09-12: 50 mg via ORAL
  Filled 2021-09-11: qty 1

## 2021-09-11 MED ORDER — OXYCODONE HCL 5 MG PO TABS
ORAL_TABLET | ORAL | Status: AC
  Filled 2021-09-11: qty 1

## 2021-09-11 MED ORDER — ASPIRIN 81 MG PO CHEW
81.0000 mg | CHEWABLE_TABLET | Freq: Two times a day (BID) | ORAL | Status: DC
  Administered 2021-09-11 – 2021-09-12 (×2): 81 mg via ORAL
  Filled 2021-09-11 (×2): qty 1

## 2021-09-11 MED ORDER — DOCUSATE SODIUM 100 MG PO CAPS
100.0000 mg | ORAL_CAPSULE | Freq: Two times a day (BID) | ORAL | Status: DC
  Administered 2021-09-11 – 2021-09-12 (×2): 100 mg via ORAL
  Filled 2021-09-11 (×2): qty 1

## 2021-09-11 MED ORDER — OXYBUTYNIN CHLORIDE ER 5 MG PO TB24
10.0000 mg | ORAL_TABLET | Freq: Every morning | ORAL | Status: DC
  Administered 2021-09-12: 10 mg via ORAL
  Filled 2021-09-11: qty 2

## 2021-09-11 MED ORDER — GLIPIZIDE 5 MG PO TABS: 5.0000 mg | ORAL_TABLET | Freq: Every day | ORAL | Status: DC

## 2021-09-11 MED ORDER — STERILE WATER FOR IRRIGATION IR SOLN
Status: DC | PRN
  Administered 2021-09-11: 2000 mL

## 2021-09-11 MED ORDER — BUPIVACAINE-EPINEPHRINE (PF) 0.25% -1:200000 IJ SOLN
INTRAMUSCULAR | Status: AC
  Filled 2021-09-11: qty 30

## 2021-09-11 MED ORDER — DIPHENHYDRAMINE HCL 12.5 MG/5ML PO ELIX: 12.5000 mg | ORAL_SOLUTION | ORAL | Status: DC | PRN

## 2021-09-11 MED ORDER — CEFAZOLIN SODIUM-DEXTROSE 2-4 GM/100ML-% IV SOLN
2.0000 g | INTRAVENOUS | Status: AC
  Administered 2021-09-11: 2 g via INTRAVENOUS

## 2021-09-11 MED ORDER — ONDANSETRON HCL 4 MG PO TABS: 4.0000 mg | ORAL_TABLET | Freq: Four times a day (QID) | ORAL | Status: DC | PRN

## 2021-09-11 MED ORDER — PHENOL 1.4 % MT LIQD: 1.0000 | OROMUCOSAL | Status: DC | PRN

## 2021-09-11 MED ORDER — MIDAZOLAM HCL 2 MG/2ML IJ SOLN
1.0000 mg | INTRAMUSCULAR | Status: DC
  Administered 2021-09-11: 2 mg via INTRAVENOUS
  Filled 2021-09-11: qty 2

## 2021-09-11 MED ORDER — METHOCARBAMOL 500 MG PO TABS
500.0000 mg | ORAL_TABLET | Freq: Four times a day (QID) | ORAL | Status: DC | PRN
  Administered 2021-09-11 – 2021-09-12 (×2): 500 mg via ORAL
  Filled 2021-09-11 (×2): qty 1

## 2021-09-11 MED ORDER — HYDROMORPHONE HCL 1 MG/ML IJ SOLN: 0.2500 mg | INTRAMUSCULAR | Status: DC | PRN

## 2021-09-11 MED ORDER — ORAL CARE MOUTH RINSE: 15.0000 mL | Freq: Once | OROMUCOSAL | Status: AC

## 2021-09-11 MED ORDER — BUPIVACAINE IN DEXTROSE 0.75-8.25 % IT SOLN
INTRATHECAL | Status: DC | PRN
  Administered 2021-09-11: 13.5 mg via INTRATHECAL

## 2021-09-11 MED ORDER — HYDROMORPHONE HCL 1 MG/ML IJ SOLN
INTRAMUSCULAR | Status: AC
  Administered 2021-09-11: 1 mg via INTRAVENOUS
  Filled 2021-09-11: qty 1

## 2021-09-11 MED ORDER — POVIDONE-IODINE 10 % EX SWAB
2.0000 "application " | Freq: Once | CUTANEOUS | Status: AC
  Administered 2021-09-11: 2 via TOPICAL

## 2021-09-11 MED ORDER — METFORMIN HCL ER 500 MG PO TB24: 1500.0000 mg | ORAL_TABLET | Freq: Every day | ORAL | Status: DC

## 2021-09-11 MED ORDER — TOBRAMYCIN SULFATE 1.2 G IJ SOLR
INTRAMUSCULAR | Status: AC
  Filled 2021-09-11: qty 1.2

## 2021-09-11 MED ORDER — FERROUS SULFATE 325 (65 FE) MG PO TABS
325.0000 mg | ORAL_TABLET | Freq: Three times a day (TID) | ORAL | Status: DC
  Administered 2021-09-11 – 2021-09-12 (×3): 325 mg via ORAL
  Filled 2021-09-11 (×3): qty 1

## 2021-09-11 MED ORDER — ORAL CARE MOUTH RINSE: 15.0000 mL | Freq: Once | OROMUCOSAL | Status: DC

## 2021-09-11 MED ORDER — PHENYLEPHRINE 40 MCG/ML (10ML) SYRINGE FOR IV PUSH (FOR BLOOD PRESSURE SUPPORT)
PREFILLED_SYRINGE | INTRAVENOUS | Status: DC | PRN
  Administered 2021-09-11: 120 ug via INTRAVENOUS
  Administered 2021-09-11: 80 ug via INTRAVENOUS
  Administered 2021-09-11: 120 ug via INTRAVENOUS

## 2021-09-11 MED ORDER — METHOCARBAMOL 500 MG IVPB - SIMPLE MED
500.0000 mg | Freq: Four times a day (QID) | INTRAVENOUS | Status: DC | PRN
  Administered 2021-09-11: 500 mg via INTRAVENOUS
  Filled 2021-09-11: qty 50

## 2021-09-11 MED ORDER — SODIUM CHLORIDE 0.9 % IV SOLN: INTRAVENOUS | Status: DC

## 2021-09-11 MED ORDER — POLYETHYLENE GLYCOL 3350 17 G PO PACK: 17.0000 g | PACK | Freq: Every day | ORAL | Status: DC | PRN

## 2021-09-11 MED ORDER — SEMAGLUTIDE 7 MG PO TABS: 7.0000 mg | ORAL_TABLET | Freq: Every day | ORAL | Status: DC

## 2021-09-11 MED ORDER — BISACODYL 10 MG RE SUPP: 10.0000 mg | Freq: Every day | RECTAL | Status: DC | PRN

## 2021-09-11 MED ORDER — CELECOXIB 200 MG PO CAPS
200.0000 mg | ORAL_CAPSULE | Freq: Two times a day (BID) | ORAL | Status: DC
  Administered 2021-09-11 – 2021-09-12 (×2): 200 mg via ORAL
  Filled 2021-09-11 (×2): qty 1

## 2021-09-11 MED ORDER — BUPIVACAINE HCL (PF) 0.5 % IJ SOLN
INTRAMUSCULAR | Status: AC
  Filled 2021-09-11: qty 30

## 2021-09-11 MED ORDER — DEXAMETHASONE SODIUM PHOSPHATE 10 MG/ML IJ SOLN
8.0000 mg | Freq: Once | INTRAMUSCULAR | Status: AC
  Administered 2021-09-11: 8 mg via INTRAVENOUS

## 2021-09-11 MED ORDER — SODIUM CHLORIDE (PF) 0.9 % IJ SOLN
INTRAMUSCULAR | Status: DC | PRN
  Administered 2021-09-11: 30 mL

## 2021-09-11 MED ORDER — 0.9 % SODIUM CHLORIDE (POUR BTL) OPTIME
TOPICAL | Status: DC | PRN
  Administered 2021-09-11: 1000 mL

## 2021-09-11 MED ORDER — METOCLOPRAMIDE HCL 5 MG PO TABS: 5.0000 mg | ORAL_TABLET | Freq: Three times a day (TID) | ORAL | Status: DC | PRN

## 2021-09-11 MED ORDER — KETOROLAC TROMETHAMINE 30 MG/ML IJ SOLN
INTRAMUSCULAR | Status: DC | PRN
  Administered 2021-09-11: 30 mg

## 2021-09-11 MED ORDER — LACTATED RINGERS IV SOLN: INTRAVENOUS | Status: DC

## 2021-09-11 MED ORDER — TRANEXAMIC ACID-NACL 1000-0.7 MG/100ML-% IV SOLN
1000.0000 mg | INTRAVENOUS | Status: AC
  Administered 2021-09-11: 1000 mg via INTRAVENOUS

## 2021-09-11 MED ORDER — PROPOFOL 500 MG/50ML IV EMUL
INTRAVENOUS | Status: DC | PRN
  Administered 2021-09-11: 100 ug/kg/min via INTRAVENOUS

## 2021-09-11 MED ORDER — TRANEXAMIC ACID-NACL 1000-0.7 MG/100ML-% IV SOLN
1000.0000 mg | Freq: Once | INTRAVENOUS | Status: AC
  Administered 2021-09-11: 1000 mg via INTRAVENOUS
  Filled 2021-09-11: qty 100

## 2021-09-11 MED ORDER — CHLORHEXIDINE GLUCONATE 0.12 % MT SOLN
15.0000 mL | Freq: Once | OROMUCOSAL | Status: AC
  Administered 2021-09-11: 15 mL via OROMUCOSAL

## 2021-09-11 SURGICAL SUPPLY — 56 items
ADH SKN CLS APL DERMABOND .7 (GAUZE/BANDAGES/DRESSINGS) ×1
ATTUNE MED ANAT PAT 35 KNEE (Knees) ×2 IMPLANT
ATTUNE PS FEM RT SZ 4 CEM KNEE (Femur) ×2 IMPLANT
ATTUNE PSRP INSR SZ4 7 KNEE (Insert) ×2 IMPLANT
BAG COUNTER SPONGE SURGICOUNT (BAG) ×2 IMPLANT
BAG SPEC THK2 15X12 ZIP CLS (MISCELLANEOUS)
BAG SPNG CNTER NS LX DISP (BAG) ×1
BAG ZIPLOCK 12X15 (MISCELLANEOUS) IMPLANT
BASE TIBIAL ROT PLAT SZ 5 KNEE (Knees) ×1 IMPLANT
BLADE SAW SGTL 11.0X1.19X90.0M (BLADE) IMPLANT
BLADE SAW SGTL 13.0X1.19X90.0M (BLADE) ×2 IMPLANT
BLADE SURG SZ10 CARB STEEL (BLADE) ×4 IMPLANT
BNDG ELASTIC 6X5.8 VLCR STR LF (GAUZE/BANDAGES/DRESSINGS) ×2 IMPLANT
BOWL SMART MIX CTS (DISPOSABLE) ×2 IMPLANT
BSPLAT TIB 5 CMNT ROT PLAT STR (Knees) ×1 IMPLANT
CEMENT HV SMART SET (Cement) ×4 IMPLANT
CUFF TOURN SGL QUICK 34 (TOURNIQUET CUFF) ×2
CUFF TRNQT CYL 34X4.125X (TOURNIQUET CUFF) ×1 IMPLANT
DECANTER SPIKE VIAL GLASS SM (MISCELLANEOUS) ×4 IMPLANT
DERMABOND ADVANCED (GAUZE/BANDAGES/DRESSINGS) ×1
DERMABOND ADVANCED .7 DNX12 (GAUZE/BANDAGES/DRESSINGS) ×1 IMPLANT
DRAPE INCISE IOBAN 66X45 STRL (DRAPES) ×6 IMPLANT
DRAPE U-SHAPE 47X51 STRL (DRAPES) ×2 IMPLANT
DRESSING AQUACEL AG SP 3.5X10 (GAUZE/BANDAGES/DRESSINGS) ×1 IMPLANT
DRSG AQUACEL AG SP 3.5X10 (GAUZE/BANDAGES/DRESSINGS) ×2
DURAPREP 26ML APPLICATOR (WOUND CARE) ×4 IMPLANT
ELECT REM PT RETURN 15FT ADLT (MISCELLANEOUS) ×2 IMPLANT
GLOVE SURG ENC MOIS LTX SZ6 (GLOVE) IMPLANT
GLOVE SURG UNDER LTX SZ7.5 (GLOVE) ×2 IMPLANT
GLOVE SURG UNDER POLY LF SZ6.5 (GLOVE) IMPLANT
GLOVE SURG UNDER POLY LF SZ7.5 (GLOVE) ×2 IMPLANT
GOWN STRL REUS W/TWL LRG LVL3 (GOWN DISPOSABLE) ×2 IMPLANT
HANDPIECE INTERPULSE COAX TIP (DISPOSABLE) ×2
HOLDER FOLEY CATH W/STRAP (MISCELLANEOUS) IMPLANT
KIT TURNOVER KIT A (KITS) ×2 IMPLANT
MANIFOLD NEPTUNE II (INSTRUMENTS) ×2 IMPLANT
NDL SAFETY ECLIPSE 18X1.5 (NEEDLE) IMPLANT
NEEDLE HYPO 18GX1.5 SHARP (NEEDLE)
NS IRRIG 1000ML POUR BTL (IV SOLUTION) ×2 IMPLANT
PACK TOTAL KNEE CUSTOM (KITS) ×2 IMPLANT
PIN DRILL FIX HALF THREAD (BIT) ×2 IMPLANT
PIN FIX SIGMA LCS THRD HI (PIN) ×2 IMPLANT
PROTECTOR NERVE ULNAR (MISCELLANEOUS) ×2 IMPLANT
SET HNDPC FAN SPRY TIP SCT (DISPOSABLE) ×1 IMPLANT
SET PAD KNEE POSITIONER (MISCELLANEOUS) ×2 IMPLANT
SUT MNCRL AB 4-0 PS2 18 (SUTURE) ×2 IMPLANT
SUT STRATAFIX PDS+ 0 24IN (SUTURE) ×2 IMPLANT
SUT VIC AB 1 CT1 36 (SUTURE) ×2 IMPLANT
SUT VIC AB 2-0 CT1 27 (SUTURE) ×6
SUT VIC AB 2-0 CT1 TAPERPNT 27 (SUTURE) ×3 IMPLANT
SYR 3ML LL SCALE MARK (SYRINGE) ×2 IMPLANT
TIBIAL BASE ROT PLAT SZ 5 KNEE (Knees) ×2 IMPLANT
TRAY FOLEY MTR SLVR 16FR STAT (SET/KITS/TRAYS/PACK) ×2 IMPLANT
TUBE SUCTION HIGH CAP CLEAR NV (SUCTIONS) ×2 IMPLANT
WATER STERILE IRR 1000ML POUR (IV SOLUTION) ×4 IMPLANT
WRAP KNEE MAXI GEL POST OP (GAUZE/BANDAGES/DRESSINGS) ×2 IMPLANT

## 2021-09-11 NOTE — Anesthesia Procedure Notes (Signed)
Spinal  Start time: 09/11/2021 8:55 AM End time: 09/11/2021 9:10 AM Staffing Performed: anesthesiologist  Anesthesiologist: Barnet Glasgow, MD Preanesthetic Checklist Completed: patient identified, IV checked, site marked, risks and benefits discussed, surgical consent, monitors and equipment checked, pre-op evaluation and timeout performed Spinal Block Patient position: sitting Prep: ChloraPrep Patient monitoring: heart rate, cardiac monitor, continuous pulse ox and blood pressure Location: L3-4 Injection technique: single-shot Needle Needle type: Introducer  Needle gauge: 24 G Assessment Sensory level: T10 Additional Notes Dr. Valma Cava successful on third attempt. Previous attempt by CRNA and Dr. Nyoka Cowden

## 2021-09-11 NOTE — Progress Notes (Signed)
AssistedDr. Charlene Green with right, ultrasound guided, adductor canal block. Side rails up, monitors on throughout procedure. See vital signs in flow sheet. Tolerated Procedure well. ? ?

## 2021-09-11 NOTE — Plan of Care (Signed)
  Problem: Clinical Measurements: Goal: Ability to maintain clinical measurements within normal limits will improve Outcome: Progressing   Problem: Clinical Measurements: Goal: Will remain free from infection Outcome: Progressing   Problem: Pain Managment: Goal: General experience of comfort will improve Outcome: Progressing   

## 2021-09-11 NOTE — Anesthesia Postprocedure Evaluation (Signed)
Anesthesia Post Note  Patient: Veronica Rogers  Procedure(s) Performed: TOTAL KNEE ARTHROPLASTY (Right: Knee)     Patient location during evaluation: PACU Anesthesia Type: Spinal Level of consciousness: awake Pain management: pain level controlled Vital Signs Assessment: post-procedure vital signs reviewed and stable Respiratory status: spontaneous breathing Cardiovascular status: stable Postop Assessment: spinal receding Anesthetic complications: no   No notable events documented.  Last Vitals:  Vitals:   09/11/21 1300 09/11/21 1614  BP: 137/72 127/72  Pulse: 67 66  Resp: 15 16  Temp:  36.9 C  SpO2: 100% 97%    Last Pain:  Vitals:   09/11/21 1614  TempSrc: Oral  PainSc: 1                  Jaison Petraglia

## 2021-09-11 NOTE — Evaluation (Signed)
Physical Therapy Evaluation Patient Details Name: Veronica Rogers MRN: [id number] DOB: [birth year] Today's Date: 09/11/2021  History of Present Illness  Patient is 60 y.o. female s/p Rt TKA on 09/11/21 with PMH significant for OA, breast cancer, DM, GERD, HLD.   Clinical Impression  Veronica Rogers is a 60 y.o. female POD 0 s/p Rt TKA. Patient reports independence with mobility at baseline. Patient is now limited by functional impairments (see PT problem list below) and requires min assist for transfers and gait with RW. Patient was able to ambulate ~65 feet with RW and min assist. Patient instructed in exercise to facilitate circulation to manage edema and reduce risk of DVT. Patient will benefit from continued skilled PT interventions to address impairments and progress towards PLOF. Acute PT will follow to progress mobility and stair training in preparation for safe discharge home.        Recommendations for follow up therapy are one component of a multi-disciplinary discharge planning process, led by the attending physician.  Recommendations may be updated based on patient status, additional functional criteria and insurance authorization.  Follow Up Recommendations Follow surgeon's recommendation for DC plan and follow-up therapies    Equipment Recommendations  Rolling walker with 5" wheels    Recommendations for Other Services       Precautions / Restrictions Precautions Precautions: Fall Restrictions Weight Bearing Restrictions: No Other Position/Activity Restrictions: WBAT      Mobility  Bed Mobility Overal bed mobility: Needs Assistance Bed Mobility: Supine to Sit     Supine to sit: Min assist;HOB elevated     General bed mobility comments: cues for use of bed rail and pt using Lt LE to assist Rt Le off EOB. gaurding for safety.    Transfers Overall transfer level: Needs assistance Equipment used: Rolling walker (2 wheeled) Transfers: Sit to/from Stand Sit to  Stand: Min assist         General transfer comment: cues for hand placement and assist to complete power up to RW.  Ambulation/Gait Ambulation/Gait assistance: Min assist;Min guard Gait Distance (Feet): 65 Feet Assistive device: Rolling walker (2 wheeled) Gait Pattern/deviations: Step-to pattern;Decreased stride length;Decreased weight shift to right Gait velocity: decr   General Gait Details: cues for safe step to pattern and proximity to RW, no buckling or LOB noted. min assist for walker mangement progressing to min guard throughout.  Stairs            Wheelchair Mobility    Modified Rankin (Stroke Patients Only)       Balance Overall balance assessment: Needs assistance Sitting-balance support: Feet supported Sitting balance-Leahy Scale: Good     Standing balance support: During functional activity;Bilateral upper extremity supported Standing balance-Leahy Scale: Fair                               Pertinent Vitals/Pain Pain Assessment: 0-10 Pain Score: 4  Pain Location: Rt knee Pain Descriptors / Indicators: Aching;Discomfort Pain Intervention(s): Limited activity within patient's tolerance;Monitored during session;Repositioned;Patient requesting pain meds-RN notified;Ice applied    Home Living Family/patient expects to be discharged to:: Private residence Living Arrangements: Spouse/significant other Available Help at Discharge: Family Type of Home: House Home Access: Stairs to enter Entrance Stairs-Rails: None Entrance Stairs-Number of Steps: 1 Home Layout: Multi-level Home Equipment: Environmental consultant - 2 wheels;Grab bars - tub/shower      Prior Function Level of Independence: Independent  Hand Dominance   Dominant Hand: Right    Extremity/Trunk Assessment   Upper Extremity Assessment Upper Extremity Assessment: Overall WFL for tasks assessed    Lower Extremity Assessment Lower Extremity Assessment: Overall WFL for  tasks assessed;RLE deficits/detail RLE Deficits / Details: good quad acitvation, no extensor lag with SLR RLE Sensation: WNL RLE Coordination: WNL    Cervical / Trunk Assessment Cervical / Trunk Assessment: Normal  Communication   Communication: No difficulties  Cognition Arousal/Alertness: Awake/alert Behavior During Therapy: WFL for tasks assessed/performed Overall Cognitive Status: Within Functional Limits for tasks assessed                                        General Comments      Exercises Total Joint Exercises Ankle Circles/Pumps: AROM;Both;20 reps;Seated   Assessment/Plan    PT Assessment Patient needs continued PT services  PT Problem List Decreased strength;Decreased range of motion;Decreased activity tolerance;Decreased balance;Decreased mobility;Decreased knowledge of use of DME;Decreased knowledge of precautions;Pain       PT Treatment Interventions DME instruction;Gait training;Stair training;Functional mobility training;Therapeutic activities;Therapeutic exercise;Balance training;Patient/family education    PT Goals (Current goals can be found in the Care Plan section)  Acute Rehab PT Goals Patient Stated Goal: get back to walking and exercising PT Goal Formulation: With patient Time For Goal Achievement: 09/18/21 Potential to Achieve Goals: Good    Frequency 7X/week   Barriers to discharge        Co-evaluation               AM-PAC PT "6 Clicks" Mobility  Outcome Measure Help needed turning from your back to your side while in a flat bed without using bedrails?: A Little Help needed moving from lying on your back to sitting on the side of a flat bed without using bedrails?: A Little Help needed moving to and from a bed to a chair (including a wheelchair)?: A Little Help needed standing up from a chair using your arms (e.g., wheelchair or bedside chair)?: A Little Help needed to walk in hospital room?: A Little Help needed  climbing 3-5 steps with a railing? : A Little 6 Click Score: 18    End of Session Equipment Utilized During Treatment: Gait belt Activity Tolerance: Patient tolerated treatment well Patient left: in chair;with call bell/phone within reach;with chair alarm set;with family/visitor present Nurse Communication: Mobility status;Patient requests pain meds PT Visit Diagnosis: Muscle weakness (generalized) (M62.81);Difficulty in walking, not elsewhere classified (R26.2)    Time: [id number] PT Time Calculation (min) (ACUTE ONLY): 35 min   Charges:   PT Evaluation $PT Eval Low Complexity: 1 Low PT Treatments $Gait Training: 8-22 mins        Verner Mould, DPT Acute Rehabilitation Services Office [phone] Pager [phone]   Jacques Navy 09/11/2021, 6:54 PM

## 2021-09-11 NOTE — Interval H&P Note (Signed)
History and Physical Interval Note:  09/11/2021 7:02 AM  Veronica Rogers  has presented today for surgery, with the diagnosis of right knee osteoarthritis.  The various methods of treatment have been discussed with the patient and family. After consideration of risks, benefits and other options for treatment, the patient has consented to  Procedure(s): TOTAL KNEE ARTHROPLASTY (Right) as a surgical intervention.  The patient's history has been reviewed, patient examined, no change in status, stable for surgery.  I have reviewed the patient's chart and labs.  Questions were answered to the patient's satisfaction.     Mauri Pole

## 2021-09-11 NOTE — Anesthesia Procedure Notes (Signed)
Anesthesia Regional Block: Adductor canal block   Pre-Anesthetic Checklist: , timeout performed,  Correct Patient, Correct Site, Correct Laterality,  Correct Procedure, Correct Position, site marked,  Risks and benefits discussed,  Surgical consent,  Pre-op evaluation,  At surgeon's request  Laterality: Right  Prep: chloraprep       Needles:   Needle Type: Stimulator Needle - 80          Additional Needles:   Procedures: Doppler guided,,,, ultrasound used (permanent image in chart),,    Narrative:  Start time: 09/11/2021 8:05 AM End time: 09/11/2021 8:20 AM Injection made incrementally with aspirations every 5 mL.  Performed by: Personally  Anesthesiologist: Belinda Block, MD

## 2021-09-11 NOTE — Discharge Instructions (Signed)

## 2021-09-11 NOTE — Op Note (Signed)
NAME:  Veronica Rogers                      MEDICAL RECORD NO.:  [id number]                             FACILITY:  [facility]      PHYSICIAN:  Veronica Rogers, M.D.  DATE OF BIRTH:  Nov 01, 1961      DATE OF PROCEDURE:  09/11/2021                                     OPERATIVE REPORT         PREOPERATIVE DIAGNOSIS:  Right knee osteoarthritis.      POSTOPERATIVE DIAGNOSIS:  Right knee osteoarthritis.      FINDINGS:  The patient was noted to have complete loss of cartilage and   bone-on-bone arthritis with associated osteophytes in the lateral and patellofemoral compartments of   the knee with a significant synovitis and associated effusion.  The patient had failed months of conservative treatment including medications, injection therapy, activity modification.     PROCEDURE:  Right total knee replacement.      COMPONENTS USED:  DePuy Attune rotating platform posterior stabilized knee   system, a size 4 femur, 5 tibia, size 7 mm PS AOX insert, and 35 anatomic patellar   button.      SURGEON:  Veronica Rogers, M.D.      ASSISTANT:  Purcell Nails, PA-C.      ANESTHESIA:  Regional and Spinal.      SPECIMENS:  None.      COMPLICATION:  None.      DRAINS:  None.  EBL: <100 cc      TOURNIQUET TIME:   Total Tourniquet Time Documented: Thigh (Right) - 37 minutes Total: Thigh (Right) - 37 minutes  .      The patient was stable to the recovery room.      INDICATION FOR PROCEDURE:  Veronica Rogers is a 60 y.o. female patient of   mine.  The patient had been seen, evaluated, and treated for months conservatively in the   office with medication, activity modification, and injections.  The patient had   radiographic changes of bone-on-bone arthritis with endplate sclerosis and osteophytes noted.  Based on the radiographic changes and failed conservative measures, the patient   decided to proceed with definitive treatment, total knee replacement.  Risks of infection, DVT, component  failure, need for revision surgery, neurovascular injury were reviewed in the office setting.  The postop course was reviewed stressing the efforts to maximize post-operative satisfaction and function.  Consent was obtained for benefit of pain   relief.      PROCEDURE IN DETAIL:  The patient was brought to the operative theater.   Once adequate anesthesia, preoperative antibiotics, 2 gm of Ancef,1 gm of Tranexamic Acid, and 10 mg of Decadron administered, the patient was positioned supine with a right thigh tourniquet placed.  The  right lower extremity was prepped and draped in sterile fashion.  A time-   out was performed identifying the patient, planned procedure, and the appropriate extremity.      The right lower extremity was placed in the [facility] leg holder.  The leg was   exsanguinated, tourniquet elevated to 250 mmHg.  A midline incision was  made followed by median parapatellar arthrotomy.  Following initial   exposure, attention was first directed to the patella.  Precut   measurement was noted to be 25 mm.  I resected down to 14 mm and used a   35 anatomic patellar button to restore patellar height as well as cover the cut surface.      The lug holes were drilled and a metal shim was placed to protect the   patella from retractors and saw blade during the procedure.      At this point, attention was now directed to the femur.  The femoral   canal was opened with a drill, irrigated to try to prevent fat emboli.  An   intramedullary rod was passed at 3 degrees valgus, 12 mm of bone was   resected off the distal femur.  Following this resection, the tibia was   subluxated anteriorly.  Using the extramedullary guide, 3 mm of bone was resected off   the proximal lateral tibia.  We confirmed the gap would be   stable medially and laterally with a size 5 spacer block as well as confirmed that the tibial cut was perpendicular in the coronal plane, checking with an alignment rod.       Once this was done, I sized the femur to be a size 4 in the anterior-   posterior dimension, chose a standard component based on medial and   lateral dimension.  The size 4 rotation block was then pinned in   position anterior referenced using the C-clamp to set rotation.  The   anterior, posterior, and  chamfer cuts were made without difficulty nor   notching making certain that I was along the anterior cortex to help   with flexion gap stability.      The final box cut was made off the lateral aspect of distal femur.      At this point, the tibia was sized to be a size 5.  The size 5 tray was   then pinned in position through the medial third of the tubercle,   drilled, and keel punched.  Trial reduction was now carried with a 4 femur,  5 tibia, a size 7 mm PS insert, and the 35 anatomic patella botton.  The knee was brought to full extension with good flexion stability with the patella   tracking through the trochlea without application of pressure.  Given   all these findings the trial components removed.  Final components were   opened and cement was mixed.  The knee was irrigated with normal saline solution and pulse lavage.  The synovial lining was   then injected with 30 cc of 0.25% Marcaine with epinephrine, 1 cc of Toradol and 30 cc of NS for a total of 61 cc.     Final implants were then cemented onto cleaned and dried cut surfaces of bone with the knee brought to extension with a size 7 mm PS trial insert.      Once the cement had fully cured, excess cement was removed   throughout the knee.  I confirmed that I was satisfied with the range of   motion and stability, and the final size 7 mm PS AOX insert was chosen.  It was   placed into the knee.      The tourniquet had been let down at 37 minutes.  No significant   hemostasis was required.  The extensor mechanism was then reapproximated using #1 Vicryl  and #1 Stratafix sutures with the knee   in flexion.  The   remaining  wound was closed with 2-0 Vicryl and running 4-0 Monocryl.   The knee was cleaned, dried, dressed sterilely using Dermabond and   Aquacel dressing.  The patient was then   brought to recovery room in stable condition, tolerating the procedure   well.   Please note that Physician Assistant, Costella Hatcher, PA-C was present for the entirety of the case, and was utilized for pre-operative positioning, peri-operative retractor management, general facilitation of the procedure and for primary wound closure at the end of the case.              Veronica Cassis Alvan Rogers, M.D.    09/11/2021 10:35 AM

## 2021-09-11 NOTE — Transfer of Care (Signed)
Immediate Anesthesia Transfer of Care Note  Patient: Veronica Rogers  Procedure(s) Performed: TOTAL KNEE ARTHROPLASTY (Right: Knee)  Patient Location: PACU  Anesthesia Type:Spinal  Level of Consciousness: awake, alert  and oriented  Airway & Oxygen Therapy: Patient Spontanous Breathing and Patient connected to face mask oxygen  Post-op Assessment: Report given to RN and Post -op Vital signs reviewed and stable  Post vital signs: Reviewed and stable  Last Vitals:  Vitals Value Taken Time  BP 126/98 09/11/21 1222  Temp 36.6 C 09/11/21 1145  Pulse 77 09/11/21 1235  Resp 16 09/11/21 1235  SpO2 97 % 09/11/21 1235  Vitals shown include unvalidated device data.  Last Pain:  Vitals:   09/11/21 1145  TempSrc:   PainSc: 0-No pain      Patients Stated Pain Goal: 4 ([id number])  Complications: No notable events documented.

## 2021-09-12 ENCOUNTER — Other Ambulatory Visit (HOSPITAL_COMMUNITY): Payer: Self-pay

## 2021-09-12 ENCOUNTER — Other Ambulatory Visit: Payer: Self-pay

## 2021-09-12 ENCOUNTER — Encounter (HOSPITAL_COMMUNITY): Payer: Self-pay | Admitting: Orthopedic Surgery

## 2021-09-12 DIAGNOSIS — M1711 Unilateral primary osteoarthritis, right knee: Secondary | ICD-10-CM | POA: Diagnosis not present

## 2021-09-12 LAB — BASIC METABOLIC PANEL
Anion gap: 7 (ref 5–15)
BUN: 17 mg/dL (ref 6–20)
CO2: 28 mmol/L (ref 22–32)
Calcium: 9.4 mg/dL (ref 8.9–10.3)
Chloride: 107 mmol/L (ref 98–111)
Creatinine, Ser: 0.74 mg/dL (ref 0.44–1.00)
GFR, Estimated: >60 mL/min (ref >60)
Glucose, Bld: 173 mg/dL — ABNORMAL HIGH (ref 70–99)
Potassium: 4.3 mmol/L (ref 3.5–5.1)
Sodium: 142 mmol/L (ref 135–145)

## 2021-09-12 LAB — CBC
HCT: 27.8 % — ABNORMAL LOW (ref 36.0–46.0)
Hemoglobin: 8.7 g/dL — ABNORMAL LOW (ref 12.0–15.0)
MCH: 26.9 pg (ref 26.0–34.0)
MCHC: 31.3 g/dL (ref 30.0–36.0)
MCV: 85.8 fL (ref 80.0–100.0)
Platelets: 189 10*3/uL (ref 150–400)
RBC: 3.24 MIL/uL — ABNORMAL LOW (ref 3.87–5.11)
RDW: 13.9 % (ref 11.5–15.5)
WBC: 10.9 10*3/uL — ABNORMAL HIGH (ref 4.0–10.5)
nRBC: 0 % (ref 0.0–0.2)

## 2021-09-12 MED ORDER — CELECOXIB 200 MG PO CAPS
200.0000 mg | ORAL_CAPSULE | Freq: Two times a day (BID) | ORAL | 0 refills | Status: DC
  Filled 2021-09-12 (×2): qty 60, 30d supply, fill #0

## 2021-09-12 MED ORDER — RIVAROXABAN 10 MG PO TABS
10.0000 mg | ORAL_TABLET | Freq: Every day | ORAL | 0 refills | Status: DC
  Filled 2021-09-12 (×2): qty 14, 14d supply, fill #0

## 2021-09-12 MED ORDER — OXYCODONE HCL 5 MG PO TABS
5.0000 mg | ORAL_TABLET | ORAL | 0 refills | Status: DC | PRN
  Filled 2021-09-12: qty 56, 5d supply, fill #0

## 2021-09-12 MED ORDER — POLYETHYLENE GLYCOL 3350 17 G PO PACK
17.0000 g | PACK | Freq: Every day | ORAL | 0 refills | Status: DC | PRN
  Filled 2021-09-12: qty 14, 14d supply, fill #0

## 2021-09-12 MED ORDER — DOCUSATE SODIUM 100 MG PO CAPS
100.0000 mg | ORAL_CAPSULE | Freq: Two times a day (BID) | ORAL | 0 refills | Status: DC
  Filled 2021-09-12: qty 10, 5d supply, fill #0

## 2021-09-12 MED ORDER — HYDROCORT-PRAMOXINE (PERIANAL) 2.5-1 % EX CREA
TOPICAL_CREAM | CUTANEOUS | 3 refills | Status: DC
  Filled 2021-09-12 (×2): qty 30, 10d supply, fill #0

## 2021-09-12 MED ORDER — METHOCARBAMOL 500 MG PO TABS
500.0000 mg | ORAL_TABLET | Freq: Four times a day (QID) | ORAL | 0 refills | Status: DC | PRN
  Filled 2021-09-12 (×2): qty 40, 10d supply, fill #0

## 2021-09-12 NOTE — Progress Notes (Signed)
   Subjective: 1 Day Post-Op Procedure(s) (LRB): TOTAL KNEE ARTHROPLASTY (Right) Patient reports pain as mild.   Patient seen in rounds by Dr. Alvan Dame. Patient is well, and has had no acute complaints or problems. No acute events overnight. Foley catheter removed. Patient ambulated 65 feet with PT.  We will continue therapy today.   Objective: Vital signs in last 24 hours: Temp:  [97.5 F (36.4 C)-98.7 F (37.1 C)] 98 F (36.7 C) (09/14 0629) Pulse Rate:  [62-84] 68 (09/14 0629) Resp:  [13-21] 16 (09/14 0629) BP: (109-164)/(58-90) 120/69 (09/14 0629) SpO2:  [92 %-100 %] 94 % (09/14 0629)  Intake/Output from previous day:  Intake/Output Summary (Last 24 hours) at 09/12/2021 0722 Last data filed at 09/12/2021 0551 Gross per 24 hour  Intake 2638.1 ml  Output 1050 ml  Net 1588.1 ml     Intake/Output this shift: No intake/output data recorded.  Labs: Recent Labs    09/12/21 0255  HGB 8.7*   Recent Labs    09/12/21 0255  WBC 10.9*  RBC 3.24*  HCT 27.8*  PLT 189   Recent Labs    09/12/21 0255  NA 142  K 4.3  CL 107  CO2 28  BUN 17  CREATININE 0.74  GLUCOSE 173*  CALCIUM 9.4   No results for input(s): LABPT, INR in the last 72 hours.  Exam: General - Patient is Alert and Oriented Extremity - Neurologically intact Sensation intact distally Intact pulses distally Dorsiflexion/Plantar flexion intact Dressing - dressing C/D/I Motor Function - intact, moving foot and toes well on exam.   Past Medical History:  Diagnosis Date   Arthritis    Breast cancer ([place])    Contact lens/glasses fitting    wears contacts or glasses   Diabetes ([place]) 11/22/2014   GERD (gastroesophageal reflux disease)    Hyperlipidemia    Personal history of chemotherapy    Personal history of radiation therapy    Radiation 03/28/14-05/06/14   Left Breast 60 Gy    Assessment/Plan: 1 Day Post-Op Procedure(s) (LRB): TOTAL KNEE ARTHROPLASTY (Right) Active Problems:   S/P total knee  arthroplasty, right  Estimated body mass index is 29.16 kg/m as calculated from the following:   Height as of this encounter: '5\' 7"'$  (1.702 m).   Weight as of this encounter: 84.5 kg. Advance diet Up with therapy D/C IV fluids   Patient's anticipated LOS is less than 2 midnights, meeting these requirements: - Younger than 2 - Lives within 1 hour of care - Has a competent adult at home to recover with post-op recover - NO history of  - Chronic pain requiring opiods  - Diabetes  - Coronary Artery Disease  - Heart failure  - Heart attack  - Stroke  - DVT/VTE  - Cardiac arrhythmia  - Respiratory Failure/COPD  - Renal failure  - Anemia  - Advanced Liver disease     DVT Prophylaxis - Aspirin Weight bearing as tolerated.  Plan is to go Home after hospital stay. Plan for discharge today following 1-2 sessions of PT as long as they are meeting their goals. Patient is scheduled for OPPT. Follow up in the office in 2 weeks.   Griffith Citron, PA-C Orthopedic Surgery [phone] 09/12/2021, 7:22 AM

## 2021-09-12 NOTE — Progress Notes (Signed)
Physical Therapy Treatment Patient Details Name: Veronica Rogers MRN: [id number] DOB: 11/17/1961 Today's Date: 09/12/2021   History of Present Illness Patient is 60 y.o. female s/p Rt TKA on 09/11/21 with PMH significant for OA, breast cancer, DM, GERD, HLD.    PT Comments    Patient seen for additional session to review stairs with husband present to ensure safe discharge home. Pt/spouse demonstrated good communication and safe sequencing for stairs and gait. She is at safe level for discharge home with assist from family. Acute PT will continue to progress as able.      Recommendations for follow up therapy are one component of a multi-disciplinary discharge planning process, led by the attending physician.  Recommendations may be updated based on patient status, additional functional criteria and insurance authorization.  Follow Up Recommendations  Follow surgeon's recommendation for DC plan and follow-up therapies     Equipment Recommendations  Rolling walker with 5" wheels    Recommendations for Other Services       Precautions / Restrictions Precautions Precautions: Fall Restrictions Weight Bearing Restrictions: No Other Position/Activity Restrictions: WBAT     Mobility  Bed Mobility               General bed mobility comments: pt OOB in recliner    Transfers Overall transfer level: Needs assistance Equipment used: Rolling walker (2 wheeled) Transfers: Sit to/from Stand Sit to Stand: Supervision         General transfer comment: supervision for safety, no assist needed for power up, steady with rise. pt using bil UE for power up.  Ambulation/Gait Ambulation/Gait assistance: Supervision Gait Distance (Feet): 30 Feet Assistive device: Rolling walker (2 wheeled) Gait Pattern/deviations: Step-to pattern;Decreased stride length;Decreased weight shift to right Gait velocity: decr   General Gait Details: no cues needed, pt maintained safe step pattern  and walker management throughout. no overt LOB. Pt's husband provided guarding with cues and supervision from therapist.   Stairs   Stairs assistance: Min guard;Supervision Stair Management: No rails;Step to pattern;Backwards;With walker Number of Stairs: 2 General stair comments: pt completed reverse step up technique with 2 steps and min guard/assist from husband with sueprvision from therapist. no LOB noted and pt/spouse safe with sequence throughout.   Wheelchair Mobility    Modified Rankin (Stroke Patients Only)       Balance Overall balance assessment: Needs assistance Sitting-balance support: Feet supported Sitting balance-Leahy Scale: Good     Standing balance support: During functional activity;Bilateral upper extremity supported Standing balance-Leahy Scale: Fair                              Cognition Arousal/Alertness: Awake/alert Behavior During Therapy: WFL for tasks assessed/performed Overall Cognitive Status: Within Functional Limits for tasks assessed                                        Exercises      General Comments        Pertinent Vitals/Pain Pain Assessment: 0-10 Pain Score: 5  Pain Location: Rt knee Pain Descriptors / Indicators: Aching;Discomfort Pain Intervention(s): Limited activity within patient's tolerance;Monitored during session;Repositioned;Patient requesting pain meds-RN notified    Home Living                      Prior Function  PT Goals (current goals can now be found in the care plan section) Acute Rehab PT Goals Patient Stated Goal: get back to walking and exercising PT Goal Formulation: With patient Time For Goal Achievement: 09/18/21 Potential to Achieve Goals: Good Progress towards PT goals: Progressing toward goals    Frequency    7X/week      PT Plan Current plan remains appropriate    Co-evaluation              AM-PAC PT "6 Clicks" Mobility    Outcome Measure  Help needed turning from your back to your side while in a flat bed without using bedrails?: A Little Help needed moving from lying on your back to sitting on the side of a flat bed without using bedrails?: A Little Help needed moving to and from a bed to a chair (including a wheelchair)?: A Little Help needed standing up from a chair using your arms (e.g., wheelchair or bedside chair)?: A Little Help needed to walk in hospital room?: A Little Help needed climbing 3-5 steps with a railing? : A Little 6 Click Score: 18    End of Session Equipment Utilized During Treatment: Gait belt Activity Tolerance: Patient tolerated treatment well Patient left: in chair;with call bell/phone within reach;with chair alarm set;with family/visitor present Nurse Communication: Mobility status;Patient requests pain meds PT Visit Diagnosis: Muscle weakness (generalized) (M62.81);Difficulty in walking, not elsewhere classified (R26.2)     Time: [id number] PT Time Calculation (min) (ACUTE ONLY): 12 min  Charges:  $Gait Training: 8-22 mins                     Verner Mould, DPT Acute Rehabilitation Services Office [phone] Pager [phone]    Jacques Navy 09/12/2021, 12:14 PM

## 2021-09-12 NOTE — Progress Notes (Signed)
Discharge package printed and given to pt. Questions were answered and patient verbalizes understanding.

## 2021-09-12 NOTE — Progress Notes (Signed)
Physical Therapy Treatment Patient Details Name: Veronica Rogers MRN: [id number] DOB: [birth year] Today's Date: 09/12/2021   History of Present Illness Patient is 60 y.o. female s/p Rt TKA on 09/11/21 with PMH significant for OA, breast cancer, DM, GERD, HLD.    PT Comments    Patient is making excellent progress with acute PT and ambulated ~120' with min guard and RW. Initiated stair training for reverse step up to negotiate car transfer and curb at front door of home. Pt instructed in HEP for ROM and circulation. Will follow up for additional stair training with pt's spouse present prior to discharge home.     Recommendations for follow up therapy are one component of a multi-disciplinary discharge planning process, led by the attending physician.  Recommendations may be updated based on patient status, additional functional criteria and insurance authorization.  Follow Up Recommendations  Follow surgeon's recommendation for DC plan and follow-up therapies     Equipment Recommendations  Rolling walker with 5" wheels    Recommendations for Other Services       Precautions / Restrictions Precautions Precautions: Fall Restrictions Weight Bearing Restrictions: No Other Position/Activity Restrictions: WBAT     Mobility  Bed Mobility Overal bed mobility: Needs Assistance Bed Mobility: Supine to Sit     Supine to sit: HOB elevated;Supervision     General bed mobility comments: extra time and pt using rail and Lt LE to assist Rt. supervision for safety    Transfers Overall transfer level: Needs assistance Equipment used: Rolling walker (2 wheeled) Transfers: Sit to/from Stand Sit to Stand: Min guard;Supervision         General transfer comment: pt with good recall for hand placement with power up, no assist needed to rise from EOB or sit in recliner.  Ambulation/Gait Ambulation/Gait assistance: Min guard Gait Distance (Feet): 120 Feet Assistive device: Rolling  walker (2 wheeled) Gait Pattern/deviations: Step-to pattern;Decreased stride length;Decreased weight shift to right Gait velocity: decr   General Gait Details: cues at start for step to pattern and proximity to RW, pt maintained throughout. attempted step through pattern but pain lower with step to. no overt LOB noted.   Stairs Stairs: Yes Stairs assistance: Min guard Stair Management: No rails;Step to pattern;Backwards;With walker Number of Stairs: 1 General stair comments: cues for sequencing reverse step up, no overt LOB noted and pt verbalized understanding of family assist to be provided.   Wheelchair Mobility    Modified Rankin (Stroke Patients Only)       Balance Overall balance assessment: Needs assistance Sitting-balance support: Feet supported Sitting balance-Leahy Scale: Good     Standing balance support: During functional activity;Bilateral upper extremity supported Standing balance-Leahy Scale: Fair                              Cognition Arousal/Alertness: Awake/alert Behavior During Therapy: WFL for tasks assessed/performed Overall Cognitive Status: Within Functional Limits for tasks assessed                                        Exercises Total Joint Exercises Ankle Circles/Pumps: AROM;Both;20 reps;Seated Quad Sets: AROM;Right;5 reps;Seated Short Arc Quad: AROM;Right;5 reps;Seated Heel Slides: AROM;Right;5 reps;Seated Hip ABduction/ADduction: AROM;Right;5 reps;Seated    General Comments        Pertinent Vitals/Pain Pain Assessment: 0-10 Pain Score: 3  Pain Location: Rt knee Pain Descriptors /  Indicators: Aching;Discomfort Pain Intervention(s): Limited activity within patient's tolerance;Monitored during session;Repositioned;Premedicated before session    Home Living                      Prior Function            PT Goals (current goals can now be found in the care plan section) Acute Rehab PT  Goals Patient Stated Goal: get back to walking and exercising PT Goal Formulation: With patient Time For Goal Achievement: 09/18/21 Potential to Achieve Goals: Good Progress towards PT goals: Progressing toward goals    Frequency    7X/week      PT Plan Current plan remains appropriate    Co-evaluation              AM-PAC PT "6 Clicks" Mobility   Outcome Measure  Help needed turning from your back to your side while in a flat bed without using bedrails?: A Little Help needed moving from lying on your back to sitting on the side of a flat bed without using bedrails?: A Little Help needed moving to and from a bed to a chair (including a wheelchair)?: A Little Help needed standing up from a chair using your arms (e.g., wheelchair or bedside chair)?: A Little Help needed to walk in hospital room?: A Little Help needed climbing 3-5 steps with a railing? : A Little 6 Click Score: 18    End of Session Equipment Utilized During Treatment: Gait belt Activity Tolerance: Patient tolerated treatment well Patient left: in chair;with call bell/phone within reach;with chair alarm set;with family/visitor present Nurse Communication: Mobility status;Patient requests pain meds PT Visit Diagnosis: Muscle weakness (generalized) (M62.81);Difficulty in walking, not elsewhere classified (R26.2)     Time: [id number] PT Time Calculation (min) (ACUTE ONLY): 38 min  Charges:  $Gait Training: 23-37 mins $Therapeutic Exercise: 8-22 mins                     Verner Mould, DPT Acute Rehabilitation Services Office [phone] Pager [phone]    Veronica Rogers 09/12/2021, 11:58 AM

## 2021-09-12 NOTE — Plan of Care (Signed)
  Problem: Activity: Goal: Risk for activity intolerance will decrease Outcome: Progressing   Problem: Pain Managment: Goal: General experience of comfort will improve Outcome: Progressing   Problem: Safety: Goal: Ability to remain free from injury will improve Outcome: Progressing   

## 2021-09-12 NOTE — TOC Transition Note (Signed)
Transition of Care [facility]) - CM/SW Discharge Note   Patient Details  Name: Veronica Rogers MRN: [id number] Date of Birth: 11/04/1961  [facility] CM/SW Contact:  Lennart Pall, LCSW Phone Number: 09/12/2021, 2:41 PM   Clinical Narrative:    Met with pt and confirming she has received rw via [facility].  Plan for OPPT at [facility].  No further TOC needs.   Final next level of care: OP Rehab Barriers to Discharge: No Barriers Identified   Patient Goals and CMS Choice Patient states their goals for this hospitalization and ongoing recovery are:: return home      Discharge Placement                       Discharge Plan and Services                DME Arranged: Walker rolling DME Agency: [facility]                  Social Determinants of Health (SDOH) Interventions     Readmission Risk Interventions No flowsheet data found.

## 2021-09-13 ENCOUNTER — Ambulatory Visit: Payer: No Typology Code available for payment source | Attending: Student

## 2021-09-13 ENCOUNTER — Other Ambulatory Visit: Payer: Self-pay

## 2021-09-13 ENCOUNTER — Other Ambulatory Visit (HOSPITAL_COMMUNITY): Payer: Self-pay

## 2021-09-13 DIAGNOSIS — G8929 Other chronic pain: Secondary | ICD-10-CM | POA: Insufficient documentation

## 2021-09-13 DIAGNOSIS — M25561 Pain in right knee: Secondary | ICD-10-CM | POA: Insufficient documentation

## 2021-09-13 DIAGNOSIS — M6281 Muscle weakness (generalized): Secondary | ICD-10-CM | POA: Insufficient documentation

## 2021-09-13 DIAGNOSIS — M25461 Effusion, right knee: Secondary | ICD-10-CM | POA: Diagnosis present

## 2021-09-13 DIAGNOSIS — M25661 Stiffness of right knee, not elsewhere classified: Secondary | ICD-10-CM | POA: Insufficient documentation

## 2021-09-13 DIAGNOSIS — R2681 Unsteadiness on feet: Secondary | ICD-10-CM | POA: Insufficient documentation

## 2021-09-13 MED ORDER — LIDOCAINE-HYDROCORT (PERIANAL) 3-0.5 % EX CREA
TOPICAL_CREAM | CUTANEOUS | 3 refills | Status: DC
  Filled 2021-09-13: qty 98, 7d supply, fill #0
  Filled 2021-09-19: qty 98, 6d supply, fill #0
  Filled 2021-09-26: qty 84.9, 6d supply, fill #0

## 2021-09-13 NOTE — Therapy (Signed)
[place], [place], [address] Phone: [phone]   Fax:  [phone]  Physical Therapy Evaluation  Patient Details  Name: Veronica Rogers MRN: [id number] Date of Birth: 1961/11/29 Referring Provider (PT): Irving Copas, [place]   Encounter Date: 09/13/2021   PT End of Session - 09/13/21 1414     Visit Number 1    Number of Visits 17    Date for PT Re-Evaluation 11/08/21    Authorization Type [employer]    PT Start Time 1315    PT Stop Time [id number]    PT Time Calculation (min) 50 min    Activity Tolerance Patient tolerated treatment well;Patient limited by pain    Behavior During Therapy [facility] for tasks assessed/performed             Past Medical History:  Diagnosis Date   Arthritis    Breast cancer ([place])    Contact lens/glasses fitting    wears contacts or glasses   Diabetes ([place]) 11/22/2014   GERD (gastroesophageal reflux disease)    Hyperlipidemia    Personal history of chemotherapy    Personal history of radiation therapy    Radiation 03/28/14-05/06/14   Left Breast 60 Gy    Past Surgical History:  Procedure Laterality Date   BREAST BIOPSY  1996   right breast, benign   BREAST LUMPECTOMY WITH NEEDLE LOCALIZATION AND AXILLARY SENTINEL LYMPH NODE BX Left 10/29/2013   Procedure: BREAST LUMPECTOMY WITH NEEDLE LOCALIZATION AND AXILLARY SENTINEL LYMPH NODE BX;  Surgeon: Rolm Bookbinder, MD;  Location: [place];  Service: General;  Laterality: Left;   COLONOSCOPY  05/2011   WNL   TOTAL KNEE ARTHROPLASTY Right 09/11/2021   Procedure: TOTAL KNEE ARTHROPLASTY;  Surgeon: Paralee Cancel, MD;  Location: [facility];  Service: Orthopedics;  Laterality: Right;   WISDOM TOOTH EXTRACTION      There were no vitals filed for this visit.    Subjective Assessment - 09/13/21 1323     Subjective Pt reports primary c/o R knee pain, swelling, and stiffness since her R TKA on 09/11/2021 with Dr.  Paralee Cancel. She also reports that the swelling is also in her R foot. She arrives with the surgical stitches in place and an Ace bandage around the surgical site. She also has a compression stocking on her R lower leg. She also ambulates with a FWW. This was preceded by 4-5 years of R knee pain due to arthritic changes. Her next orthopedic appointment is scheduled for 2 weeks after her surgery. She reports that her stitches are dissolvable. She reports current pain of 5/10, worst pain since surgery of 10/10, and best of 3/10. She is using Oxycodone currently for pain, along wit a muscle relaxer.    Limitations Sitting;Walking;Standing;House hold activities    Patient Stated Goals Be able to walk 5K's without limitation, do housework, work functions    Currently in Pain? Yes    Pain Score 5     Pain Location Knee    Pain Orientation Right    Pain Descriptors / Indicators Aching;Sore;Burning    Pain Type Surgical pain    Pain Onset In the past 7 days    Pain Frequency Intermittent    Aggravating Factors  steps, walking, standing    Pain Relieving Factors pain medication, ice, rest    Effect of Pain on Daily Activities Unable to perform household chores, stairs, work functions  [facility] PT Assessment - 09/13/21 0001       Assessment   Medical Diagnosis Unilateral primary osteoarthritis, right knee (M17.11)    Referring Provider (PT) Irving Copas, PA-C    Onset Date/Surgical Date 09/11/21    Hand Dominance Right    Next MD Visit 09/25/2021      Precautions   Precautions Knee    Precaution Comments progressive ROM      Restrictions   Weight Bearing Restrictions Yes    RLE Weight Bearing Weight bearing as tolerated      Balance Screen   Has the patient fallen in the past 6 months Yes    How many times? 3-4    Has the patient had a decrease in activity level because of a fear of falling?  Yes    Is the patient reluctant to leave their home because of a fear of  falling?  No      Home Ecologist residence    Living Arrangements Spouse/significant other    Available Help at Discharge Family;Friend(s)    Type of [place] Access Level entry    [place] Two level    Alternate Level Stairs-Number of Steps 4    Alternate Level Stairs-Rails Right;Left;Can reach both    [place] - 2 wheels      Prior Function   Level of Independence Independent    Vocation Full time employment    Vocation Requirements Prolonged sitting    Leisure walking 5K's      Cognition   Overall Cognitive Status Within Functional Limits for tasks assessed      Observation/Other Assessments   Observations Pt ambulates with antalgic gait and decreased gait speed with FWW, arrives with ACE bandage in place over incision site, severe swelling apparent at R knee and lower leg    Focus on Therapeutic Outcomes (FOTO)  10%, projected 45% in 19 visits      Functional Tests   Functional tests Other;Gilberto Better      Other:   Other/ Comments Short Arc Quad: able to perform with pain      Other:   Other/Comments Long Arc Quad: Unable to perform      AROM   Right Knee Extension 33    Right Knee Flexion 50    Left Knee Extension 0    Left Knee Flexion 135      PROM   Left Knee Extension -2    Left Knee Flexion 135      Strength   Right Hip Flexion 4/5    Right Hip ABduction 4/5    Left Hip Flexion 4/5    Left Hip ABduction 4/5    Right Knee Flexion 1/5    Right Knee Extension 1/5    Left Knee Flexion 5/5    Left Knee Extension 5/5                        Objective measurements completed on examination: See above findings.       [place] Adult PT Treatment/Exercise - 09/13/21 0001       Knee/Hip Exercises: Stretches   Other Knee/Hip Stretches AAROM knee flexion with strap 2x8 with 10 sec hold      Knee/Hip Exercises: Supine   Quad Sets Strengthening   x5 with 5sec hold   Short Arc Quad Sets  Strengthening   x10 with 2sec hold  PT Education - 09/13/21 1406     Education Details Educated on POC, HEP, and TKA protocol according to [employer].    Person(s) Educated Patient;Other (comment)   Niece   Methods Explanation;Demonstration;Handout    Comprehension Returned demonstration;Verbalized understanding              PT Short Term Goals - 09/13/21 1423       PT SHORT TERM GOAL #1   Title Pt will report understanding and adherence to her HEP in order to promote independence in the management of her primaryt sxs.    Baseline HEP provided at eval    Time 4    Period Weeks    Status New    Target Date 10/11/21      PT SHORT TERM GOAL #2   Title Pt will report 50% decrease in swelling in order to achieve further R knee functional ROM.    Baseline Severe swelling in R knee and lower leg    Time 4    Period Weeks    Status New    Target Date 10/11/21      PT SHORT TERM GOAL #3   Title Pt will complete a long arc quad with no quad lag in order to progress to closed-chain LE strengthening exercises.    Baseline Unable to perform    Time 4    Period Weeks    Status New    Target Date 10/11/21               PT Long Term Goals - 09/13/21 1424       PT LONG TERM GOAL #1   Title Pt will demonstrate the ability to ambulate a 6MWT without an AD or seated rest in order to return to walking 5K's without limitation.    Baseline WBAT, utilizing FWW for gait, antalgic gait pattern    Time 8    Period Weeks    Status New    Target Date 11/08/21      PT LONG TERM GOAL #2   Title Pt will achieve a FOTO score of 45% in order to demonstrate improved functional ability as it relates to her knee sxs.    Baseline 10%    Time 8    Period Weeks    Status New    Target Date 11/08/21      PT LONG TERM GOAL #3   Title Pt will achieve R knee extension AROM of 5-0 degrees in order to achieve a normal gait pattern.     Baseline 33 degrees    Time 8    Period Weeks    Status New    Target Date 11/08/21      PT LONG TERM GOAL #4   Title Pt will achieve R knee flexion AROM of 125 degrees or greater in order to sit upright at work without limitation.    Baseline 50 degrees    Time 8    Period Weeks    Status New    Target Date 11/08/21      PT LONG TERM GOAL #5   Title Pt will achieve R knee flexion and extension MMT of 5/5 in order to progress her independent LE strengthening program.    Baseline 1/5    Time 8    Period Weeks    Status New    Target Date 11/08/21  Plan - 09/13/21 1415     Clinical Impression Statement Pt is a pleasant 60yo F who presents 2 days s/p R knee TKA with Dr. Paralee Cancel on 09/11/2021. Upon assessment, her primary impairments include severely limited R knee flexion and extension AROM, R knee weakness, BIL hip weakness, pain, and R LE swelling. She was instructed on the typical POC and prognosis following this surgery and instructed to ice, compress, and elevate regularly. Pt was also educated on the importance of early ROM in accordance with [facility] TKA protocol. She will benefit from skilled PT to address her primary impairments and return to her prior level of function without limitation.    Personal Factors and Comorbidities Comorbidity 3+    Comorbidities T2DM, hyperlipidemia, hx of Cx    Examination-Activity Limitations Bend;Locomotion Level;Transfers;Sit;Bed Mobility;Squat;Stairs;Stand;Lift;Dressing    Examination-Participation Restrictions Occupation;Cleaning;Community Activity;Shop    Stability/Clinical Decision Making Stable/Uncomplicated    Clinical Decision Making Low    Rehab Potential Excellent    PT Frequency 2x / week    PT Duration 8 weeks    PT Treatment/Interventions ADLs/Self Care Home Management;Cryotherapy;Gait training;Stair training;Functional mobility training;Therapeutic activities;Therapeutic  exercise;Balance training;Neuromuscular re-education;Manual techniques;Passive range of motion;Scar mobilization;Patient/family education;Manual lymph drainage;Compression bandaging;Vasopneumatic Device;Taping    PT Next Visit Plan Assess response to HEP, assess balance as able, progress early quadriceps strengthening, R knee AAROM    PT Kinder and Agree with Plan of Care Patient;Family member/caregiver    Family Member Consulted Niece, April             Patient will benefit from skilled therapeutic intervention in order to improve the following deficits and impairments:  Abnormal gait, Decreased range of motion, Difficulty walking, Decreased activity tolerance, Pain, Impaired flexibility, Hypomobility, Decreased balance, Decreased strength, Decreased mobility  Visit Diagnosis: Chronic pain of right knee  Swelling of joint of right knee  Stiffness of right knee, not elsewhere classified  Unsteadiness on feet  Muscle weakness (generalized)     Problem List Patient Active Problem List   Diagnosis Date Noted   S/P total knee arthroplasty, right 09/11/2021   Pre-op evaluation 07/17/2021   Wound of abdomen 05/04/2021   Carpal tunnel syndrome of left wrist 10/13/2020   Chronic pain of right knee 10/13/2020   Healthcare maintenance 09/13/2019   OA (osteoarthritis) of knee 11/11/2018   Hyperlipidemia 08/26/2017   Type 2 diabetes mellitus with hyperglycemia, without long-term current use of insulin ([place]) 01/08/2016   Allergic rhinitis 11/22/2014   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[place]) 10/14/2013    Vanessa Treutlen, PT, DPT 09/13/21 2:38 PM   [facility] [address] [place], [place], [address] Phone: [phone]   Fax:  [phone]  Name: Veronica Rogers MRN: [id number] Date of Birth: 20-Aug-1961

## 2021-09-13 NOTE — Patient Instructions (Signed)
  [id number]

## 2021-09-14 ENCOUNTER — Other Ambulatory Visit (HOSPITAL_COMMUNITY): Payer: Self-pay

## 2021-09-14 ENCOUNTER — Ambulatory Visit: Payer: No Typology Code available for payment source

## 2021-09-14 DIAGNOSIS — G8929 Other chronic pain: Secondary | ICD-10-CM

## 2021-09-14 DIAGNOSIS — M25661 Stiffness of right knee, not elsewhere classified: Secondary | ICD-10-CM

## 2021-09-14 DIAGNOSIS — R2681 Unsteadiness on feet: Secondary | ICD-10-CM

## 2021-09-14 DIAGNOSIS — M25561 Pain in right knee: Secondary | ICD-10-CM | POA: Diagnosis not present

## 2021-09-14 DIAGNOSIS — M6281 Muscle weakness (generalized): Secondary | ICD-10-CM

## 2021-09-14 DIAGNOSIS — M25461 Effusion, right knee: Secondary | ICD-10-CM

## 2021-09-14 NOTE — Patient Instructions (Signed)
  [id number]

## 2021-09-14 NOTE — Therapy (Signed)
[place], [place], [address] Phone: [phone]   Fax:  [phone]  Physical Therapy Treatment  Patient Details  Name: Veronica Rogers MRN: [id number] Date of Birth: 05-15-1961 Referring Provider (PT): Irving Copas, [place]   Encounter Date: 09/14/2021   PT End of Session - 09/14/21 1116     Visit Number 2    Number of Visits 17    Date for PT Re-Evaluation 11/08/21    Authorization Type [employer]    PT Start Time 1100   Pt arrived 15 minutes late   PT Stop Time 1140   Vasopneumatic x10 minutes   PT Time Calculation (min) 40 min    Activity Tolerance Patient tolerated treatment well;Patient limited by pain    Behavior During Therapy [facility] for tasks assessed/performed             Past Medical History:  Diagnosis Date   Arthritis    Breast cancer ([place])    Contact lens/glasses fitting    wears contacts or glasses   Diabetes ([place]) 11/22/2014   GERD (gastroesophageal reflux disease)    Hyperlipidemia    Personal history of chemotherapy    Personal history of radiation therapy    Radiation 03/28/14-05/06/14   Left Breast 60 Gy    Past Surgical History:  Procedure Laterality Date   BREAST BIOPSY  1996   right breast, benign   BREAST LUMPECTOMY WITH NEEDLE LOCALIZATION AND AXILLARY SENTINEL LYMPH NODE BX Left 10/29/2013   Procedure: BREAST LUMPECTOMY WITH NEEDLE LOCALIZATION AND AXILLARY SENTINEL LYMPH NODE BX;  Surgeon: Rolm Bookbinder, MD;  Location: [place];  Service: General;  Laterality: Left;   COLONOSCOPY  05/2011   WNL   TOTAL KNEE ARTHROPLASTY Right 09/11/2021   Procedure: TOTAL KNEE ARTHROPLASTY;  Surgeon: Paralee Cancel, MD;  Location[facility];  Service: Orthopedics;  Laterality: Right;   WISDOM TOOTH EXTRACTION      There were no vitals filed for this visit.   Subjective Assessment - 09/14/21 1058     Subjective Pt reports continued soreness and difficulty  walking since her last visit. She also reports doing her exercises and elevateing and icing the past few days. She still reports significant swelling.    Currently in Pain? Yes    Pain Score 7     Pain Location Knee    Pain Orientation Right    Pain Descriptors / Indicators Aching;Sore;Burning    Pain Type Surgical pain    Pain Onset In the past 7 days    Pain Frequency Intermittent                               [facility] Adult PT Treatment/Exercise - 09/14/21 0001       Knee/Hip Exercises: Stretches   Other Knee/Hip Stretches AAROM knee flexion with strap 2x8 with 10 sec hold      Knee/Hip Exercises: Seated   Long Arc Quad AROM    Long Arc Quad Limitations 3x10    Hamstring Curl AAROM    Hamstring Limitations 3x10      Knee/Hip Exercises: Supine   Other Supine Knee/Hip Exercises Hamstring set 2x10 with 5sec hold    Other Supine Knee/Hip Exercises Glute set 2x10 with 5sec hold      Modalities   Modalities Vasopneumatic      Vasopneumatic   Number Minutes Vasopneumatic  10 minutes  Vasopnuematic Location  Knee   R   Vasopneumatic Pressure Medium    Vasopneumatic Temperature  34                     PT Education - 09/14/21 1116     Education Details Updated HEP    Person(s) Educated Patient;Other (comment)   Sister   Methods Explanation;Demonstration;Handout    Comprehension Verbalized understanding;Returned demonstration              PT Short Term Goals - 09/13/21 1423       PT SHORT TERM GOAL #1   Title Pt will report understanding and adherence to her HEP in order to promote independence in the management of her primaryt sxs.    Baseline HEP provided at eval    Time 4    Period Weeks    Status New    Target Date 10/11/21      PT SHORT TERM GOAL #2   Title Pt will report 50% decrease in swelling in order to achieve further R knee functional ROM.    Baseline Severe swelling in R knee and lower leg    Time 4    Period Weeks     Status New    Target Date 10/11/21      PT SHORT TERM GOAL #3   Title Pt will complete a long arc quad with no quad lag in order to progress to closed-chain LE strengthening exercises.    Baseline Unable to perform    Time 4    Period Weeks    Status New    Target Date 10/11/21               PT Long Term Goals - 09/13/21 1424       PT LONG TERM GOAL #1   Title Pt will demonstrate the ability to ambulate a 6MWT without an AD or seated rest in order to return to walking 5K's without limitation.    Baseline WBAT, utilizing FWW for gait, antalgic gait pattern    Time 8    Period Weeks    Status New    Target Date 11/08/21      PT LONG TERM GOAL #2   Title Pt will achieve a FOTO score of 45% in order to demonstrate improved functional ability as it relates to her knee sxs.    Baseline 10%    Time 8    Period Weeks    Status New    Target Date 11/08/21      PT LONG TERM GOAL #3   Title Pt will achieve R knee extension AROM of 5-0 degrees in order to achieve a normal gait pattern.    Baseline 33 degrees    Time 8    Period Weeks    Status New    Target Date 11/08/21      PT LONG TERM GOAL #4   Title Pt will achieve R knee flexion AROM of 125 degrees or greater in order to sit upright at work without limitation.    Baseline 50 degrees    Time 8    Period Weeks    Status New    Target Date 11/08/21      PT LONG TERM GOAL #5   Title Pt will achieve R knee flexion and extension MMT of 5/5 in order to progress her independent LE strengthening program.    Baseline 1/5    Time 8    Period  Weeks    Status New    Target Date 11/08/21                   Plan - 09/14/21 1118     Clinical Impression Statement Pt presents 15 minutes late to her appointment, leading to a truncated treatment session. She remains in high levels of pain throughout the session, but is able to complete her exercises with no increase in pain. She will continue to benefit from skilled  PT to address her primary impairments and return to her prior level of function without limitation.    Personal Factors and Comorbidities Comorbidity 3+    Comorbidities T2DM, hyperlipidemia, hx of Cx    Examination-Activity Limitations Bend;Locomotion Level;Transfers;Sit;Bed Mobility;Squat;Stairs;Stand;Lift;Dressing    Examination-Participation Restrictions Occupation;Cleaning;Community Activity;Shop    Stability/Clinical Decision Making Stable/Uncomplicated    Clinical Decision Making Low    Rehab Potential Excellent    PT Frequency 2x / week    PT Duration 8 weeks    PT Treatment/Interventions ADLs/Self Care Home Management;Cryotherapy;Gait training;Stair training;Functional mobility training;Therapeutic activities;Therapeutic exercise;Balance training;Neuromuscular re-education;Manual techniques;Passive range of motion;Scar mobilization;Patient/family education;Manual lymph drainage;Compression bandaging;Vasopneumatic Device;Taping    PT Next Visit Plan assess balance as able, progress early quadriceps strengthening, R knee AAROM    PT Kline and Agree with Plan of Care Patient;Family member/caregiver             Patient will benefit from skilled therapeutic intervention in order to improve the following deficits and impairments:  Abnormal gait, Decreased range of motion, Difficulty walking, Decreased activity tolerance, Pain, Impaired flexibility, Hypomobility, Decreased balance, Decreased strength, Decreased mobility  Visit Diagnosis: Chronic pain of right knee  Swelling of joint of right knee  Stiffness of right knee, not elsewhere classified  Unsteadiness on feet  Muscle weakness (generalized)     Problem List Patient Active Problem List   Diagnosis Date Noted   S/P total knee arthroplasty, right 09/11/2021   Pre-op evaluation 07/17/2021   Wound of abdomen 05/04/2021   Carpal tunnel syndrome of left wrist 10/13/2020   Chronic pain  of right knee 10/13/2020   Healthcare maintenance 09/13/2019   OA (osteoarthritis) of knee 11/11/2018   Hyperlipidemia 08/26/2017   Type 2 diabetes mellitus with hyperglycemia, without long-term current use of insulin ([place]) 01/08/2016   Allergic rhinitis 11/22/2014   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[place]) 10/14/2013    Vanessa [place], PT, DPT 09/14/21 11:34 AM   [facility] [address] [place], [place], [address] Phone: [phone]   Fax:  [phone]  Name: MACIL STROMGREN MRN: [id number] Date of Birth: [birth year]

## 2021-09-17 ENCOUNTER — Ambulatory Visit: Payer: No Typology Code available for payment source

## 2021-09-17 ENCOUNTER — Other Ambulatory Visit: Payer: Self-pay

## 2021-09-17 DIAGNOSIS — M25461 Effusion, right knee: Secondary | ICD-10-CM

## 2021-09-17 DIAGNOSIS — G8929 Other chronic pain: Secondary | ICD-10-CM

## 2021-09-17 DIAGNOSIS — M25661 Stiffness of right knee, not elsewhere classified: Secondary | ICD-10-CM

## 2021-09-17 DIAGNOSIS — M25561 Pain in right knee: Secondary | ICD-10-CM | POA: Diagnosis not present

## 2021-09-17 DIAGNOSIS — R2681 Unsteadiness on feet: Secondary | ICD-10-CM

## 2021-09-17 DIAGNOSIS — M6281 Muscle weakness (generalized): Secondary | ICD-10-CM

## 2021-09-17 NOTE — Therapy (Signed)
[place], [place], [address] Phone: [phone]   Fax:  [phone]  Physical Therapy Treatment  Patient Details  Name: Veronica Rogers MRN: [id number] Date of Birth: April 22, 1961 Referring Provider (PT): Irving Copas, [place]   Encounter Date: 09/17/2021   PT End of Session - 09/17/21 1146     Visit Number 3    Number of Visits 17    Date for PT Re-Evaluation 11/08/21    Authorization Type [employer]    PT Start Time 1130   Pt arrived 45 minutes late to appointment time. Took following appointment slot with 30 minutes left.   PT Stop Time 1210    PT Time Calculation (min) 40 min    Activity Tolerance Patient tolerated treatment well;Patient limited by pain    Behavior During Therapy [facility] for tasks assessed/performed             Past Medical History:  Diagnosis Date   Arthritis    Breast cancer ([place])    Contact lens/glasses fitting    wears contacts or glasses   Diabetes ([place]) 11/22/2014   GERD (gastroesophageal reflux disease)    Hyperlipidemia    Personal history of chemotherapy    Personal history of radiation therapy    Radiation 03/28/14-05/06/14   Left Breast 60 Gy    Past Surgical History:  Procedure Laterality Date   BREAST BIOPSY  1996   right breast, benign   BREAST LUMPECTOMY WITH NEEDLE LOCALIZATION AND AXILLARY SENTINEL LYMPH NODE BX Left 10/29/2013   Procedure: BREAST LUMPECTOMY WITH NEEDLE LOCALIZATION AND AXILLARY SENTINEL LYMPH NODE BX;  Surgeon: Rolm Bookbinder, MD;  Location: [facility];  Service: General;  Laterality: Left;   COLONOSCOPY  05/2011   WNL   TOTAL KNEE ARTHROPLASTY Right 09/11/2021   Procedure: TOTAL KNEE ARTHROPLASTY;  Surgeon: Paralee Cancel, MD;  Location: [facility];  Service: Orthopedics;  Laterality: Right;   WISDOM TOOTH EXTRACTION      There were no vitals filed for this visit.   Subjective Assessment - 09/17/21 1136     Subjective  Pt reports getting her appointment times confused today, arriving 45 minutes  late to her scheduled appointment. She reports her HEP has been going well and that she has been able to ambulate short distances without her walker.    Currently in Pain? Yes    Pain Score 3     Pain Location Knee    Pain Orientation Right    Pain Descriptors / Indicators Aching;Sore    Pain Type Surgical pain                [facility] PT Assessment - 09/17/21 0001       Observation/Other Assessments   Observations Visually apparent swelling in R thigh/ knee with bruising along posterolateral thigh      AROM   Right Knee Extension 24    Right Knee Flexion 75      PROM   Right Knee Flexion 85   AAROM with strap                          [facility] Adult PT Treatment/Exercise - 09/17/21 0001       Knee/Hip Exercises: Seated   Other Seated Knee/Hip Exercises Seated quad set with hamstring stretch with strap and heel on stool 3x1 minute      Knee/Hip Exercises: Supine   Straight Leg Raises Strengthening;Right;3 sets;10  reps    Knee Flexion AAROM;Right;2 sets;10 reps   With green strap     Modalities   Modalities Vasopneumatic      Vasopneumatic   Number Minutes Vasopneumatic  10 minutes    Vasopnuematic Location  Knee   R   Vasopneumatic Pressure Medium    Vasopneumatic Temperature  34                     PT Education - 09/17/21 1146     Education Details Updated HEP, emphasized importance of being at her appointments on time.    Person(s) Educated Patient;Other (comment)   Brother-in-law   Methods Explanation;Demonstration;Handout    Comprehension Verbalized understanding;Returned demonstration              PT Short Term Goals - 09/13/21 1423       PT SHORT TERM GOAL #1   Title Pt will report understanding and adherence to her HEP in order to promote independence in the management of her primaryt sxs.    Baseline HEP provided at eval    Time 4    Period  Weeks    Status New    Target Date 10/11/21      PT SHORT TERM GOAL #2   Title Pt will report 50% decrease in swelling in order to achieve further R knee functional ROM.    Baseline Severe swelling in R knee and lower leg    Time 4    Period Weeks    Status New    Target Date 10/11/21      PT SHORT TERM GOAL #3   Title Pt will complete a long arc quad with no quad lag in order to progress to closed-chain LE strengthening exercises.    Baseline Unable to perform    Time 4    Period Weeks    Status New    Target Date 10/11/21               PT Long Term Goals - 09/13/21 1424       PT LONG TERM GOAL #1   Title Pt will demonstrate the ability to ambulate a 6MWT without an AD or seated rest in order to return to walking 5K's without limitation.    Baseline WBAT, utilizing FWW for gait, antalgic gait pattern    Time 8    Period Weeks    Status New    Target Date 11/08/21      PT LONG TERM GOAL #2   Title Pt will achieve a FOTO score of 45% in order to demonstrate improved functional ability as it relates to her knee sxs.    Baseline 10%    Time 8    Period Weeks    Status New    Target Date 11/08/21      PT LONG TERM GOAL #3   Title Pt will achieve R knee extension AROM of 5-0 degrees in order to achieve a normal gait pattern.    Baseline 33 degrees    Time 8    Period Weeks    Status New    Target Date 11/08/21      PT LONG TERM GOAL #4   Title Pt will achieve R knee flexion AROM of 125 degrees or greater in order to sit upright at work without limitation.    Baseline 50 degrees    Time 8    Period Weeks    Status New    Target  Date 11/08/21      PT LONG TERM GOAL #5   Title Pt will achieve R knee flexion and extension MMT of 5/5 in order to progress her independent LE strengthening program.    Baseline 1/5    Time 8    Period Weeks    Status New    Target Date 11/08/21                   Plan - 09/17/21 1155     Clinical Impression  Statement Pt presents 45 minutes late to her scheduled appointment time; she opted to attend the following available treatment slot, although there was only 30 minutes left in this particular slot. This led to a truncated treatment session. She reports improved symptoms since starting PT, adding that her exercises have been helping. She demonstrates good functional control of SLR's today, achieving 3 sets of 10 with minor quad lag. She also remains limited in R knee flexion and extension AROM, although it has greatly improved since her initial eval. She is on pace to achieve her functional goals of therapy. She will continue to benefit from skilled PT to address her primary impairments and return to her prior level of function without limitation.    Personal Factors and Comorbidities Comorbidity 3+    Comorbidities T2DM, hyperlipidemia, hx of Cx    Examination-Activity Limitations Bend;Locomotion Level;Transfers;Sit;Bed Mobility;Squat;Stairs;Stand;Lift;Dressing    Examination-Participation Restrictions Occupation;Cleaning;Community Activity;Shop    Stability/Clinical Decision Making Stable/Uncomplicated    Clinical Decision Making Low    Rehab Potential Excellent    PT Frequency 2x / week    PT Duration 8 weeks    PT Treatment/Interventions ADLs/Self Care Home Management;Cryotherapy;Gait training;Stair training;Functional mobility training;Therapeutic activities;Therapeutic exercise;Balance training;Neuromuscular re-education;Manual techniques;Passive range of motion;Scar mobilization;Patient/family education;Manual lymph drainage;Compression bandaging;Vasopneumatic Device;Taping    PT Next Visit Plan assess balance as able, progress early quadriceps strengthening, R knee AAROM    PT [place] and Agree with Plan of Care Patient;Family member/caregiver    Family Member Consulted brother-in-law             Patient will benefit from skilled therapeutic intervention  in order to improve the following deficits and impairments:  Abnormal gait, Decreased range of motion, Difficulty walking, Decreased activity tolerance, Pain, Impaired flexibility, Hypomobility, Decreased balance, Decreased strength, Decreased mobility  Visit Diagnosis: Chronic pain of right knee  Swelling of joint of right knee  Stiffness of right knee, not elsewhere classified  Unsteadiness on feet  Muscle weakness (generalized)     Problem List Patient Active Problem List   Diagnosis Date Noted   S/P total knee arthroplasty, right 09/11/2021   Pre-op evaluation 07/17/2021   Wound of abdomen 05/04/2021   Carpal tunnel syndrome of left wrist 10/13/2020   Chronic pain of right knee 10/13/2020   Healthcare maintenance 09/13/2019   OA (osteoarthritis) of knee 11/11/2018   Hyperlipidemia 08/26/2017   Type 2 diabetes mellitus with hyperglycemia, without long-term current use of insulin ([place]) 01/08/2016   Allergic rhinitis 11/22/2014   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[place]) 10/14/2013    Vanessa Inwood, PT, DPT 09/17/21 12:16 PM   [facility] [address] [place], [place], [address] Phone: [phone]   Fax:  [phone]  Name: Veronica Rogers MRN: [id number] Date of Birth: January 16, 1961

## 2021-09-17 NOTE — Patient Instructions (Addendum)
   [id number]

## 2021-09-19 ENCOUNTER — Other Ambulatory Visit: Payer: Self-pay

## 2021-09-19 ENCOUNTER — Other Ambulatory Visit (HOSPITAL_COMMUNITY): Payer: Self-pay

## 2021-09-19 ENCOUNTER — Ambulatory Visit: Payer: No Typology Code available for payment source

## 2021-09-19 DIAGNOSIS — R2681 Unsteadiness on feet: Secondary | ICD-10-CM

## 2021-09-19 DIAGNOSIS — M6281 Muscle weakness (generalized): Secondary | ICD-10-CM

## 2021-09-19 DIAGNOSIS — M25461 Effusion, right knee: Secondary | ICD-10-CM

## 2021-09-19 DIAGNOSIS — G8929 Other chronic pain: Secondary | ICD-10-CM

## 2021-09-19 DIAGNOSIS — M25661 Stiffness of right knee, not elsewhere classified: Secondary | ICD-10-CM

## 2021-09-19 DIAGNOSIS — M25561 Pain in right knee: Secondary | ICD-10-CM | POA: Diagnosis not present

## 2021-09-19 NOTE — Therapy (Signed)
[place], [place], [address] Phone: [phone]   Fax:  [phone]  Physical Therapy Treatment  Patient Details  Name: Veronica Rogers MRN: [id number] Date of Birth: 01/06/1961 Referring Provider (PT): Irving Copas, [place]   Encounter Date: 09/19/2021   PT End of Session - 09/19/21 1156     Visit Number 4    Number of Visits 17    Date for PT Re-Evaluation 11/08/21    Authorization Type [employer]    PT Start Time 1130    PT Stop Time 5701    PT Time Calculation (min) 45 min    Activity Tolerance Patient tolerated treatment well;Patient limited by pain    Behavior During Therapy [facility] for tasks assessed/performed             Past Medical History:  Diagnosis Date   Arthritis    Breast cancer ([place])    Contact lens/glasses fitting    wears contacts or glasses   Diabetes ([place]) 11/22/2014   GERD (gastroesophageal reflux disease)    Hyperlipidemia    Personal history of chemotherapy    Personal history of radiation therapy    Radiation 03/28/14-05/06/14   Left Breast 60 Gy    Past Surgical History:  Procedure Laterality Date   BREAST BIOPSY  1996   right breast, benign   BREAST LUMPECTOMY WITH NEEDLE LOCALIZATION AND AXILLARY SENTINEL LYMPH NODE BX Left 10/29/2013   Procedure: BREAST LUMPECTOMY WITH NEEDLE LOCALIZATION AND AXILLARY SENTINEL LYMPH NODE BX;  Surgeon: Rolm Bookbinder, MD;  Location: [facility];  Service: General;  Laterality: Left;   COLONOSCOPY  05/2011   WNL   TOTAL KNEE ARTHROPLASTY Right 09/11/2021   Procedure: TOTAL KNEE ARTHROPLASTY;  Surgeon: Paralee Cancel, MD;  [facility];  Service: Orthopedics;  Laterality: Right;   WISDOM TOOTH EXTRACTION      There were no vitals filed for this visit.   Subjective Assessment - 09/19/21 1126     Subjective Pt reports doing "pretty good" the past few days. She has been able to get dressed and bathe without  difficulty. She reports doing her exercises every day.    Currently in Pain? Yes    Pain Score 2     Pain Location Knee    Pain Orientation Right    Pain Descriptors / Indicators Aching;Sore    Pain Type Surgical pain    Pain Onset 1 to 4 weeks ago    Pain Frequency Intermittent                [facility] PT Assessment - 09/19/21 0001       AROM   Right Knee Extension 15    Right Knee Flexion 80      PROM   Right Knee Flexion 91   AAROM with green strap                          [facility] Adult PT Treatment/Exercise - 09/19/21 0001       Knee/Hip Exercises: Standing   Lateral Step Up 3 sets;10 reps;Both;Step Height: 4"    Forward Step Up Both;3 sets;10 reps;Step Height: 4"    Functional Squat 3 sets;10 reps   Quarter squats     Knee/Hip Exercises: Seated   Other Seated Knee/Hip Exercises Seated quad set with hamstring stretch with strap and heel on stool 3x1 minute      Knee/Hip Exercises: Supine  Straight Leg Raises Strengthening    Straight Leg Raises Limitations 3x10 with 1# ankle weight    Knee Flexion AAROM;Right;2 sets;10 reps      Knee/Hip Exercises: Prone   Hamstring Curl 2 sets;10 reps   BIL   Hamstring Curl Limitations 2# ankle weights                       PT Short Term Goals - 09/13/21 1423       PT SHORT TERM GOAL #1   Title Pt will report understanding and adherence to her HEP in order to promote independence in the management of her primaryt sxs.    Baseline HEP provided at eval    Time 4    Period Weeks    Status New    Target Date 10/11/21      PT SHORT TERM GOAL #2   Title Pt will report 50% decrease in swelling in order to achieve further R knee functional ROM.    Baseline Severe swelling in R knee and lower leg    Time 4    Period Weeks    Status New    Target Date 10/11/21      PT SHORT TERM GOAL #3   Title Pt will complete a long arc quad with no quad lag in order to progress to closed-chain LE  strengthening exercises.    Baseline Unable to perform    Time 4    Period Weeks    Status New    Target Date 10/11/21               PT Long Term Goals - 09/13/21 1424       PT LONG TERM GOAL #1   Title Pt will demonstrate the ability to ambulate a 6MWT without an AD or seated rest in order to return to walking 5K's without limitation.    Baseline WBAT, utilizing FWW for gait, antalgic gait pattern    Time 8    Period Weeks    Status New    Target Date 11/08/21      PT LONG TERM GOAL #2   Title Pt will achieve a FOTO score of 45% in order to demonstrate improved functional ability as it relates to her knee sxs.    Baseline 10%    Time 8    Period Weeks    Status New    Target Date 11/08/21      PT LONG TERM GOAL #3   Title Pt will achieve R knee extension AROM of 5-0 degrees in order to achieve a normal gait pattern.    Baseline 33 degrees    Time 8    Period Weeks    Status New    Target Date 11/08/21      PT LONG TERM GOAL #4   Title Pt will achieve R knee flexion AROM of 125 degrees or greater in order to sit upright at work without limitation.    Baseline 50 degrees    Time 8    Period Weeks    Status New    Target Date 11/08/21      PT LONG TERM GOAL #5   Title Pt will achieve R knee flexion and extension MMT of 5/5 in order to progress her independent LE strengthening program.    Baseline 1/5    Time 8    Period Weeks    Status New    Target Date 11/08/21  Plan - 09/19/21 1157     Clinical Impression Statement Pt arrives without an AD today and is able to ambulate with a mild R antalgic gait. She responded well to all interventions, demonstrating the ability to perform all exercises with good form and no increase in pain. Due to improved R knee swelling, the pt declines vasopneumatic treatment in favor of additional exercises. She will continue to benefit from skilled PT to address her primary impairments and return to her  prior level of function without limitation.    Personal Factors and Comorbidities Comorbidity 3+    Comorbidities T2DM, hyperlipidemia, hx of Cx    Examination-Activity Limitations Bend;Locomotion Level;Transfers;Sit;Bed Mobility;Squat;Stairs;Stand;Lift;Dressing    Examination-Participation Restrictions Occupation;Cleaning;Community Activity;Shop    Stability/Clinical Decision Making Stable/Uncomplicated    Clinical Decision Making Low    Rehab Potential Excellent    PT Frequency 2x / week    PT Duration 8 weeks    PT Treatment/Interventions ADLs/Self Care Home Management;Cryotherapy;Gait training;Stair training;Functional mobility training;Therapeutic activities;Therapeutic exercise;Balance training;Neuromuscular re-education;Manual techniques;Passive range of motion;Scar mobilization;Patient/family education;Manual lymph drainage;Compression bandaging;Vasopneumatic Device;Taping    PT Next Visit Plan assess balance as able, progress early quadriceps strengthening, R knee AAROM, utilize [employer] Knee TKA protocol    PT Home Exercise Plan [place] and Agree with Plan of Care Patient             Patient will benefit from skilled therapeutic intervention in order to improve the following deficits and impairments:  Abnormal gait, Decreased range of motion, Difficulty walking, Decreased activity tolerance, Pain, Impaired flexibility, Hypomobility, Decreased balance, Decreased strength, Decreased mobility  Visit Diagnosis: Chronic pain of right knee  Swelling of joint of right knee  Stiffness of right knee, not elsewhere classified  Unsteadiness on feet  Muscle weakness (generalized)     Problem List Patient Active Problem List   Diagnosis Date Noted   S/P total knee arthroplasty, right 09/11/2021   Pre-op evaluation 07/17/2021   Wound of abdomen 05/04/2021   Carpal tunnel syndrome of left wrist 10/13/2020   Chronic pain of right knee  10/13/2020   Healthcare maintenance 09/13/2019   OA (osteoarthritis) of knee 11/11/2018   Hyperlipidemia 08/26/2017   Type 2 diabetes mellitus with hyperglycemia, without long-term current use of insulin ([place]) 01/08/2016   Allergic rhinitis 11/22/2014   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[place]) 10/14/2013    Vanessa Maynard, PT, DPT 09/19/21 12:12 PM   [facility] [address] [place], [place], [address] Phone: [phone]   Fax:  [phone]  Name: Veronica Rogers MRN: [id number] Date of Birth: [birth year]

## 2021-09-20 ENCOUNTER — Other Ambulatory Visit (HOSPITAL_COMMUNITY): Payer: Self-pay

## 2021-09-24 ENCOUNTER — Ambulatory Visit: Payer: No Typology Code available for payment source

## 2021-09-24 ENCOUNTER — Other Ambulatory Visit: Payer: Self-pay

## 2021-09-24 DIAGNOSIS — R2681 Unsteadiness on feet: Secondary | ICD-10-CM

## 2021-09-24 DIAGNOSIS — M6281 Muscle weakness (generalized): Secondary | ICD-10-CM

## 2021-09-24 DIAGNOSIS — G8929 Other chronic pain: Secondary | ICD-10-CM

## 2021-09-24 DIAGNOSIS — M25461 Effusion, right knee: Secondary | ICD-10-CM

## 2021-09-24 DIAGNOSIS — M25661 Stiffness of right knee, not elsewhere classified: Secondary | ICD-10-CM

## 2021-09-24 DIAGNOSIS — M25561 Pain in right knee: Secondary | ICD-10-CM | POA: Diagnosis not present

## 2021-09-24 NOTE — Therapy (Signed)
[place], [place], [address] Phone: [phone]   Fax:  [phone]  Physical Therapy Treatment  Patient Details  Name: Veronica Rogers MRN: [id number] Date of Birth: 08-May-1961 Referring Provider (PT): Irving Copas, [place]   Encounter Date: 09/24/2021   PT End of Session - 09/24/21 1120     Visit Number 5    Number of Visits 17    Date for PT Re-Evaluation 11/08/21    Authorization Type [employer]    PT Start Time 0932    PT Stop Time 1130    PT Time Calculation (min) 45 min    Activity Tolerance Patient tolerated treatment well;Patient limited by pain    Behavior During Therapy [facility] for tasks assessed/performed             Past Medical History:  Diagnosis Date   Arthritis    Breast cancer ([place])    Contact lens/glasses fitting    wears contacts or glasses   Diabetes ([place]) 11/22/2014   GERD (gastroesophageal reflux disease)    Hyperlipidemia    Personal history of chemotherapy    Personal history of radiation therapy    Radiation 03/28/14-05/06/14   Left Breast 60 Gy    Past Surgical History:  Procedure Laterality Date   BREAST BIOPSY  1996   right breast, benign   BREAST LUMPECTOMY WITH NEEDLE LOCALIZATION AND AXILLARY SENTINEL LYMPH NODE BX Left 10/29/2013   Procedure: BREAST LUMPECTOMY WITH NEEDLE LOCALIZATION AND AXILLARY SENTINEL LYMPH NODE BX;  Surgeon: Rolm Bookbinder, MD;  Location: [facility];  Service: General;  Laterality: Left;   COLONOSCOPY  05/2011   WNL   TOTAL KNEE ARTHROPLASTY Right 09/11/2021   Procedure: TOTAL KNEE ARTHROPLASTY;  Surgeon: Paralee Cancel, MD;  Location: [facility];  Service: Orthopedics;  Laterality: Right;   WISDOM TOOTH EXTRACTION      There were no vitals filed for this visit.   Subjective Assessment - 09/24/21 1048     Subjective Pt reports feeling well since her last appointment, adding that she hasn't taken pain medication the  past 4 days. She also reports she has been doing her HEP daily.    Limitations Sitting;Walking;Standing;House hold activities    Currently in Pain? Yes    Pain Score 2     Pain Location Knee    Pain Orientation Right    Pain Descriptors / Indicators Aching;Sore    Pain Type Surgical pain    Pain Onset 1 to 4 weeks ago    Pain Frequency Intermittent                               [facility] Adult PT Treatment/Exercise - 09/24/21 0001       Knee/Hip Exercises: Stretches   Active Hamstring Stretch Right;3 reps;30 seconds      Knee/Hip Exercises: Aerobic   Stationary Bike x5 minutes, no resistance, while collecting subjective hx      Knee/Hip Exercises: Machines for Strengthening   Total Gym Leg Press 25# 2x10      Knee/Hip Exercises: Standing   Lateral Step Up 3 sets;10 reps;Both;Step Height: 8"    Functional Squat 3 sets;10 reps   half squat     Knee/Hip Exercises: Seated   Long Arc Quad Strengthening;Right;3 sets;10 reps    Long Arc Quad Limitations Yellow TB    Hamstring Curl Strengthening    Hamstring Limitations 3x10 BIL  Hamstring Weights 2 lbs.                     PT Education - 09/24/21 1119     Education Details Updated HEP.    Person(s) Educated Patient    Methods Explanation;Demonstration;Handout    Comprehension Verbalized understanding;Returned demonstration              PT Short Term Goals - 09/13/21 1423       PT SHORT TERM GOAL #1   Title Pt will report understanding and adherence to her HEP in order to promote independence in the management of her primaryt sxs.    Baseline HEP provided at eval    Time 4    Period Weeks    Status New    Target Date 10/11/21      PT SHORT TERM GOAL #2   Title Pt will report 50% decrease in swelling in order to achieve further R knee functional ROM.    Baseline Severe swelling in R knee and lower leg    Time 4    Period Weeks    Status New    Target Date 10/11/21      PT  SHORT TERM GOAL #3   Title Pt will complete a long arc quad with no quad lag in order to progress to closed-chain LE strengthening exercises.    Baseline Unable to perform    Time 4    Period Weeks    Status New    Target Date 10/11/21               PT Long Term Goals - 09/13/21 1424       PT LONG TERM GOAL #1   Title Pt will demonstrate the ability to ambulate a 6MWT without an AD or seated rest in order to return to walking 5K's without limitation.    Baseline WBAT, utilizing FWW for gait, antalgic gait pattern    Time 8    Period Weeks    Status New    Target Date 11/08/21      PT LONG TERM GOAL #2   Title Pt will achieve a FOTO score of 45% in order to demonstrate improved functional ability as it relates to her knee sxs.    Baseline 10%    Time 8    Period Weeks    Status New    Target Date 11/08/21      PT LONG TERM GOAL #3   Title Pt will achieve R knee extension AROM of 5-0 degrees in order to achieve a normal gait pattern.    Baseline 33 degrees    Time 8    Period Weeks    Status New    Target Date 11/08/21      PT LONG TERM GOAL #4   Title Pt will achieve R knee flexion AROM of 125 degrees or greater in order to sit upright at work without limitation.    Baseline 50 degrees    Time 8    Period Weeks    Status New    Target Date 11/08/21      PT LONG TERM GOAL #5   Title Pt will achieve R knee flexion and extension MMT of 5/5 in order to progress her independent LE strengthening program.    Baseline 1/5    Time 8    Period Weeks    Status New    Target Date 11/08/21  Plan - 09/24/21 1126     Clinical Impression Statement Pt responded well to all interventions, demonstrating the ability to perform all exercises with good form and no increase in pain. Due to improved R knee swelling, the pt declines vasopneumatic treatment in favor of additional exercises. She was able to increase exercise intensity today with step-up  height, squat depth, and hamstring curl/ LAQ resistance. She will continue to benefit from skilled PT to address her primary impairments and return to her prior level of function without limitation.    Personal Factors and Comorbidities Comorbidity 3+    Comorbidities T2DM, hyperlipidemia, hx of Cx    Examination-Activity Limitations Bend;Locomotion Level;Transfers;Sit;Bed Mobility;Squat;Stairs;Stand;Lift;Dressing    Examination-Participation Restrictions Occupation;Cleaning;Community Activity;Shop    Stability/Clinical Decision Making Stable/Uncomplicated    Clinical Decision Making Low    Rehab Potential Excellent    PT Frequency 2x / week    PT Duration 8 weeks    PT Treatment/Interventions ADLs/Self Care Home Management;Cryotherapy;Gait training;Stair training;Functional mobility training;Therapeutic activities;Therapeutic exercise;Balance training;Neuromuscular re-education;Manual techniques;Passive range of motion;Scar mobilization;Patient/family education;Manual lymph drainage;Compression bandaging;Vasopneumatic Device;Taping    PT Next Visit Plan assess balance as able, progress early quadriceps strengthening, R knee AAROM, utilize [employer] Knee TKA protocol    PT Home Exercise Plan [place] and Agree with Plan of Care Patient             Patient will benefit from skilled therapeutic intervention in order to improve the following deficits and impairments:  Abnormal gait, Decreased range of motion, Difficulty walking, Decreased activity tolerance, Pain, Impaired flexibility, Hypomobility, Decreased balance, Decreased strength, Decreased mobility  Visit Diagnosis: Chronic pain of right knee  Swelling of joint of right knee  Stiffness of right knee, not elsewhere classified  Unsteadiness on feet  Muscle weakness (generalized)     Problem List Patient Active Problem List   Diagnosis Date Noted   S/P total knee arthroplasty, right  09/11/2021   Pre-op evaluation 07/17/2021   Wound of abdomen 05/04/2021   Carpal tunnel syndrome of left wrist 10/13/2020   Chronic pain of right knee 10/13/2020   Healthcare maintenance 09/13/2019   OA (osteoarthritis) of knee 11/11/2018   Hyperlipidemia 08/26/2017   Type 2 diabetes mellitus with hyperglycemia, without long-term current use of insulin ([place]) 01/08/2016   Allergic rhinitis 11/22/2014   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[place]) 10/14/2013    Vanessa Mendocino, PT, DPT 09/24/21 11:30 AM   [facility] [address] [place], [place], [address] Phone: [phone]   Fax:  [phone]  Name: KANYON BUNN MRN: [id number] Date of Birth: [birth year]

## 2021-09-24 NOTE — Patient Instructions (Signed)
  [id number]

## 2021-09-26 ENCOUNTER — Other Ambulatory Visit (HOSPITAL_COMMUNITY): Payer: Self-pay

## 2021-09-26 ENCOUNTER — Ambulatory Visit: Payer: No Typology Code available for payment source

## 2021-09-26 MED ORDER — PREGABALIN 75 MG PO CAPS
75.0000 mg | ORAL_CAPSULE | Freq: Two times a day (BID) | ORAL | 1 refills | Status: DC
  Filled 2021-09-26: qty 60, 30d supply, fill #0

## 2021-09-26 NOTE — Discharge Summary (Signed)
Physician Discharge Summary   Patient ID: Veronica Rogers MRN: [id number] DOB/AGE: 61-Aug-1962 61 y.o.  Admit date: 09/11/2021 Discharge date: 09/12/2021  Primary Diagnosis: Right knee osteoarthritis.  Admission Diagnoses:  Past Medical History:  Diagnosis Date   Arthritis    Breast cancer ([place])    Contact lens/glasses fitting    wears contacts or glasses   Diabetes ([place]) 11/22/2014   GERD (gastroesophageal reflux disease)    Hyperlipidemia    Personal history of chemotherapy    Personal history of radiation therapy    Radiation 03/28/14-05/06/14   Left Breast 60 Gy   Discharge Diagnoses:   Active Problems:   S/P total knee arthroplasty, right  Estimated body mass index is 29.16 kg/m as calculated from the following:   Height as of this encounter: $RemoveBeforeD'5\' 7"'[id number]$  (1.702 m).   Weight as of this encounter: 84.5 kg.  Procedure:  Procedure(s) (LRB): TOTAL KNEE ARTHROPLASTY (Right)   Consults: None  HPI: Veronica Rogers is a 60 y.o. female patient of   mine.  The patient had been seen, evaluated, and treated for months conservatively in the   office with medication, activity modification, and injections.  The patient had   radiographic changes of bone-on-bone arthritis with endplate sclerosis and osteophytes noted.  Based on the radiographic changes and failed conservative measures, the patient   decided to proceed with definitive treatment, total knee replacement.  Risks of infection, DVT, component failure, need for revision surgery, neurovascular injury were reviewed in the office setting.  The postop course was reviewed stressing the efforts to maximize post-operative satisfaction and function.  Consent was obtained for benefit of pain   relief.      Laboratory Data: Admission on 09/11/2021, Discharged on 09/12/2021  Component Date Value Ref Range Status   Glucose-Capillary 09/11/2021 113 (A) 70 - 99 mg/dL Final   Glucose reference range applies only to samples taken after  fasting for at least 8 hours.   ABO/RH(D) 09/11/2021 A POS   Final   Antibody Screen 09/11/2021 NEG   Final   Sample Expiration 09/11/2021    Final                   Value:09/14/2021,2359 Performed at [facility], [place] [address], [place], [place] [address]    Glucose-Capillary 09/11/2021 119 (A) 70 - 99 mg/dL Final   Glucose reference range applies only to samples taken after fasting for at least 8 hours.   WBC 09/12/2021 10.9 (A) 4.0 - 10.5 K/uL Final   RBC 09/12/2021 3.24 (A) 3.87 - 5.11 MIL/uL Final   Hemoglobin 09/12/2021 8.7 (A) 12.0 - 15.0 g/dL Final   HCT 09/12/2021 27.8 (A) 36.0 - 46.0 % Final   MCV 09/12/2021 85.8  80.0 - 100.0 fL Final   MCH 09/12/2021 26.9  26.0 - 34.0 pg Final   MCHC 09/12/2021 31.3  30.0 - 36.0 g/dL Final   RDW 09/12/2021 13.9  11.5 - 15.5 % Final   Platelets 09/12/2021 189  150 - 400 K/uL Final   nRBC 09/12/2021 0.0  0.0 - 0.2 % Final   Performed at [facility], [place] [address] [place], [place] [address]   Sodium 09/12/2021 142  135 - 145 mmol/L Final   Potassium 09/12/2021 4.3  3.5 - 5.1 mmol/L Final   Chloride 09/12/2021 107  98 - 111 mmol/L Final   CO2 09/12/2021 28  22 - 32 mmol/L Final   Glucose, Bld 09/12/2021 173 (A) 70 -  99 mg/dL Final   Glucose reference range applies only to samples taken after fasting for at least 8 hours.   BUN 09/12/2021 17  6 - 20 mg/dL Final   Creatinine, Ser 09/12/2021 0.74  0.44 - 1.00 mg/dL Final   Calcium 09/12/2021 9.4  8.9 - 10.3 mg/dL Final   GFR, Estimated 09/12/2021 >60  >60 mL/min Final   Comment: (NOTE) Calculated using the CKD-EPI Creatinine Equation (2021)    Anion gap 09/12/2021 7  5 - 15 Final   Performed at [facility], [place] [address], [place], [place] [address]  Hospital Outpatient Visit on 09/04/2021  Component Date Value Ref Range Status   Sodium 09/04/2021 139  135 - 145 mmol/L Final   Potassium 09/04/2021 3.8  3.5 - 5.1 mmol/L Final    Chloride 09/04/2021 106  98 - 111 mmol/L Final   CO2 09/04/2021 25  22 - 32 mmol/L Final   Glucose, Bld 09/04/2021 229 (A) 70 - 99 mg/dL Final   Glucose reference range applies only to samples taken after fasting for at least 8 hours.   BUN 09/04/2021 15  6 - 20 mg/dL Final   Creatinine, Ser 09/04/2021 0.75  0.44 - 1.00 mg/dL Final   Calcium 09/04/2021 9.7  8.9 - 10.3 mg/dL Final   GFR, Estimated 09/04/2021 >60  >60 mL/min Final   Comment: (NOTE) Calculated using the CKD-EPI Creatinine Equation (2021)    Anion gap 09/04/2021 8  5 - 15 Final   Performed at [facility], [place] [address] [place], [place] [address]   WBC 09/04/2021 6.4  4.0 - 10.5 K/uL Final   RBC 09/04/2021 4.44  3.87 - 5.11 MIL/uL Final   Hemoglobin 09/04/2021 11.9 (A) 12.0 - 15.0 g/dL Final   HCT 09/04/2021 38.1  36.0 - 46.0 % Final   MCV 09/04/2021 85.8  80.0 - 100.0 fL Final   MCH 09/04/2021 26.8  26.0 - 34.0 pg Final   MCHC 09/04/2021 31.2  30.0 - 36.0 g/dL Final   RDW 09/04/2021 14.0  11.5 - 15.5 % Final   Platelets 09/04/2021 203  150 - 400 K/uL Final   nRBC 09/04/2021 0.0  0.0 - 0.2 % Final   Performed at [facility], [place] [address], [place], [place] [address]   MRSA, PCR 09/04/2021 NEGATIVE  NEGATIVE Final   Staphylococcus aureus 09/04/2021 NEGATIVE  NEGATIVE Final   Comment: (NOTE) The Xpert SA Assay (FDA approved for NASAL specimens in patients 5 years of age and older), is one component of a comprehensive surveillance program. It is not intended to diagnose infection nor to guide or monitor treatment. Performed at [facility], [place] [address] [place], [place] [address]    Glucose-Capillary 09/04/2021 232 (A) 70 - 99 mg/dL Final   Glucose reference range applies only to samples taken after fasting for at least 8 hours.  Hospital Outpatient Visit on 08/02/2021  Component Date Value Ref Range Status   MRSA, PCR 08/02/2021 NEGATIVE  NEGATIVE Final    Staphylococcus aureus 08/02/2021 NEGATIVE  NEGATIVE Final   Comment: (NOTE) The Xpert SA Assay (FDA approved for NASAL specimens in patients 41 years of age and older), is one component of a comprehensive surveillance program. It is not intended to diagnose infection nor to guide or monitor treatment. Performed at [facility], [place] [address] [place], [place] [address]    Hgb A1c MFr Bld 08/02/2021 6.7 (A) 4.8 - 5.6 % Final  Comment: (NOTE) Pre diabetes:          5.7%-6.4%  Diabetes:              >6.4%  Glycemic control for   <7.0% adults with diabetes    Mean Plasma Glucose 08/02/2021 145.59  mg/dL Final   Performed at [place] [address] [place], [place] [address]   WBC 08/02/2021 6.8  4.0 - 10.5 K/uL Final   RBC 08/02/2021 4.37  3.87 - 5.11 MIL/uL Final   Hemoglobin 08/02/2021 11.7 (A) 12.0 - 15.0 g/dL Final   HCT 08/02/2021 37.6  36.0 - 46.0 % Final   MCV 08/02/2021 86.0  80.0 - 100.0 fL Final   MCH 08/02/2021 26.8  26.0 - 34.0 pg Final   MCHC 08/02/2021 31.1  30.0 - 36.0 g/dL Final   RDW 08/02/2021 14.2  11.5 - 15.5 % Final   Platelets 08/02/2021 216  150 - 400 K/uL Final   nRBC 08/02/2021 0.0  0.0 - 0.2 % Final   Performed at [facility], [place] [address], [place], [place] [address]   Sodium 08/02/2021 143  135 - 145 mmol/L Final   Potassium 08/02/2021 4.1  3.5 - 5.1 mmol/L Final   Chloride 08/02/2021 107  98 - 111 mmol/L Final   CO2 08/02/2021 27  22 - 32 mmol/L Final   Glucose, Bld 08/02/2021 117 (A) 70 - 99 mg/dL Final   Glucose reference range applies only to samples taken after fasting for at least 8 hours.   BUN 08/02/2021 22 (A) 6 - 20 mg/dL Final   Creatinine, Ser 08/02/2021 0.78  0.44 - 1.00 mg/dL Final   Calcium 08/02/2021 9.6  8.9 - 10.3 mg/dL Final   Total Protein 08/02/2021 6.8  6.5 - 8.1 g/dL Final   Albumin 08/02/2021 4.1  3.5 - 5.0 g/dL Final   AST 08/02/2021 23  15 - 41 U/L Final   ALT  08/02/2021 19  0 - 44 U/L Final   Alkaline Phosphatase 08/02/2021 30 (A) 38 - 126 U/L Final   Total Bilirubin 08/02/2021 0.6  0.3 - 1.2 mg/dL Final   GFR, Estimated 08/02/2021 >60  >60 mL/min Final   Comment: (NOTE) Calculated using the CKD-EPI Creatinine Equation (2021)    Anion gap 08/02/2021 9  5 - 15 Final   Performed at [facility], [place] [address], [place], [place] [address]   ABO/RH(D) 08/02/2021 A POS   Final   Antibody Screen 08/02/2021 NEG   Final   Sample Expiration 08/02/2021 08/16/2021,2359   Final   Extend sample reason 08/02/2021    Final                   Value:NO TRANSFUSIONS OR PREGNANCY IN THE PAST 3 MONTHS Performed at [facility], [place] [address] [place],  [address]    Glucose-Capillary 08/02/2021 116 (A) 70 - 99 mg/dL Final   Glucose reference range applies only to samples taken after fasting for at least 8 hours.     X-Rays:No results found.  EKG: Orders placed or performed during the hospital encounter of 08/02/21   EKG 12 lead per protocol   EKG 12 lead per protocol     Hospital Course: Veronica Rogers is a 60 y.o. who was admitted to [facility]. They were brought to the operating room on 09/11/2021 and underwent Procedure(s): TOTAL KNEE ARTHROPLASTY.  Patient tolerated the procedure well and was later transferred  to the recovery room and then to the orthopaedic floor for postoperative care. They were given PO and IV analgesics for pain control following their surgery. They were given 24 hours of postoperative antibiotics of  Anti-infectives (From admission, onward)    Start     Dose/Rate Route Frequency Ordered Stop   09/11/21 1500  ceFAZolin (ANCEF) IVPB 2g/100 mL premix        2 g 200 mL/hr over 30 Minutes Intravenous Every 6 hours 09/11/21 1339 09/11/21 2105   09/11/21 0715  ceFAZolin (ANCEF) IVPB 2g/100 mL premix        2 g 200 mL/hr over 30 Minutes Intravenous On call to O.R. 09/11/21 2440  09/11/21 0904   09/11/21 0713  ceFAZolin (ANCEF) 2-4 GM/100ML-% IVPB       Note to Pharmacy: Randa Evens  : cabinet override      09/11/21 0713 09/11/21 0952      and started on DVT prophylaxis in the form of Aspirin.   PT and OT were ordered for total joint protocol. Discharge planning consulted to help with postop disposition and equipment needs.  Patient had a good night on the evening of surgery. They started to get up OOB with therapy on POD #0. Pt was seen during rounds and was ready to go home pending progress with therapy.She worked with therapy on POD #1 and was meeting her goals. Pt was discharged to home later that day in stable condition.  Diet: Regular diet Activity: WBAT Follow-up: in 2 weeks Disposition: Home Discharged Condition: good   Discharge Instructions     Call MD / Call 911   Complete by: As directed    If you experience chest pain or shortness of breath, CALL 911 and be transported to the hospital emergency room.  If you develope a fever above 101 F, pus (white drainage) or increased drainage or redness at the wound, or calf pain, call your surgeon's office.   Change dressing   Complete by: As directed    Maintain surgical dressing until follow up in the clinic. If the edges start to pull up, may reinforce with tape. If the dressing is no longer working, may remove and cover with gauze and tape, but must keep the area dry and clean.  Call with any questions or concerns.   Constipation Prevention   Complete by: As directed    Drink plenty of fluids.  Prune juice may be helpful.  You may use a stool softener, such as Colace (over the counter) 100 mg twice a day.  Use MiraLax (over the counter) for constipation as needed.   Diet - low sodium heart healthy   Complete by: As directed    Increase activity slowly as tolerated   Complete by: As directed    Weight bearing as tolerated with assist device (walker, cane, etc) as directed, use it as long as suggested  by your surgeon or therapist, typically at least 4-6 weeks.   Post-operative opioid taper instructions:   Complete by: As directed    POST-OPERATIVE OPIOID TAPER INSTRUCTIONS: It is important to wean off of your opioid medication as soon as possible. If you do not need pain medication after your surgery it is ok to stop day one. Opioids include: Codeine, Hydrocodone(Norco, Vicodin), Oxycodone(Percocet, oxycontin) and hydromorphone amongst others.  Long term and even short term use of opiods can cause: Increased pain response Dependence Constipation Depression Respiratory depression And more.  Withdrawal symptoms can include Flu like symptoms Nausea,  vomiting And more Techniques to manage these symptoms Hydrate well Eat regular healthy meals Stay active Use relaxation techniques(deep breathing, meditating, yoga) Do Not substitute Alcohol to help with tapering If you have been on opioids for less than two weeks and do not have pain than it is ok to stop all together.  Plan to wean off of opioids This plan should start within one week post op of your joint replacement. Maintain the same interval or time between taking each dose and first decrease the dose.  Cut the total daily intake of opioids by one tablet each day Next start to increase the time between doses. The last dose that should be eliminated is the evening dose.      TED hose   Complete by: As directed    Use stockings (TED hose) for 2 weeks on both leg(s).  You may remove them at night for sleeping.      Allergies as of 09/12/2021       Reactions   Erythromycin Other (See Comments)   Stomach pain   B-complex-b-12 [b Complex Vitamins] Itching, Rash   Itchy skin   Tape Rash        Medication List     STOP taking these medications    Biotin 1000 MCG Chew   ibuprofen 800 MG tablet Commonly known as: ADVIL   meloxicam 7.5 MG tablet Commonly known as: MOBIC       TAKE these medications     albuterol 108 (90 Base) MCG/ACT inhaler Commonly known as: VENTOLIN HFA INHALE 1-2 PUFFS INTO THE LUNGS EVERY 6 (SIX) HOURS AS NEEDED FOR WHEEZING OR SHORTNESS OF BREATH.   Carestart COVID-19 Home Test Kit Generic drug: COVID-19 At Home Antigen Test use as directed   celecoxib 200 MG capsule Commonly known as: CELEBREX Take 1 capsule (200 mg total) by mouth 2 (two) times daily.   cyclobenzaprine 5 MG tablet Commonly known as: FLEXERIL Take 1 tablet (5 mg total) by mouth 2 (two) times daily as needed for muscle spasms.   docusate sodium 100 MG capsule Commonly known as: COLACE Take 1 capsule (100 mg total) by mouth 2 (two) times daily.   fenofibrate 48 MG tablet Commonly known as: TRICOR TAKE 1 TABLET (48 MG TOTAL) BY MOUTH DAILY.   freestyle lancets UAD for bid blood glucose testing Dx: E11.65   FREESTYLE LITE test strip Generic drug: glucose blood Use as directed twice daily for glucose testing   glipiZIDE 5 MG tablet Commonly known as: GLUCOTROL TAKE 1 TO 2 TABLETS BY MOUTH DAILY WITH EVENING MEAL   glucose monitoring kit monitoring kit 1 each by Does not apply route as needed for other. UAD for bid blood glucose testing Dx: E11.65   metFORMIN 750 MG 24 hr tablet Commonly known as: GLUCOPHAGE-XR TAKE 2 TABLETS BY MOUTH DAILY AT BEDTIME   methocarbamol 500 MG tablet Commonly known as: ROBAXIN Take 1 tablet (500 mg total) by mouth every 6 (six) hours as needed for muscle spasms.   oxybutynin 10 MG 24 hr tablet Commonly known as: DITROPAN-XL Take 10 mg by mouth every morning.   oxybutynin 10 MG 24 hr tablet Commonly known as: DITROPAN-XL TAKE 1 TABLET EVERY DAY BY MOUTH IN THE MORNING   oxyCODONE 5 MG immediate release tablet Commonly known as: Oxy IR/ROXICODONE Take 1-2 tablets (5-10 mg total) by mouth every 4 (four) hours as needed for severe pain.   polyethylene glycol 17 g packet Commonly known as: MIRALAX / GLYCOLAX  Take 17 g by mouth daily as  needed for mild constipation.   Rybelsus 7 MG Tabs Generic drug: Semaglutide TAKE 1 TABLET BY MOUTH ONCE DAILY BEFORE BREAKFAST.   sertraline 50 MG tablet Commonly known as: ZOLOFT Take 1 tablet (50 mg total) by mouth daily.   tamoxifen 20 MG tablet Commonly known as: NOLVADEX TAKE 1 TABLET BY MOUTH DAILY.   Xarelto 10 MG Tabs tablet Generic drug: rivaroxaban Take 1 tablet (10 mg total) by mouth daily for 14 days. Then take aspirin 81 mg twice a day for another month.               Discharge Care Instructions  (From admission, onward)           Start     Ordered   09/12/21 0000  Change dressing       Comments: Maintain surgical dressing until follow up in the clinic. If the edges start to pull up, may reinforce with tape. If the dressing is no longer working, may remove and cover with gauze and tape, but must keep the area dry and clean.  Call with any questions or concerns.   09/12/21 1975            Follow-up Information     Paralee Cancel, MD. Schedule an appointment as soon as possible for a visit in 2 week(s).   Specialty: Orthopedic Surgery Contact information: [address] [place] [address] [phone]                 Signed: Griffith Citron, PA-C Orthopedic Surgery 09/26/2021, 12:24 PM

## 2021-09-27 ENCOUNTER — Other Ambulatory Visit (HOSPITAL_COMMUNITY): Payer: Self-pay

## 2021-09-28 ENCOUNTER — Other Ambulatory Visit: Payer: Self-pay

## 2021-09-28 ENCOUNTER — Ambulatory Visit: Payer: No Typology Code available for payment source

## 2021-09-28 DIAGNOSIS — M6281 Muscle weakness (generalized): Secondary | ICD-10-CM

## 2021-09-28 DIAGNOSIS — M25561 Pain in right knee: Secondary | ICD-10-CM | POA: Diagnosis not present

## 2021-09-28 DIAGNOSIS — G8929 Other chronic pain: Secondary | ICD-10-CM

## 2021-09-28 DIAGNOSIS — R2681 Unsteadiness on feet: Secondary | ICD-10-CM

## 2021-09-28 DIAGNOSIS — M25661 Stiffness of right knee, not elsewhere classified: Secondary | ICD-10-CM

## 2021-09-28 DIAGNOSIS — M25461 Effusion, right knee: Secondary | ICD-10-CM

## 2021-09-28 NOTE — Patient Instructions (Signed)
   [id number]

## 2021-09-28 NOTE — Therapy (Signed)
[place], [place], [address] Phone: [phone]   Fax:  [phone]  Physical Therapy Treatment  Patient Details  Name: JAANAI SALEMI MRN: [id number] Date of Birth: 1961/10/09 Referring Provider (PT): Irving Copas, [place]   Encounter Date: 09/28/2021   PT End of Session - 09/28/21 1127     Visit Number 6    Number of Visits 17    Date for PT Re-Evaluation 11/08/21    Authorization Type [employer]    PT Start Time 4193    PT Stop Time 1130    PT Time Calculation (min) 45 min    Activity Tolerance Patient tolerated treatment well;Patient limited by pain    Behavior During Therapy [facility] for tasks assessed/performed             Past Medical History:  Diagnosis Date   Arthritis    Breast cancer ([place])    Contact lens/glasses fitting    wears contacts or glasses   Diabetes ([place]) 11/22/2014   GERD (gastroesophageal reflux disease)    Hyperlipidemia    Personal history of chemotherapy    Personal history of radiation therapy    Radiation 03/28/14-05/06/14   Left Breast 60 Gy    Past Surgical History:  Procedure Laterality Date   BREAST BIOPSY  1996   right breast, benign   BREAST LUMPECTOMY WITH NEEDLE LOCALIZATION AND AXILLARY SENTINEL LYMPH NODE BX Left 10/29/2013   Procedure: BREAST LUMPECTOMY WITH NEEDLE LOCALIZATION AND AXILLARY SENTINEL LYMPH NODE BX;  Surgeon: Rolm Bookbinder, MD;  Location: [place];  Service: General;  Laterality: Left;   COLONOSCOPY  05/2011   WNL   TOTAL KNEE ARTHROPLASTY Right 09/11/2021   Procedure: TOTAL KNEE ARTHROPLASTY;  Surgeon: Paralee Cancel, MD;  Location: [facility];  Service: Orthopedics;  Laterality: Right;   WISDOM TOOTH EXTRACTION      There were no vitals filed for this visit.   Subjective Assessment - 09/28/21 1051     Subjective Pt reports that her orthopedic doctor told her she is doing very well, adding that he hopes she will  achieve 120 degrees of flexion and 0 degrees of extension. The pt reports mild pain over her surgical site today, although she reports overall feeling well. She has continued to do her HEP.    Currently in Pain? Yes    Pain Score 1     Pain Location Knee    Pain Orientation Right    Pain Descriptors / Indicators Aching;Sore    Pain Type Surgical pain    Pain Onset 1 to 4 weeks ago    Pain Frequency Intermittent                               [facility] Adult PT Treatment/Exercise - 09/28/21 0001       Knee/Hip Exercises: Aerobic   Stationary Bike x5 minutes, no resistance, while collecting subjective hx      Knee/Hip Exercises: Machines for Strengthening   Cybex Knee Extension 5# double leg with slow lowering 3x10    Total Gym Leg Press 35# 3x10      Knee/Hip Exercises: Prone   Hamstring Curl 3 sets;10 reps    Hamstring Curl Limitations 4# ankle weights      Manual Therapy   Manual Therapy Soft tissue mobilization    Soft tissue mobilization Scar tissue mobilization with education for pt on how to  perform                     PT Education - 09/28/21 1127     Education Details Updated HEP, discussed scar tissue mobilization tehniques with pt.    Person(s) Educated Patient    Methods Explanation;Demonstration;Handout    Comprehension Verbalized understanding;Returned demonstration              PT Short Term Goals - 09/28/21 1130       PT SHORT TERM GOAL #1   Title Pt will report understanding and adherence to her HEP in order to promote independence in the management of her primaryt sxs.    Baseline Pt is adherent to HEP    Time 4    Period Weeks    Status Achieved    Target Date 10/11/21      PT SHORT TERM GOAL #2   Title Pt will report 50% decrease in swelling in order to achieve further R knee functional ROM.    Baseline 75% improved swelling per pt (09/28/2021)    Time 4    Period Weeks    Status Achieved    Target Date 10/11/21       PT SHORT TERM GOAL #3   Title Pt will complete a long arc quad with no quad lag in order to progress to closed-chain LE strengthening exercises.    Baseline Achieved    Time 4    Period Weeks    Status Achieved    Target Date 10/11/21               PT Long Term Goals - 09/28/21 1131       PT LONG TERM GOAL #1   Title Pt will demonstrate the ability to ambulate a 6MWT without an AD or seated rest in order to return to walking 5K's without limitation.    Baseline WBAT, utilizing FWW for gait, antalgic gait pattern    Time 8    Period Weeks    Status On-going      PT LONG TERM GOAL #2   Title Pt will achieve a FOTO score of 45% in order to demonstrate improved functional ability as it relates to her knee sxs.    Baseline 10%, 50% (09/28/2021)    Time 8    Period Weeks    Status Achieved      PT LONG TERM GOAL #3   Title Pt will achieve R knee extension AROM of 5-0 degrees in order to achieve a normal gait pattern.    Baseline 33 degrees    Time 8    Period Weeks    Status On-going      PT LONG TERM GOAL #4   Title Pt will achieve R knee flexion AROM of 125 degrees or greater in order to sit upright at work without limitation.    Baseline 50 degrees    Time 8    Period Weeks    Status On-going      PT LONG TERM GOAL #5   Title Pt will achieve R knee flexion and extension MMT of 5/5 in order to progress her independent LE strengthening program.    Baseline 1/5    Time 8    Period Weeks    Status On-going                   Plan - 09/28/21 1129     Clinical Impression Statement Pt responded well to  all interventions, demonstrating the ability to perform all exercises with good form and no increase in pain. She continues to demonstrate improved quadriceps strength in functional lifts and also demonstrates improved FOTO score. She will continue to benefit from skilled PT to address her primary impairments and return to her prior level of function without  limitation.    Personal Factors and Comorbidities Comorbidity 3+    Comorbidities T2DM, hyperlipidemia, hx of Cx    Examination-Activity Limitations Bend;Locomotion Level;Transfers;Sit;Bed Mobility;Squat;Stairs;Stand;Lift;Dressing    Examination-Participation Restrictions Occupation;Cleaning;Community Activity;Shop    Stability/Clinical Decision Making Stable/Uncomplicated    Clinical Decision Making Low    Rehab Potential Excellent    PT Frequency 2x / week    PT Duration 8 weeks    PT Treatment/Interventions ADLs/Self Care Home Management;Cryotherapy;Gait training;Stair training;Functional mobility training;Therapeutic activities;Therapeutic exercise;Balance training;Neuromuscular re-education;Manual techniques;Passive range of motion;Scar mobilization;Patient/family education;Manual lymph drainage;Compression bandaging;Vasopneumatic Device;Taping    PT Next Visit Plan assess balance as able, progress early quadriceps strengthening, R knee AAROM, utilize [employer] Knee TKA protocol    PT Home Exercise Plan [place] and Agree with Plan of Care Patient             Patient will benefit from skilled therapeutic intervention in order to improve the following deficits and impairments:  Abnormal gait, Decreased range of motion, Difficulty walking, Decreased activity tolerance, Pain, Impaired flexibility, Hypomobility, Decreased balance, Decreased strength, Decreased mobility  Visit Diagnosis: Chronic pain of right knee  Swelling of joint of right knee  Stiffness of right knee, not elsewhere classified  Unsteadiness on feet  Muscle weakness (generalized)     Problem List Patient Active Problem List   Diagnosis Date Noted   S/P total knee arthroplasty, right 09/11/2021   Pre-op evaluation 07/17/2021   Wound of abdomen 05/04/2021   Carpal tunnel syndrome of left wrist 10/13/2020   Chronic pain of right knee 10/13/2020   Healthcare  maintenance 09/13/2019   OA (osteoarthritis) of knee 11/11/2018   Hyperlipidemia 08/26/2017   Type 2 diabetes mellitus with hyperglycemia, without long-term current use of insulin ([place]) 01/08/2016   Allergic rhinitis 11/22/2014   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[place]) 10/14/2013    Vanessa Hunter, PT, DPT 09/28/21 11:34 AM   [facility] [address] [place], [place], [address] Phone: [phone]   Fax:  [phone]  Name: BLANDINA RENALDO MRN: [id number] Date of Birth: 1961/02/20

## 2021-10-01 ENCOUNTER — Ambulatory Visit: Payer: No Typology Code available for payment source

## 2021-10-01 NOTE — Progress Notes (Signed)
 Patient Care Team: Kremer, William Alfred, MD as PCP - General (Family Medicine)  DIAGNOSIS:    ICD-10-CM   1. Malignant neoplasm of upper-inner quadrant of left breast in female, estrogen receptor positive (HCC)  C50.212    Z17.0     2. 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HCC)  C50.212 tamoxifen (NOLVADEX) 20 MG tablet      SUMMARY OF ONCOLOGIC HISTORY: Oncology History  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HCC)  10/29/2013 Surgery   left lumpectomy: 0.9 cm invasive ductal carcinoma, grade 2, ER/PR positive HER-2 negative, 1 sentinel node positive for micrometastatic, Oncotype DX recurrence score 29, 17% ROR   12/07/2013 - 02/08/2014 Chemotherapy   Taxotere and Cytoxan 4 adjuvant chemotherapy   03/30/2014 - 05/06/2014 Radiation Therapy   adjuvant radiation therapy   05/20/2014 -  Anti-estrogen oral therapy   tamoxifen 20 mg daily     CHIEF COMPLIANT: Follow-up of left breast cancer on tamoxifen therapy  INTERVAL HISTORY: Veronica Rogers is a 60 y.o. with above-mentioned history of left breast cancer treated with lumpectomy, followed by adjuvant chemotherapy, and radiation, and who is currently on antiestrogen therapy with tamoxifen. Mammogram on 06/14/2021 showed no evidence of malignancy bilaterally. She presents to the clinic today for annual follow-up.  She had knee replacement surgery couple of weeks ago and is going through physical therapy and recovering from that.  She is tolerating tamoxifen extremely well without any major problems or concerns.  ALLERGIES:  is allergic to erythromycin, b-complex-b-12 [b complex vitamins], and tape.  MEDICATIONS:  Current Outpatient Medications  Medication Sig Dispense Refill   albuterol (VENTOLIN HFA) 108 (90 Base) MCG/ACT inhaler INHALE 1-2 PUFFS INTO THE LUNGS EVERY 6 (SIX) HOURS AS NEEDED FOR WHEEZING OR SHORTNESS OF BREATH. 18 g 1   celecoxib (CELEBREX) 200 MG capsule Take 1 capsule  (200 mg total) by mouth 2 (two) times daily. 60 capsule 0   COVID-19 At Home Antigen Test (CARESTART COVID-19 HOME TEST) KIT use as directed 4 each 0   cyclobenzaprine (FLEXERIL) 5 MG tablet Take 1 tablet (5 mg total) by mouth 2 (two) times daily as needed for muscle spasms. 30 tablet 0   fenofibrate (TRICOR) 48 MG tablet TAKE 1 TABLET (48 MG TOTAL) BY MOUTH DAILY. 90 tablet 3   glipiZIDE (GLUCOTROL) 5 MG tablet TAKE 1 TO 2 TABLETS BY MOUTH DAILY WITH EVENING MEAL 180 tablet 0   glucose blood (FREESTYLE LITE) test strip Use as directed twice daily for glucose testing 100 each 12   glucose monitoring kit (FREESTYLE) monitoring kit 1 each by Does not apply route as needed for other. UAD for bid blood glucose testing Dx: E11.65 1 each 1   hydrocortisone-pramoxine (ANALPRAM-HC) 2.5-1 % rectal cream Apply up to twice daily 30 g 3   Lancets (FREESTYLE) lancets UAD for bid blood glucose testing Dx: E11.65 100 each 12   Lidocaine-Hydrocort, Perianal, 3-0.5 % CREA Apply as needed up to twice daily. 98 g 3   metFORMIN (GLUCOPHAGE-XR) 750 MG 24 hr tablet TAKE 2 TABLETS BY MOUTH DAILY AT BEDTIME 180 tablet 3   oxybutynin (DITROPAN-XL) 10 MG 24 hr tablet TAKE 1 TABLET EVERY DAY BY MOUTH IN THE MORNING 90 tablet 3   pregabalin (LYRICA) 75 MG capsule Take 1 capsule (75 mg total) by mouth 2 (two) times daily. 60 capsule 1   Semaglutide (RYBELSUS) 7 MG TABS TAKE 1 TABLET BY MOUTH ONCE DAILY BEFORE BREAKFAST. 90   tablet 3   sertraline (ZOLOFT) 50 MG tablet Take 1 tablet (50 mg total) by mouth daily. 90 tablet 3   tamoxifen (NOLVADEX) 20 MG tablet TAKE 1 TABLET BY MOUTH DAILY. 90 tablet 3   No current facility-administered medications for this visit.    PHYSICAL EXAMINATION: ECOG PERFORMANCE STATUS: 1 - Symptomatic but completely ambulatory  Vitals:   10/02/21 0917  BP: (!) 144/76  Pulse: 82  Resp: 18  Temp: (!) 97.5 F (36.4 C)  SpO2: 99%   Filed Weights   10/02/21 0917  Weight: 191 lb (86.6 kg)     BREAST: No palpable masses or nodules in either right or left breasts. No palpable axillary supraclavicular or infraclavicular adenopathy no breast tenderness or nipple discharge. (exam performed in the presence of a chaperone)  LABORATORY DATA:  I have reviewed the data as listed CMP Latest Ref Rng & Units 09/12/2021 09/04/2021 08/02/2021  Glucose 70 - 99 mg/dL 173(H) 229(H) 117(H)  BUN 6 - 20 mg/dL 17 15 22(H)  Creatinine 0.44 - 1.00 mg/dL 0.74 0.75 0.78  Sodium 135 - 145 mmol/L 142 139 143  Potassium 3.5 - 5.1 mmol/L 4.3 3.8 4.1  Chloride 98 - 111 mmol/L 107 106 107  CO2 22 - 32 mmol/L 28 25 27  Calcium 8.9 - 10.3 mg/dL 9.4 9.7 9.6  Total Protein 6.5 - 8.1 g/dL - - 6.8  Total Bilirubin 0.3 - 1.2 mg/dL - - 0.6  Alkaline Phos 38 - 126 U/L - - 30(L)  AST 15 - 41 U/L - - 23  ALT 0 - 44 U/L - - 19    Lab Results  Component Value Date   WBC 10.9 (H) 09/12/2021   HGB 8.7 (L) 09/12/2021   HCT 27.8 (L) 09/12/2021   MCV 85.8 09/12/2021   PLT 189 09/12/2021   NEUTROABS 3.4 09/13/2019    ASSESSMENT & PLAN:  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HCC) 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  Denies any hot flashes or myalgias.   Plan of treatment: 10 years of tamoxifen.  If she wants to stop sooner than we will consider doing breast cancer index    Small skin lesion medial aspect of the left breast: Has remained the same.  She might see dermatologist to discuss removing it.  Breast cancer surveillance: 1.  Mammogram 06/14/2021 benign, breast density category B 2.  Breast exam  10/02/2021: Benign  Return to clinic in 1 year for follow-up   No orders of the defined types were placed in this encounter.  The patient has a good understanding of the overall plan. she agrees with it. she will call with any problems that may develop before the next visit here.  Total time spent: 20 mins including face to face time and time spent for planning, charting and coordination of care  Vinay K Gudena, MD, MPH 10/02/2021  I, Kirstyn Evans, am acting as scribe for Dr. Vinay Gudena.  I have reviewed the above documentation for accuracy and completeness, and I agree with the above.       

## 2021-10-02 ENCOUNTER — Inpatient Hospital Stay
Payer: No Typology Code available for payment source | Attending: Hematology and Oncology | Admitting: Hematology and Oncology

## 2021-10-02 ENCOUNTER — Other Ambulatory Visit: Payer: Self-pay

## 2021-10-02 ENCOUNTER — Other Ambulatory Visit (HOSPITAL_COMMUNITY): Payer: Self-pay

## 2021-10-02 ENCOUNTER — Encounter: Payer: Self-pay | Admitting: Family Medicine

## 2021-10-02 DIAGNOSIS — C50212 Malignant neoplasm of upper-inner quadrant of left female breast: Secondary | ICD-10-CM | POA: Diagnosis not present

## 2021-10-02 DIAGNOSIS — Z79810 Long term (current) use of selective estrogen receptor modulators (SERMs): Secondary | ICD-10-CM | POA: Diagnosis not present

## 2021-10-02 DIAGNOSIS — Z170 Estrogen receptor positive status [ER+]: Secondary | ICD-10-CM | POA: Insufficient documentation

## 2021-10-02 DIAGNOSIS — Z9221 Personal history of antineoplastic chemotherapy: Secondary | ICD-10-CM | POA: Insufficient documentation

## 2021-10-02 DIAGNOSIS — Z923 Personal history of irradiation: Secondary | ICD-10-CM | POA: Diagnosis not present

## 2021-10-02 DIAGNOSIS — L989 Disorder of the skin and subcutaneous tissue, unspecified: Secondary | ICD-10-CM | POA: Diagnosis not present

## 2021-10-02 MED ORDER — TAMOXIFEN CITRATE 20 MG PO TABS
ORAL_TABLET | Freq: Every day | ORAL | 3 refills | Status: DC
Start: 2021-10-02 — End: 2022-10-01
  Filled 2021-10-02 – 2021-10-30 (×2): qty 90, 90d supply, fill #0
  Filled 2022-01-21: qty 10, 10d supply, fill #1
  Filled 2022-01-21: qty 80, 80d supply, fill #1
  Filled 2022-04-30: qty 90, 90d supply, fill #2
  Filled 2022-07-22: qty 90, 90d supply, fill #3

## 2021-10-02 NOTE — Assessment & Plan Note (Signed)
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 Occasional hot flashes  Plan of treatment: 10 years of tamoxifen.  If she wants to stop sooner than we will consider doing breast cancer index  Breast cancer surveillance: 1. Mammogram 06/14/2021 benign, breast density category B 2. Breast exam10/03/2021: Benign Return to clinic in 1 year for follow-up

## 2021-10-03 ENCOUNTER — Ambulatory Visit: Payer: No Typology Code available for payment source | Attending: Student

## 2021-10-03 DIAGNOSIS — M25461 Effusion, right knee: Secondary | ICD-10-CM | POA: Insufficient documentation

## 2021-10-03 DIAGNOSIS — G8929 Other chronic pain: Secondary | ICD-10-CM | POA: Insufficient documentation

## 2021-10-03 DIAGNOSIS — M6281 Muscle weakness (generalized): Secondary | ICD-10-CM | POA: Insufficient documentation

## 2021-10-03 DIAGNOSIS — M25661 Stiffness of right knee, not elsewhere classified: Secondary | ICD-10-CM | POA: Insufficient documentation

## 2021-10-03 DIAGNOSIS — M25561 Pain in right knee: Secondary | ICD-10-CM | POA: Insufficient documentation

## 2021-10-03 DIAGNOSIS — R2681 Unsteadiness on feet: Secondary | ICD-10-CM | POA: Diagnosis present

## 2021-10-03 NOTE — Therapy (Signed)
[place], [place], [address] Phone: [phone]   Fax:  [phone]  Physical Therapy Treatment  Patient Details  Name: Veronica Rogers MRN: [id number] Date of Birth: 05/10/1961 Referring Provider (PT): Irving Copas, [place]   Encounter Date: 10/03/2021   PT End of Session - 10/03/21 1202     Visit Number 7    Number of Visits 17    Date for PT Re-Evaluation 11/08/21    Authorization Type [employer]    PT Start Time 1130    PT Stop Time 3646    PT Time Calculation (min) 45 min    Equipment Utilized During Treatment Gait belt    Activity Tolerance Patient tolerated treatment well;Patient limited by pain    Behavior During Therapy [facility] for tasks assessed/performed             Past Medical History:  Diagnosis Date   Arthritis    Breast cancer ([place])    Contact lens/glasses fitting    wears contacts or glasses   Diabetes ([place]) 11/22/2014   GERD (gastroesophageal reflux disease)    Hyperlipidemia    Personal history of chemotherapy    Personal history of radiation therapy    Radiation 03/28/14-05/06/14   Left Breast 60 Gy    Past Surgical History:  Procedure Laterality Date   BREAST BIOPSY  1996   right breast, benign   BREAST LUMPECTOMY WITH NEEDLE LOCALIZATION AND AXILLARY SENTINEL LYMPH NODE BX Left 10/29/2013   Procedure: BREAST LUMPECTOMY WITH NEEDLE LOCALIZATION AND AXILLARY SENTINEL LYMPH NODE BX;  Surgeon: Rolm Bookbinder, MD;  Location: [facility];  Service: General;  Laterality: Left;   COLONOSCOPY  05/2011   WNL   TOTAL KNEE ARTHROPLASTY Right 09/11/2021   Procedure: TOTAL KNEE ARTHROPLASTY;  Surgeon: Paralee Cancel, MD;  Location[facility];  Service: Orthopedics;  Laterality: Right;   WISDOM TOOTH EXTRACTION      There were no vitals filed for this visit.   Subjective Assessment - 10/03/21 1140     Subjective Pt reports mild pain today, adding that she had some  difficultywalking over the weekend while shopping. She reports that she has continued to experience "nerve pain" in her R knee. She reports continued HEP adherence.    Currently in Pain? Yes    Pain Score 3     Pain Location Knee    Pain Orientation Right    Pain Descriptors / Indicators Aching;Sore    Pain Type Surgical pain    Pain Onset 1 to 4 weeks ago    Pain Frequency Intermittent                               [facility] Adult PT Treatment/Exercise - 10/03/21 0001       Knee/Hip Exercises: Aerobic   Stationary Bike x5 minutes, no resistance, while collecting subjective hx      Knee/Hip Exercises: Machines for Strengthening   Total Gym Leg Press 45# 3x10    Hip Cybex hip abduction and extension 2x10 BIL with 25#      Knee/Hip Exercises: Standing   SLS On Airex pad 3x30sec on R    Rebounder 3x15 SL stance on R with 1kg ball toss                       PT Short Term Goals - 09/28/21 1130  PT SHORT TERM GOAL #1   Title Pt will report understanding and adherence to her HEP in order to promote independence in the management of her primaryt sxs.    Baseline Pt is adherent to HEP    Time 4    Period Weeks    Status Achieved    Target Date 10/11/21      PT SHORT TERM GOAL #2   Title Pt will report 50% decrease in swelling in order to achieve further R knee functional ROM.    Baseline 75% improved swelling per pt (09/28/2021)    Time 4    Period Weeks    Status Achieved    Target Date 10/11/21      PT SHORT TERM GOAL #3   Title Pt will complete a long arc quad with no quad lag in order to progress to closed-chain LE strengthening exercises.    Baseline Achieved    Time 4    Period Weeks    Status Achieved    Target Date 10/11/21               PT Long Term Goals - 09/28/21 1131       PT LONG TERM GOAL #1   Title Pt will demonstrate the ability to ambulate a 6MWT without an AD or seated rest in order to return to walking 5K's  without limitation.    Baseline WBAT, utilizing FWW for gait, antalgic gait pattern    Time 8    Period Weeks    Status On-going      PT LONG TERM GOAL #2   Title Pt will achieve a FOTO score of 45% in order to demonstrate improved functional ability as it relates to her knee sxs.    Baseline 10%, 50% (09/28/2021)    Time 8    Period Weeks    Status Achieved      PT LONG TERM GOAL #3   Title Pt will achieve R knee extension AROM of 5-0 degrees in order to achieve a normal gait pattern.    Baseline 33 degrees    Time 8    Period Weeks    Status On-going      PT LONG TERM GOAL #4   Title Pt will achieve R knee flexion AROM of 125 degrees or greater in order to sit upright at work without limitation.    Baseline 50 degrees    Time 8    Period Weeks    Status On-going      PT LONG TERM GOAL #5   Title Pt will achieve R knee flexion and extension MMT of 5/5 in order to progress her independent LE strengthening program.    Baseline 1/5    Time 8    Period Weeks    Status On-going                   Plan - 10/03/21 1203     Clinical Impression Statement Pt responded well to all interventions, demonstrating the ability to perform all exercises with good form and no increase in pain. She continues to demonstrate improved quadriceps and hip strength in functional lifts. She declines vasopneumatic treatment today due to feeling well after treatment. She will continue to benefit from skilled PT to address her primary impairments and return to her prior level of function without limitation.    Personal Factors and Comorbidities Comorbidity 3+    Comorbidities T2DM, hyperlipidemia, hx of Cx    Examination-Activity  Limitations Bend;Locomotion Level;Transfers;Sit;Bed Mobility;Squat;Stairs;Stand;Lift;Dressing    Examination-Participation Restrictions Occupation;Cleaning;Community Activity;Shop    Stability/Clinical Decision Making Stable/Uncomplicated    Clinical Decision Making Low     Rehab Potential Excellent    PT Frequency 2x / week    PT Duration 8 weeks    PT Treatment/Interventions ADLs/Self Care Home Management;Cryotherapy;Gait training;Stair training;Functional mobility training;Therapeutic activities;Therapeutic exercise;Balance training;Neuromuscular re-education;Manual techniques;Passive range of motion;Scar mobilization;Patient/family education;Manual lymph drainage;Compression bandaging;Vasopneumatic Device;Taping    PT Next Visit Plan assess balance as able, progress early quadriceps strengthening, R knee AAROM, utilize [employer] Knee TKA protocol    PT Home Exercise Plan [place] and Agree with Plan of Care Patient             Patient will benefit from skilled therapeutic intervention in order to improve the following deficits and impairments:  Abnormal gait, Decreased range of motion, Difficulty walking, Decreased activity tolerance, Pain, Impaired flexibility, Hypomobility, Decreased balance, Decreased strength, Decreased mobility  Visit Diagnosis: Chronic pain of right knee  Swelling of joint of right knee  Stiffness of right knee, not elsewhere classified  Unsteadiness on feet  Muscle weakness (generalized)     Problem List Patient Active Problem List   Diagnosis Date Noted   S/P total knee arthroplasty, right 09/11/2021   Pre-op evaluation 07/17/2021   Wound of abdomen 05/04/2021   Carpal tunnel syndrome of left wrist 10/13/2020   Chronic pain of right knee 10/13/2020   Healthcare maintenance 09/13/2019   OA (osteoarthritis) of knee 11/11/2018   Hyperlipidemia 08/26/2017   Type 2 diabetes mellitus with hyperglycemia, without long-term current use of insulin ([place]) 01/08/2016   Allergic rhinitis 11/22/2014   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[place]) 10/14/2013    Vanessa Zelienople, PT, DPT 10/03/21 12:15 PM   [facility] [address] [place], [place], [address] Phone: [phone]   Fax:  [phone]  Name: Veronica Rogers MRN: [id number] Date of Birth: December 29, 1961

## 2021-10-04 ENCOUNTER — Other Ambulatory Visit (HOSPITAL_COMMUNITY): Payer: Self-pay

## 2021-10-08 ENCOUNTER — Ambulatory Visit: Payer: No Typology Code available for payment source

## 2021-10-10 ENCOUNTER — Ambulatory Visit: Payer: No Typology Code available for payment source

## 2021-10-10 ENCOUNTER — Other Ambulatory Visit: Payer: Self-pay

## 2021-10-10 DIAGNOSIS — M25461 Effusion, right knee: Secondary | ICD-10-CM

## 2021-10-10 DIAGNOSIS — M25561 Pain in right knee: Secondary | ICD-10-CM

## 2021-10-10 DIAGNOSIS — G8929 Other chronic pain: Secondary | ICD-10-CM

## 2021-10-10 DIAGNOSIS — R2681 Unsteadiness on feet: Secondary | ICD-10-CM

## 2021-10-10 DIAGNOSIS — M25661 Stiffness of right knee, not elsewhere classified: Secondary | ICD-10-CM

## 2021-10-10 DIAGNOSIS — M6281 Muscle weakness (generalized): Secondary | ICD-10-CM

## 2021-10-10 NOTE — Therapy (Signed)
[place], [place], [address] Phone: [phone]   Fax:  [phone]  Physical Therapy Treatment  Patient Details  Name: Veronica Rogers MRN: [id number] Date of Birth: 1961-10-10 Referring Provider (PT): Irving Copas, [place]   Encounter Date: 10/10/2021   PT End of Session - 10/10/21 1204     Visit Number 8    Number of Visits 17    Date for PT Re-Evaluation 11/08/21    Authorization Type [employer]    PT Start Time 1130    PT Stop Time 2725    PT Time Calculation (min) 45 min    Activity Tolerance Patient tolerated treatment well    Behavior During Therapy [facility] for tasks assessed/performed             Past Medical History:  Diagnosis Date   Arthritis    Breast cancer ([place])    Contact lens/glasses fitting    wears contacts or glasses   Diabetes ([place]) 11/22/2014   GERD (gastroesophageal reflux disease)    Hyperlipidemia    Personal history of chemotherapy    Personal history of radiation therapy    Radiation 03/28/14-05/06/14   Left Breast 60 Gy    Past Surgical History:  Procedure Laterality Date   BREAST BIOPSY  1996   right breast, benign   BREAST LUMPECTOMY WITH NEEDLE LOCALIZATION AND AXILLARY SENTINEL LYMPH NODE BX Left 10/29/2013   Procedure: BREAST LUMPECTOMY WITH NEEDLE LOCALIZATION AND AXILLARY SENTINEL LYMPH NODE BX;  Surgeon: Rolm Bookbinder, MD;  Location: [place];  Service: General;  Laterality: Left;   COLONOSCOPY  05/2011   WNL   TOTAL KNEE ARTHROPLASTY Right 09/11/2021   Procedure: TOTAL KNEE ARTHROPLASTY;  Surgeon: Paralee Cancel, MD;  [facility];  Service: Orthopedics;  Laterality: Right;   WISDOM TOOTH EXTRACTION      There were no vitals filed for this visit.   Subjective Assessment - 10/10/21 1130     Subjective Pt reports no pain today, adding that she has continued to do her exercises daily. She agrees to the plan to transition to  1x/week in PT.    Currently in Pain? No/denies    Pain Score 0-No pain                [facility] PT Assessment - 10/10/21 0001       Observation/Other Assessments   Focus on Therapeutic Outcomes (FOTO)  50% (09/28/2021)      AROM   Right Knee Extension 10    Right Knee Flexion 118      Strength   Right Knee Flexion 5/5    Right Knee Extension 5/5                           [facility] Adult PT Treatment/Exercise - 10/10/21 0001       Knee/Hip Exercises: Aerobic   Stationary Bike x5 minutes, level 3 resistance, while collecting subjective hx      Knee/Hip Exercises: Standing   Forward Step Up Limitations Step up with knee drive on 6" box 3G64 BIL    Other Standing Knee Exercises 10# KB swings 3x15    Other Standing Knee Exercises SL squat to table with double leg stand 3x8      Knee/Hip Exercises: Supine   Bridges Limitations Bridge with walkouts 3x8      Knee/Hip Exercises: Prone   Hamstring Curl 3 sets;10 reps  Hamstring Curl Limitations 5# ankle weights    Prone Knee Hang Weights (lbs) 5    Prone Knee Hang Limitations 3x1 minute in between hamstring curl sets                       PT Short Term Goals - 09/28/21 1130       PT SHORT TERM GOAL #1   Title Pt will report understanding and adherence to her HEP in order to promote independence in the management of her primaryt sxs.    Baseline Pt is adherent to HEP    Time 4    Period Weeks    Status Achieved    Target Date 10/11/21      PT SHORT TERM GOAL #2   Title Pt will report 50% decrease in swelling in order to achieve further R knee functional ROM.    Baseline 75% improved swelling per pt (09/28/2021)    Time 4    Period Weeks    Status Achieved    Target Date 10/11/21      PT SHORT TERM GOAL #3   Title Pt will complete a long arc quad with no quad lag in order to progress to closed-chain LE strengthening exercises.    Baseline Achieved    Time 4    Period Weeks    Status  Achieved    Target Date 10/11/21               PT Long Term Goals - 10/10/21 1213       PT LONG TERM GOAL #1   Title Pt will demonstrate the ability to ambulate a 6MWT without an AD or seated rest in order to return to walking 5K's without limitation.    Baseline WBAT, utilizing FWW for gait, antalgic gait pattern    Time 8    Period Weeks    Status On-going      PT LONG TERM GOAL #2   Title Pt will achieve a FOTO score of 45% in order to demonstrate improved functional ability as it relates to her knee sxs.    Baseline 10%, 50% (09/28/2021)    Time 8    Period Weeks    Status Achieved      PT LONG TERM GOAL #3   Title Pt will achieve R knee extension AROM of 5-0 degrees in order to achieve a normal gait pattern.    Baseline 33 degrees; 10 degrees (10/10/2021)    Time 8    Period Weeks    Status On-going      PT LONG TERM GOAL #4   Title Pt will achieve R knee flexion AROM of 125 degrees or greater in order to sit upright at work without limitation.    Baseline 50 degrees; 118 degrees (10/10/2021)    Time 8    Period Weeks    Status On-going      PT LONG TERM GOAL #5   Title Pt will achieve R knee flexion and extension MMT of 5/5 in order to progress her independent LE strengthening program.    Baseline 1/5; 5/5 (10/10/2021)    Time 8    Period Weeks    Status Achieved                   Plan - 10/10/21 1205     Clinical Impression Statement Pt responded well to treatment today with good form and no increase in pain with selected exercises.  She has made excellent progress in R knee flexion AROM and knee strength, although she demonstrates 10 degree limitation in knee extension AROM. This was addressed today with prone weighted knee hangs, to which the pt reports discomfort, but tolerated; her pain returned to baseline following the intervention. She will benefit from continued PT to address her remaining impairments and return to her prior level of function  without limitation.    Personal Factors and Comorbidities Comorbidity 3+    Comorbidities T2DM, hyperlipidemia, hx of Cx    Examination-Activity Limitations Bend;Locomotion Level;Transfers;Sit;Bed Mobility;Squat;Stairs;Stand;Lift;Dressing    Examination-Participation Restrictions Occupation;Cleaning;Community Activity;Shop    Stability/Clinical Decision Making Stable/Uncomplicated    Clinical Decision Making Low    Rehab Potential Excellent    PT Frequency 2x / week    PT Duration 8 weeks    PT Treatment/Interventions ADLs/Self Care Home Management;Cryotherapy;Gait training;Stair training;Functional mobility training;Therapeutic activities;Therapeutic exercise;Balance training;Neuromuscular re-education;Manual techniques;Passive range of motion;Scar mobilization;Patient/family education;Manual lymph drainage;Compression bandaging;Vasopneumatic Device;Taping    PT Next Visit Plan assess balance as able, progress early quadriceps strengthening, R knee AAROM, utilize [employer] Knee TKA protocol    PT Home Exercise Plan [place] and Agree with Plan of Care Patient             Patient will benefit from skilled therapeutic intervention in order to improve the following deficits and impairments:  Abnormal gait, Decreased range of motion, Difficulty walking, Decreased activity tolerance, Pain, Impaired flexibility, Hypomobility, Decreased balance, Decreased strength, Decreased mobility  Visit Diagnosis: Chronic pain of right knee  Swelling of joint of right knee  Stiffness of right knee, not elsewhere classified  Unsteadiness on feet  Muscle weakness (generalized)     Problem List Patient Active Problem List   Diagnosis Date Noted   S/P total knee arthroplasty, right 09/11/2021   Pre-op evaluation 07/17/2021   Wound of abdomen 05/04/2021   Carpal tunnel syndrome of left wrist 10/13/2020   Chronic pain of right knee 10/13/2020   Healthcare  maintenance 09/13/2019   OA (osteoarthritis) of knee 11/11/2018   Hyperlipidemia 08/26/2017   Type 2 diabetes mellitus with hyperglycemia, without long-term current use of insulin ([place]) 01/08/2016   Allergic rhinitis 11/22/2014   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[place]) 10/14/2013    Vanessa [place], PT, DPT 10/10/21 12:14 PM   [facility] [address] [place], [place], [address] Phone: [phone]   Fax:  [phone]  Name: Veronica Rogers MRN: [id number] Date of Birth: March 03, 1961

## 2021-10-12 ENCOUNTER — Other Ambulatory Visit (HOSPITAL_COMMUNITY): Payer: Self-pay

## 2021-10-17 ENCOUNTER — Ambulatory Visit: Payer: No Typology Code available for payment source

## 2021-10-17 ENCOUNTER — Other Ambulatory Visit: Payer: Self-pay

## 2021-10-17 DIAGNOSIS — G8929 Other chronic pain: Secondary | ICD-10-CM

## 2021-10-17 DIAGNOSIS — R2681 Unsteadiness on feet: Secondary | ICD-10-CM

## 2021-10-17 DIAGNOSIS — M25661 Stiffness of right knee, not elsewhere classified: Secondary | ICD-10-CM

## 2021-10-17 DIAGNOSIS — M25561 Pain in right knee: Secondary | ICD-10-CM | POA: Diagnosis not present

## 2021-10-17 DIAGNOSIS — M25461 Effusion, right knee: Secondary | ICD-10-CM

## 2021-10-17 DIAGNOSIS — M6281 Muscle weakness (generalized): Secondary | ICD-10-CM

## 2021-10-17 NOTE — Therapy (Signed)
[place], [place], [address] Phone: [phone]   Fax:  [phone]  Physical Therapy Treatment  Patient Details  Name: Veronica Rogers MRN: [id number] Date of Birth: 01/25/1961 Referring Provider (PT): Irving Copas, [place]   Encounter Date: 10/17/2021   PT End of Session - 10/17/21 1149     Visit Number 9    Number of Visits 17    Date for PT Re-Evaluation 11/08/21    Authorization Type [employer]    PT Start Time 1130    PT Stop Time 1220   10 minutes vasopneumatic   PT Time Calculation (min) 50 min    Activity Tolerance Patient tolerated treatment well    Behavior During Therapy [facility] for tasks assessed/performed             Past Medical History:  Diagnosis Date   Arthritis    Breast cancer ([place])    Contact lens/glasses fitting    wears contacts or glasses   Diabetes ([place]) 11/22/2014   GERD (gastroesophageal reflux disease)    Hyperlipidemia    Personal history of chemotherapy    Personal history of radiation therapy    Radiation 03/28/14-05/06/14   Left Breast 60 Gy    Past Surgical History:  Procedure Laterality Date   BREAST BIOPSY  1996   right breast, benign   BREAST LUMPECTOMY WITH NEEDLE LOCALIZATION AND AXILLARY SENTINEL LYMPH NODE BX Left 10/29/2013   Procedure: BREAST LUMPECTOMY WITH NEEDLE LOCALIZATION AND AXILLARY SENTINEL LYMPH NODE BX;  Surgeon: Rolm Bookbinder, MD;  Location: [facility];  Service: General;  Laterality: Left;   COLONOSCOPY  05/2011   WNL   TOTAL KNEE ARTHROPLASTY Right 09/11/2021   Procedure: TOTAL KNEE ARTHROPLASTY;  Surgeon: Paralee Cancel, MD;  [facility];  Service: Orthopedics;  Laterality: Right;   WISDOM TOOTH EXTRACTION      There were no vitals filed for this visit.   Subjective Assessment - 10/17/21 1133     Subjective Pt reports that her knee has been sore the past few days, which she attributes to "overdoing it" with  stretching or the cold weather. She otherwise has been doing well and has an upcoming orthopedic appointment on 10/26/2021. Pt asks for vasopneumatic treatment today due to acute mild swelling at her knee.    Currently in Pain? Yes    Pain Score 4     Pain Location Knee    Pain Orientation Right    Pain Descriptors / Indicators Aching;Sore    Pain Type Surgical pain    Pain Onset More than a month ago    Pain Frequency Intermittent                               [facility] Adult PT Treatment/Exercise - 10/17/21 0001       Knee/Hip Exercises: Aerobic   Stationary Bike x5 minutes, level 5 resistance, while collecting subjective hx      Knee/Hip Exercises: Machines for Strengthening   Cybex Leg Press 60# 3x10      Knee/Hip Exercises: Standing   Rebounder 3x15 SL stance on R with 2kg ball toss    Other Standing Knee Exercises Single leg RDL with 5# KB 2x10 BIL      Knee/Hip Exercises: Prone   Hamstring Curl 2 sets;10 reps    Hamstring Curl Limitations 5# ankle weights    Prone Knee Hang Weights (  lbs) 5    Prone Knee Hang Limitations 2x1 minute in between hamstring curl sets      Vasopneumatic   Number Minutes Vasopneumatic  10 minutes    Vasopnuematic Location  Knee   R   Vasopneumatic Pressure Medium    Vasopneumatic Temperature  34                       PT Short Term Goals - 09/28/21 1130       PT SHORT TERM GOAL #1   Title Pt will report understanding and adherence to her HEP in order to promote independence in the management of her primaryt sxs.    Baseline Pt is adherent to HEP    Time 4    Period Weeks    Status Achieved    Target Date 10/11/21      PT SHORT TERM GOAL #2   Title Pt will report 50% decrease in swelling in order to achieve further R knee functional ROM.    Baseline 75% improved swelling per pt (09/28/2021)    Time 4    Period Weeks    Status Achieved    Target Date 10/11/21      PT SHORT TERM GOAL #3   Title Pt will  complete a long arc quad with no quad lag in order to progress to closed-chain LE strengthening exercises.    Baseline Achieved    Time 4    Period Weeks    Status Achieved    Target Date 10/11/21               PT Long Term Goals - 10/10/21 1213       PT LONG TERM GOAL #1   Title Pt will demonstrate the ability to ambulate a 6MWT without an AD or seated rest in order to return to walking 5K's without limitation.    Baseline WBAT, utilizing FWW for gait, antalgic gait pattern    Time 8    Period Weeks    Status On-going      PT LONG TERM GOAL #2   Title Pt will achieve a FOTO score of 45% in order to demonstrate improved functional ability as it relates to her knee sxs.    Baseline 10%, 50% (09/28/2021)    Time 8    Period Weeks    Status Achieved      PT LONG TERM GOAL #3   Title Pt will achieve R knee extension AROM of 5-0 degrees in order to achieve a normal gait pattern.    Baseline 33 degrees; 10 degrees (10/10/2021)    Time 8    Period Weeks    Status On-going      PT LONG TERM GOAL #4   Title Pt will achieve R knee flexion AROM of 125 degrees or greater in order to sit upright at work without limitation.    Baseline 50 degrees; 118 degrees (10/10/2021)    Time 8    Period Weeks    Status On-going      PT LONG TERM GOAL #5   Title Pt will achieve R knee flexion and extension MMT of 5/5 in order to progress her independent LE strengthening program.    Baseline 1/5; 5/5 (10/10/2021)    Time 8    Period Weeks    Status Achieved                   Plan - 10/17/21 1150  Clinical Impression Statement Pt responded well to treatment today with good form and no increase in pain with selected exercises. She demonstrates continued functional LE strength gains as indicated in her ability to perform more advanced exercises with more resistance. Additionally, the pt states her primary focus at home has been knee extension and flexion AROM, which is in line  with her stated rehab goals. Pt leaves with 0/10 pain following vasopneumatic treatment. She will benefit from continued PT to address her remaining impairments and return to her prior level of function without limitation.    Personal Factors and Comorbidities Comorbidity 3+    Comorbidities T2DM, hyperlipidemia, hx of Cx    Examination-Activity Limitations Bend;Locomotion Level;Transfers;Sit;Bed Mobility;Squat;Stairs;Stand;Lift;Dressing    Examination-Participation Restrictions Occupation;Cleaning;Community Activity;Shop    Stability/Clinical Decision Making Stable/Uncomplicated    Clinical Decision Making Low    Rehab Potential Excellent    PT Frequency 2x / week    PT Duration 8 weeks    PT Treatment/Interventions ADLs/Self Care Home Management;Cryotherapy;Gait training;Stair training;Functional mobility training;Therapeutic activities;Therapeutic exercise;Balance training;Neuromuscular re-education;Manual techniques;Passive range of motion;Scar mobilization;Patient/family education;Manual lymph drainage;Compression bandaging;Vasopneumatic Device;Taping    PT Next Visit Plan progress early quadriceps strengthening, R knee AAROM, utilize [employer] Knee TKA protocol    PT Home Exercise Plan [place] and Agree with Plan of Care Patient             Patient will benefit from skilled therapeutic intervention in order to improve the following deficits and impairments:  Abnormal gait, Decreased range of motion, Difficulty walking, Decreased activity tolerance, Pain, Impaired flexibility, Hypomobility, Decreased balance, Decreased strength, Decreased mobility  Visit Diagnosis: Chronic pain of right knee  Swelling of joint of right knee  Stiffness of right knee, not elsewhere classified  Unsteadiness on feet  Muscle weakness (generalized)     Problem List Patient Active Problem List   Diagnosis Date Noted   S/P total knee arthroplasty, right  09/11/2021   Pre-op evaluation 07/17/2021   Wound of abdomen 05/04/2021   Carpal tunnel syndrome of left wrist 10/13/2020   Chronic pain of right knee 10/13/2020   Healthcare maintenance 09/13/2019   OA (osteoarthritis) of knee 11/11/2018   Hyperlipidemia 08/26/2017   Type 2 diabetes mellitus with hyperglycemia, without long-term current use of insulin ([place]) 01/08/2016   Allergic rhinitis 11/22/2014   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[place]) 10/14/2013    Vanessa [place], PT, DPT 10/17/21 12:20 PM   [facility] [address] [place], [place], [address] Phone: [phone]   Fax:  [phone]  Name: Veronica Rogers MRN: [id number] Date of Birth: 1961-07-23

## 2021-10-24 ENCOUNTER — Other Ambulatory Visit: Payer: Self-pay

## 2021-10-24 ENCOUNTER — Ambulatory Visit: Payer: No Typology Code available for payment source

## 2021-10-24 DIAGNOSIS — R2681 Unsteadiness on feet: Secondary | ICD-10-CM

## 2021-10-24 DIAGNOSIS — G8929 Other chronic pain: Secondary | ICD-10-CM

## 2021-10-24 DIAGNOSIS — M25461 Effusion, right knee: Secondary | ICD-10-CM

## 2021-10-24 DIAGNOSIS — M25561 Pain in right knee: Secondary | ICD-10-CM | POA: Diagnosis not present

## 2021-10-24 DIAGNOSIS — M6281 Muscle weakness (generalized): Secondary | ICD-10-CM

## 2021-10-24 DIAGNOSIS — M25661 Stiffness of right knee, not elsewhere classified: Secondary | ICD-10-CM

## 2021-10-24 NOTE — Therapy (Signed)
[place], [place], [address] Phone: [phone]   Fax:  [phone]  Physical Therapy Treatment/ Discharge Summary  Patient Details  Name: Veronica Rogers MRN: [id number] Date of Birth: [birth year] Referring Provider (PT): Irving Copas, [place]   Encounter Date: 10/24/2021   PT End of Session - 10/24/21 1158     Visit Number 10    Number of Visits 17    Date for PT Re-Evaluation 11/08/21    Authorization Type [employer]    PT Start Time 1130    PT Stop Time 6789    PT Time Calculation (min) 45 min    Activity Tolerance Patient tolerated treatment well    Behavior During Therapy [facility] for tasks assessed/performed             Past Medical History:  Diagnosis Date   Arthritis    Breast cancer ([place])    Contact lens/glasses fitting    wears contacts or glasses   Diabetes ([place]) 11/22/2014   GERD (gastroesophageal reflux disease)    Hyperlipidemia    Personal history of chemotherapy    Personal history of radiation therapy    Radiation 03/28/14-05/06/14   Left Breast 60 Gy    Past Surgical History:  Procedure Laterality Date   BREAST BIOPSY  1996   right breast, benign   BREAST LUMPECTOMY WITH NEEDLE LOCALIZATION AND AXILLARY SENTINEL LYMPH NODE BX Left 10/29/2013   Procedure: BREAST LUMPECTOMY WITH NEEDLE LOCALIZATION AND AXILLARY SENTINEL LYMPH NODE BX;  Surgeon: Rolm Bookbinder, MD;  Location: [facility];  Service: General;  Laterality: Left;   COLONOSCOPY  05/2011   WNL   TOTAL KNEE ARTHROPLASTY Right 09/11/2021   Procedure: TOTAL KNEE ARTHROPLASTY;  Surgeon: Paralee Cancel, MD;  [facility];  Service: Orthopedics;  Laterality: Right;   WISDOM TOOTH EXTRACTION      There were no vitals filed for this visit.   Subjective Assessment - 10/24/21 1129     Subjective Pt reports feeling great today with no reports of pain. She reports feeling independent in the progression  of her HEP and states she is ready to be discharged from PT at this time.    Currently in Pain? No/denies    Pain Score 0-No pain                [facility] PT Assessment - 10/24/21 0001       AROM   Right Knee Extension 12    Right Knee Flexion 114      6 minute walk test results    Aerobic Endurance Distance Walked 1300    Endurance additional comments Independently                           [facility] Adult PT Treatment/Exercise - 10/24/21 0001       Knee/Hip Exercises: Stretches   Active Hamstring Stretch Limitations 2x70mn on R      Knee/Hip Exercises: Aerobic   Stationary Bike x5 minutes, level 5 resistance, while collecting subjective hx      Knee/Hip Exercises: Standing   Other Standing Knee Exercises Traditional deadlift 3x8 with olympic barbell and 30 pounds      Knee/Hip Exercises: Prone   Hamstring Curl 2 sets;10 reps    Hamstring Curl Limitations 6# ankle weights    Prone Knee Hang Weights (lbs) 6    Prone Knee Hang Limitations 3x1 minute in between hamstring  curl sets      Manual Therapy   Manual Therapy Soft tissue mobilization    Soft tissue mobilization Deep tissue efflourage to distal hamstring musculature                     PT Education - 10/24/21 1158     Education Details Pt educated on ways to independently progress her HEP. Also educated on objective measures, discussing remaining limitations and progress made in PT.    Person(s) Educated Patient    Methods Explanation    Comprehension Verbalized understanding              PT Short Term Goals - 09/28/21 1130       PT SHORT TERM GOAL #1   Title Pt will report understanding and adherence to her HEP in order to promote independence in the management of her primaryt sxs.    Baseline Pt is adherent to HEP    Time 4    Period Weeks    Status Achieved    Target Date 10/11/21      PT SHORT TERM GOAL #2   Title Pt will report 50% decrease in swelling in order to  achieve further R knee functional ROM.    Baseline 75% improved swelling per pt (09/28/2021)    Time 4    Period Weeks    Status Achieved    Target Date 10/11/21      PT SHORT TERM GOAL #3   Title Pt will complete a long arc quad with no quad lag in order to progress to closed-chain LE strengthening exercises.    Baseline Achieved    Time 4    Period Weeks    Status Achieved    Target Date 10/11/21               PT Long Term Goals - 10/24/21 1217       PT LONG TERM GOAL #1   Title Pt will demonstrate the ability to ambulate a 6MWT without an AD or seated rest in order to return to walking 5K's without limitation.    Baseline Independent with no limitation, [id number] ambulated (10/24/2021); WBAT, utilizing FWW for gait, antalgic gait pattern    Time 8    Period Weeks    Status Achieved      PT LONG TERM GOAL #2   Title Pt will achieve a FOTO score of 45% in order to demonstrate improved functional ability as it relates to her knee sxs.    Baseline 10%, 50% (09/28/2021)    Time 8    Period Weeks    Status Achieved      PT LONG TERM GOAL #3   Title Pt will achieve R knee extension AROM of 5-0 degrees in order to achieve a normal gait pattern.    Baseline 33 degrees; 10 degrees (10/10/2021); 12 degrees (10/24/2021)    Time 8    Period Weeks    Status Partially Met      PT LONG TERM GOAL #4   Title Pt will achieve R knee flexion AROM of 125 degrees or greater in order to sit upright at work without limitation.    Baseline 50 degrees; 118 degrees (10/10/2021), 114 degrees (10/24/2021)    Time 8    Period Weeks    Status Partially Met      PT LONG TERM GOAL #5   Title Pt will achieve R knee flexion and extension MMT of 5/5 in  order to progress her independent LE strengthening program.    Baseline 1/5; 5/5 (10/10/2021)    Time 8    Period Weeks    Status Achieved                   Plan - 10/24/21 1215     Clinical Impression Statement Upon reassessment of  objective measures, the pt remains limited in R knee extension and flexion AROM, although she has made excellent progress in these measures throughout her POC. She was also able to accomplish a 6-minute walk test with no limitation and good functional gait speed. Due to the pt reporting she is ready to be discharged due to feeling functionally independent and understanding how to independently progress her HEP, along with her meeting or mostly meeting her rehab goals, she is discharged from PT at this time. She tolerated the treatment session today well, demonstrating good form and no pain with selected exercises.    PT Next Visit Plan Pt is discharged from PT at this time    PT [place] and Agree with Plan of Care Patient             Patient will benefit from skilled therapeutic intervention in order to improve the following deficits and impairments:     Visit Diagnosis: Chronic pain of right knee  Swelling of joint of right knee  Stiffness of right knee, not elsewhere classified  Unsteadiness on feet  Muscle weakness (generalized)     Problem List Patient Active Problem List   Diagnosis Date Noted   S/P total knee arthroplasty, right 09/11/2021   Pre-op evaluation 07/17/2021   Wound of abdomen 05/04/2021   Carpal tunnel syndrome of left wrist 10/13/2020   Chronic pain of right knee 10/13/2020   Healthcare maintenance 09/13/2019   OA (osteoarthritis) of knee 11/11/2018   Hyperlipidemia 08/26/2017   Type 2 diabetes mellitus with hyperglycemia, without long-term current use of insulin ([place]) 01/08/2016   Allergic rhinitis 11/22/2014   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[place]) 10/14/2013    Vanessa [place], PT, DPT 10/24/21 12:20 PM   [facility] [address] [place], [place], [address] Phone: [phone]   Fax:  [phone]  Name: WAI MINOTTI MRN:  [id number] Date of Birth: [birth year]

## 2021-10-25 ENCOUNTER — Other Ambulatory Visit (HOSPITAL_COMMUNITY): Payer: Self-pay

## 2021-10-25 ENCOUNTER — Other Ambulatory Visit: Payer: Self-pay | Admitting: Internal Medicine

## 2021-10-25 DIAGNOSIS — E1165 Type 2 diabetes mellitus with hyperglycemia: Secondary | ICD-10-CM

## 2021-10-25 MED ORDER — INFLUENZA VAC SPLIT QUAD 0.5 ML IM SUSY
PREFILLED_SYRINGE | INTRAMUSCULAR | 0 refills | Status: DC
  Filled 2021-10-25: qty 0.5, 1d supply, fill #0

## 2021-10-26 ENCOUNTER — Other Ambulatory Visit (HOSPITAL_COMMUNITY): Payer: Self-pay

## 2021-10-26 ENCOUNTER — Ambulatory Visit (INDEPENDENT_AMBULATORY_CARE_PROVIDER_SITE_OTHER): Payer: No Typology Code available for payment source | Admitting: Internal Medicine

## 2021-10-26 ENCOUNTER — Other Ambulatory Visit: Payer: Self-pay

## 2021-10-26 ENCOUNTER — Encounter: Payer: Self-pay | Admitting: Internal Medicine

## 2021-10-26 DIAGNOSIS — E1165 Type 2 diabetes mellitus with hyperglycemia: Secondary | ICD-10-CM

## 2021-10-26 LAB — POCT GLYCOSYLATED HEMOGLOBIN (HGB A1C): Hemoglobin A1C: 6.3 % — AB (ref 4.0–5.6)

## 2021-10-26 MED ORDER — GLIPIZIDE 5 MG PO TABS
5.0000 mg | ORAL_TABLET | Freq: Every day | ORAL | 3 refills | Status: DC
  Filled 2021-10-26: qty 180, 90d supply, fill #0
  Filled 2022-07-22: qty 180, 90d supply, fill #1

## 2021-10-26 MED ORDER — METFORMIN HCL ER 750 MG PO TB24
1500.0000 mg | ORAL_TABLET | Freq: Every day | ORAL | 3 refills | Status: DC
  Filled 2021-10-26: qty 180, 90d supply, fill #0
  Filled 2022-03-07: qty 180, 90d supply, fill #1
  Filled 2022-06-11: qty 180, 90d supply, fill #2
  Filled 2022-09-30: qty 180, 90d supply, fill #3

## 2021-10-26 NOTE — Patient Instructions (Addendum)
Please continue:  - Metformin ER 1500 mg with dinner - Glipizide 5 mg - before a larger dinner - Rybelsus 7 mg before b'fast  Please return in 4-6 months with your sugar log.

## 2021-10-26 NOTE — Progress Notes (Signed)
Patient ID: Veronica Rogers, female   DOB: February 09, 1961, 60 y.o.   MRN: [id number]   This visit occurred during the SARS-CoV-2 public health emergency.  Safety protocols were in place, including screening questions prior to the visit, additional usage of staff PPE, and extensive cleaning of exam room while observing appropriate contact time as indicated for disinfecting solutions.   HPI: Veronica Rogers is a 60 y.o.-year-old female, presenting presenting for f/u for DM2, dx in 2015 (after steroids), non-insulin-dependent, uncontrolled, without long term complications. Last visit 4.5 months ago.  Interim history: No increased urination, blurry vision, nausea, chest pain. She had R TKR 6 weeks ago - has been out of work >> will start in few days.  She feels that this is healing well, only has minimal swelling around the knee. She stopped snacks since last visit.  Reviewed HbA1c levels: Lab Results  Component Value Date   HGBA1C 6.7 (H) 08/02/2021   HGBA1C 6.9 (A) 05/08/2021   HGBA1C 7.2 (H) 10/13/2020   Pt is on a regimen of:  - Metformin ER 1500 mg with dinner - Januvia 100 mg before breakfast >> Rybelsus 7  mg before breakfast - Glipizide 5 mg before dinner She was on insulin during ChTx. She was on regular metformin >> GI upset (diarrhea). She has frequent urination.  Pt checks her sugars 1-3 times a day: - am:  119-140, 161 >> 128-166 (forgot metformin) >> 118-125 - 2h after b'fast:95-101 >> 120 >> n/c - before lunch: 98-169 >> n/c >> 89 >> 109-114 >> n/c >> 125-130 - 2h after lunch:n/c >> 140-152 >> n/c >> 140-149 >> n/c - before dinner: 134, 135 >> n/c >> 122, 140, 186 >> 150  >> n/c - 2h after dinner: 152 >> n/c >> 160s >> n/c >> 189 >> n/c - bedtime: n/c  >> 125-135 - nighttime: 85, 89, 98, 120 >> 114 >> n/c >> 119 Lowest sugar was 85 >> 109 >> 128 >> 118;  she has hypoglycemia awareness in the 70s. Highest sugar was  189 >> 166 >> 200 x1.  Glucometer: True test >> AccuChek  guide >> Freestyle Lite  Pt's meals are: - Breakfast: cereals or 2 eggs or skips >> cereals -- Lunch: leftovers from home: sandwich; soup; meat + veggies; chinese; salad - Dinner: eats out most dinners!  -No HL, last BUN/creatinine:  Lab Results  Component Value Date   BUN 17 09/12/2021   CREATININE 0.74 09/12/2021   -+HL; last set of lipids: Lab Results  Component Value Date   CHOL 181 05/08/2021   HDL 40.00 05/08/2021   LDLCALC 103 (H) 05/08/2021   LDLDIRECT 116.0 09/13/2019   TRIG 189.0 (H) 05/08/2021   CHOLHDL 5 05/08/2021  Not on a statin.  Continues on fenofibrate 48.  - last eye exam was on 10/03/2020: No DR. Dr. Katy Fitch.  Has an appointment coming up in 12/2021.  -She denies numbness and tingling in her feet.  She has a history of plantar fasciitis.  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 On Tamoxifen.  ROS: + see HPI + Acid reflux  I reviewed pt's medications, allergies, PMH, social hx, family hx, and changes were documented in the history of present illness. Otherwise, unchanged from my initial visit note.  Past Medical History:  Diagnosis Date   Arthritis    Breast cancer ([place])    Contact lens/glasses fitting    wears contacts or glasses   Diabetes ([place]) 11/22/2014   GERD (gastroesophageal reflux disease)  Hyperlipidemia    Personal history of chemotherapy    Personal history of radiation therapy    Radiation 03/28/14-05/06/14   Left Breast 60 Gy   Past Surgical History:  Procedure Laterality Date   BREAST BIOPSY  1996   right breast, benign   BREAST LUMPECTOMY WITH NEEDLE LOCALIZATION AND AXILLARY SENTINEL LYMPH NODE BX Left 10/29/2013   Procedure: BREAST LUMPECTOMY WITH NEEDLE LOCALIZATION AND AXILLARY SENTINEL LYMPH NODE BX;  Surgeon: Rolm Bookbinder, MD;  Location: [facility];  Service: General;  Laterality: Left;   COLONOSCOPY  05/2011   WNL   TOTAL KNEE ARTHROPLASTY Right 09/11/2021   Procedure: TOTAL KNEE ARTHROPLASTY;  Surgeon: Paralee Cancel, MD;   Location: [facility];  Service: Orthopedics;  Laterality: Right;   WISDOM TOOTH EXTRACTION     Social History   Social History   Marital Status: Married    Spouse Name: N/A   Number of Children: 2   Occupational History   [place]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 18 g 1   celecoxib (CELEBREX) 200 MG capsule Take 1 capsule (200 mg total) by mouth 2 (two) times daily. 60 capsule 0   COVID-19 At Home Antigen Test (CARESTART COVID-19 HOME TEST) KIT use as directed 4 each 0   cyclobenzaprine (FLEXERIL) 5 MG tablet Take 1 tablet (5 mg total) by mouth 2 (two) times daily as needed for muscle spasms. 30 tablet 0   fenofibrate (TRICOR) 48 MG tablet TAKE 1 TABLET (48 MG TOTAL) BY MOUTH DAILY. 90 tablet 3   glipiZIDE (GLUCOTROL) 5 MG tablet TAKE 1 TO 2 TABLETS BY MOUTH DAILY WITH EVENING MEAL 180 tablet 0   glucose blood (FREESTYLE LITE) test strip Use as directed twice daily for glucose testing 100 each 12   glucose monitoring kit (FREESTYLE) monitoring kit 1 each by Does not apply route as needed for other. UAD for bid blood glucose testing Dx: E11.65 1 each 1   hydrocortisone-pramoxine (ANALPRAM-HC) 2.5-1 % rectal cream Apply up to twice daily 30 g 3   influenza vac split quadrivalent PF (FLUARIX) 0.5 ML injection Inject into the muscle. 0.5 mL 0   Lancets (FREESTYLE) lancets UAD for bid blood glucose testing Dx: E11.65 100 each 12   Lidocaine-Hydrocort, Perianal, 3-0.5 % CREA Apply as needed up to twice daily. 98 g 3   metFORMIN (GLUCOPHAGE-XR) 750 MG 24 hr tablet TAKE 2 TABLETS BY MOUTH DAILY AT  BEDTIME 180 tablet 3   oxybutynin (DITROPAN-XL) 10 MG 24 hr tablet TAKE 1 TABLET EVERY DAY BY MOUTH IN THE MORNING 90 tablet 3   pregabalin (LYRICA) 75 MG capsule Take 1 capsule (75 mg total) by mouth 2 (two) times daily. 60 capsule 1   Semaglutide (RYBELSUS) 7 MG TABS TAKE 1 TABLET BY MOUTH ONCE DAILY BEFORE BREAKFAST. 90 tablet 3   sertraline (ZOLOFT) 50 MG tablet Take 1 tablet (50 mg total) by mouth daily. 90 tablet 3   tamoxifen (NOLVADEX) 20 MG tablet TAKE 1 TABLET BY MOUTH DAILY. [place]  tablet 3   No current facility-administered medications on file prior to visit.   Allergies  Allergen Reactions   Erythromycin Other (See Comments)    Stomach pain   B-Complex-B-12 [B Complex Vitamins] Itching and Rash    Itchy skin   Tape Rash   Family History  Problem Relation Age of Onset   Squamous cell carcinoma Father    Cancer Father    Diabetes Father    Hypertension Mother    Diabetes Sister    PE: BP 128/78 (BP Location: Right Arm, Patient Position: Sitting, Cuff Size: Normal)   Pulse 78   Ht $R'5\' 7"'Vk$  (1.702 m)   Wt 191 lb 6.4 oz (86.8 kg)   LMP 11/08/2013   SpO2 97%   BMI 29.98 kg/m  Body mass index is 29.98 kg/m. Wt Readings from Last 3 Encounters:  10/26/21 191 lb 6.4 oz (86.8 kg)  10/02/21 191 lb (86.6 kg)  09/11/21 186 lb 3.2 oz (84.5 kg)   Constitutional: overweight, in NAD Eyes: PERRLA, EOMI, no exophthalmos ENT: moist mucous membranes, no thyromegaly, no cervical lymphadenopathy Cardiovascular: RRR, No MRG Respiratory: CTA B Gastrointestinal: abdomen soft, NT, ND, BS+ Musculoskeletal: no deformities, strength intact in all 4 Skin: moist, warm, no rashes, R knee surgical scar healed Neurological: no tremor with outstretched hands, DTR normal in all 4  ASSESSMENT: 1. DM2, non-insulin-dependent, uncontrolled, without long term complications, but with hyperglycemia  No FH of MTC or personal hx of pancreatitis.  2. HL  3.  Overweight  PLAN:  1. Patient with  longstanding well-controlled, type 2 diabetes, on oral medication with metformin, sulfonylurea, and p.o. GLP-1 receptor agonist, increased at last visit.  She tolerates Rybelsus well.  HbA1c at last visit was better, at 6.9%, but this was higher than expected from her log.  At that time, however, she was not checking sugars consistently, and the ones that she was checking were slightly above target.  I advised her to increase Rybelsus from 7 to 14 mg daily.  We also discussed about stopping dried fruit for snack and in fact, to try not to snack between meals.  We also discussed about healthier breakfast foods to replace cereals. -After last visit, she had another HbA1c which was better than before, at 6.7% almost 3 months ago. -At today's visit, she mentions that she did not increase the Rybelsus dose from 7 mg daily as this was working well for her.  Reviewing her blood sugars at home, they are at goal, so I agree with her that we do not absolutely need to increase the dose.  She tells me that she occasionally misses glipizide and occasionally misses metformin.  I advised her to try to be consistent with metformin, but for glipizide, she may only take it before a larger meal.  No low blood sugar since last visit and only had 1 hyperglycemic blood sugar at 200, but most recently, sugars have not increased to more than 140s or 150s.  No need to change the regimen for now. - I suggested to:  Patient Instructions  Please continue:  - Metformin ER 1500 mg with dinner - Glipizide 5 mg - before a larger dinner - Rybelsus 7 mg before b'fast  Please return in 4-6 months with your sugar log.  - we checked her HbA1c: 6.3% (improved) - advised to check sugars at different times of the day - 1x a day, rotating check times - advised for yearly eye exams >> she is not UTD but  has an appointment coming up  - return to clinic in 4 months   2. HL -Reviewed latest lipid panel from 04/2021: LDL slightly higher than  goal: Lab Results  Component Value Date   CHOL 181 05/08/2021   HDL 40.00 05/08/2021   LDLCALC 103 (H) 05/08/2021   LDLDIRECT 116.0 09/13/2019   TRIG 189.0 (H) 05/08/2021   CHOLHDL 5 05/08/2021  -Her triglycerides are much better, previously in the 700s, now only slightly above target -She was inconsistently taking fenofibrate 48 mg daily in the past and we discussed about the importance of taking it every day  3.  Overweight -We will continue Rybelsus, which should also help with weight loss -At last visit, her weight was stable compared to the previous visit -wt at last OV: 194 lbs >> 191 lbs today (lost a net 3 pounds)  Philemon Kingdom, MD PhD [facility] Endocrinology

## 2021-10-30 ENCOUNTER — Ambulatory Visit: Payer: No Typology Code available for payment source | Admitting: Internal Medicine

## 2021-10-30 ENCOUNTER — Other Ambulatory Visit (HOSPITAL_COMMUNITY): Payer: Self-pay

## 2021-11-16 ENCOUNTER — Ambulatory Visit: Payer: No Typology Code available for payment source | Admitting: Family Medicine

## 2021-11-21 ENCOUNTER — Other Ambulatory Visit (HOSPITAL_COMMUNITY): Payer: Self-pay

## 2021-11-21 ENCOUNTER — Encounter: Payer: Self-pay | Admitting: Family Medicine

## 2021-11-21 DIAGNOSIS — E782 Mixed hyperlipidemia: Secondary | ICD-10-CM

## 2021-11-21 MED ORDER — AMOXICILLIN 500 MG PO CAPS
2000.0000 mg | ORAL_CAPSULE | ORAL | 0 refills | Status: DC
  Filled 2021-11-21: qty 12, 3d supply, fill #0

## 2021-11-21 MED ORDER — ATORVASTATIN CALCIUM 10 MG PO TABS
10.0000 mg | ORAL_TABLET | Freq: Every day | ORAL | 0 refills | Status: DC
Start: 2021-11-21 — End: 2022-04-30
  Filled 2021-11-21: qty 90, 90d supply, fill #0

## 2021-12-03 ENCOUNTER — Other Ambulatory Visit (HOSPITAL_COMMUNITY): Payer: Self-pay

## 2021-12-03 MED FILL — Oxybutynin Chloride Tab ER 24HR 10 MG: ORAL | 90 days supply | Qty: 90 | Fill #2 | Status: AC

## 2021-12-12 ENCOUNTER — Other Ambulatory Visit (HOSPITAL_COMMUNITY): Payer: Self-pay

## 2021-12-14 ENCOUNTER — Other Ambulatory Visit (HOSPITAL_COMMUNITY): Payer: Self-pay

## 2021-12-28 ENCOUNTER — Ambulatory Visit (INDEPENDENT_AMBULATORY_CARE_PROVIDER_SITE_OTHER): Payer: No Typology Code available for payment source | Admitting: Family Medicine

## 2021-12-28 ENCOUNTER — Other Ambulatory Visit: Payer: Self-pay

## 2021-12-28 ENCOUNTER — Encounter: Payer: Self-pay | Admitting: Family Medicine

## 2021-12-28 VITALS — BP 120/74 | HR 85 | Temp 97.4°F | Ht 67.0 in | Wt 191.4 lb

## 2021-12-28 DIAGNOSIS — D509 Iron deficiency anemia, unspecified: Secondary | ICD-10-CM

## 2021-12-28 DIAGNOSIS — E1165 Type 2 diabetes mellitus with hyperglycemia: Secondary | ICD-10-CM | POA: Diagnosis not present

## 2021-12-28 DIAGNOSIS — Z170 Estrogen receptor positive status [ER+]: Secondary | ICD-10-CM

## 2021-12-28 DIAGNOSIS — Z0000 Encounter for general adult medical examination without abnormal findings: Secondary | ICD-10-CM

## 2021-12-28 DIAGNOSIS — C50212 Malignant neoplasm of upper-inner quadrant of left female breast: Secondary | ICD-10-CM | POA: Diagnosis not present

## 2021-12-28 DIAGNOSIS — E782 Mixed hyperlipidemia: Secondary | ICD-10-CM | POA: Diagnosis not present

## 2021-12-28 NOTE — Progress Notes (Addendum)
Established Patient Office Visit  Subjective:  Patient ID: Veronica Rogers, female    DOB: January 28, 1961  Age: 60 y.o. MRN: [id number]  CC:  Chief Complaint  Patient presents with   Follow-up    F/u to discuss starting cholesterol meds.      HPI Veronica Rogers presents for her yearly physical and health check.  She has done well status post right total knee replacement back in September.  Physical therapy was prolonged secondary Veronica Rogers to chronic pain.  He is doing better now.  Continues with endocrinology for follow-up of diabetes that is now well controlled.  Last hemoglobin A1c was 6.7.  She is taking atorvastatin 10 mg daily without issue.  She is not taking fenofibrate.  Has follow-up planned with her oncologist Dr. Lindi Rogers as well as her ophthalmologist Dr. Katy Rogers.   Past Medical History:  Diagnosis Date   Arthritis    Breast cancer ([place])    Contact lens/glasses fitting    wears contacts or glasses   Diabetes ([place]) 11/22/2014   GERD (gastroesophageal reflux disease)    Hyperlipidemia    Personal history of chemotherapy    Personal history of radiation therapy    Radiation 03/28/14-05/06/14   Left Breast 60 Gy    Past Surgical History:  Procedure Laterality Date   BREAST BIOPSY  1996   right breast, benign   BREAST LUMPECTOMY WITH NEEDLE LOCALIZATION AND AXILLARY SENTINEL LYMPH NODE BX Left 10/29/2013   Procedure: BREAST LUMPECTOMY WITH NEEDLE LOCALIZATION AND AXILLARY SENTINEL LYMPH NODE BX;  Surgeon: Veronica Bookbinder, MD;  Location: [facility];  Service: General;  Laterality: Left;   COLONOSCOPY  05/2011   WNL   TOTAL KNEE ARTHROPLASTY Right 09/11/2021   Procedure: TOTAL KNEE ARTHROPLASTY;  Surgeon: Veronica Cancel, MD;  Location: [facility];  Service: Orthopedics;  Laterality: Right;   WISDOM TOOTH EXTRACTION      Family History  Problem Relation Age of Onset   Squamous cell carcinoma Father    Cancer Father    Diabetes Father    Hypertension Mother     Diabetes Sister     Social History   Socioeconomic History   Marital status: Married    Spouse name: Not on file   Number of children: Not on file   Years of education: Not on file   Highest education level: Not on file  Occupational History   Not on file  Tobacco Use   Smoking status: Never   Smokeless tobacco: Never  Vaping Use   Vaping Use: Never used  Substance and Sexual Activity   Alcohol use: Yes    Comment: 1-2 drinks a month   Drug use: No   Sexual activity: Yes  Other Topics Concern   Not on file  Social History Narrative   Not on file   Social Determinants of Health   Financial Resource Strain: Not on file  Food Insecurity: Not on file  Transportation Needs: Not on file  Physical Activity: Not on file  Stress: Not on file  Social Connections: Not on file  Intimate Partner Violence: Not on file    Outpatient Medications Prior to Visit  Medication Sig Dispense Refill   albuterol (VENTOLIN HFA) 108 (90 Base) MCG/ACT inhaler INHALE 1-2 PUFFS INTO THE LUNGS EVERY 6 (SIX) HOURS AS NEEDED FOR WHEEZING OR SHORTNESS OF BREATH. 18 g 1   amoxicillin (AMOXIL) 500 MG capsule Take 4 capsules (2,000 mg total) by mouth as directed 1 hour prior  to dental appointment 12 capsule 0   atorvastatin (LIPITOR) 10 MG tablet Take 1 tablet (10 mg total) by mouth daily. 90 tablet 0   celecoxib (CELEBREX) 200 MG capsule Take 1 capsule (200 mg total) by mouth 2 (two) times daily. 60 capsule 0   glipiZIDE (GLUCOTROL) 5 MG tablet Take 1-2 tablets (5-10 mg total) by mouth daily with an evening meal 180 tablet 3   glucose blood (FREESTYLE LITE) test strip Use as directed twice daily for glucose testing 100 each 12   glucose monitoring kit (FREESTYLE) monitoring kit 1 each by Does not apply route as needed for other. UAD for bid blood glucose testing Dx: E11.65 1 each 1   hydrocortisone-pramoxine (ANALPRAM-HC) 2.5-1 % rectal cream Apply up to twice daily 30 g 3   Lancets (FREESTYLE) lancets  UAD for bid blood glucose testing Dx: E11.65 100 each 12   Lidocaine-Hydrocort, Perianal, 3-0.5 % CREA Apply as needed up to twice daily. 98 g 3   metFORMIN (GLUCOPHAGE-XR) 750 MG 24 hr tablet Take 2 tablets (1,500 mg total) by mouth at bedtime. 180 tablet 3   oxybutynin (DITROPAN-XL) 10 MG 24 hr tablet TAKE 1 TABLET EVERY DAY BY MOUTH IN THE MORNING 90 tablet 3   Semaglutide (RYBELSUS) 7 MG TABS TAKE 1 TABLET BY MOUTH ONCE DAILY BEFORE BREAKFAST. 90 tablet 3   sertraline (ZOLOFT) 50 MG tablet Take 1 tablet (50 mg total) by mouth daily. 90 tablet 3   tamoxifen (NOLVADEX) 20 MG tablet Take 1 tablet by mouth daily. 90 tablet 3   COVID-19 At Home Antigen Test (CARESTART COVID-19 HOME TEST) KIT use as directed 4 each 0   cyclobenzaprine (FLEXERIL) 5 MG tablet Take 1 tablet (5 mg total) by mouth 2 (two) times daily as needed for muscle spasms. 30 tablet 0   fenofibrate (TRICOR) 48 MG tablet TAKE 1 TABLET (48 MG TOTAL) BY MOUTH DAILY. 90 tablet 3   influenza vac split quadrivalent PF (FLUARIX) 0.5 ML injection Inject into the muscle. 0.5 mL 0   pregabalin (LYRICA) 75 MG capsule Take 1 capsule (75 mg total) by mouth 2 (two) times daily. 60 capsule 1   No facility-administered medications prior to visit.    Allergies  Allergen Reactions   Erythromycin Other (See Comments)    Stomach pain   B-Complex-B-12 [B Complex Vitamins] Itching and Rash    Itchy skin   Tape Rash    ROS Review of Systems  Constitutional:  Negative for diaphoresis, fatigue, fever and unexpected weight change.  HENT: Negative.    Eyes:  Negative for photophobia and visual disturbance.  Respiratory: Negative.    Cardiovascular: Negative.   Gastrointestinal: Negative.   Genitourinary: Negative.   Musculoskeletal: Negative.   Neurological:  Negative for speech difficulty and weakness.     Objective:    Physical Exam Vitals and nursing note reviewed.  Constitutional:      General: She is not in acute distress.     Appearance: Normal appearance. She is not ill-appearing, toxic-appearing or diaphoretic.  HENT:     Head: Normocephalic and atraumatic.     Right Ear: Tympanic membrane, ear canal and external ear normal.     Left Ear: Tympanic membrane, ear canal and external ear normal.     Mouth/Throat:     Mouth: Mucous membranes are moist.     Pharynx: Oropharynx is clear. No oropharyngeal exudate or posterior oropharyngeal erythema.  Eyes:     General: No scleral icterus.  Right eye: No discharge.        Left eye: No discharge.     Extraocular Movements: Extraocular movements intact.     Conjunctiva/sclera: Conjunctivae normal.     Pupils: Pupils are equal, round, and reactive to light.  Neck:     Vascular: No carotid bruit.  Cardiovascular:     Rate and Rhythm: Normal rate and regular rhythm.  Pulmonary:     Effort: Pulmonary effort is normal.     Breath sounds: Normal breath sounds.  Abdominal:     General: Bowel sounds are normal.  Musculoskeletal:     Cervical back: No rigidity or tenderness.     Right lower leg: No edema.     Left lower leg: No edema.  Lymphadenopathy:     Cervical: No cervical adenopathy.  Skin:    General: Skin is warm and dry.  Neurological:     Mental Status: She is alert and oriented to person, place, and time.  Psychiatric:        Mood and Affect: Mood normal.        Behavior: Behavior normal.    BP 120/74    Pulse 85    Temp (!) 97.4 F (36.3 C) (Temporal)    Ht $R'5\' 7"'kJ$  (1.702 m)    Wt 191 lb 6.4 oz (86.8 kg)    LMP 11/08/2013    SpO2 96%    BMI 29.98 kg/m  Wt Readings from Last 3 Encounters:  12/28/21 191 lb 6.4 oz (86.8 kg)  10/26/21 191 lb 6.4 oz (86.8 kg)  10/02/21 191 lb (86.6 kg)     Health Maintenance Due  Topic Date Due   Zoster Vaccines- Shingrix (1 of 2) Never done   FOOT EXAM  07/25/2017   Pneumococcal Vaccine 15-59 Years old (2 - PCV) 09/12/2020   COLONOSCOPY (Pts 45-38yrs Insurance coverage will need to be confirmed)  05/30/2021    OPHTHALMOLOGY EXAM  10/03/2021    There are no preventive care reminders to display for this patient.  Lab Results  Component Value Date   TSH 1.86 09/13/2019   Lab Results  Component Value Date   WBC 7.9 12/28/2021   HGB 11.6 (L) 12/28/2021   HCT 36.0 12/28/2021   MCV 79.8 (L) 12/28/2021   PLT 234 12/28/2021   Lab Results  Component Value Date   NA 141 12/28/2021   K 4.0 12/28/2021   CHLORIDE 103 01/17/2015   CO2 22 12/28/2021   GLUCOSE 89 12/28/2021   BUN 16 12/28/2021   CREATININE 0.67 12/28/2021   BILITOT 0.4 12/28/2021   ALKPHOS 30 (L) 08/02/2021   AST 14 12/28/2021   ALT 10 12/28/2021   PROT 6.2 12/28/2021   ALBUMIN 4.1 08/02/2021   CALCIUM 9.6 12/28/2021   ANIONGAP 7 09/12/2021   EGFR >90 01/17/2015   GFR 83.72 05/08/2021   Lab Results  Component Value Date   CHOL 149 12/28/2021   Lab Results  Component Value Date   HDL 52 12/28/2021   Lab Results  Component Value Date   LDLCALC 77 12/28/2021   Lab Results  Component Value Date   TRIG 122 12/28/2021   Lab Results  Component Value Date   CHOLHDL 2.9 12/28/2021   Lab Results  Component Value Date   HGBA1C 6.3 (A) 10/26/2021      Assessment & Plan:   Problem List Items Addressed This Visit       Endocrine   Type 2 diabetes mellitus with hyperglycemia, without  long-term current use of insulin ([place])   Relevant Orders   Ambulatory referral to Endocrinology   CBC (Completed)   Comprehensive metabolic panel (Completed)   Urinalysis, Routine w reflex microscopic (Completed)     Other   Breast cancer of upper-inner quadrant of left female breast [facility]) - Primary   Relevant Orders   Ambulatory referral to Hematology / Oncology   Hyperlipidemia   Relevant Orders   Ambulatory referral to Ophthalmology   Comprehensive metabolic panel (Completed)   LDL cholesterol, direct (Completed)   Lipid panel (Completed)   Healthcare maintenance   Other Visit Diagnoses     Microcytic anemia        Relevant Orders   Vitamin B12   Iron, TIBC and Ferritin Panel       No orders of the defined types were placed in this encounter.  The 10-year ASCVD risk score (Arnett DK, et al., 2019) is: 5.2%   Values used to calculate the score:     Age: 60 years     Sex: Female     Is Non-Hispanic African American: No     Diabetic: Yes     Tobacco smoker: No     Systolic Blood Pressure: 972 mmHg     Is BP treated: No     HDL Cholesterol: 52 mg/dL     Total Cholesterol: 149 mg/dL  Follow-up: Return in about 6 months (around 06/28/2022), or if symptoms worsen or fail to improve.  Given information on health maintenance and disease prevention as well as preventing high cholesterol.  Extensive discussion about her lipid profile and whether or not a statin is needed.  Her 10-year risk score is in the low intermediate range at 7%.  Strong family history of heart disease on her father side.  Shared decision continue with low-dose atorvastatin.  Libby Maw, MD

## 2021-12-29 LAB — URINALYSIS, ROUTINE W REFLEX MICROSCOPIC
Bilirubin Urine: NEGATIVE
Glucose, UA: NEGATIVE
Hgb urine dipstick: NEGATIVE
Hyaline Cast: NONE SEEN /LPF
Ketones, ur: NEGATIVE
Nitrite: NEGATIVE
Protein, ur: NEGATIVE
RBC / HPF: NONE SEEN /HPF (ref 0–2)
Specific Gravity, Urine: 1.025 (ref 1.001–1.035)
pH: <=5 (ref 5.0–8.0)

## 2021-12-29 LAB — COMPREHENSIVE METABOLIC PANEL
AG Ratio: 1.8 (calc) (ref 1.0–2.5)
ALT: 10 U/L (ref 6–29)
AST: 14 U/L (ref 10–35)
Albumin: 4 g/dL (ref 3.6–5.1)
Alkaline phosphatase (APISO): 41 U/L (ref 37–153)
BUN: 16 mg/dL (ref 7–25)
CO2: 22 mmol/L (ref 20–32)
Calcium: 9.6 mg/dL (ref 8.6–10.4)
Chloride: 104 mmol/L (ref 98–110)
Creat: 0.67 mg/dL (ref 0.50–1.05)
Globulin: 2.2 g/dL (calc) (ref 1.9–3.7)
Glucose, Bld: 89 mg/dL (ref 65–99)
Potassium: 4 mmol/L (ref 3.5–5.3)
Sodium: 141 mmol/L (ref 135–146)
Total Bilirubin: 0.4 mg/dL (ref 0.2–1.2)
Total Protein: 6.2 g/dL (ref 6.1–8.1)

## 2021-12-29 LAB — LIPID PANEL
Cholesterol: 149 mg/dL (ref <200)
HDL: 52 mg/dL (ref >=50)
LDL Cholesterol (Calc): 77 mg/dL (calc)
Non-HDL Cholesterol (Calc): 97 mg/dL (calc) (ref <130)
Total CHOL/HDL Ratio: 2.9 (calc) (ref <5.0)
Triglycerides: 122 mg/dL (ref <150)

## 2021-12-29 LAB — CBC
HCT: 36 % (ref 35.0–45.0)
Hemoglobin: 11.6 g/dL — ABNORMAL LOW (ref 11.7–15.5)
MCH: 25.7 pg — ABNORMAL LOW (ref 27.0–33.0)
MCHC: 32.2 g/dL (ref 32.0–36.0)
MCV: 79.8 fL — ABNORMAL LOW (ref 80.0–100.0)
MPV: 12.2 fL (ref 7.5–12.5)
Platelets: 234 10*3/uL (ref 140–400)
RBC: 4.51 10*6/uL (ref 3.80–5.10)
RDW: 13.7 % (ref 11.0–15.0)
WBC: 7.9 10*3/uL (ref 3.8–10.8)

## 2021-12-29 LAB — LDL CHOLESTEROL, DIRECT: Direct LDL: 73 mg/dL (ref <100)

## 2022-01-02 ENCOUNTER — Other Ambulatory Visit (HOSPITAL_COMMUNITY): Payer: Self-pay

## 2022-01-02 NOTE — Addendum Note (Signed)
Addended by: Jon Billings on: 01/02/2022 08:40 AM   Modules accepted: Orders

## 2022-01-03 ENCOUNTER — Other Ambulatory Visit (HOSPITAL_COMMUNITY): Payer: Self-pay

## 2022-01-21 ENCOUNTER — Other Ambulatory Visit (HOSPITAL_COMMUNITY): Payer: Self-pay

## 2022-01-21 ENCOUNTER — Ambulatory Visit: Payer: No Typology Code available for payment source | Attending: Internal Medicine

## 2022-01-21 ENCOUNTER — Other Ambulatory Visit: Payer: Self-pay | Admitting: Family Medicine

## 2022-01-21 DIAGNOSIS — J209 Acute bronchitis, unspecified: Secondary | ICD-10-CM

## 2022-01-21 DIAGNOSIS — Z23 Encounter for immunization: Secondary | ICD-10-CM

## 2022-01-21 MED ORDER — ALBUTEROL SULFATE HFA 108 (90 BASE) MCG/ACT IN AERS
1.0000 | INHALATION_SPRAY | Freq: Four times a day (QID) | RESPIRATORY_TRACT | 1 refills | Status: DC | PRN
  Filled 2022-01-21: qty 18, 25d supply, fill #0

## 2022-01-21 NOTE — Progress Notes (Signed)
° °  Covid-19 Vaccination Clinic  Name:  Veronica Rogers    MRN: [id number] DOB: 16-Feb-1961  01/21/2022  [name] was observed post Covid-19 immunization for 15 minutes without incident. She was provided with Vaccine Information Sheet and instruction to access the V-Safe system.   [name] was instructed to call 911 with any severe reactions post vaccine: Difficulty breathing  Swelling of face and throat  A fast heartbeat  A bad rash all over body  Dizziness and weakness   Immunizations Administered     Name Date Dose VIS Date Route   Pfizer Covid-19 Vaccine Bivalent Booster 01/21/2022 12:39 PM 0.3 mL 08/29/2021 Intramuscular   Manufacturer: [place]   Lot: [id number]   [place]: [phone]

## 2022-01-22 ENCOUNTER — Other Ambulatory Visit (HOSPITAL_BASED_OUTPATIENT_CLINIC_OR_DEPARTMENT_OTHER): Payer: Self-pay

## 2022-01-22 MED ORDER — PFIZER COVID-19 VAC BIVALENT 30 MCG/0.3ML IM SUSP
INTRAMUSCULAR | 0 refills | Status: DC
  Filled 2022-01-22: qty 0.3, 1d supply, fill #0

## 2022-01-25 LAB — HM DIABETES EYE EXAM

## 2022-01-28 ENCOUNTER — Other Ambulatory Visit: Payer: Self-pay

## 2022-01-28 ENCOUNTER — Other Ambulatory Visit (HOSPITAL_COMMUNITY): Payer: Self-pay

## 2022-01-28 MED ORDER — OXYBUTYNIN CHLORIDE ER 10 MG PO TB24
10.0000 mg | ORAL_TABLET | ORAL | 4 refills | Status: DC
  Filled 2022-01-28: qty 90, 90d supply, fill #0
  Filled 2022-01-29: qty 5, 5d supply, fill #0
  Filled 2022-01-29: qty 90, 90d supply, fill #0
  Filled 2022-03-07: qty 90, 90d supply, fill #1
  Filled 2022-06-11: qty 90, 90d supply, fill #2
  Filled 2022-09-10: qty 40, 40d supply, fill #3
  Filled 2022-09-10: qty 50, 50d supply, fill #3
  Filled 2022-12-05: qty 90, 90d supply, fill #4

## 2022-01-29 ENCOUNTER — Other Ambulatory Visit (HOSPITAL_COMMUNITY): Payer: Self-pay

## 2022-02-06 ENCOUNTER — Encounter: Payer: Self-pay | Admitting: Internal Medicine

## 2022-02-15 ENCOUNTER — Other Ambulatory Visit (HOSPITAL_COMMUNITY): Payer: Self-pay

## 2022-02-15 ENCOUNTER — Other Ambulatory Visit: Payer: Self-pay

## 2022-02-15 ENCOUNTER — Ambulatory Visit (INDEPENDENT_AMBULATORY_CARE_PROVIDER_SITE_OTHER): Payer: No Typology Code available for payment source | Admitting: Family Medicine

## 2022-02-15 ENCOUNTER — Encounter: Payer: Self-pay | Admitting: Family Medicine

## 2022-02-15 VITALS — BP 122/70 | HR 90 | Temp 97.9°F | Ht 67.0 in | Wt 192.0 lb

## 2022-02-15 DIAGNOSIS — M26621 Arthralgia of right temporomandibular joint: Secondary | ICD-10-CM

## 2022-02-15 DIAGNOSIS — Z298 Encounter for other specified prophylactic measures: Secondary | ICD-10-CM | POA: Diagnosis not present

## 2022-02-15 DIAGNOSIS — Z2989 Encounter for other specified prophylactic measures: Secondary | ICD-10-CM | POA: Insufficient documentation

## 2022-02-15 MED ORDER — CELECOXIB 200 MG PO CAPS
200.0000 mg | ORAL_CAPSULE | Freq: Every day | ORAL | 1 refills | Status: AC
Start: 2022-02-15 — End: ?
  Filled 2022-02-15: qty 30, 30d supply, fill #0

## 2022-02-15 MED ORDER — AMOXICILLIN 500 MG PO CAPS
2000.0000 mg | ORAL_CAPSULE | ORAL | 0 refills | Status: DC
  Filled 2022-02-15: qty 12, 3d supply, fill #0

## 2022-02-15 NOTE — Progress Notes (Signed)
Established Patient Office Visit  Subjective:  Patient ID: Veronica Rogers, female    DOB: 03-16-1961  Age: 61 y.o. MRN: [id number]  CC:  Chief Complaint  Patient presents with   Temporomandibular Joint Pain    Possible TMJ right side of jaw bone very painful x 1 week.     HPI Veronica Rogers presents for evaluation of tenderness in the right side of her face just anterior to her ear.  Worse when she opens her mouth or chews food.  No injury.  History of bruxism.  She has a mouthguard but is learning to use it.  No recent dental trauma.  Mild postnasal drip but no other sinus symptoms.  Status post recent right knee replacement.  Takes amoxicillin before dental cleanings.  Past Medical History:  Diagnosis Date   Arthritis    Breast cancer ([place])    Contact lens/glasses fitting    wears contacts or glasses   Diabetes ([place]) 11/22/2014   GERD (gastroesophageal reflux disease)    Hyperlipidemia    Personal history of chemotherapy    Personal history of radiation therapy    Radiation 03/28/14-05/06/14   Left Breast 60 Gy    Past Surgical History:  Procedure Laterality Date   BREAST BIOPSY  1996   right breast, benign   BREAST LUMPECTOMY WITH NEEDLE LOCALIZATION AND AXILLARY SENTINEL LYMPH NODE BX Left 10/29/2013   Procedure: BREAST LUMPECTOMY WITH NEEDLE LOCALIZATION AND AXILLARY SENTINEL LYMPH NODE BX;  Surgeon: Rolm Bookbinder, MD;  Location: [facility];  Service: General;  Laterality: Left;   COLONOSCOPY  05/2011   WNL   TOTAL KNEE ARTHROPLASTY Right 09/11/2021   Procedure: TOTAL KNEE ARTHROPLASTY;  Surgeon: Paralee Cancel, MD;  Location: [facility];  Service: Orthopedics;  Laterality: Right;   WISDOM TOOTH EXTRACTION      Family History  Problem Relation Age of Onset   Squamous cell carcinoma Father    Cancer Father    Diabetes Father    Hypertension Mother    Diabetes Sister     Social History   Socioeconomic History   Marital status: Married     Spouse name: Not on file   Number of children: Not on file   Years of education: Not on file   Highest education level: Not on file  Occupational History   Not on file  Tobacco Use   Smoking status: Never   Smokeless tobacco: Never  Vaping Use   Vaping Use: Never used  Substance and Sexual Activity   Alcohol use: Yes    Comment: 1-2 drinks a month   Drug use: No   Sexual activity: Yes  Other Topics Concern   Not on file  Social History Narrative   Not on file   Social Determinants of Health   Financial Resource Strain: Not on file  Food Insecurity: Not on file  Transportation Needs: Not on file  Physical Activity: Not on file  Stress: Not on file  Social Connections: Not on file  Intimate Partner Violence: Not on file    Outpatient Medications Prior to Visit  Medication Sig Dispense Refill   albuterol (VENTOLIN HFA) 108 (90 Base) MCG/ACT inhaler Inhale 1-2 puffs into the lungs every 6 (six) hours as needed for wheezing or shortness of breath. 18 g 1   atorvastatin (LIPITOR) 10 MG tablet Take 1 tablet (10 mg total) by mouth daily. 90 tablet 0   celecoxib (CELEBREX) 200 MG capsule Take 1 capsule (200 mg  total) by mouth 2 (two) times daily. 60 capsule 0   glipiZIDE (GLUCOTROL) 5 MG tablet Take 1-2 tablets (5-10 mg total) by mouth daily with an evening meal 180 tablet 3   glucose blood (FREESTYLE LITE) test strip Use as directed twice daily for glucose testing 100 each 12   glucose monitoring kit (FREESTYLE) monitoring kit 1 each by Does not apply route as needed for other. UAD for bid blood glucose testing Dx: E11.65 1 each 1   hydrocortisone-pramoxine (ANALPRAM-HC) 2.5-1 % rectal cream Apply up to twice daily 30 g 3   Lancets (FREESTYLE) lancets UAD for bid blood glucose testing Dx: E11.65 100 each 12   Lidocaine-Hydrocort, Perianal, 3-0.5 % CREA Apply as needed up to twice daily. 98 g 3   metFORMIN (GLUCOPHAGE-XR) 750 MG 24 hr tablet Take 2 tablets (1,500 mg total) by mouth  at bedtime. 180 tablet 3   oxybutynin (DITROPAN-XL) 10 MG 24 hr tablet TAKE 1 TABLET EVERY DAY BY MOUTH IN THE MORNING 90 tablet 3   oxybutynin (DITROPAN-XL) 10 MG 24 hr tablet Take 1 tablet by mouth once daily at mid morning 90 tablet 4   Semaglutide (RYBELSUS) 7 MG TABS TAKE 1 TABLET BY MOUTH ONCE DAILY BEFORE BREAKFAST. 90 tablet 3   sertraline (ZOLOFT) 50 MG tablet Take 1 tablet (50 mg total) by mouth daily. 90 tablet 3   tamoxifen (NOLVADEX) 20 MG tablet Take 1 tablet by mouth daily. 90 tablet 3   amoxicillin (AMOXIL) 500 MG capsule Take 4 capsules (2,000 mg total) by mouth as directed 1 hour prior to dental appointment 12 capsule 0   COVID-19 mRNA bivalent vaccine, Pfizer, (PFIZER COVID-19 VAC BIVALENT) injection Inject into the muscle. 0.3 mL 0   No facility-administered medications prior to visit.    Allergies  Allergen Reactions   Erythromycin Other (See Comments)    Stomach pain   B-Complex-B-12 [B Complex Vitamins] Itching and Rash    Itchy skin   Tape Rash    ROS Review of Systems  Constitutional:  Negative for chills, diaphoresis, fatigue, fever and unexpected weight change.  HENT:  Positive for postnasal drip. Negative for congestion, dental problem, ear discharge, ear pain, rhinorrhea and sinus pain.   Eyes:  Negative for photophobia.  Respiratory: Negative.    Cardiovascular: Negative.   Gastrointestinal: Negative.   Endocrine: Negative for polyphagia and polyuria.  Musculoskeletal:  Positive for arthralgias. Negative for gait problem and joint swelling.  Neurological:  Negative for speech difficulty and weakness.  Psychiatric/Behavioral: Negative.       Objective:    Physical Exam Vitals and nursing note reviewed.  Constitutional:      General: She is not in acute distress.    Appearance: Normal appearance. She is not ill-appearing, toxic-appearing or diaphoretic.  HENT:     Head: Normocephalic and atraumatic.      Right Ear: External ear normal.      Left Ear: External ear normal.     Mouth/Throat:     Mouth: Mucous membranes are moist.     Pharynx: Oropharynx is clear. No oropharyngeal exudate or posterior oropharyngeal erythema.  Eyes:     Extraocular Movements: Extraocular movements intact.     Conjunctiva/sclera: Conjunctivae normal.     Pupils: Pupils are equal, round, and reactive to light.  Neck:     Vascular: No carotid bruit.  Cardiovascular:     Rate and Rhythm: Normal rate and regular rhythm.  Pulmonary:     Effort: Pulmonary effort  is normal.     Breath sounds: Normal breath sounds.  Abdominal:     General: Bowel sounds are normal.  Musculoskeletal:     Cervical back: No rigidity or tenderness.  Lymphadenopathy:     Cervical: No cervical adenopathy.  Skin:    General: Skin is warm and dry.  Neurological:     Mental Status: She is alert and oriented to person, place, and time.  Psychiatric:        Mood and Affect: Mood normal.        Behavior: Behavior normal.    BP 122/70 (BP Location: Right Arm, Patient Position: Sitting, Cuff Size: Normal)    Pulse 90    Temp 97.9 F (36.6 C) (Temporal)    Ht _0  (1.702 m)    Wt 192 lb (87.1 kg)    LMP 11/08/2013    SpO2 95%    BMI 30.07 kg/m  Wt Readings from Last 3 Encounters:  02/15/22 192 lb (87.1 kg)  12/28/21 191 lb 6.4 oz (86.8 kg)  10/26/21 191 lb 6.4 oz (86.8 kg)     Health Maintenance Due  Topic Date Due   Zoster Vaccines- Shingrix (1 of 2) Never done   FOOT EXAM  07/25/2017   COLONOSCOPY (Pts 45-23yr Insurance coverage will need to be confirmed)  05/30/2021    There are no preventive care reminders to display for this patient.  Lab Results  Component Value Date   TSH 1.86 09/13/2019   Lab Results  Component Value Date   WBC 7.9 12/28/2021   HGB 11.6 (L) 12/28/2021   HCT 36.0 12/28/2021   MCV 79.8 (L) 12/28/2021   PLT 234 12/28/2021   Lab Results  Component Value Date   NA 141 12/28/2021   K 4.0 12/28/2021   CHLORIDE 103 01/17/2015   CO2  22 12/28/2021   GLUCOSE 89 12/28/2021   BUN 16 12/28/2021   CREATININE 0.67 12/28/2021   BILITOT 0.4 12/28/2021   ALKPHOS 30 (L) 08/02/2021   AST 14 12/28/2021   ALT 10 12/28/2021   PROT 6.2 12/28/2021   ALBUMIN 4.1 08/02/2021   CALCIUM 9.6 12/28/2021   ANIONGAP 7 09/12/2021   EGFR >90 01/17/2015   GFR 83.72 05/08/2021   Lab Results  Component Value Date   CHOL 149 12/28/2021   Lab Results  Component Value Date   HDL 52 12/28/2021   Lab Results  Component Value Date   LDLCALC 77 12/28/2021   Lab Results  Component Value Date   TRIG 122 12/28/2021   Lab Results  Component Value Date   CHOLHDL 2.9 12/28/2021   Lab Results  Component Value Date   HGBA1C 6.3 (A) 10/26/2021      Assessment & Plan:   Problem List Items Addressed This Visit       Musculoskeletal and Integument   Arthralgia of right temporomandibular joint - Primary   Relevant Medications   celecoxib (CELEBREX) 200 MG capsule     Other   Need for SBE (subacute bacterial endocarditis) prophylaxis   Relevant Medications   amoxicillin (AMOXIL) 500 MG capsule    Meds ordered this encounter  Medications   celecoxib (CELEBREX) 200 MG capsule    Sig: Take 1 capsule (200 mg total) by mouth daily. For 2 weeks and stop.    Dispense:  30 capsule    Refill:  1   amoxicillin (AMOXIL) 500 MG capsule    Sig: Take 4 capsules (2,000 mg total) by mouth as  directed 1 hour prior to dental appointment    Dispense:  12 capsule    Refill:  0    Follow-up: Return if symptoms worsen or fail to improve.    Libby Maw, MD

## 2022-02-27 ENCOUNTER — Other Ambulatory Visit (HOSPITAL_COMMUNITY): Payer: Self-pay

## 2022-02-27 MED ORDER — FLUCONAZOLE 150 MG PO TABS
150.0000 mg | ORAL_TABLET | ORAL | 0 refills | Status: DC
  Filled 2022-02-27: qty 2, 6d supply, fill #0

## 2022-03-07 ENCOUNTER — Other Ambulatory Visit (HOSPITAL_COMMUNITY): Payer: Self-pay

## 2022-03-14 ENCOUNTER — Encounter: Payer: Self-pay | Admitting: Family Medicine

## 2022-03-28 ENCOUNTER — Encounter: Payer: Self-pay | Admitting: Internal Medicine

## 2022-03-28 ENCOUNTER — Ambulatory Visit: Payer: No Typology Code available for payment source | Admitting: Internal Medicine

## 2022-03-28 VITALS — BP 128/92 | HR 60 | Ht 67.0 in | Wt 189.4 lb

## 2022-03-28 DIAGNOSIS — E663 Overweight: Secondary | ICD-10-CM

## 2022-03-28 DIAGNOSIS — E1165 Type 2 diabetes mellitus with hyperglycemia: Secondary | ICD-10-CM

## 2022-03-28 DIAGNOSIS — E785 Hyperlipidemia, unspecified: Secondary | ICD-10-CM

## 2022-03-28 LAB — POCT GLYCOSYLATED HEMOGLOBIN (HGB A1C): Hemoglobin A1C: 7.1 % — AB (ref 4.0–5.6)

## 2022-03-28 NOTE — Progress Notes (Signed)
Patient ID: BENITA BOONSTRA, female   DOB: [birth year], 61 y.o.   MRN: [id number]  ? ?This visit occurred during the SARS-CoV-2 public health emergency.  Safety protocols were in place, including screening questions prior to the visit, additional usage of staff PPE, and extensive cleaning of exam room while observing appropriate contact time as indicated for disinfecting solutions.  ? ?HPI: ?DAYANIS BERGQUIST is a 61 y.o.-year-old female, presenting presenting for f/u for DM2, dx in 2015 (after steroids), non-insulin-dependent, uncontrolled, without long term complications. Last visit 5 months ago. ? ?Interim history: ?No increased urination, blurry vision, nausea, chest pain. ?She stopped snacks before last visit but relaxed her diet since last visit.  ?She is preparing to go on a cruise next months. ? ?Reviewed HbA1c levels: ?Lab Results  ?Component Value Date  ? HGBA1C 6.3 (A) 10/26/2021  ? HGBA1C 6.7 (H) 08/02/2021  ? HGBA1C 6.9 (A) 05/08/2021  ? ?Pt is on a regimen of: ? - Metformin ER 1500 mg with dinner ?- Januvia 100 mg before breakfast >> Rybelsus 7  mg before breakfast ?- Glipizide 5 mg before dinner >> Before a larger meal ?She was on insulin during ChTx. ?She was on regular metformin >> GI upset (diarrhea). ?She has frequent urination. ? ?Pt checks her sugars 1-3 times a day: ?- am: 128-166 (forgot metformin) >> 118-125 >> 110-138, 12/2021: 144-157 ?- 2h after b'fast:95-101 >> 120 >> n/c >> 115 ?- before lunch: 89 >> 109-114 >> n/c >> 125-130 >> n/c ?- 2h after lunch:n/c >> 140-152 >> n/c >> 140-149 >> n/c ?- before dinner: 134, 135 >> n/c >> 122, 140, 186 >> 150  >> n/c ?- 2h after dinner: 152 >> n/c >> 160s >> n/c >> 189 >> n/c ?- bedtime: n/c  >> 125-135 ?- nighttime: 85, 89, 98, 120 >> 114 >> n/c >> 119 >> n/c ?Lowest sugar was 85 >> 109 >> 128 >> 118 >> 110;  she has hypoglycemia awareness in the 70s. ?Highest sugar was  189 >> 166 >> 200 x1 >> 157. ? ?Glucometer: True test >> AccuChek guide >>  Freestyle Lite ? ?Pt's meals are: ?- Breakfast: cereals or 2 eggs or skips >> cereals ?-- Lunch: leftovers from home: sandwich; soup; meat + veggies; chinese; salad ?- Dinner: eats out most dinners! ? ?-No HL, last BUN/creatinine:  ?Lab Results  ?Component Value Date  ? BUN 16 12/28/2021  ? CREATININE 0.67 12/28/2021  ? ?-+HL; last set of lipids: ?Lab Results  ?Component Value Date  ? CHOL 149 12/28/2021  ? HDL 52 12/28/2021  ? [place] 77 12/28/2021  ? LDLDIRECT 73 12/28/2021  ? TRIG 122 12/28/2021  ? CHOLHDL 2.9 12/28/2021  ?Not on a statin.  Continues on fenofibrate 48. ? ?- last eye exam was on 12/2021: No DR. Dr. Katy Fitch.   ? ?-She denies numbness and tingling in her feet.  She has a history of plantar fasciitis. ? ?H/o BrCA 2014-2015. On Tamoxifen. ? ?ROS: ?+ see HPI ?+ Acid reflux ? ?I reviewed pt's medications, allergies, PMH, social hx, family hx, and changes were documented in the history of present illness. Otherwise, unchanged from my initial visit note. ? ?Past Medical History:  ?Diagnosis Date  ? Arthritis   ? Breast cancer ([place])   ? Contact lens/glasses fitting   ? wears contacts or glasses  ? Diabetes ([place]) 11/22/2014  ? GERD (gastroesophageal reflux disease)   ? Hyperlipidemia   ? Personal history of chemotherapy   ? Personal history  of radiation therapy   ? Radiation 03/28/14-05/06/14  ? Left Breast 60 Gy  ? ?Past Surgical History:  ?Procedure Laterality Date  ? BREAST BIOPSY  1996  ? right breast, benign  ? BREAST LUMPECTOMY WITH NEEDLE LOCALIZATION AND AXILLARY SENTINEL LYMPH NODE BX Left 10/29/2013  ? Procedure: BREAST LUMPECTOMY WITH NEEDLE LOCALIZATION AND AXILLARY SENTINEL LYMPH NODE BX;  Surgeon: Rolm Bookbinder, MD;  Location: [facility];  Service: General;  Laterality: Left;  ? COLONOSCOPY  05/2011  ? WNL  ? TOTAL KNEE ARTHROPLASTY Right 09/11/2021  ? Procedure: TOTAL KNEE ARTHROPLASTY;  Surgeon: Paralee Cancel, MD;  Location: [facility];  Service: Orthopedics;  Laterality: Right;  ?  WISDOM TOOTH EXTRACTION    ? ?Social History  ? ?Social History  ? Marital Status: Married  ?  Spouse Name: N/A  ? Number of Children: 2  ? 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 ?Social History Main Topics  ? Smoking status: Never Smoker   ? Smokeless tobacco: Never Used  ? Alcohol Use: Yes  ?   Comment: 1-2 drinks a month  ? Drug Use: No  ? ?Current Outpatient Medications on File Prior to Visit  ?Medication Sig Dispense Refill  ? albuterol (VENTOLIN HFA) 108 (90 Base) MCG/ACT inhaler Inhale 1-2 puffs into the lungs every 6 (six) hours as needed for wheezing or shortness of breath. 18 g 1  ? amoxicillin (AMOXIL) 500 MG capsule Take 4 capsules (2,000 mg total) by mouth as directed 1 hour prior to dental appointment 12 capsule 0  ? atorvastatin (LIPITOR) 10 MG tablet Take 1 tablet (10 mg total) by mouth daily. 90 tablet 0  ? celecoxib (CELEBREX) 200 MG capsule Take 1 capsule (200 mg total) by mouth 2 (two) times daily. 60 capsule 0  ? celecoxib (CELEBREX) 200 MG capsule Take 1 capsule (200 mg total) by mouth daily. For 2 weeks and stop. 30 capsule 1  ? fluconazole (DIFLUCAN) 150 MG tablet Take 1 tablet (150 mg total) by mouth every 3 (three) days. 2 tablet 0  ? glipiZIDE (GLUCOTROL) 5 MG tablet Take 1-2 tablets (5-10 mg total) by mouth daily with an evening meal 180 tablet 3  ? glucose blood (FREESTYLE LITE) test strip Use as directed twice daily for glucose testing 100 each 12  ? glucose monitoring kit (FREESTYLE) monitoring kit 1 each by Does not apply route as needed for other. UAD for bid blood glucose testing Dx: E11.65 1 each 1  ? hydrocortisone-pramoxine (ANALPRAM-HC) 2.5-1 % rectal cream Apply up to twice daily 30 g 3  ? Lancets (FREESTYLE) lancets UAD for bid blood glucose testing Dx: E11.65 100 each 12  ? Lidocaine-Hydrocort, Perianal, 3-0.5 % CREA Apply as needed up to twice daily. 98 g 3  ? metFORMIN (GLUCOPHAGE-XR) 750 MG 24 hr tablet Take 2 tablets (1,500 mg total) by  mouth at bedtime. 180 tablet 3  ? oxybutynin (DITROPAN-XL) 10 MG 24 hr tablet Take 1 tablet (10 mg total) by mouth daily in the mid morning. 90 tablet 4  ? Semaglutide (RYBELSUS) 7 MG TABS TAKE 1 TABLET BY MOUTH ONCE DAILY BEFORE BREAKFAST. 90 tablet 3  ? sertraline (ZOLOFT) 50 MG tablet Take 1 tablet (50 mg total) by mouth daily. 90 tablet 3  ? tamoxifen (NOLVADEX) 20 MG tablet Take 1 tablet by mouth daily. 90 tablet 3  ? ?No current facility-administered medications on file prior to visit.  ? ?Allergies  ?Allergen Reactions  ?  Erythromycin Other (See Comments)  ?  Stomach pain  ? B-Complex-B-12 [B Complex Vitamins] Itching and Rash  ?  Itchy skin  ? Tape Rash  ? ?Family History  ?Problem Relation Age of Onset  ? Squamous cell carcinoma Father   ? Cancer Father   ? Diabetes Father   ? Hypertension Mother   ? Diabetes Sister   ? ?PE: ?BP (!) 128/92 (BP Location: Right Arm, Patient Position: Sitting, Cuff Size: Normal)   Pulse 60   Ht _0  (1.702 m)   Wt 189 lb 6.4 oz (85.9 kg)   LMP 11/08/2013   SpO2 97%   BMI 29.66 kg/m?  Body mass index is 29.66 kg/m?. ?Wt Readings from Last 3 Encounters:  ?03/28/22 189 lb 6.4 oz (85.9 kg)  ?02/15/22 192 lb (87.1 kg)  ?12/28/21 191 lb 6.4 oz (86.8 kg)  ? ?Constitutional: overweight, in NAD ?Eyes: PERRLA, EOMI, no exophthalmos ?ENT: moist mucous membranes, no thyromegaly, no cervical lymphadenopathy ?Cardiovascular: RRR, No MRG ?Respiratory: CTA B ?Musculoskeletal: no deformities, strength intact in all 4 ?Skin: moist, warm, no rashes, R knee surgical scar healed ?Neurological: no tremor with outstretched hands, DTR normal in all 4 ?Diabetic Foot Exam - Simple   ?Simple Foot Form ?Diabetic Foot exam was performed with the following findings: Yes 03/28/2022 10:00 AM  ?Visual Inspection ?No deformities, no ulcerations, no other skin breakdown bilaterally: Yes ?Sensation Testing ?Intact to touch and monofilament testing bilaterally: Yes ?Pulse Check ?Posterior Tibialis and  Dorsalis pulse intact bilaterally: Yes ?Comments ?  ? ? ?ASSESSMENT: ?1. DM2, non-insulin-dependent, uncontrolled, without long term complications, but with hyperglycemia ? ?No FH of MTC or personal hx of pancreatitis. ?

## 2022-03-28 NOTE — Patient Instructions (Addendum)
Please continue: ? - Metformin ER 1500 mg with dinner ?- Glipizide 5 mg - before a larger dinner ?- Rybelsus 7 mg before b'fast ? ?Try to check sugars 1x a day and write them down. ? ?Try to reduce fatty foods. ? ?Please return in 4-5 months with your sugar log. ?

## 2022-04-01 ENCOUNTER — Other Ambulatory Visit (HOSPITAL_COMMUNITY): Payer: Self-pay

## 2022-04-02 ENCOUNTER — Other Ambulatory Visit (HOSPITAL_COMMUNITY): Payer: Self-pay

## 2022-04-29 ENCOUNTER — Other Ambulatory Visit: Payer: Self-pay | Admitting: Hematology and Oncology

## 2022-04-29 ENCOUNTER — Encounter: Payer: Self-pay | Admitting: Internal Medicine

## 2022-04-29 DIAGNOSIS — Z1231 Encounter for screening mammogram for malignant neoplasm of breast: Secondary | ICD-10-CM

## 2022-04-30 ENCOUNTER — Other Ambulatory Visit: Payer: Self-pay | Admitting: Family Medicine

## 2022-04-30 ENCOUNTER — Other Ambulatory Visit: Payer: Self-pay

## 2022-04-30 ENCOUNTER — Other Ambulatory Visit (HOSPITAL_COMMUNITY): Payer: Self-pay

## 2022-04-30 DIAGNOSIS — E782 Mixed hyperlipidemia: Secondary | ICD-10-CM

## 2022-04-30 MED ORDER — ATORVASTATIN CALCIUM 10 MG PO TABS
10.0000 mg | ORAL_TABLET | Freq: Every day | ORAL | 0 refills | Status: DC
  Filled 2022-04-30: qty 90, 90d supply, fill #0

## 2022-05-28 ENCOUNTER — Other Ambulatory Visit: Payer: Self-pay | Admitting: Family Medicine

## 2022-06-11 ENCOUNTER — Other Ambulatory Visit (HOSPITAL_COMMUNITY): Payer: Self-pay

## 2022-06-13 ENCOUNTER — Ambulatory Visit
Admission: RE | Admit: 2022-06-13 | Discharge: 2022-06-13 | Disposition: A | Discharge status: Home / Self Care | Payer: No Typology Code available for payment source | Source: Ambulatory Visit | Attending: Hematology and Oncology | Admitting: Hematology and Oncology

## 2022-06-13 DIAGNOSIS — Z1231 Encounter for screening mammogram for malignant neoplasm of breast: Secondary | ICD-10-CM

## 2022-06-28 ENCOUNTER — Ambulatory Visit (INDEPENDENT_AMBULATORY_CARE_PROVIDER_SITE_OTHER): Payer: No Typology Code available for payment source | Admitting: Family Medicine

## 2022-06-28 ENCOUNTER — Encounter: Payer: Self-pay | Admitting: Family Medicine

## 2022-06-28 VITALS — BP 120/72 | HR 74 | Temp 97.3°F | Ht 67.0 in | Wt 189.8 lb

## 2022-06-28 DIAGNOSIS — D229 Melanocytic nevi, unspecified: Secondary | ICD-10-CM | POA: Insufficient documentation

## 2022-06-28 DIAGNOSIS — E782 Mixed hyperlipidemia: Secondary | ICD-10-CM | POA: Diagnosis not present

## 2022-06-28 DIAGNOSIS — D509 Iron deficiency anemia, unspecified: Secondary | ICD-10-CM

## 2022-06-28 DIAGNOSIS — E538 Deficiency of other specified B group vitamins: Secondary | ICD-10-CM

## 2022-06-28 DIAGNOSIS — Z1211 Encounter for screening for malignant neoplasm of colon: Secondary | ICD-10-CM

## 2022-06-28 LAB — COMPREHENSIVE METABOLIC PANEL
ALT: 15 U/L (ref 0–35)
AST: 17 U/L (ref 0–37)
Albumin: 4.5 g/dL (ref 3.5–5.2)
Alkaline Phosphatase: 38 U/L — ABNORMAL LOW (ref 39–117)
BUN: 16 mg/dL (ref 6–23)
CO2: 28 mEq/L (ref 19–32)
Calcium: 9.8 mg/dL (ref 8.4–10.5)
Chloride: 103 mEq/L (ref 96–112)
Creatinine, Ser: 0.66 mg/dL (ref 0.40–1.20)
GFR: 94.45 mL/min (ref >60.00)
Glucose, Bld: 99 mg/dL (ref 70–99)
Potassium: 4.3 mEq/L (ref 3.5–5.1)
Sodium: 140 mEq/L (ref 135–145)
Total Bilirubin: 0.6 mg/dL (ref 0.2–1.2)
Total Protein: 7.2 g/dL (ref 6.0–8.3)

## 2022-06-28 LAB — LIPID PANEL
Cholesterol: 144 mg/dL (ref 0–200)
HDL: 50.2 mg/dL (ref >39.00)
LDL Cholesterol: 65 mg/dL (ref 0–99)
NonHDL: 93.44
Total CHOL/HDL Ratio: 3
Triglycerides: 141 mg/dL (ref 0.0–149.0)
VLDL: 28.2 mg/dL (ref 0.0–40.0)

## 2022-06-28 LAB — CBC
HCT: 37.6 % (ref 36.0–46.0)
Hemoglobin: 12 g/dL (ref 12.0–15.0)
MCHC: 32 g/dL (ref 30.0–36.0)
MCV: 82.7 fl (ref 78.0–100.0)
Platelets: 202 10*3/uL (ref 150.0–400.0)
RBC: 4.54 Mil/uL (ref 3.87–5.11)
RDW: 14.5 % (ref 11.5–15.5)
WBC: 7.2 10*3/uL (ref 4.0–10.5)

## 2022-06-28 LAB — B12 AND FOLATE PANEL
Folate: 19.6 ng/mL (ref >5.9)
Vitamin B-12: 139 pg/mL — ABNORMAL LOW (ref 211–911)

## 2022-06-28 NOTE — Progress Notes (Signed)
Established Patient Office Visit  Subjective   Patient ID: Veronica Rogers, female    DOB: 22-Jul-1961  Age: 61 y.o. MRN: [id number]  Chief Complaint  Patient presents with   Follow-up    6 month follow, would like mole under right breast checked no pains.     HPI follow-up of hyperlipidemia, microcytic anemia and concern about breast.  Doing well with low-dose atorvastatin.  6 hours fasting.  Due for colonoscopy.  Has seen no blood in her stool or urine.  Denies melena.  5 year survivor of breast cancer.  Knee is much better after her recent replacement.  Developed a rash with a B12 supplement.    Review of Systems  Constitutional:  Negative for chills, diaphoresis, malaise/fatigue and weight loss.  HENT: Negative.    Eyes: Negative.  Negative for blurred vision and double vision.  Cardiovascular:  Negative for chest pain.  Gastrointestinal:  Negative for abdominal pain.  Genitourinary: Negative.   Musculoskeletal:  Negative for falls and myalgias.  Neurological:  Negative for speech change, loss of consciousness and weakness.  Psychiatric/Behavioral: Negative.        Objective:     BP 120/72 (BP Location: Right Arm, Patient Position: Sitting, Cuff Size: Large)   Pulse 74   Temp (!) 97.3 F (36.3 C) (Temporal)   Ht '5\' 7"'$  (1.702 m)   Wt 189 lb 12.8 oz (86.1 kg)   LMP 11/08/2013   SpO2 94%   BMI 29.73 kg/m    Physical Exam Constitutional:      General: She is not in acute distress.    Appearance: Normal appearance. She is not ill-appearing, toxic-appearing or diaphoretic.  HENT:     Head: Normocephalic and atraumatic.     Right Ear: External ear normal.     Left Ear: External ear normal.     Mouth/Throat:     Mouth: Mucous membranes are moist.     Pharynx: Oropharynx is clear. No oropharyngeal exudate or posterior oropharyngeal erythema.  Eyes:     General: No scleral icterus.       Right eye: No discharge.        Left eye: No discharge.     Extraocular  Movements: Extraocular movements intact.     Conjunctiva/sclera: Conjunctivae normal.     Pupils: Pupils are equal, round, and reactive to light.  Cardiovascular:     Rate and Rhythm: Normal rate and regular rhythm.  Pulmonary:     Effort: Pulmonary effort is normal. No respiratory distress.     Breath sounds: Normal breath sounds.  Musculoskeletal:     Cervical back: No rigidity or tenderness.  Skin:    General: Skin is warm and dry.       Neurological:     Mental Status: She is alert and oriented to person, place, and time.  Psychiatric:        Mood and Affect: Mood normal.        Behavior: Behavior normal.      Results for orders placed or performed in visit on 06/28/22  B12 and Folate Panel  Result Value Ref Range   Vitamin B-12 139 (L) 211 - 911 pg/mL   Folate 19.6 >5.9 ng/mL  Iron, TIBC and Ferritin Panel  Result Value Ref Range   Iron 72 45 - 160 mcg/dL   TIBC 425 250 - 450 mcg/dL (calc)   %SAT 17 16 - 45 % (calc)   Ferritin 39 16 - 288 ng/mL  CBC  Result Value Ref Range   WBC 7.2 4.0 - 10.5 K/uL   RBC 4.54 3.87 - 5.11 Mil/uL   Platelets 202.0 150.0 - 400.0 K/uL   Hemoglobin 12.0 12.0 - 15.0 g/dL   HCT 37.6 36.0 - 46.0 %   MCV 82.7 78.0 - 100.0 fl   MCHC 32.0 30.0 - 36.0 g/dL   RDW 14.5 11.5 - 15.5 %  Lipid panel  Result Value Ref Range   Cholesterol 144 0 - 200 mg/dL   Triglycerides 141.0 0.0 - 149.0 mg/dL   HDL 50.20 >39.00 mg/dL   VLDL 28.2 0.0 - 40.0 mg/dL   LDL Cholesterol 65 0 - 99 mg/dL   Total CHOL/HDL Ratio 3    NonHDL 93.44   Comprehensive metabolic panel  Result Value Ref Range   Sodium 140 135 - 145 mEq/L   Potassium 4.3 3.5 - 5.1 mEq/L   Chloride 103 96 - 112 mEq/L   CO2 28 19 - 32 mEq/L   Glucose, Bld 99 70 - 99 mg/dL   BUN 16 6 - 23 mg/dL   Creatinine, Ser 0.66 0.40 - 1.20 mg/dL   Total Bilirubin 0.6 0.2 - 1.2 mg/dL   Alkaline Phosphatase 38 (L) 39 - 117 U/L   AST 17 0 - 37 U/L   ALT 15 0 - 35 U/L   Total Protein 7.2 6.0 - 8.3  g/dL   Albumin 4.5 3.5 - 5.2 g/dL   GFR 94.45 >60.00 mL/min   Calcium 9.8 8.4 - 10.5 mg/dL      The 10-year ASCVD risk score (Arnett DK, et al., 2019) is: 5.2%    Assessment & Plan:   Problem List Items Addressed This Visit       Musculoskeletal and Integument   Atypical nevus   Relevant Orders   Ambulatory referral to Dermatology     Other   Hyperlipidemia   Relevant Orders   Lipid panel (Completed)   Comprehensive metabolic panel (Completed)   Colon cancer screening - Primary   Relevant Orders   Ambulatory referral to Gastroenterology   Microcytic anemia   Relevant Medications   vitamin B-12 (CYANOCOBALAMIN) 1000 MCG tablet   Other Relevant Orders   B12 and Folate Panel (Completed)   Iron, TIBC and Ferritin Panel (Completed)   CBC (Completed)   Other Visit Diagnoses     B12 deficiency           Return in about 6 months (around 12/28/2022).  Continue atorvastatin low-fat low-cholesterol diet.  Checking B12 and iron levels.  Claims that her reactions probably to constituent of her B12 supplement instead of the vitamin itself.  We will send for: Noscapine that is due.  Derm referral for atypical nevi.  Libby Maw, MD

## 2022-06-29 LAB — IRON,TIBC AND FERRITIN PANEL
%SAT: 17 % (calc) (ref 16–45)
Ferritin: 39 ng/mL (ref 16–288)
Iron: 72 ug/dL (ref 45–160)
TIBC: 425 mcg/dL (calc) (ref 250–450)

## 2022-07-01 ENCOUNTER — Other Ambulatory Visit (HOSPITAL_COMMUNITY): Payer: Self-pay

## 2022-07-01 MED ORDER — VITAMIN B-12 1000 MCG PO TABS
1000.0000 ug | ORAL_TABLET | Freq: Every day | ORAL | 1 refills | Status: DC
  Filled 2022-07-01 – 2022-07-22 (×2): qty 90, 90d supply, fill #0

## 2022-07-01 NOTE — Addendum Note (Signed)
Addended by: Jon Billings on: 07/01/2022 07:46 AM   Modules accepted: Orders

## 2022-07-08 ENCOUNTER — Other Ambulatory Visit (HOSPITAL_COMMUNITY): Payer: Self-pay

## 2022-07-08 MED ORDER — SERTRALINE HCL 50 MG PO TABS
50.0000 mg | ORAL_TABLET | Freq: Every day | ORAL | 3 refills | Status: DC
  Filled 2022-07-08: qty 90, 90d supply, fill #0
  Filled 2022-09-30: qty 90, 90d supply, fill #1
  Filled 2022-12-25: qty 90, 90d supply, fill #2
  Filled 2023-03-31: qty 90, 90d supply, fill #3

## 2022-07-17 ENCOUNTER — Encounter: Payer: Self-pay | Admitting: Family Medicine

## 2022-07-22 ENCOUNTER — Other Ambulatory Visit: Payer: Self-pay | Admitting: Internal Medicine

## 2022-07-22 ENCOUNTER — Other Ambulatory Visit: Payer: Self-pay | Admitting: Family Medicine

## 2022-07-22 ENCOUNTER — Other Ambulatory Visit (HOSPITAL_COMMUNITY): Payer: Self-pay

## 2022-07-22 DIAGNOSIS — Z298 Encounter for other specified prophylactic measures: Secondary | ICD-10-CM

## 2022-07-22 DIAGNOSIS — E1165 Type 2 diabetes mellitus with hyperglycemia: Secondary | ICD-10-CM

## 2022-07-22 DIAGNOSIS — E782 Mixed hyperlipidemia: Secondary | ICD-10-CM

## 2022-07-23 ENCOUNTER — Other Ambulatory Visit (HOSPITAL_COMMUNITY): Payer: Self-pay

## 2022-07-23 ENCOUNTER — Other Ambulatory Visit: Payer: Self-pay

## 2022-07-23 DIAGNOSIS — E782 Mixed hyperlipidemia: Secondary | ICD-10-CM

## 2022-07-23 MED ORDER — ATORVASTATIN CALCIUM 10 MG PO TABS
10.0000 mg | ORAL_TABLET | Freq: Every day | ORAL | 0 refills | Status: DC
  Filled 2022-07-23: qty 90, 90d supply, fill #0

## 2022-07-23 MED ORDER — FREESTYLE LITE TEST VI STRP
ORAL_STRIP | 12 refills | Status: DC
  Filled 2022-07-23: qty 100, 50d supply, fill #0

## 2022-07-30 ENCOUNTER — Encounter: Payer: Self-pay | Admitting: Gastroenterology

## 2022-08-01 ENCOUNTER — Ambulatory Visit (INDEPENDENT_AMBULATORY_CARE_PROVIDER_SITE_OTHER): Payer: No Typology Code available for payment source | Admitting: Internal Medicine

## 2022-08-01 ENCOUNTER — Encounter: Payer: Self-pay | Admitting: Internal Medicine

## 2022-08-01 ENCOUNTER — Other Ambulatory Visit (HOSPITAL_COMMUNITY): Payer: Self-pay

## 2022-08-01 VITALS — BP 130/84 | HR 91 | Ht 67.0 in | Wt 189.8 lb

## 2022-08-01 DIAGNOSIS — E663 Overweight: Secondary | ICD-10-CM

## 2022-08-01 DIAGNOSIS — E785 Hyperlipidemia, unspecified: Secondary | ICD-10-CM | POA: Diagnosis not present

## 2022-08-01 DIAGNOSIS — E1165 Type 2 diabetes mellitus with hyperglycemia: Secondary | ICD-10-CM

## 2022-08-01 LAB — POCT GLYCOSYLATED HEMOGLOBIN (HGB A1C): Hemoglobin A1C: 7.4 % — AB (ref 4.0–5.6)

## 2022-08-01 MED ORDER — RYBELSUS 14 MG PO TABS
14.0000 mg | ORAL_TABLET | Freq: Every day | ORAL | 3 refills | Status: DC
  Filled 2022-08-01 – 2022-08-13 (×3): qty 90, 90d supply, fill #0
  Filled 2022-11-07: qty 90, 90d supply, fill #1
  Filled 2023-02-18: qty 30, 30d supply, fill #2
  Filled 2023-03-31: qty 30, 30d supply, fill #3
  Filled 2023-05-02: qty 30, 30d supply, fill #4
  Filled 2023-05-28: qty 30, 30d supply, fill #5
  Filled 2023-06-27: qty 30, 30d supply, fill #6
  Filled 2023-07-27: qty 30, 30d supply, fill #7

## 2022-08-01 NOTE — Patient Instructions (Addendum)
Please continue:  - Metformin ER 1500 mg with dinner - Glipizide 5 mg - before a larger dinner  Please increase: - Rybelsus 14 mg before b'fast  Please return in 4 months with your sugar log.

## 2022-08-01 NOTE — Progress Notes (Signed)
Patient ID: Veronica Rogers, female   DOB: 1961/02/21, 61 y.o.   MRN: [id number]   HPI: Veronica Rogers is a 61 y.o.-year-old female, presenting presenting for f/u for DM2, dx in 2015 (after steroids), non-insulin-dependent, uncontrolled, without long term complications. Last visit 4 months ago.  Interim history: No increased urination, blurry vision, nausea, chest pain. Since last OV, she relaxed her diet: She had more fruit and also cocktails.  Reviewed HbA1c levels: Lab Results  Component Value Date   HGBA1C 7.1 (A) 03/28/2022   HGBA1C 6.3 (A) 10/26/2021   HGBA1C 6.7 (H) 08/02/2021   Pt is on a regimen of:  - Metformin ER 1500 mg with dinner - Januvia 100 mg before breakfast >> Rybelsus 7  mg before breakfast - Glipizide 5 mg before dinner >> Before a larger meal She was on insulin during ChTx. She was on regular metformin >> GI upset (diarrhea). She has frequent urination.  Pt checks her sugars 1-3 times a day: - am: 128-166 (forgot metformin) >> 118-125 >> 110-138, 144-157 >> 96, 124 - 2h after b'fast:95-101 >> 120 >> n/c >> 115 - before lunch: 89 >> 109-114 >> n/c >> 125-130 >> n/c >> 111, 138 - 2h after lunch:n/c >> 140-152 >> n/c >> 140-149 >> n/c >> 138 - before dinner: 134, 135 >> n/c >> 122, 140, 186 >> 150  >> n/c - 2h after dinner: 152 >> n/c >> 160s >> n/c >> 189 >> 127 - bedtime: n/c  >> 125-135 >> 96, 198 (pizza) - nighttime: 85, 89, 98, 120 >> 114 >> n/c >> 119 >> n/c Lowest sugar was 118 >> 110 >> 96;  she has hypoglycemia awareness in the 70s. Highest sugar was 200 x1 >> 157 >> 198.  Glucometer: True test >> AccuChek guide >> Freestyle Lite  Pt's meals are: - Breakfast: cereals or 2 eggs or skips >> cereals -- Lunch: leftovers from home: sandwich; soup; meat + veggies; chinese; salad - Dinner: eats out most dinners!  - No CKD, last BUN/creatinine:  Lab Results  Component Value Date   BUN 16 06/28/2022   CREATININE 0.66 06/28/2022   -+HL; last set of  lipids: Lab Results  Component Value Date   CHOL 144 06/28/2022   HDL 50.20 06/28/2022   LDLCALC 65 06/28/2022   LDLDIRECT 73 12/28/2021   TRIG 141.0 06/28/2022   CHOLHDL 3 06/28/2022  Not on a statin.  Continues on fenofibrate 48.  - last eye exam was on 12/2021: No DR. Dr. Katy Fitch.    -She denies numbness and tingling in her feet.  She has a history of plantar fasciitis.  Last foot exam 02/2022.  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 On Tamoxifen. She was found to have a low vitamin B12 on 06/28/2022: B12 139 - started on p.o. supplementation: 1000 mcg daily.  ROS: + see HPI + Acid reflux  I reviewed pt's medications, allergies, PMH, social hx, family hx, and changes were documented in the history of present illness. Otherwise, unchanged from my initial visit note.  Past Medical History:  Diagnosis Date   Arthritis    Breast cancer ([place])    Contact lens/glasses fitting    wears contacts or glasses   Diabetes ([place]) 11/22/2014   GERD (gastroesophageal reflux disease)    Hyperlipidemia    Personal history of chemotherapy    Personal history of radiation therapy    Radiation 03/28/14-05/06/14   Left Breast 60 Gy   Past Surgical History:  Procedure Laterality Date  BREAST BIOPSY  1996   right breast, benign   BREAST LUMPECTOMY WITH NEEDLE LOCALIZATION AND AXILLARY SENTINEL LYMPH NODE BX Left 10/29/2013   Procedure: BREAST LUMPECTOMY WITH NEEDLE LOCALIZATION AND AXILLARY SENTINEL LYMPH NODE BX;  Surgeon: Emelia Loron, MD;  Location: [facility];  Service: General;  Laterality: Left;   COLONOSCOPY  05/2011   WNL   TOTAL KNEE ARTHROPLASTY Right 09/11/2021   Procedure: TOTAL KNEE ARTHROPLASTY;  Surgeon: Durene Romans, MD;  Location: [facility];  Service: Orthopedics;  Laterality: Right;   WISDOM TOOTH EXTRACTION     Social History   Social History   Marital Status: Married    Spouse Name: N/A   Number of Children: 2   Occupational History   [employer] -  support rep   Social History Main Topics   Smoking status: Never Smoker    Smokeless tobacco: Never Used   Alcohol Use: Yes     Comment: 1-2 drinks a month   Drug Use: No   Current Outpatient Medications on File Prior to Visit  Medication Sig Dispense Refill   albuterol (VENTOLIN HFA) 108 (90 Base) MCG/ACT inhaler Inhale 1-2 puffs into the lungs every 6 (six) hours as needed for wheezing or shortness of breath. 18 g 1   amoxicillin (AMOXIL) 500 MG capsule Take 4 capsules (2,000 mg total) by mouth as directed 1 hour prior to dental appointment 12 capsule 0   atorvastatin (LIPITOR) 10 MG tablet Take 1 tablet (10 mg total) by mouth daily. 90 tablet 0   celecoxib (CELEBREX) 200 MG capsule Take 1 capsule (200 mg total) by mouth 2 (two) times daily. 60 capsule 0   celecoxib (CELEBREX) 200 MG capsule Take 1 capsule (200 mg total) by mouth daily. For 2 weeks and stop. 30 capsule 1   glipiZIDE (GLUCOTROL) 5 MG tablet Take 1-2 tablets (5-10 mg total) by mouth daily with an evening meal 180 tablet 3   glucose blood (FREESTYLE LITE) test strip Use as directed twice daily for glucose testing 100 each 12   glucose monitoring kit (FREESTYLE) monitoring kit 1 each by Does not apply route as needed for other. UAD for bid blood glucose testing Dx: E11.65 1 each 1   hydrocortisone-pramoxine (ANALPRAM-HC) 2.5-1 % rectal cream Apply up to twice daily 30 g 3   Lancets (FREESTYLE) lancets UAD for bid blood glucose testing Dx: E11.65 100 each 12   Lidocaine-Hydrocort, Perianal, 3-0.5 % CREA Apply as needed up to twice daily. 98 g 3   metFORMIN (GLUCOPHAGE-XR) 750 MG 24 hr tablet Take 2 tablets (1,500 mg total) by mouth at bedtime. 180 tablet 3   oxybutynin (DITROPAN-XL) 10 MG 24 hr tablet Take 1 tablet (10 mg total) by mouth daily in the mid morning. 90 tablet 4   Semaglutide (RYBELSUS) 7 MG TABS TAKE 1 TABLET BY MOUTH ONCE DAILY BEFORE BREAKFAST. 90 tablet 3   sertraline (ZOLOFT) 50 MG tablet Take 1 tablet (50 mg  total) by mouth daily. 90 tablet 3   tamoxifen (NOLVADEX) 20 MG tablet Take 1 tablet by mouth daily. 90 tablet 3   vitamin B-12 (CYANOCOBALAMIN) 1000 MCG tablet Take 1 tablet (1,000 mcg total) by mouth daily. 90 tablet 1   No current facility-administered medications on file prior to visit.   Allergies  Allergen Reactions   Erythromycin Other (See Comments)    Stomach pain   B-Complex-B-12 [B Complex Vitamins] Itching and Rash    Itchy skin  Tape Rash   Family History  Problem Relation Age of Onset   Squamous cell carcinoma Father    Cancer Father    Diabetes Father    Hypertension Mother    Diabetes Sister    PE: BP 130/84 (BP Location: Right Arm, Patient Position: Sitting, Cuff Size: Normal)   Pulse 91   Ht $R'5\' 7"'hr$  (1.702 m)   Wt 189 lb 12.8 oz (86.1 kg)   LMP 11/08/2013   SpO2 94%   BMI 29.73 kg/m   Wt Readings from Last 3 Encounters:  08/01/22 189 lb 12.8 oz (86.1 kg)  06/28/22 189 lb 12.8 oz (86.1 kg)  03/28/22 189 lb 6.4 oz (85.9 kg)   Constitutional: overweight, in NAD Eyes:  EOMI, no exophthalmos ENT: moist mucous membranes, no thyromegaly, no cervical lymphadenopathy Cardiovascular: RRR, No MRG Respiratory: CTA B Musculoskeletal: no deformities Skin: moist, warm, no rashes Neurological: no tremor with outstretched hands  ASSESSMENT: 1. DM2, non-insulin-dependent, uncontrolled, without long term complications, but with hyperglycemia  No FH of MTC or personal hx of pancreatitis.  2. HL  3.  Overweight  PLAN:  1. Patient with longstanding, well-controlled, type 2 diabetes, on oral antidiabetic regimen with metformin, sulfonylurea, and p.o. GLP-1 receptor agonist.  She tolerates Rybelsus well.  HbA1c decreased to 6.3% after addition of this medication, however, at last visit, this was higher, at 7.1%.  She was only checking sugars in the morning and had only few checks since the previous visit.  They were above goal.  I advised her to check consistently and  also check some sugars later in the day.  We did not change her regimen. -At today's visit, sugars appear to be higher.  She had 2 low blood sugars in the meter, but she feels that these were due to the meter having low battery.  She changed this since.  For now, my suggestion was to increase the Rybelsus dose, especially since she describes increased appetite.  We did discuss about Ozempic.  She would like to avoid this due to the fact that it is injectable.  She tells me her husband takes it with good results. - I suggested to:  Patient Instructions  Please continue:  - Metformin ER 1500 mg with dinner - Glipizide 5 mg - before a larger dinner  Please increase: - Rybelsus 14 mg before b'fast  Please return in 4-5 months with your sugar log.  - we checked her HbA1c: 7.4% (higher) - advised to check sugars at different times of the day - 1x a day, rotating check times - advised for yearly eye exams >> she is UTD - return to clinic in 4 months   2. HL -Reviewed latest lipid panel from 05/2022: All fractions at goal: Lab Results  Component Value Date   CHOL 144 06/28/2022   HDL 50.20 06/28/2022   LDLCALC 65 06/28/2022   LDLDIRECT 73 12/28/2021   TRIG 141.0 06/28/2022   CHOLHDL 3 06/28/2022  -Triglycerides improved from 700s -She is on fenofibrate 48 mg daily, but not taking consistently  3.  Overweight -We will continue Rybelsus which should also help with weight loss -She lost 2 pounds before last visit -At this visit, weight is stable  Philemon Kingdom, MD PhD [facility] Endocrinology

## 2022-08-02 ENCOUNTER — Other Ambulatory Visit (HOSPITAL_COMMUNITY): Payer: Self-pay

## 2022-08-06 ENCOUNTER — Other Ambulatory Visit (HOSPITAL_COMMUNITY): Payer: Self-pay

## 2022-08-12 ENCOUNTER — Other Ambulatory Visit (HOSPITAL_COMMUNITY): Payer: Self-pay

## 2022-08-13 ENCOUNTER — Ambulatory Visit (AMBULATORY_SURGERY_CENTER): Payer: Self-pay

## 2022-08-13 ENCOUNTER — Other Ambulatory Visit (HOSPITAL_COMMUNITY): Payer: Self-pay

## 2022-08-13 VITALS — Ht 67.0 in | Wt 189.0 lb

## 2022-08-13 DIAGNOSIS — Z1211 Encounter for screening for malignant neoplasm of colon: Secondary | ICD-10-CM

## 2022-08-13 MED ORDER — NA SULFATE-K SULFATE-MG SULF 17.5-3.13-1.6 GM/177ML PO SOLN
1.0000 | Freq: Once | ORAL | 0 refills | Status: AC
  Filled 2022-08-13: qty 354, 1d supply, fill #0

## 2022-08-13 NOTE — Progress Notes (Signed)
No egg or soy allergy known to patient  No issues known to pt with past sedation with any surgeries or procedures Patient denies ever being told they had issues or difficulty with intubation  No FH of Malignant Hyperthermia Pt is not on diet pills Pt is not on home 02  Pt is not on blood thinners  Pt denies issues with constipation  No A fib or A flutter Have any cardiac testing pending--NO Pt instructed to use [email or web address] or GoodRx for a price reduction on prep  Extra time spent with patient explaining prep instructions and also alternative foods she is allowed to eat 5 days prior to procedure;

## 2022-08-27 ENCOUNTER — Encounter: Payer: Self-pay | Admitting: Hematology and Oncology

## 2022-08-29 ENCOUNTER — Encounter: Payer: Self-pay | Admitting: Gastroenterology

## 2022-09-02 ENCOUNTER — Ambulatory Visit (HOSPITAL_COMMUNITY)
Admission: EM | Admit: 2022-09-02 | Discharge: 2022-09-02 | Disposition: A | Discharge status: Home / Self Care | Payer: No Typology Code available for payment source | Attending: Family Medicine | Admitting: Family Medicine

## 2022-09-02 ENCOUNTER — Encounter (HOSPITAL_BASED_OUTPATIENT_CLINIC_OR_DEPARTMENT_OTHER): Payer: Self-pay | Admitting: Emergency Medicine

## 2022-09-02 ENCOUNTER — Emergency Department (HOSPITAL_BASED_OUTPATIENT_CLINIC_OR_DEPARTMENT_OTHER)
Admission: EM | Admit: 2022-09-02 | Discharge: 2022-09-02 | Disposition: A | Discharge status: Home / Self Care | Payer: No Typology Code available for payment source | Attending: Emergency Medicine | Admitting: Emergency Medicine

## 2022-09-02 ENCOUNTER — Emergency Department (HOSPITAL_BASED_OUTPATIENT_CLINIC_OR_DEPARTMENT_OTHER): Payer: No Typology Code available for payment source

## 2022-09-02 ENCOUNTER — Encounter (HOSPITAL_COMMUNITY): Payer: Self-pay | Admitting: *Deleted

## 2022-09-02 DIAGNOSIS — R110 Nausea: Secondary | ICD-10-CM | POA: Diagnosis not present

## 2022-09-02 DIAGNOSIS — M25552 Pain in left hip: Secondary | ICD-10-CM | POA: Insufficient documentation

## 2022-09-02 DIAGNOSIS — R112 Nausea with vomiting, unspecified: Secondary | ICD-10-CM | POA: Insufficient documentation

## 2022-09-02 DIAGNOSIS — R1032 Left lower quadrant pain: Secondary | ICD-10-CM | POA: Insufficient documentation

## 2022-09-02 DIAGNOSIS — R519 Headache, unspecified: Secondary | ICD-10-CM | POA: Insufficient documentation

## 2022-09-02 LAB — COMPREHENSIVE METABOLIC PANEL
ALT: 11 U/L (ref 0–44)
AST: 13 U/L — ABNORMAL LOW (ref 15–41)
Albumin: 4.4 g/dL (ref 3.5–5.0)
Alkaline Phosphatase: 32 U/L — ABNORMAL LOW (ref 38–126)
Anion gap: 11 (ref 5–15)
BUN: 16 mg/dL (ref 8–23)
CO2: 27 mmol/L (ref 22–32)
Calcium: 9.6 mg/dL (ref 8.9–10.3)
Chloride: 103 mmol/L (ref 98–111)
Creatinine, Ser: 0.68 mg/dL (ref 0.44–1.00)
GFR, Estimated: >60 mL/min (ref >60)
Glucose, Bld: 114 mg/dL — ABNORMAL HIGH (ref 70–99)
Potassium: 3.6 mmol/L (ref 3.5–5.1)
Sodium: 141 mmol/L (ref 135–145)
Total Bilirubin: 0.9 mg/dL (ref 0.3–1.2)
Total Protein: 7 g/dL (ref 6.5–8.1)

## 2022-09-02 LAB — CBC WITH DIFFERENTIAL/PLATELET
Abs Immature Granulocytes: 0.02 10*3/uL (ref 0.00–0.07)
Basophils Absolute: 0 10*3/uL (ref 0.0–0.1)
Basophils Relative: 0 %
Eosinophils Absolute: 0.1 10*3/uL (ref 0.0–0.5)
Eosinophils Relative: 1 %
HCT: 37.7 % (ref 36.0–46.0)
Hemoglobin: 12.2 g/dL (ref 12.0–15.0)
Immature Granulocytes: 0 %
Lymphocytes Relative: 31 %
Lymphs Abs: 2.8 10*3/uL (ref 0.7–4.0)
MCH: 27.2 pg (ref 26.0–34.0)
MCHC: 32.4 g/dL (ref 30.0–36.0)
MCV: 84.2 fL (ref 80.0–100.0)
Monocytes Absolute: 0.4 10*3/uL (ref 0.1–1.0)
Monocytes Relative: 5 %
Neutro Abs: 5.7 10*3/uL (ref 1.7–7.7)
Neutrophils Relative %: 63 %
Platelets: 212 10*3/uL (ref 150–400)
RBC: 4.48 MIL/uL (ref 3.87–5.11)
RDW: 13.8 % (ref 11.5–15.5)
WBC: 9 10*3/uL (ref 4.0–10.5)
nRBC: 0 % (ref 0.0–0.2)

## 2022-09-02 LAB — URINALYSIS, ROUTINE W REFLEX MICROSCOPIC
Bilirubin Urine: NEGATIVE
Glucose, UA: NEGATIVE mg/dL
Hgb urine dipstick: NEGATIVE
Ketones, ur: NEGATIVE mg/dL
Nitrite: NEGATIVE
Specific Gravity, Urine: 1.026 (ref 1.005–1.030)
pH: 5 (ref 5.0–8.0)

## 2022-09-02 LAB — LIPASE, BLOOD: Lipase: 14 U/L (ref 11–51)

## 2022-09-02 MED ORDER — METHYLPREDNISOLONE SODIUM SUCC 125 MG IJ SOLR
125.0000 mg | Freq: Once | INTRAMUSCULAR | Status: AC
  Administered 2022-09-02: 125 mg via INTRAVENOUS
  Filled 2022-09-02: qty 2

## 2022-09-02 MED ORDER — SODIUM CHLORIDE 0.9 % IV BOLUS
1000.0000 mL | Freq: Once | INTRAVENOUS | Status: AC
  Administered 2022-09-02: 1000 mL via INTRAVENOUS

## 2022-09-02 MED ORDER — ONDANSETRON 4 MG PO TBDP
4.0000 mg | ORAL_TABLET | Freq: Three times a day (TID) | ORAL | 0 refills | Status: DC | PRN
  Filled 2022-09-02: qty 10, 4d supply, fill #0

## 2022-09-02 MED ORDER — PREDNISONE 20 MG PO TABS
ORAL_TABLET | ORAL | 0 refills | Status: AC
  Filled 2022-09-02: qty 20, 12d supply, fill #0

## 2022-09-02 NOTE — ED Triage Notes (Signed)
Pt recently stopped taking tamoxafin aug 15, and was told to restart it a week or 2 later.

## 2022-09-02 NOTE — Discharge Instructions (Addendum)
Please read and follow all provided instructions.  Your diagnoses today include:  1. Pain of left hip   2. Nausea and vomiting, unspecified vomiting type     Tests performed today include: Blood cell counts and platelets: Were normal Kidney and liver function tests: No problems, glucose was just slightly high at 114 Pancreas function test (called lipase) Urine test to look for infection: No signs of infection Vital signs. See below for your results today.   Medications prescribed:  Prednisone - steroid medicine   It is best to take this medication in the morning to prevent sleeping problems. If you are diabetic, monitor your blood sugar closely and stop taking Prednisone if blood sugar is over 300. Take with food to prevent stomach upset.   Zofran (ondansetron) - for nausea and vomiting  Take any prescribed medications only as directed.  Home care instructions:  Follow any educational materials contained in this packet. You could potentially benefit from physical therapy of your spine or by doing some stretching to help relieve pressure on the lower back nerves.   Follow-up instructions: Please follow-up with your primary care provider in the next 7 days for further evaluation of your symptoms.    Return instructions:  SEEK IMMEDIATE MEDICAL ATTENTION IF: The pain does not go away or becomes severe  A temperature above 101F develops  Repeated vomiting occurs (multiple episodes)  The pain becomes localized to portions of the abdomen. The right side could possibly be appendicitis. In an adult, the left lower portion of the abdomen could be colitis or diverticulitis.  Blood is being passed in stools or vomit (bright red or black tarry stools)  You develop chest pain, difficulty breathing, dizziness or fainting, or become confused, poorly responsive, or inconsolable (young children) If you have any other emergent concerns regarding your health  Your vital signs today were: BP (!)  147/80 (BP Location: Right Arm)   Pulse 71   Temp 98 F (36.7 C) (Oral)   Resp 18   LMP 11/08/2013   SpO2 100%  If your blood pressure (bp) was elevated above 135/85 this visit, please have this repeated by your doctor within one month. --------------

## 2022-09-02 NOTE — Discharge Instructions (Addendum)
Please proceed to the emergency room for higher level of care and evaluation then we can provide in the urgent care setting

## 2022-09-02 NOTE — ED Provider Notes (Signed)
[facility] EMERGENCY DEPT Provider Note   CSN: [id number] Arrival date & time: 09/02/22  0488     History  No chief complaint on file.   Veronica Rogers is a 61 y.o. female.  Patient with no past surgical history presents the emergency department for complaint of left hip and flank pain and burning.  She also reports nausea with occasional episodes of vomiting, for instance yesterday vomited 3 times.  Also reports dull frontal headache for 1 week.  Nausea has been ongoing for about a week.  The hip pain has been ongoing for about 2 and half weeks.  Patient did have a fall onto her face prior to the hip pain beginning.  Headache did not start until at least a week after the fall.  Patient is not anticoagulated.  No associated neck pain, weakness, numbness, or tingling in the arms of the legs.  Home meds have not really been helping.  Saw outside provider today, referred to the emergency department.  No fevers reported no chest pain or shortness of breath.  No dysuria, hematuria, increased frequency or urgency.  Patient initially thought that she might be getting shingles in the hip, but has not developed any rashes.       Home Medications Prior to Admission medications   Medication Sig Start Date End Date Taking? Authorizing Provider  albuterol (VENTOLIN HFA) 108 (90 Base) MCG/ACT inhaler Inhale 1-2 puffs into the lungs every 6 (six) hours as needed for wheezing or shortness of breath. Patient not taking: Reported on 08/13/2022 01/21/22   Libby Maw, MD  amoxicillin (AMOXIL) 500 MG capsule Take 4 capsules (2,000 mg total) by mouth as directed 1 hour prior to dental appointment Patient not taking: Reported on 08/13/2022 02/15/22   Libby Maw, MD  atorvastatin (LIPITOR) 10 MG tablet Take 1 tablet (10 mg total) by mouth daily. 07/23/22   Libby Maw, MD  celecoxib (CELEBREX) 200 MG capsule Take 1 capsule (200 mg total) by mouth 2 (two) times  daily. 09/12/21   Irving Copas, PA-C  celecoxib (CELEBREX) 200 MG capsule Take 1 capsule (200 mg total) by mouth daily. For 2 weeks and stop. 02/15/22   Libby Maw, MD  ferrous sulfate 325 (65 FE) MG EC tablet Take 325 mg by mouth 3 (three) times daily with meals.    [provider]  glipiZIDE (GLUCOTROL) 5 MG tablet Take 1-2 tablets (5-10 mg total) by mouth daily with an evening meal 10/26/21   Philemon Kingdom, MD  glucose blood (FREESTYLE LITE) test strip Use as directed twice daily for glucose testing 07/23/22   Philemon Kingdom, MD  glucose monitoring kit (FREESTYLE) monitoring kit 1 each by Does not apply route as needed for other. UAD for bid blood glucose testing Dx: E11.65 01/11/20   Lucille Passy, MD  hydrocortisone-pramoxine [facility]) 2.5-1 % rectal cream Apply up to twice daily Patient not taking: Reported on 08/13/2022 09/12/21     Lancets (FREESTYLE) lancets UAD for bid blood glucose testing Dx: E11.65 01/11/20   Lucille Passy, MD  Lidocaine-Hydrocort, Perianal, 3-0.5 % CREA Apply as needed up to twice daily. Patient not taking: Reported on 08/13/2022 09/12/21     metFORMIN (GLUCOPHAGE-XR) 750 MG 24 hr tablet Take 2 tablets (1,500 mg total) by mouth at bedtime. 10/26/21   Philemon Kingdom, MD  oxybutynin (DITROPAN-XL) 10 MG 24 hr tablet Take 1 tablet (10 mg total) by mouth daily in the mid morning. 01/28/22  Semaglutide (RYBELSUS) 14 MG TABS Take 1 tablet (14 mg total) by mouth daily. 08/01/22   Philemon Kingdom, MD  sertraline (ZOLOFT) 50 MG tablet Take 1 tablet (50 mg total) by mouth daily. 07/08/22     tamoxifen (NOLVADEX) 20 MG tablet Take 1 tablet by mouth daily. 10/02/21 10/27/22  Nicholas Lose, MD  vitamin B-12 (CYANOCOBALAMIN) 1000 MCG tablet Take 1 tablet (1,000 mcg total) by mouth daily. 07/01/22   Libby Maw, MD      Allergies    Erythromycin, B-complex-b-12 [b complex vitamins], and Tape    Review of Systems   Review of  Systems  Physical Exam Updated Vital Signs BP 102/83 (BP Location: Right Arm)   Pulse 90   Temp 98.2 F (36.8 C)   Resp 14   LMP 11/08/2013   SpO2 97%   Physical Exam Vitals and nursing note reviewed.  Constitutional:      General: She is not in acute distress.    Appearance: She is well-developed.  HENT:     Head: Normocephalic and atraumatic.     Right Ear: External ear normal.     Left Ear: External ear normal.     Nose: Nose normal.  Eyes:     Conjunctiva/sclera: Conjunctivae normal.  Cardiovascular:     Rate and Rhythm: Normal rate and regular rhythm.     Heart sounds: No murmur heard. Pulmonary:     Effort: No respiratory distress.     Breath sounds: No wheezing, rhonchi or rales.  Abdominal:     Palpations: Abdomen is soft.     Tenderness: There is abdominal tenderness. There is no guarding or rebound.     Comments: Mild tenderness to palpation, left lower quadrant.  Musculoskeletal:     Cervical back: Normal range of motion and neck supple.     Right lower leg: No edema.     Left lower leg: No edema.     Comments: Normal range of motion of the left hip.  I evaluated the skin in the area of pain.  No signs of cellulitis, abscess.  No rashes or ecchymosis.  Skin:    General: Skin is warm and dry.     Findings: No rash.  Neurological:     General: No focal deficit present.     Mental Status: She is alert. Mental status is at baseline.     Motor: No weakness.  Psychiatric:        Mood and Affect: Mood normal.     ED Results / Procedures / Treatments   Labs (all labs ordered are listed, but only abnormal results are displayed) Labs Reviewed  COMPREHENSIVE METABOLIC PANEL - Abnormal; Notable for the following components:      Result Value   Glucose, Bld 114 (*)    AST 13 (*)    Alkaline Phosphatase 32 (*)    All other components within normal limits  URINALYSIS, ROUTINE W REFLEX MICROSCOPIC - Abnormal; Notable for the following components:   Protein, ur  TRACE (*)    Leukocytes,Ua TRACE (*)    All other components within normal limits  CBC WITH DIFFERENTIAL/PLATELET  LIPASE, BLOOD    EKG None  Radiology CT L-SPINE NO CHARGE  Result Date: 09/02/2022 CLINICAL DATA:  61 year old female with left flank and hip pain. Breast cancer. EXAM: CT LUMBAR SPINE WITHOUT CONTRAST TECHNIQUE: Technique: Multiplanar CT images of the lumbar spine were reconstructed from contemporary CT of the Abdomen and Pelvis. RADIATION DOSE REDUCTION: This exam  was performed according to the departmental dose-optimization program which includes automated exposure control, adjustment of the mA and/or kV according to patient size and/or use of iterative reconstruction technique. CONTRAST:  None. COMPARISON:  CT Abdomen and Pelvis today are reported separately. Lumbar radiographs 11/14/2018. FINDINGS: Segmentation: Normal. Alignment: Stable lumbar lordosis since 2019. Mild chronic levoconvex lumbar scoliosis. Vertebrae: No acute or suspicious osseous lesion identified. Visible sacrum and SI joints appear intact. Probable mild congenital wedging of T11 and T12 appear stable since the 2019 radiographs. Paraspinal and other soft tissues: Abdominal and pelvic viscera are reported separately today. Lumbar paraspinal soft tissues are within normal limits. Disc levels: Visible lower thoracic levels through L1-L2 appear normal for age. L2-L3: Disc space loss and vacuum disc. Right eccentric circumferential disc bulging and endplate spurring. No significant spinal stenosis. Up to mild L2 foraminal stenosis. L3-L4: Disc space loss and vacuum disc. Circumferential disc bulge. No significant stenosis. L4-L5: Disc space loss and vacuum disc. Circumferential disc bulging and endplate spurring eccentric to the left. Mild facet hypertrophy. No convincing spinal stenosis. Mild left L4 foraminal stenosis. L5-S1: Disc space loss and vacuum disc. Circumferential disc bulge. Mild facet hypertrophy. Mild to  moderate right L5 foraminal stenosis. IMPRESSION: 1. No acute osseous abnormality in the lumbar spine. 2. Lumbar spine degeneration L2-L3 through L5-S1 with vacuum disc but no convincing spinal stenosis. Up to moderate neural foraminal stenosis suspected at the right L5 nerve level. Mild foraminal stenosis otherwise. 3. CT Abdomen and Pelvis today reported separately. Electronically Signed   By: Genevie Ann M.D.   On: 09/02/2022 11:34   CT Renal Stone Study  Result Date: 09/02/2022 CLINICAL DATA:  Left flank pain. EXAM: CT ABDOMEN AND PELVIS WITHOUT CONTRAST TECHNIQUE: Multidetector CT imaging of the abdomen and pelvis was performed following the standard protocol without IV contrast. RADIATION DOSE REDUCTION: This exam was performed according to the departmental dose-optimization program which includes automated exposure control, adjustment of the mA and/or kV according to patient size and/or use of iterative reconstruction technique. COMPARISON:  None Available. FINDINGS: Lower chest: No acute findings. Hepatobiliary: Several fluid attenuation cysts are noted in both the right and left lobes. No mass visualized on this unenhanced exam. Gallbladder is unremarkable. No evidence of biliary ductal dilatation. Pancreas: No mass or inflammatory process visualized on this unenhanced exam. Spleen:  Within normal limits in size. Adrenals/Urinary tract: No evidence of urolithiasis or hydronephrosis. A few left renal cysts are noted (no followup imaging is recommended). Unremarkable unopacified urinary bladder. Stomach/Bowel: No evidence of obstruction, inflammatory process, or abnormal fluid collections. Normal appendix visualized. Vascular/Lymphatic: No pathologically enlarged lymph nodes identified. No evidence of abdominal aortic aneurysm. Aortic atherosclerotic calcification incidentally noted. Reproductive:  No mass or other significant abnormality. Other:  None. Musculoskeletal:  No suspicious bone lesions identified.  IMPRESSION: No evidence of urolithiasis, hydronephrosis, or other acute findings. Electronically Signed   By: Marlaine Hind M.D.   On: 09/02/2022 11:29    Procedures Procedures    Medications Ordered in ED Medications  sodium chloride 0.9 % bolus 1,000 mL ( Intravenous Stopped 09/02/22 1253)  methylPREDNISolone sodium succinate (SOLU-MEDROL) 125 mg/2 mL injection 125 mg (125 mg Intravenous Given 09/02/22 1301)    ED Course/ Medical Decision Making/ A&P    Patient seen and examined. History obtained directly from patient.  Also reviewed external urgent care note.  Labs/EKG: Ordered CBC, CMP, lipase, UA  Imaging: Ordered CT abdomen pelvis, renal protocol, lumbar spine  Medications/Fluids: Ordered: IV fluid bolus  Most recent vital signs reviewed and are as follows: BP 102/83 (BP Location: Right Arm)   Pulse 90   Temp 98.2 F (36.8 C)   Resp 14   LMP 11/08/2013   SpO2 97%   Initial impression: Nausea, hip/flank pain.  Also headache.  Low concern for intracranial bleeding at this time.  Headache did not correlate with recent fall and low concern that this is associated.    Reassessment performed. Patient appears comfortable.  States that she is feeling better after IV fluids.  Labs personally reviewed and interpreted including: CBC with differential unremarkable; CMP unremarkable; lipase normal; UA without compelling signs of infection.  Imaging personally visualized and interpreted including: CT renal protocol abdomen pelvis, L-spine, agree no acute findings, does show some disc bulging in the lumbar spine.  Reviewed pertinent lab work and imaging with patient at bedside. Questions answered.   Patient is feeling better and comfortable discharged home.  We discussed that her hip symptoms may be related to some nerve compression in her back related to her bulging disks.  We will give trial of prednisone and Zofran for nausea.  Unclear etiology of nausea, but abdominal labs are  reassuring today.  Most current vital signs reviewed and are as follows: BP (!) 147/80 (BP Location: Right Arm)   Pulse 71   Temp 98 F (36.7 C) (Oral)   Resp 18   LMP 11/08/2013   SpO2 100%   Plan: Discharge to home  Prescriptions written for: Prednisone taper, Zofran  Other home care instructions discussed: Bland diet, avoidance of triggers  ED return instructions discussed: The patient was urged to return to the Emergency Department immediately with worsening of current symptoms, worsening abdominal pain, persistent vomiting, blood noted in stools, fever, or any other concerns. The patient verbalized understanding.   Follow-up instructions discussed: Patient encouraged to follow-up with their PCP in 5 days.                            Medical Decision Making Amount and/or Complexity of Data Reviewed Labs: ordered. Radiology: ordered.  Risk Prescription drug management.   For this patient's complaint of nausea and vomiting, the following conditions were considered on the differential diagnosis: gastritis/PUD, enteritis/duodenitis, appendicitis, cholelithiasis/cholecystitis, cholangitis, pancreatitis, ruptured viscus, colitis, diverticulitis, small/large bowel obstruction, proctitis, cystitis, pyelonephritis, ureteral colic, aortic dissection, aortic aneurysm. In women, ectopic pregnancy, pelvic inflammatory disease, ovarian cysts, and tubo-ovarian abscess were also considered. Atypical chest etiologies were also considered including ACS, PE, and pneumonia.  In regards to her hip pain, no sign of ureteral colic or diverticulitis on work-up today.  May be radicular in nature given the fact that she describes it as a burning kind of sensation.  No weakness in the leg and no concern for central cord problem or more significant problems such as spinal abscess, cauda equina syndrome, osteomyelitis, discitis.  We will give trial of steroids as above and outpatient follow-up.  The  patient's vital signs, pertinent lab work and imaging were reviewed and interpreted as discussed in the ED course. Hospitalization was considered for further testing, treatments, or serial exams/observation. However as patient is well-appearing, has a stable exam, and reassuring studies today, I do not feel that they warrant admission at this time. This plan was discussed with the patient who verbalizes agreement and comfort with this plan and seems reliable and able to return to the Emergency Department with worsening or changing symptoms.  Final Clinical Impression(s) / ED Diagnoses Final diagnoses:  Pain of left hip  Nausea and vomiting, unspecified vomiting type    Rx / DC Orders ED Discharge Orders          Ordered    predniSONE (DELTASONE) 20 MG tablet        09/02/22 1244    ondansetron (ZOFRAN-ODT) 4 MG disintegrating tablet  Every 8 hours PRN        09/02/22 1244              Carlisle Cater, PA-C 09/02/22 1508    Gareth Morgan, MD 09/04/22 [phone]

## 2022-09-02 NOTE — ED Triage Notes (Addendum)
Pt reports mild nausea with occ vomiting and left hip pain for 1 week.  Worse at night.  Pt reports generalized weakness.

## 2022-09-02 NOTE — ED Triage Notes (Signed)
Reports hx Ca 10 yrs ago - stopped taking tamoxifin on her own 8/15 (has f/u with oncologist in Oct) - started with nausea 8/28; states resumed tamoxifen approx 5 days ago. C/o nausea x 7 days; had 3 episodes vomiting yesterday and 1 episode 2 days ago. Slight HA intermittently onset 1 wk ago. C/O left hip burning radiating into left groin area onset couple weeks ago with worsening over past wk. Note: Had fall on cement 3 wks ago - had to go to an urgent care for face.

## 2022-09-02 NOTE — ED Provider Notes (Signed)
[facility]    CSN: [id number] Arrival date & time: 09/02/22  4098      History   Chief Complaint Chief Complaint  Patient presents with   Nausea   Hip Pain    HPI Veronica Rogers is a 61 y.o. female.    Hip Pain   Here for nausea that is been going on for a week.  Yesterday and the day before she has had 2-3 episodes of vomiting.  No abdominal pain.  No diarrhea, but she did have a normal bowel movement this morning.  No fever or chills.  She does have a dull mild headache that is been going on for about a week.  She also has had for a longer time some burning left low back pain onto her left lateral hip.  On August 18 she was seen in outside urgent care for a fall onto cement were she had to have stitches on her face.  She has been on tamoxifen for approximately 8 or 9 years, and had come off it on her own 2 or 3 weeks ago.  She then restarted it because she felt like it was may be causing her nausea  Past Medical History:  Diagnosis Date   Arthritis    generalixed   Breast cancer ([place])    Contact lens/glasses fitting    wears contacts or glasses   Diabetes ([place]) 11/22/2014   type 2- on meds   GERD (gastroesophageal reflux disease)    hx of   Hyperlipidemia    Personal history of chemotherapy    Personal history of radiation therapy    Radiation 03/28/14-05/06/14   Left Breast 60 Gy    Patient Active Problem List   Diagnosis Date Noted   Microcytic anemia 06/28/2022   Atypical nevus 06/28/2022   Arthralgia of right temporomandibular joint 02/15/2022   Need for SBE (subacute bacterial endocarditis) prophylaxis 02/15/2022   S/P total knee arthroplasty, right 09/11/2021   Pre-op evaluation 07/17/2021   Wound of abdomen 05/04/2021   Carpal tunnel syndrome of left wrist 10/13/2020   Chronic pain of right knee 10/13/2020   Colon cancer screening 09/13/2019   OA (osteoarthritis) of knee 11/11/2018   Hyperlipidemia 08/26/2017   Type 2 diabetes  mellitus with hyperglycemia, without long-term current use of insulin ([place]) 01/08/2016   Allergic rhinitis 11/22/2014   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[place]) 10/14/2013    Past Surgical History:  Procedure Laterality Date   BREAST BIOPSY Right 12/30/1994   breast, benign   BREAST LUMPECTOMY WITH NEEDLE LOCALIZATION AND AXILLARY SENTINEL LYMPH NODE BX Left 10/29/2013   Procedure: BREAST LUMPECTOMY WITH NEEDLE LOCALIZATION AND AXILLARY SENTINEL LYMPH NODE BX;  Surgeon: Rolm Bookbinder, MD;  Location: [facility];  Service: General;  Laterality: Left;   COLONOSCOPY  05/31/2011   DJ-F/V movi(good)-F/V-normal   TOTAL KNEE ARTHROPLASTY Right 09/11/2021   Procedure: TOTAL KNEE ARTHROPLASTY;  Surgeon: Paralee Cancel, MD;  Location: [facility];  Service: Orthopedics;  Laterality: Right;   WISDOM TOOTH EXTRACTION      OB History   No obstetric history on file.      Home Medications    Prior to Admission medications   Medication Sig Start Date End Date Taking? Authorizing Provider  atorvastatin (LIPITOR) 10 MG tablet Take 1 tablet (10 mg total) by mouth daily. 07/23/22  Yes Libby Maw, MD  glipiZIDE (GLUCOTROL) 5 MG tablet Take 1-2 tablets (5-10 mg total) by mouth  daily with an evening meal 10/26/21  Yes Philemon Kingdom, MD  metFORMIN (GLUCOPHAGE-XR) 750 MG 24 hr tablet Take 2 tablets (1,500 mg total) by mouth at bedtime. 10/26/21  Yes Philemon Kingdom, MD  oxybutynin (DITROPAN-XL) 10 MG 24 hr tablet Take 1 tablet (10 mg total) by mouth daily in the mid morning. 01/28/22  Yes   Semaglutide (RYBELSUS) 14 MG TABS Take 1 tablet (14 mg total) by mouth daily. 08/01/22  Yes Philemon Kingdom, MD  sertraline (ZOLOFT) 50 MG tablet Take 1 tablet (50 mg total) by mouth daily. 07/08/22  Yes   tamoxifen (NOLVADEX) 20 MG tablet Take 1 tablet by mouth daily. 10/02/21 10/27/22 Yes Nicholas Lose, MD  vitamin B-12 (CYANOCOBALAMIN) 1000 MCG tablet Take 1 tablet  (1,000 mcg total) by mouth daily. 07/01/22  Yes Libby Maw, MD  albuterol (VENTOLIN HFA) 108 (90 Base) MCG/ACT inhaler Inhale 1-2 puffs into the lungs every 6 (six) hours as needed for wheezing or shortness of breath. Patient not taking: Reported on 08/13/2022 01/21/22   Libby Maw, MD  amoxicillin (AMOXIL) 500 MG capsule Take 4 capsules (2,000 mg total) by mouth as directed 1 hour prior to dental appointment Patient not taking: Reported on 08/13/2022 02/15/22   Libby Maw, MD  celecoxib (CELEBREX) 200 MG capsule Take 1 capsule (200 mg total) by mouth 2 (two) times daily. 09/12/21   Irving Copas, PA-C  celecoxib (CELEBREX) 200 MG capsule Take 1 capsule (200 mg total) by mouth daily. For 2 weeks and stop. 02/15/22   Libby Maw, MD  ferrous sulfate 325 (65 FE) MG EC tablet Take 325 mg by mouth 3 (three) times daily with meals.    [provider]  glucose blood (FREESTYLE LITE) test strip Use as directed twice daily for glucose testing 07/23/22   Philemon Kingdom, MD  glucose monitoring kit (FREESTYLE) monitoring kit 1 each by Does not apply route as needed for other. UAD for bid blood glucose testing Dx: E11.65 01/11/20   Lucille Passy, MD  hydrocortisone-pramoxine [facility]) 2.5-1 % rectal cream Apply up to twice daily Patient not taking: Reported on 08/13/2022 09/12/21     Lancets (FREESTYLE) lancets UAD for bid blood glucose testing Dx: E11.65 01/11/20   Lucille Passy, MD  Lidocaine-Hydrocort, Perianal, 3-0.5 % CREA Apply as needed up to twice daily. Patient not taking: Reported on 08/13/2022 09/12/21       Family History Family History  Problem Relation Age of Onset   Hypertension Mother    Squamous cell carcinoma Father    Cancer Father    Diabetes Father    Diabetes Sister    Colon polyps Neg Hx    Colon cancer Neg Hx    Esophageal cancer Neg Hx    Stomach cancer Neg Hx    Rectal cancer Neg Hx     Social History Social History    Tobacco Use   Smoking status: Never   Smokeless tobacco: Never  Vaping Use   Vaping Use: Never used  Substance Use Topics   Alcohol use: Yes    Comment: rarely   Drug use: No     Allergies   Erythromycin, B-complex-b-12 [b complex vitamins], and Tape   Review of Systems Review of Systems   Physical Exam Triage Vital Signs ED Triage Vitals  Enc Vitals Group     BP 09/02/22 0911 123/84     Pulse Rate 09/02/22 0911 93     Resp 09/02/22 0911 16  Temp 09/02/22 0911 97.9 F (36.6 C)     Temp Source 09/02/22 0911 Oral     SpO2 09/02/22 0911 94 %     Weight --      Height --      Head Circumference --      Peak Flow --      Pain Score 09/02/22 0912 7     Pain Loc --      Pain Edu? --      Excl. in [place]? --    No data found.  Updated Vital Signs BP 123/84   Pulse 93   Temp 97.9 F (36.6 C) (Oral)   Resp 16   LMP 11/08/2013   SpO2 94%   Visual Acuity Right Eye Distance:   Left Eye Distance:   Bilateral Distance:    Right Eye Near:   Left Eye Near:    Bilateral Near:     Physical Exam   UC Treatments / Results  Labs (all labs ordered are listed, but only abnormal results are displayed) Labs Reviewed - No data to display  EKG   Radiology No results found.  Procedures Procedures (including critical care time)  Medications Ordered in UC Medications - No data to display  Initial Impression / Assessment and Plan / UC Course  I have reviewed the triage vital signs and the nursing notes.  Pertinent labs & imaging results that were available during my care of the patient were reviewed by me and considered in my medical decision making (see chart for details).     Gust with her that I do not think this is gastroenteritis since she has had such protracted symptoms.  She has restarted her medication about 5 or 6 days ago, and is still having the nausea.  I just really do not think this is from her stopping her tamoxifen.  Since she had a head  injury about 2 weeks ago, and now that she has nausea and vomiting and some mild headache, I do think she needs to be evaluated in the emergency room for higher level of care and evaluation then we can provide here. Final Clinical Impressions(s) / UC Diagnoses   Final diagnoses:  Nausea  Left hip pain     Discharge Instructions      Please proceed to the emergency room for higher level of care and evaluation then we can provide in the urgent care setting    ED Prescriptions   None    PDMP not reviewed this encounter.   Barrett Henle, MD 09/02/22 [phone]

## 2022-09-03 ENCOUNTER — Other Ambulatory Visit (HOSPITAL_COMMUNITY): Payer: Self-pay

## 2022-09-10 ENCOUNTER — Other Ambulatory Visit (HOSPITAL_COMMUNITY): Payer: Self-pay

## 2022-09-10 MED ORDER — INFLUENZA VAC SPLIT QUAD 0.5 ML IM SUSY
PREFILLED_SYRINGE | INTRAMUSCULAR | 0 refills | Status: DC
  Filled 2022-09-10: qty 0.5, 1d supply, fill #0

## 2022-09-12 ENCOUNTER — Ambulatory Visit (AMBULATORY_SURGERY_CENTER): Payer: No Typology Code available for payment source | Admitting: Gastroenterology

## 2022-09-12 ENCOUNTER — Encounter: Payer: Self-pay | Admitting: Gastroenterology

## 2022-09-12 VITALS — BP 152/67 | HR 71 | Temp 96.6°F | Resp 15 | Ht 67.0 in | Wt 189.0 lb

## 2022-09-12 DIAGNOSIS — Z1211 Encounter for screening for malignant neoplasm of colon: Secondary | ICD-10-CM | POA: Diagnosis present

## 2022-09-12 MED ORDER — SODIUM CHLORIDE 0.9 % IV SOLN: 500.0000 mL | Freq: Once | INTRAVENOUS | Status: DC

## 2022-09-12 NOTE — Patient Instructions (Signed)
YOU HAD AN ENDOSCOPIC PROCEDURE TODAY AT THE [facility]:   Refer to the procedure report that was given to you for any specific questions about what was found during the examination.  If the procedure report does not answer your questions, please call your gastroenterologist to clarify.  If you requested that your care partner not be given the details of your procedure findings, then the procedure report has been included in a sealed envelope for you to review at your convenience later.  YOU SHOULD EXPECT: Some feelings of bloating in the abdomen. Passage of more gas than usual.  Walking can help get rid of the air that was put into your GI tract during the procedure and reduce the bloating. If you had a lower endoscopy (such as a colonoscopy or flexible sigmoidoscopy) you may notice spotting of blood in your stool or on the toilet paper. If you underwent a bowel prep for your procedure, you may not have a normal bowel movement for a few days.  Please Note:  You might notice some irritation and congestion in your nose or some drainage.  This is from the oxygen used during your procedure.  There is no need for concern and it should clear up in a day or so.  SYMPTOMS TO REPORT IMMEDIATELY:  Following lower endoscopy (colonoscopy or flexible sigmoidoscopy):  Excessive amounts of blood in the stool  Significant tenderness or worsening of abdominal pains  Swelling of the abdomen that is new, acute  Fever of 100F or higher  For urgent or emergent issues, a gastroenterologist can be reached at any hour by calling [phone]. Do not use MyChart messaging for urgent concerns.    DIET:  We do recommend a small meal at first, but then you may proceed to your regular diet.  Drink plenty of fluids but you should avoid alcoholic beverages for 24 hours.  ACTIVITY:  You should plan to take it easy for the rest of today and you should NOT DRIVE or use heavy machinery until tomorrow (because of  the sedation medicines used during the test).    FOLLOW UP: Our staff will call the number listed on your records the next business day following your procedure.  We will call around 7:15- 8:00 am to check on you and address any questions or concerns that you may have regarding the information given to you following your procedure. If we do not reach you, we will leave a message.     If any biopsies were taken you will be contacted by phone or by letter within the next 1-3 weeks.  Please call us at [phone] if you have not heard about the biopsies in 3 weeks.    SIGNATURES/CONFIDENTIALITY: You and/or your care partner have signed paperwork which will be entered into your electronic medical record.  These signatures attest to the fact that that the information above on your After Visit Summary has been reviewed and is understood.  Full responsibility of the confidentiality of this discharge information lies with you and/or your care-partner.  

## 2022-09-12 NOTE — Progress Notes (Signed)
[facility] History and Physical   Primary Care Physician:  Libby Maw, MD   Reason for Procedure:   Colon cancer screening  Plan:    Screening colonoscopy     HPI: Veronica Rogers is a 61 y.o. female undergoing  average risk screening colonoscopy.  She has no family history of colon cancer and no chronic GI symptoms. She had a normal colonoscopy with Dr. Ardis Hughs in 2012.   Past Medical History:  Diagnosis Date   Arthritis    generalixed   Breast cancer ([place])    Contact lens/glasses fitting    wears contacts or glasses   Diabetes ([place]) 11/22/2014   type 2- on meds   GERD (gastroesophageal reflux disease)    hx of   Hyperlipidemia    Personal history of chemotherapy    Personal history of radiation therapy    Radiation 03/28/14-05/06/14   Left Breast 60 Gy    Past Surgical History:  Procedure Laterality Date   BREAST BIOPSY Right 12/30/1994   breast, benign   BREAST LUMPECTOMY WITH NEEDLE LOCALIZATION AND AXILLARY SENTINEL LYMPH NODE BX Left 10/29/2013   Procedure: BREAST LUMPECTOMY WITH NEEDLE LOCALIZATION AND AXILLARY SENTINEL LYMPH NODE BX;  Surgeon: Rolm Bookbinder, MD;  Location: [facility];  Service: General;  Laterality: Left;   COLONOSCOPY  05/31/2011   DJ-F/V movi(good)-F/V-normal   TOTAL KNEE ARTHROPLASTY Right 09/11/2021   Procedure: TOTAL KNEE ARTHROPLASTY;  Surgeon: Paralee Cancel, MD;  Location: [facility];  Service: Orthopedics;  Laterality: Right;   WISDOM TOOTH EXTRACTION      Prior to Admission medications   Medication Sig Start Date End Date Taking? Authorizing Provider  atorvastatin (LIPITOR) 10 MG tablet Take 1 tablet (10 mg total) by mouth daily. 07/23/22  Yes Libby Maw, MD  glipiZIDE (GLUCOTROL) 5 MG tablet Take 1-2 tablets (5-10 mg total) by mouth daily with an evening meal 10/26/21  Yes Philemon Kingdom, MD  glucose blood (FREESTYLE LITE) test strip Use as directed twice daily for glucose testing  07/23/22  Yes Philemon Kingdom, MD  glucose monitoring kit (FREESTYLE) monitoring kit 1 each by Does not apply route as needed for other. UAD for bid blood glucose testing Dx: E11.65 01/11/20  Yes Lucille Passy, MD  influenza vac split quadrivalent PF (FLUARIX) 0.5 ML injection Inject into the muscle. 09/10/22  Yes Carlyle Basques, MD  Lancets (FREESTYLE) lancets UAD for bid blood glucose testing Dx: E11.65 01/11/20  Yes Lucille Passy, MD  metFORMIN (GLUCOPHAGE-XR) 750 MG 24 hr tablet Take 2 tablets (1,500 mg total) by mouth at bedtime. 10/26/21  Yes Philemon Kingdom, MD  oxybutynin (DITROPAN-XL) 10 MG 24 hr tablet Take 1 tablet (10 mg total) by mouth daily in the mid morning. 01/28/22  Yes   predniSONE (DELTASONE) 20 MG tablet Take 3 tablets (60 mg total) by mouth daily for 3 days, THEN 2 tablets (40 mg total) daily for 3 days, THEN 1 tablet (20 mg total) daily for 3 days, THEN 0.5 tablets (10 mg total) daily for 3 days. 09/02/22 09/15/22 Yes Carlisle Cater, PA-C  Semaglutide (RYBELSUS) 14 MG TABS Take 1 tablet (14 mg total) by mouth daily. 08/01/22  Yes Philemon Kingdom, MD  sertraline (ZOLOFT) 50 MG tablet Take 1 tablet (50 mg total) by mouth daily. 07/08/22  Yes   tamoxifen (NOLVADEX) 20 MG tablet Take 1 tablet by mouth daily. 10/02/21 10/27/22 Yes Nicholas Lose, MD  vitamin B-12 (CYANOCOBALAMIN) 1000 MCG tablet Take 1 tablet (1,000  mcg total) by mouth daily. 07/01/22  Yes Libby Maw, MD  albuterol (VENTOLIN HFA) 108 (90 Base) MCG/ACT inhaler Inhale 1-2 puffs into the lungs every 6 (six) hours as needed for wheezing or shortness of breath. Patient not taking: Reported on 08/13/2022 01/21/22   Libby Maw, MD  amoxicillin (AMOXIL) 500 MG capsule Take 4 capsules (2,000 mg total) by mouth as directed 1 hour prior to dental appointment Patient not taking: Reported on 08/13/2022 02/15/22   Libby Maw, MD  celecoxib (CELEBREX) 200 MG capsule Take 1 capsule (200 mg total) by mouth 2  (two) times daily. 09/12/21   Irving Copas, PA-C  celecoxib (CELEBREX) 200 MG capsule Take 1 capsule (200 mg total) by mouth daily. For 2 weeks and stop. 02/15/22   Libby Maw, MD  ferrous sulfate 325 (65 FE) MG EC tablet Take 325 mg by mouth 3 (three) times daily with meals.    [provider]  hydrocortisone-pramoxine [facility]) 2.5-1 % rectal cream Apply up to twice daily Patient not taking: Reported on 08/13/2022 09/12/21     Lidocaine-Hydrocort, Perianal, 3-0.5 % CREA Apply as needed up to twice daily. Patient not taking: Reported on 08/13/2022 09/12/21     ondansetron (ZOFRAN-ODT) 4 MG disintegrating tablet Take 1 tablet (4 mg total) by mouth every 8 (eight) hours as needed for nausea or vomiting. 09/02/22   Carlisle Cater, PA-C    Current Outpatient Medications  Medication Sig Dispense Refill   atorvastatin (LIPITOR) 10 MG tablet Take 1 tablet (10 mg total) by mouth daily. 90 tablet 0   glipiZIDE (GLUCOTROL) 5 MG tablet Take 1-2 tablets (5-10 mg total) by mouth daily with an evening meal 180 tablet 3   glucose blood (FREESTYLE LITE) test strip Use as directed twice daily for glucose testing 100 each 12   glucose monitoring kit (FREESTYLE) monitoring kit 1 each by Does not apply route as needed for other. UAD for bid blood glucose testing Dx: E11.65 1 each 1   influenza vac split quadrivalent PF (FLUARIX) 0.5 ML injection Inject into the muscle. 0.5 mL 0   Lancets (FREESTYLE) lancets UAD for bid blood glucose testing Dx: E11.65 100 each 12   metFORMIN (GLUCOPHAGE-XR) 750 MG 24 hr tablet Take 2 tablets (1,500 mg total) by mouth at bedtime. 180 tablet 3   oxybutynin (DITROPAN-XL) 10 MG 24 hr tablet Take 1 tablet (10 mg total) by mouth daily in the mid morning. 90 tablet 4   predniSONE (DELTASONE) 20 MG tablet Take 3 tablets (60 mg total) by mouth daily for 3 days, THEN 2 tablets (40 mg total) daily for 3 days, THEN 1 tablet (20 mg total) daily for 3 days, THEN 0.5 tablets  (10 mg total) daily for 3 days. 20 tablet 0   Semaglutide (RYBELSUS) 14 MG TABS Take 1 tablet (14 mg total) by mouth daily. 90 tablet 3   sertraline (ZOLOFT) 50 MG tablet Take 1 tablet (50 mg total) by mouth daily. 90 tablet 3   tamoxifen (NOLVADEX) 20 MG tablet Take 1 tablet by mouth daily. 90 tablet 3   vitamin B-12 (CYANOCOBALAMIN) 1000 MCG tablet Take 1 tablet (1,000 mcg total) by mouth daily. 90 tablet 1   albuterol (VENTOLIN HFA) 108 (90 Base) MCG/ACT inhaler Inhale 1-2 puffs into the lungs every 6 (six) hours as needed for wheezing or shortness of breath. (Patient not taking: Reported on 08/13/2022) 18 g 1   amoxicillin (AMOXIL) 500 MG capsule Take 4 capsules (2,000  mg total) by mouth as directed 1 hour prior to dental appointment (Patient not taking: Reported on 08/13/2022) 12 capsule 0   celecoxib (CELEBREX) 200 MG capsule Take 1 capsule (200 mg total) by mouth 2 (two) times daily. 60 capsule 0   celecoxib (CELEBREX) 200 MG capsule Take 1 capsule (200 mg total) by mouth daily. For 2 weeks and stop. 30 capsule 1   ferrous sulfate 325 (65 FE) MG EC tablet Take 325 mg by mouth 3 (three) times daily with meals.     hydrocortisone-pramoxine (ANALPRAM-HC) 2.5-1 % rectal cream Apply up to twice daily (Patient not taking: Reported on 08/13/2022) 30 g 3   Lidocaine-Hydrocort, Perianal, 3-0.5 % CREA Apply as needed up to twice daily. (Patient not taking: Reported on 08/13/2022) 98 g 3   ondansetron (ZOFRAN-ODT) 4 MG disintegrating tablet Take 1 tablet (4 mg total) by mouth every 8 (eight) hours as needed for nausea or vomiting. 10 tablet 0   Current Facility-Administered Medications  Medication Dose Route Frequency Provider Last Rate Last Admin   0.9 %  sodium chloride infusion  500 mL Intravenous Once Daryel November, MD        Allergies as of 09/12/2022 - Review Complete 09/12/2022  Allergen Reaction Noted   Erythromycin Other (See Comments) 04/18/2011   B-complex-b-12 [b complex vitamins]  Itching and Rash 09/14/2020   Tape Rash 12/03/2013    Family History  Problem Relation Age of Onset   Hypertension Mother    Squamous cell carcinoma Father    Cancer Father    Diabetes Father    Diabetes Sister    Colon polyps Neg Hx    Colon cancer Neg Hx    Esophageal cancer Neg Hx    Stomach cancer Neg Hx    Rectal cancer Neg Hx     Social History   Socioeconomic History   Marital status: Married    Spouse name: Not on file   Number of children: Not on file   Years of education: Not on file   Highest education level: Not on file  Occupational History   Not on file  Tobacco Use   Smoking status: Never   Smokeless tobacco: Never  Vaping Use   Vaping Use: Never used  Substance and Sexual Activity   Alcohol use: Yes    Comment: rarely   Drug use: No   Sexual activity: Not on file  Other Topics Concern   Not on file  Social History Narrative   Not on file   Social Determinants of Health   Financial Resource Strain: Not on file  Food Insecurity: Not on file  Transportation Needs: Not on file  Physical Activity: Not on file  Stress: Not on file  Social Connections: Not on file  Intimate Partner Violence: Not on file    Review of Systems:  All other review of systems negative except as mentioned in the HPI.  Physical Exam: Vital signs BP (!) 151/78   Pulse 78   Temp (!) 96.6 F (35.9 C) (Temporal)   Ht $R'5\' 7"'vg$  (1.702 m)   Wt 189 lb (85.7 kg)   LMP 11/08/2013   SpO2 98%   BMI 29.60 kg/m   General:   Alert,  Well-developed, well-nourished, pleasant and cooperative in NAD Airway:  Mallampati 3 Lungs:  Clear throughout to auscultation.   Heart:  Regular rate and rhythm; no murmurs, clicks, rubs,  or gallops. Abdomen:  Soft, nontender and nondistended. Normal bowel sounds.   Neuro/Psych:  Normal mood and affect. A and O x 3   Kiasia Chou E. Candis Schatz, MD [facility]

## 2022-09-12 NOTE — Progress Notes (Signed)
To pacu, VSS. Report to [email or web address].tb 

## 2022-09-12 NOTE — Op Note (Signed)
[facility] Patient Name: Veronica Rogers Procedure Date: 09/12/2022 8:35 AM MRN: [id number] Endoscopist: Nicki Reaper E. Candis Schatz , MD Age: 61 Referring MD:  Date of Birth: [birth year] Gender: Female Account #: [id number] Procedure:                Colonoscopy Indications:              Screening for colorectal malignant neoplasm (last                            colonoscopy was more than 10 years ago) Medicines:                Monitored Anesthesia Care Procedure:                Pre-Anesthesia Assessment:                           - Prior to the procedure, a History and Physical                            was performed, and patient medications and                            allergies were reviewed. The patient's tolerance of                            previous anesthesia was also reviewed. The risks                            and benefits of the procedure and the sedation                            options and risks were discussed with the patient.                            All questions were answered, and informed consent                            was obtained. Prior Anticoagulants: The patient has                            taken no previous anticoagulant or antiplatelet                            agents. ASA Grade Assessment: II - A patient with                            mild systemic disease. After reviewing the risks                            and benefits, the patient was deemed in                            satisfactory condition to undergo the procedure.  After obtaining informed consent, the colonoscope                            was passed under direct vision. Throughout the                            procedure, the patient's blood pressure, pulse, and                            oxygen saturations were monitored continuously. The                            CF [id number] #[id number] was introduced through the anus                            and advanced  to the the terminal ileum, with                            identification of the appendiceal orifice and IC                            valve. The colonoscopy was somewhat difficult due                            to significant looping and a tortuous colon.                            Successful completion of the procedure was aided by                            using manual pressure. The patient tolerated the                            procedure well. The quality of the bowel                            preparation was good. The terminal ileum, ileocecal                            valve, appendiceal orifice, and rectum were                            photographed. The bowel preparation used was SUPREP                            via split dose instruction. Scope In: 8:40:45 AM Scope Out: 8:58:31 AM Scope Withdrawal Time: 0 hours 8 minutes 6 seconds  Total Procedure Duration: 0 hours 17 minutes 46 seconds  Findings:                 The perianal and digital rectal examinations were                            normal. Pertinent negatives include normal  sphincter tone and no palpable rectal lesions.                           The colon (entire examined portion) appeared normal.                           The terminal ileum appeared normal.                           The retroflexed view of the distal rectum and anal                            verge was normal and showed no anal or rectal                            abnormalities. Complications:            No immediate complications. Estimated Blood Loss:     Estimated blood loss: none. Impression:               - The entire examined colon is normal.                           - The examined portion of the ileum was normal.                           - The distal rectum and anal verge are normal on                            retroflexion view.                           - No specimens collected. Recommendation:           -  Patient has a contact number available for                            emergencies. The signs and symptoms of potential                            delayed complications were discussed with the                            patient. Return to normal activities tomorrow.                            Written discharge instructions were provided to the                            patient.                           - Resume previous diet.                           - Continue present medications.                           -  Repeat colonoscopy in 10 years for screening                            purposes. Tijuana Scheidegger E. Candis Schatz, MD 09/12/2022 9:02:25 AM This report has been signed electronically.

## 2022-09-12 NOTE — Progress Notes (Signed)
VS completed by CW.   Pt's states no medical or surgical changes since previsit or office visit.  

## 2022-09-13 ENCOUNTER — Telehealth: Payer: Self-pay

## 2022-09-13 ENCOUNTER — Encounter: Payer: No Typology Code available for payment source | Admitting: Gastroenterology

## 2022-09-13 NOTE — Telephone Encounter (Signed)
Left message on follow up call. 

## 2022-09-20 ENCOUNTER — Other Ambulatory Visit (HOSPITAL_COMMUNITY): Payer: Self-pay

## 2022-09-24 ENCOUNTER — Encounter: Payer: Self-pay | Admitting: Hematology and Oncology

## 2022-09-29 NOTE — Progress Notes (Signed)
Patient Care Team: Libby Maw, MD as PCP - General (Family Medicine)  DIAGNOSIS: No diagnosis found.  SUMMARY OF ONCOLOGIC HISTORY: Oncology History  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[place])  10/29/2013 Surgery   left lumpectomy: 0.9 cm invasive ductal carcinoma, grade 2, ER/PR positive HER-2 negative, 1 sentinel node positive for micrometastatic, Oncotype DX recurrence score 29, 17% ROR   12/07/2013 - 02/08/2014 Chemotherapy   Taxotere and Cytoxan 4 adjuvant chemotherapy   03/30/2014 - 05/06/2014 Radiation Therapy   adjuvant radiation therapy   05/20/2014 -  Anti-estrogen oral therapy   tamoxifen 20 mg daily     CHIEF COMPLIANT:  Follow-up of left breast cancer on tamoxifen therapy    INTERVAL HISTORY: Veronica VILLAGOMEZ is a 61 y.o. with above-mentioned history of left breast cancer treated with lumpectomy, followed by adjuvant chemotherapy, and radiation, and who is currently on antiestrogen therapy with tamoxifen.    ALLERGIES:  is allergic to erythromycin, b-complex-b-12 [b complex vitamins], and tape.  MEDICATIONS:  Current Outpatient Medications  Medication Sig Dispense Refill   albuterol (VENTOLIN HFA) 108 (90 Base) MCG/ACT inhaler Inhale 1-2 puffs into the lungs every 6 (six) hours as needed for wheezing or shortness of breath. (Patient not taking: Reported on 08/13/2022) 18 g 1   atorvastatin (LIPITOR) 10 MG tablet Take 1 tablet (10 mg total) by mouth daily. 90 tablet 0   celecoxib (CELEBREX) 200 MG capsule Take 1 capsule (200 mg total) by mouth 2 (two) times daily. 60 capsule 0   celecoxib (CELEBREX) 200 MG capsule Take 1 capsule (200 mg total) by mouth daily. For 2 weeks and stop. 30 capsule 1   ferrous sulfate 325 (65 FE) MG EC tablet Take 325 mg by mouth 3 (three) times daily with meals.     glipiZIDE (GLUCOTROL) 5 MG tablet Take 1-2 tablets (5-10 mg total) by mouth daily with an evening meal 180 tablet 3   glucose blood (FREESTYLE  LITE) test strip Use as directed twice daily for glucose testing 100 each 12   glucose monitoring kit (FREESTYLE) monitoring kit 1 each by Does not apply route as needed for other. UAD for bid blood glucose testing Dx: E11.65 1 each 1   influenza vac split quadrivalent PF (FLUARIX) 0.5 ML injection Inject into the muscle. 0.5 mL 0   Lancets (FREESTYLE) lancets UAD for bid blood glucose testing Dx: E11.65 100 each 12   metFORMIN (GLUCOPHAGE-XR) 750 MG 24 hr tablet Take 2 tablets (1,500 mg total) by mouth at bedtime. 180 tablet 3   ondansetron (ZOFRAN-ODT) 4 MG disintegrating tablet Take 1 tablet (4 mg total) by mouth every 8 (eight) hours as needed for nausea or vomiting. 10 tablet 0   oxybutynin (DITROPAN-XL) 10 MG 24 hr tablet Take 1 tablet (10 mg total) by mouth daily in the mid morning. 90 tablet 4   Semaglutide (RYBELSUS) 14 MG TABS Take 1 tablet (14 mg total) by mouth daily. 90 tablet 3   sertraline (ZOLOFT) 50 MG tablet Take 1 tablet (50 mg total) by mouth daily. 90 tablet 3   tamoxifen (NOLVADEX) 20 MG tablet Take 1 tablet by mouth daily. 90 tablet 3   vitamin B-12 (CYANOCOBALAMIN) 1000 MCG tablet Take 1 tablet (1,000 mcg total) by mouth daily. 90 tablet 1   No current facility-administered medications for this visit.    PHYSICAL EXAMINATION: ECOG PERFORMANCE STATUS: {CHL ONC ECOG PS:[phone]}  There were no vitals filed for this visit. There were  no vitals filed for this visit.  BREAST:*** No palpable masses or nodules in either right or left breasts. No palpable axillary supraclavicular or infraclavicular adenopathy no breast tenderness or nipple discharge. (exam performed in the presence of a chaperone)  LABORATORY DATA:  I have reviewed the data as listed    Latest Ref Rng & Units 09/02/2022   11:35 AM 06/28/2022    1:35 PM 12/28/2021    2:18 PM  CMP  Glucose 70 - 99 mg/dL 114  99  89   BUN 8 - 23 mg/dL 16  16  16   Creatinine 0.44 - 1.00 mg/dL 0.68  0.66  0.67   Sodium  135 - 145 mmol/L 141  140  141   Potassium 3.5 - 5.1 mmol/L 3.6  4.3  4.0   Chloride 98 - 111 mmol/L 103  103  104   CO2 22 - 32 mmol/L 27  28  22   Calcium 8.9 - 10.3 mg/dL 9.6  9.8  9.6   Total Protein 6.5 - 8.1 g/dL 7.0  7.2  6.2   Total Bilirubin 0.3 - 1.2 mg/dL 0.9  0.6  0.4   Alkaline Phos 38 - 126 U/L 32  38    AST 15 - 41 U/L 13  17  14   ALT 0 - 44 U/L 11  15  10     Lab Results  Component Value Date   WBC 9.0 09/02/2022   HGB 12.2 09/02/2022   HCT 37.7 09/02/2022   MCV 84.2 09/02/2022   PLT 212 09/02/2022   NEUTROABS 5.7 09/02/2022    ASSESSMENT & PLAN:  No problem-specific Assessment & Plan notes found for this encounter.    No orders of the defined types were placed in this encounter.  The patient has a good understanding of the overall plan. she agrees with it. she will call with any problems that may develop before the next visit here. Total time spent: 30 mins including face to face time and time spent for planning, charting and co-ordination of care   Deritra L Mcnairy, CMA 09/29/22    I Deritra, Mcnairy am scribing for Dr. Gudena  ***  

## 2022-10-01 ENCOUNTER — Other Ambulatory Visit (HOSPITAL_COMMUNITY): Payer: Self-pay

## 2022-10-01 ENCOUNTER — Inpatient Hospital Stay
Payer: No Typology Code available for payment source | Attending: Hematology and Oncology | Admitting: Hematology and Oncology

## 2022-10-01 DIAGNOSIS — Z853 Personal history of malignant neoplasm of breast: Secondary | ICD-10-CM | POA: Insufficient documentation

## 2022-10-01 DIAGNOSIS — Z923 Personal history of irradiation: Secondary | ICD-10-CM | POA: Insufficient documentation

## 2022-10-01 DIAGNOSIS — Z79810 Long term (current) use of selective estrogen receptor modulators (SERMs): Secondary | ICD-10-CM | POA: Insufficient documentation

## 2022-10-01 DIAGNOSIS — Z170 Estrogen receptor positive status [ER+]: Secondary | ICD-10-CM | POA: Diagnosis not present

## 2022-10-01 DIAGNOSIS — C50212 Malignant neoplasm of upper-inner quadrant of left female breast: Secondary | ICD-10-CM | POA: Diagnosis not present

## 2022-10-01 MED ORDER — TAMOXIFEN CITRATE 20 MG PO TABS
20.0000 mg | ORAL_TABLET | Freq: Every day | ORAL | 3 refills | Status: DC
  Filled 2022-10-01 – 2022-10-29 (×2): qty 90, 90d supply, fill #0

## 2022-10-01 NOTE — Assessment & Plan Note (Signed)
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  Denies any hot flashes or myalgias.  Plan of treatment: 10 years of tamoxifen. If she wants to stop sooner than we will consider doing breast cancer index  Small skin lesion medial aspect of the left breast: Has remained the same.  She might see dermatologist to discuss removing it.  Breast cancer surveillance: 1. Mammogram 6/16/2023benign, breast density category B 2. Breast exam10/02/2022: Benign Return to clinic in 1 year for follow-up

## 2022-10-07 ENCOUNTER — Other Ambulatory Visit (HOSPITAL_COMMUNITY): Payer: Self-pay

## 2022-10-17 ENCOUNTER — Other Ambulatory Visit (HOSPITAL_COMMUNITY): Payer: Self-pay

## 2022-10-21 ENCOUNTER — Other Ambulatory Visit (HOSPITAL_COMMUNITY): Payer: Self-pay

## 2022-10-29 ENCOUNTER — Other Ambulatory Visit (HOSPITAL_COMMUNITY): Payer: Self-pay

## 2022-10-29 ENCOUNTER — Other Ambulatory Visit: Payer: Self-pay | Admitting: Family Medicine

## 2022-10-29 DIAGNOSIS — E782 Mixed hyperlipidemia: Secondary | ICD-10-CM

## 2022-10-29 MED ORDER — ATORVASTATIN CALCIUM 10 MG PO TABS
10.0000 mg | ORAL_TABLET | Freq: Every day | ORAL | 3 refills | Status: DC
  Filled 2022-10-29: qty 90, 90d supply, fill #0
  Filled 2023-02-28: qty 90, 90d supply, fill #1
  Filled 2023-06-24 (×2): qty 90, 90d supply, fill #2
  Filled 2023-09-13: qty 90, 90d supply, fill #3

## 2022-11-07 ENCOUNTER — Other Ambulatory Visit (HOSPITAL_COMMUNITY): Payer: Self-pay

## 2022-11-28 ENCOUNTER — Encounter: Payer: Self-pay | Admitting: Hematology and Oncology

## 2022-12-03 ENCOUNTER — Ambulatory Visit: Payer: No Typology Code available for payment source | Admitting: Internal Medicine

## 2022-12-03 ENCOUNTER — Encounter: Payer: Self-pay | Admitting: Internal Medicine

## 2022-12-03 VITALS — BP 108/64 | HR 75 | Ht 67.0 in | Wt 182.0 lb

## 2022-12-03 DIAGNOSIS — E1165 Type 2 diabetes mellitus with hyperglycemia: Secondary | ICD-10-CM | POA: Diagnosis not present

## 2022-12-03 DIAGNOSIS — E663 Overweight: Secondary | ICD-10-CM | POA: Diagnosis not present

## 2022-12-03 DIAGNOSIS — E785 Hyperlipidemia, unspecified: Secondary | ICD-10-CM

## 2022-12-03 LAB — POCT GLYCOSYLATED HEMOGLOBIN (HGB A1C): Hemoglobin A1C: 6.9 % — AB (ref 4.0–5.6)

## 2022-12-03 NOTE — Progress Notes (Signed)
Patient ID: Veronica Rogers, female   DOB: 26-Sep-1961, 61 y.o.   MRN: [id number]   HPI: Veronica Rogers is a 61 y.o.-year-old female, presenting presenting for f/u for DM2, dx in 2015 (after steroids), non-insulin-dependent, uncontrolled, without long term complications. Last visit 4 months ago.  Interim history: No increased urination, blurry vision, chest pain. She had some nausea and vomiting - occasionally, after eating certain foods.  Overall, she does not feel this is very bothersome and would like to continue with Rybelsus. On 09/02/2022 she was in the emergency room left hip pain and also nausea.  Of note, lipase was normal at 14.  Reviewed HbA1c levels: 08/01/2022: HbA1c 7.4% Lab Results  Component Value Date   HGBA1C 7.1 (A) 03/28/2022   HGBA1C 6.3 (A) 10/26/2021   HGBA1C 6.7 (H) 08/02/2021   Pt is on a regimen of:  - Metformin ER 1500 mg with dinner - Januvia 100 mg before breakfast >> Rybelsus 7 >> 14 mg before breakfast - Glipizide 5 mg before dinner >> Before a larger meal She was on insulin during ChTx. She was on regular metformin >> GI upset (diarrhea). She has frequent urination.  Pt checks her sugars 1-3 times a day: - am: 110-138, 144-157 >> 96, 124>> 103-162 (lately up to 147) - 2h after b'fast:95-101 >> 120 >> n/c >> 115 >> 159 - before lunch: n/c >> 125-130 >> n/c >> 111, 138 >> n/c - 2h after lunch:n/c >> 140-149 >> n/c >> 138 >> n/c - before dinner: 122, 140, 186 >> 150  >> n/c >> 145, 167 (after coffee) - 2h after dinner: 160s >> n/c >> 189 >> 127 >> n/c - bedtime: n/c  >> 125-135 >> 96, 198 (pizza) >> n/c - nighttime: 85, 89, 98, 120 >> 114 >> n/c >> 119 >> n/c >> 99 Lowest sugar was 118 >> 110 >> 96 >> 99;  she has hypoglycemia awareness in the 70s. Highest sugar was 200 x1 >> 157 >> 198 >> 167.  Glucometer: True test >> AccuChek guide >> Freestyle Lite  Pt's meals are: - Breakfast: cereals or 2 eggs or skips >> cereals -- Lunch: leftovers from home:  sandwich; soup; meat + veggies; chinese; salad - Dinner: eats out most dinners!  - No CKD, last BUN/creatinine:  Lab Results  Component Value Date   BUN 16 09/02/2022   CREATININE 0.68 09/02/2022   -+HL; last set of lipids: Lab Results  Component Value Date   CHOL 144 06/28/2022   HDL 50.20 06/28/2022   LDLCALC 65 06/28/2022   LDLDIRECT 73 12/28/2021   TRIG 141.0 06/28/2022   CHOLHDL 3 06/28/2022  On Lipitor 10 mg daily.  - last eye exam was on 12/2021: No DR. Dr. Katy Fitch.    -She denies numbness and tingling in her feet.  She has a history of plantar fasciitis.  Last foot exam 03/28/2022.  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 On Tamoxifen. She was found to have a low vitamin B12 on 06/28/2022: B12 139 - started on p.o. supplementation: 1000 mcg daily.  ROS: + see HPI + Acid reflux  I reviewed pt's medications, allergies, PMH, social hx, family hx, and changes were documented in the history of present illness. Otherwise, unchanged from my initial visit note.  Past Medical History:  Diagnosis Date   Arthritis    generalixed   Breast cancer ([place])    Contact lens/glasses fitting    wears contacts or glasses   Diabetes ([place]) 11/22/2014   type 2-  on meds   GERD (gastroesophageal reflux disease)    hx of   Hyperlipidemia    Personal history of chemotherapy    Personal history of radiation therapy    Radiation 03/28/14-05/06/14   Left Breast 60 Gy   Past Surgical History:  Procedure Laterality Date   BREAST BIOPSY Right 12/30/1994   breast, benign   BREAST LUMPECTOMY WITH NEEDLE LOCALIZATION AND AXILLARY SENTINEL LYMPH NODE BX Left 10/29/2013   Procedure: BREAST LUMPECTOMY WITH NEEDLE LOCALIZATION AND AXILLARY SENTINEL LYMPH NODE BX;  Surgeon: Rolm Bookbinder, MD;  Location: [facility];  Service: General;  Laterality: Left;   COLONOSCOPY  05/31/2011   DJ-F/V movi(good)-F/V-normal   TOTAL KNEE ARTHROPLASTY Right 09/11/2021   Procedure: TOTAL KNEE ARTHROPLASTY;  Surgeon:  Paralee Cancel, MD;  Location: [facility];  Service: Orthopedics;  Laterality: Right;   WISDOM TOOTH EXTRACTION     Social History   Social History   Marital Status: Married    Spouse Name: N/A   Number of Children: 2   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 (Patient not taking: Reported on 08/13/2022) 18 g 1   atorvastatin (LIPITOR) 10 MG tablet Take 1 tablet (10 mg total) by mouth daily. 90 tablet 3   celecoxib (CELEBREX) 200 MG capsule Take 1 capsule (200 mg total) by mouth 2 (two) times daily. 60 capsule 0   celecoxib (CELEBREX) 200 MG capsule Take 1 capsule (200 mg total) by mouth daily. For 2 weeks and stop. 30 capsule 1   ferrous sulfate 325 (65 FE) MG EC tablet Take 325 mg by mouth 3 (three) times daily with meals.     glipiZIDE (GLUCOTROL) 5 MG tablet Take 1-2 tablets (5-10 mg total) by mouth daily with an evening meal 180 tablet 3   glucose blood (FREESTYLE LITE) test strip Use as directed twice daily for glucose testing 100 each 12   glucose monitoring kit (FREESTYLE) monitoring kit 1 each by Does not apply route as needed for other. UAD for bid blood glucose testing Dx: E11.65 1 each 1   influenza vac split quadrivalent PF (FLUARIX) 0.5 ML injection Inject into the muscle. 0.5 mL 0   Lancets (FREESTYLE) lancets UAD for bid blood glucose testing Dx: E11.65 100 each 12   metFORMIN (GLUCOPHAGE-XR) 750 MG 24 hr tablet Take 2 tablets (1,500 mg total) by mouth at bedtime. 180 tablet 3   ondansetron (ZOFRAN-ODT) 4 MG disintegrating tablet Take 1 tablet (4 mg  total) by mouth every 8 (eight) hours as needed for nausea or vomiting. 10 tablet 0   oxybutynin (DITROPAN-XL) 10 MG 24 hr tablet Take 1 tablet (10 mg total) by mouth daily in the mid morning. 90 tablet 4   Semaglutide (RYBELSUS) 14 MG TABS Take 1 tablet (14 mg total) by mouth daily. 90 tablet 3   sertraline (ZOLOFT) 50 MG tablet Take 1 tablet (50 mg total) by mouth daily. 90 tablet 3   tamoxifen (NOLVADEX) 20 MG tablet Take 1 tablet (  20 mg total) by mouth daily. 90 tablet 3   vitamin B-12 (CYANOCOBALAMIN) 1000 MCG tablet Take 1 tablet (1,000 mcg total) by mouth daily. 90 tablet 1   No current facility-administered medications on file prior to visit.   Allergies  Allergen Reactions   Erythromycin Other (See Comments)    Stomach pain   B-Complex-B-12 [B Complex Vitamins] Itching and Rash    Itchy skin   Cefuroxime Other (See Comments)   Vitamin B12 Other (See Comments)   Tape Rash   Family History  Problem Relation Age of Onset   Hypertension Mother    Squamous cell carcinoma Father    Cancer Father    Diabetes Father    Diabetes Sister    Colon polyps Neg Hx    Colon cancer Neg Hx    Esophageal cancer Neg Hx    Stomach cancer Neg Hx    Rectal cancer Neg Hx    PE: BP 108/64 (BP Location: Right Arm, Patient Position: Sitting, Cuff Size: Normal)   Pulse 75   Ht _0  (1.702 m)   Wt 182 lb (82.6 kg)   LMP 11/08/2013   SpO2 99%   BMI 28.51 kg/m   Wt Readings from Last 3 Encounters:  12/03/22 182 lb (82.6 kg)  10/01/22 184 lb 9.6 oz (83.7 kg)  09/12/22 189 lb (85.7 kg)   Constitutional: overweight, in NAD Eyes:  EOMI, no exophthalmos ENT: moist mucous membranes, no thyromegaly, no cervical lymphadenopathy Cardiovascular: RRR, No MRG Respiratory: CTA B Musculoskeletal: no deformities Skin: no rashes Neurological: no tremor with outstretched hands  ASSESSMENT: 1. DM2, non-insulin-dependent, uncontrolled, without long term complications, but with hyperglycemia  No FH  of MTC or personal hx of pancreatitis.  2. HL  3.  Overweight  PLAN:  1. Patient with longstanding, uncontrolled, type 2 diabetes, on oral antidiabetic regimen with metformin, sulfonylurea (prn before a larger dinner) and p.o. GLP-1 receptor agonist with worsening control before last visit.  At last visit, sugars appears to be higher so we increased the Rybelsus dose.  We did discuss about Ozempic but she wanted to avoid injectables at that time.  She was telling me that her husband is using this with good results, however. -At today's visit, blood sugars appear to be mostly at goal but she does have occasional hyperglycemic spikes, after dietary indiscretions or after forgetting to take metformin at night.  We discussed about moving metformin on the nightstand or in the bathroom to be more easily available.  She occasional nausea with certain foods and even had vomiting but she mentions that she is happy with Rybelsus and would like to continue.  Lipase was not elevated months ago. -For now, we discussed about continuing the same regimen.  She is seldom taking glipizide but I encouraged her to take it before larger meals, especially now during the holidays. - I suggested to:  Patient Instructions  Please continue:  - Metformin ER 1500 mg with dinner - Glipizide 5 mg - before a larger dinner - Rybelsus 14 mg before b'fast  Please return in 4 months with your sugar log.  - we checked her HbA1c: 6.9% (lower) - advised to check sugars at different times of the day - 1x a day, rotating check times - advised for yearly eye exams >> she is UTD - return to clinic in 4 months   2. HL -Reviewed latest lipid panel from 6 months ago: Fractions at goal: Lab Results  Component Value Date  CHOL 144 06/28/2022   HDL 50.20 06/28/2022   LDLCALC 65 06/28/2022   LDLDIRECT 73 12/28/2021   TRIG 141.0 06/28/2022   CHOLHDL 3 06/28/2022  -Triglycerides improved from 700s -She continues on Lipitor 10 mg  daily - no SEs.  3.  Overweight -Rybelsus, which should also help with weight loss -Weight was stable at last visit - since then she lost 7 pounds  Philemon Kingdom, MD PhD [facility] Endocrinology

## 2022-12-03 NOTE — Addendum Note (Signed)
Addended by: Lauralyn Primes on: 12/03/2022 08:03 AM   Modules accepted: Orders

## 2022-12-03 NOTE — Patient Instructions (Signed)
Please continue:  - Metformin ER 1500 mg with dinner - Glipizide 5 mg - before a larger dinner - Rybelsus 14 mg before b'fast  Please return in 4 months with your sugar log.

## 2022-12-05 ENCOUNTER — Other Ambulatory Visit (HOSPITAL_COMMUNITY): Payer: Self-pay

## 2022-12-25 ENCOUNTER — Other Ambulatory Visit (HOSPITAL_COMMUNITY): Payer: Self-pay

## 2022-12-25 ENCOUNTER — Other Ambulatory Visit: Payer: Self-pay | Admitting: *Deleted

## 2022-12-25 ENCOUNTER — Encounter: Payer: Self-pay | Admitting: Hematology and Oncology

## 2022-12-25 DIAGNOSIS — C50212 Malignant neoplasm of upper-inner quadrant of left female breast: Secondary | ICD-10-CM

## 2022-12-25 MED ORDER — TAMOXIFEN CITRATE 20 MG PO TABS
20.0000 mg | ORAL_TABLET | Freq: Every day | ORAL | 3 refills | Status: DC
  Filled 2022-12-25 – 2023-02-20 (×2): qty 90, 90d supply, fill #0
  Filled 2023-04-04: qty 90, 90d supply, fill #1
  Filled 2023-04-04: qty 30, 30d supply, fill #1
  Filled 2023-06-24 (×3): qty 30, 30d supply, fill #2
  Filled 2023-07-21: qty 30, 30d supply, fill #3
  Filled 2023-08-18: qty 30, 30d supply, fill #4
  Filled 2023-09-13: qty 30, 30d supply, fill #5

## 2023-01-13 ENCOUNTER — Other Ambulatory Visit (HOSPITAL_COMMUNITY): Payer: Self-pay

## 2023-01-13 ENCOUNTER — Encounter: Payer: Self-pay | Admitting: Family Medicine

## 2023-01-13 ENCOUNTER — Other Ambulatory Visit: Payer: Self-pay

## 2023-01-13 DIAGNOSIS — J209 Acute bronchitis, unspecified: Secondary | ICD-10-CM

## 2023-01-13 MED ORDER — ALBUTEROL SULFATE HFA 108 (90 BASE) MCG/ACT IN AERS
1.0000 | INHALATION_SPRAY | Freq: Four times a day (QID) | RESPIRATORY_TRACT | 1 refills | Status: DC | PRN
  Filled 2023-01-13: qty 6.7, 25d supply, fill #0

## 2023-02-04 ENCOUNTER — Other Ambulatory Visit: Payer: Self-pay | Admitting: Internal Medicine

## 2023-02-04 ENCOUNTER — Other Ambulatory Visit (HOSPITAL_COMMUNITY): Payer: Self-pay

## 2023-02-04 DIAGNOSIS — E1165 Type 2 diabetes mellitus with hyperglycemia: Secondary | ICD-10-CM

## 2023-02-04 MED ORDER — METFORMIN HCL ER 750 MG PO TB24
1500.0000 mg | ORAL_TABLET | Freq: Every day | ORAL | 3 refills | Status: DC
  Filled 2023-02-04: qty 180, 90d supply, fill #0
  Filled 2023-05-02: qty 180, 90d supply, fill #1
  Filled 2023-09-13: qty 180, 90d supply, fill #2
  Filled 2023-12-15: qty 180, 90d supply, fill #3

## 2023-02-05 ENCOUNTER — Encounter: Payer: Self-pay | Admitting: Hematology and Oncology

## 2023-02-18 ENCOUNTER — Other Ambulatory Visit (HOSPITAL_COMMUNITY): Payer: Self-pay

## 2023-02-19 ENCOUNTER — Other Ambulatory Visit (HOSPITAL_COMMUNITY): Payer: Self-pay

## 2023-02-19 MED ORDER — FLUCONAZOLE 150 MG PO TABS
150.0000 mg | ORAL_TABLET | Freq: Every day | ORAL | 0 refills | Status: DC
  Filled 2023-02-19: qty 2, 4d supply, fill #0

## 2023-02-20 ENCOUNTER — Other Ambulatory Visit (HOSPITAL_COMMUNITY): Payer: Self-pay

## 2023-02-21 ENCOUNTER — Other Ambulatory Visit (HOSPITAL_COMMUNITY): Payer: Self-pay

## 2023-02-28 ENCOUNTER — Other Ambulatory Visit (HOSPITAL_COMMUNITY): Payer: Self-pay

## 2023-02-28 ENCOUNTER — Other Ambulatory Visit: Payer: Self-pay

## 2023-02-28 MED ORDER — FLUCONAZOLE 150 MG PO TABS
150.0000 mg | ORAL_TABLET | Freq: Every day | ORAL | 1 refills | Status: DC
  Filled 2023-02-28: qty 2, 4d supply, fill #0

## 2023-03-03 ENCOUNTER — Encounter: Payer: Self-pay | Admitting: Internal Medicine

## 2023-03-03 ENCOUNTER — Other Ambulatory Visit (HOSPITAL_COMMUNITY): Payer: Self-pay

## 2023-03-04 ENCOUNTER — Other Ambulatory Visit (HOSPITAL_COMMUNITY): Payer: Self-pay

## 2023-03-04 MED ORDER — OXYBUTYNIN CHLORIDE ER 10 MG PO TB24
10.0000 mg | ORAL_TABLET | Freq: Every day | ORAL | 4 refills | Status: DC
  Filled 2023-03-04: qty 90, 90d supply, fill #0
  Filled 2023-05-28: qty 90, 90d supply, fill #1
  Filled 2023-08-29: qty 90, 90d supply, fill #2
  Filled 2023-12-01: qty 90, 90d supply, fill #3
  Filled 2024-02-25: qty 90, 90d supply, fill #4

## 2023-03-11 ENCOUNTER — Other Ambulatory Visit (HOSPITAL_COMMUNITY): Payer: Self-pay

## 2023-04-01 ENCOUNTER — Other Ambulatory Visit: Payer: Self-pay

## 2023-04-03 ENCOUNTER — Encounter: Payer: Self-pay | Admitting: Internal Medicine

## 2023-04-03 ENCOUNTER — Other Ambulatory Visit (HOSPITAL_COMMUNITY): Payer: Self-pay

## 2023-04-03 ENCOUNTER — Ambulatory Visit: Payer: 59 | Admitting: Internal Medicine

## 2023-04-03 VITALS — BP 124/72 | HR 84 | Ht 67.0 in | Wt 183.6 lb

## 2023-04-03 DIAGNOSIS — E1165 Type 2 diabetes mellitus with hyperglycemia: Secondary | ICD-10-CM | POA: Diagnosis not present

## 2023-04-03 DIAGNOSIS — E785 Hyperlipidemia, unspecified: Secondary | ICD-10-CM | POA: Diagnosis not present

## 2023-04-03 DIAGNOSIS — E663 Overweight: Secondary | ICD-10-CM | POA: Diagnosis not present

## 2023-04-03 LAB — POCT GLYCOSYLATED HEMOGLOBIN (HGB A1C): Hemoglobin A1C: 6.8 % — AB (ref 4.0–5.6)

## 2023-04-03 MED ORDER — FREESTYLE FREEDOM LITE W/DEVICE KIT
PACK | 0 refills | Status: AC
  Filled 2023-04-03: qty 1, 30d supply, fill #0

## 2023-04-03 MED ORDER — FREESTYLE LITE TEST VI STRP
ORAL_STRIP | 3 refills | Status: DC
  Filled 2023-04-03: qty 100, 50d supply, fill #0
  Filled 2024-03-10: qty 100, 50d supply, fill #1

## 2023-04-03 NOTE — Progress Notes (Signed)
Patient ID: Veronica Rogers, female   DOB: [birth year], 62 y.o.   MRN: [id number]   HPI: Veronica Rogers is a 62 y.o.-year-old female, presenting presenting for f/u for DM2, dx in 2015 (after steroids), non-insulin-dependent, uncontrolled, without long term complications. Last visit 4 months ago.  Interim history: No increased urination, blurry vision, chest pain.   She still has occasional nausea and vomiting after eating certain foods.   Reviewed HbA1c levels: Lab Results  Component Value Date   HGBA1C 6.9 (A) 12/03/2022   HGBA1C 7.4 (A) 08/01/2022   HGBA1C 7.1 (A) 03/28/2022  08/01/2022: HbA1c 7.4%  Pt is on a regimen of:  - Metformin ER 1500 mg with dinner - Januvia 100 mg before breakfast >> Rybelsus 7 >> 14 mg before breakfast - Glipizide 5 mg before dinner >> Before a larger meal >> not used since last OV She was on insulin during ChTx. She was on regular metformin >> GI upset (diarrhea). She has frequent urination.  Pt checks her sugars 1-3 times a day: - am: 110-138, 144-157 >> 96, 124>> 103-162 >> 103-132, 149 - 2h after b'fast:95-101 >> 120 >> n/c >> 115 >> 159 >> n/c - before lunch: n/c >> 125-130 >> n/c >> 111, 138 >> n/c - 2h after lunch:n/c >> 140-149 >> n/c >> 138 >> n/c - before dinner: 122, 140, 186 >> 150  >> n/c >> 145, 167 >> n/c - 2h after dinner: 160s >> n/c >> 189 >> 127 >> n/c - bedtime: n/c  >> 125-135 >> 96, 198 (pizza) >> n/c - nighttime:  114 >> n/c >> 119 >> n/c >> 99 >> 91-125 Lowest sugar was 96 >> 99>> 91;  she has hypoglycemia awareness in the 70s. Highest sugar was 198 >> 167 >> 149.  Glucometer: True test >> AccuChek guide >> Freestyle Lite  Pt's meals are: - Breakfast: cereals or 2 eggs or skips >> cereals -- Lunch: leftovers from home: sandwich; soup; meat + veggies; chinese; salad - Dinner: eats out most dinners!  - No CKD, last BUN/creatinine:  Lab Results  Component Value Date   BUN 16 09/02/2022   CREATININE 0.68 09/02/2022    -+HL; last set of lipids: Lab Results  Component Value Date   CHOL 144 06/28/2022   HDL 50.20 06/28/2022   LDLCALC 65 06/28/2022   LDLDIRECT 73 12/28/2021   TRIG 141.0 06/28/2022   CHOLHDL 3 06/28/2022  On Lipitor 10 mg daily.  - last eye exam was on 12/2021: No DR. Dr. Katy Fitch.    -She denies numbness and tingling in her feet.  She has a history of plantar fasciitis.  Last foot exam 03/28/2022.  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 On Tamoxifen. She was found to have a low vitamin B12 on 06/28/2022: B12 139 - started on p.o. supplementation: 1000 mcg daily. On 09/02/2022 she was in the emergency room for left hip pain and also nausea.  Of note, lipase was normal at 14.  ROS: + see HPI + Acid reflux  I reviewed pt's medications, allergies, PMH, social hx, family hx, and changes were documented in the history of present illness. Otherwise, unchanged from my initial visit note.  Past Medical History:  Diagnosis Date   Arthritis    generalixed   Breast cancer ([place])    Contact lens/glasses fitting    wears contacts or glasses   Diabetes ([place]) 11/22/2014   type 2- on meds   GERD (gastroesophageal reflux disease)    hx of  Hyperlipidemia    Personal history of chemotherapy    Personal history of radiation therapy    Radiation 03/28/14-05/06/14   Left Breast 60 Gy   Past Surgical History:  Procedure Laterality Date   BREAST BIOPSY Right 12/30/1994   breast, benign   BREAST LUMPECTOMY WITH NEEDLE LOCALIZATION AND AXILLARY SENTINEL LYMPH NODE BX Left 10/29/2013   Procedure: BREAST LUMPECTOMY WITH NEEDLE LOCALIZATION AND AXILLARY SENTINEL LYMPH NODE BX;  Surgeon: Rolm Bookbinder, MD;  Location: [facility];  Service: General;  Laterality: Left;   COLONOSCOPY  05/31/2011   DJ-F/V movi(good)-F/V-normal   TOTAL KNEE ARTHROPLASTY Right 09/11/2021   Procedure: TOTAL KNEE ARTHROPLASTY;  Surgeon: Paralee Cancel, MD;  Location: [facility];  Service: Orthopedics;  Laterality: Right;   WISDOM  TOOTH EXTRACTION     Social History   Social History   Marital Status: Married    Spouse Name: N/A   Number of Children: 2   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 6.7 g 1   atorvastatin (LIPITOR) 10 MG tablet Take 1 tablet (10 mg total) by mouth daily. 90 tablet 3   celecoxib (CELEBREX) 200 MG capsule Take 1 capsule (200 mg total) by mouth 2 (two) times daily. 60 capsule 0   celecoxib (CELEBREX) 200 MG capsule Take 1 capsule (200 mg total) by mouth daily. For 2 weeks and stop. 30 capsule 1   ferrous sulfate 325 (65 FE) MG EC tablet Take 325 mg by mouth 3 (three) times daily with meals.     fluconazole (DIFLUCAN) 150 MG tablet Take 1 tablet (150 mg total) by mouth once, then take 1 tablet 3 days later for yeast infection. 2 tablet 1   glipiZIDE (GLUCOTROL) 5 MG tablet Take 1-2 tablets (5-10 mg total) by mouth daily with an evening meal 180 tablet 3   glucose blood (FREESTYLE LITE) test strip Use as directed twice daily for glucose testing 100 each 12   glucose monitoring kit (FREESTYLE) monitoring kit 1 each by Does not apply route as needed for other. UAD for bid blood glucose testing Dx: E11.65 1 each 1   influenza vac split quadrivalent PF (FLUARIX) 0.5 ML injection Inject into the muscle. 0.5 mL 0   Lancets (FREESTYLE) lancets UAD for bid blood glucose testing Dx: E11.65 100 each 12   metFORMIN (GLUCOPHAGE-XR) 750 MG 24 hr tablet Take 2 tablets (1,500 mg total) by mouth at bedtime. 180 tablet 3   ondansetron (ZOFRAN-ODT) 4 MG disintegrating tablet Take 1  tablet (4 mg total) by mouth every 8 (eight) hours as needed for nausea or vomiting. 10 tablet 0   oxybutynin (DITROPAN-XL) 10 MG 24 hr tablet Take 1 tablet (10 mg total) by mouth daily in the mid-morning 90 tablet 4   Semaglutide (RYBELSUS) 14 MG TABS Take 1 tablet (14 mg total) by mouth daily. 90 tablet 3   sertraline (ZOLOFT) 50 MG tablet Take 1 tablet (50 mg total) by mouth daily. 90 tablet 3   tamoxifen (  NOLVADEX) 20 MG tablet Take 1 tablet (20 mg total) by mouth daily. 90 tablet 3   vitamin B-12 (CYANOCOBALAMIN) 1000 MCG tablet Take 1 tablet (1,000 mcg total) by mouth daily. 90 tablet 1   No current facility-administered medications on file prior to visit.   Allergies  Allergen Reactions   Erythromycin Other (See Comments)    Stomach pain   B-Complex-B-12 [B Complex Vitamins] Itching and Rash    Itchy skin   Cefuroxime Other (See Comments)   Vitamin B12 Other (See Comments)   Tape Rash   Family History  Problem Relation Age of Onset   Hypertension Mother    Squamous cell carcinoma Father    Cancer Father    Diabetes Father    Diabetes Sister    Colon polyps Neg Hx    Colon cancer Neg Hx    Esophageal cancer Neg Hx    Stomach cancer Neg Hx    Rectal cancer Neg Hx    PE: BP 124/72 (BP Location: Right Arm, Patient Position: Sitting, Cuff Size: Normal)   Pulse 84   Ht 5\' 7"  (1.702 m)   Wt 183 lb 9.6 oz (83.3 kg)   LMP 11/08/2013   SpO2 97%   BMI 28.76 kg/m   Wt Readings from Last 3 Encounters:  04/03/23 183 lb 9.6 oz (83.3 kg)  12/03/22 182 lb (82.6 kg)  10/01/22 184 lb 9.6 oz (83.7 kg)   Constitutional: overweight, in NAD Eyes:  EOMI, no exophthalmos ENT: no thyromegaly, no cervical lymphadenopathy Cardiovascular: RRR, No MRG Respiratory: CTA B Musculoskeletal: no deformities Skin: no rashes Neurological: no tremor with outstretched hands Diabetic Foot Exam - Simple   Simple Foot Form Diabetic Foot exam was performed with the following findings: Yes 04/03/2023   8:55 AM  Visual Inspection No deformities, no ulcerations, no other skin breakdown bilaterally: Yes Sensation Testing Intact to touch and monofilament testing bilaterally: Yes Pulse Check Posterior Tibialis and Dorsalis pulse intact bilaterally: Yes Comments    ASSESSMENT: 1. DM2, non-insulin-dependent, uncontrolled, without long term complications, but with hyperglycemia  No FH of MTC or personal hx of pancreatitis.  2. HL  3.  Overweight  PLAN:  1. Patient with longstanding, uncontrolled, type 2 diabetes, on oral antidiabetic regimen with metformin, sulfonylurea as needed, and p.o. GLP-1 receptor agonist, with improved control at last visit and an HbA1c of 6.9%, lower.  Sugars appears to be mostly at goal with only occasional hyperglycemic spikes after dietary indiscretions or forgetting to take metformin at night.  We discussed neuropathy metformin on her nightstand requiring the bathroom to be more easily available.  She had nausea with certain foods and occasional vomiting but overall, she wanted to continue Rybelsus.  Lipase was not elevated per review of labs from last year.  We did not change the regimen at that time. - At today's visit, blood sugars are mostly at goal with only very mild hyperglycemic exceptions-in the morning.  No need to change her regimen for now.  She continues to have occasional regurgitation with Rybelsus but upon questioning, this happens if she eats larger meals.  We discussed about stopping eating at the first sign that she is full. - I suggested to:  Patient Instructions  Please continue:  - Metformin ER 1500 mg with dinner - Glipizide 5 mg - before a larger dinner - Rybelsus 14 mg before b'fast  Please return in 4-6 months with your sugar log.  - we checked her HbA1c: 6.8% (slightly lower -  advised to check sugars at different times of the day - 1-2x a day, rotating check times - advised for yearly eye exams >> she is due - appt coming up in  05/2023 - return to clinic in 4-6 months   2. HL -Reviewed latest lipid panel from 05/2022: All fractions at goal: Lab Results  Component Value Date   CHOL 144 06/28/2022   HDL 50.20 06/28/2022   LDLCALC 65 06/28/2022   LDLDIRECT 73 12/28/2021   TRIG 141.0 06/28/2022   CHOLHDL 3 06/28/2022  -Triglycerides improved from 700s -she continues on Lipitor 10 mg daily without side effects  3.  Overweight -will continue Rybelsus, which should also help with weight loss -She lost 7 pounds before last visit and gained 1 pound since then  Philemon Kingdom, MD PhD [facility] Endocrinology

## 2023-04-03 NOTE — Patient Instructions (Signed)
Please continue:  - Metformin ER 1500 mg with dinner - Glipizide 5 mg - before a larger dinner - Rybelsus 14 mg before b'fast  Please return in 4-6 months with your sugar log.

## 2023-04-04 ENCOUNTER — Other Ambulatory Visit (HOSPITAL_COMMUNITY): Payer: Self-pay

## 2023-05-02 ENCOUNTER — Other Ambulatory Visit (HOSPITAL_COMMUNITY): Payer: Self-pay

## 2023-05-22 ENCOUNTER — Other Ambulatory Visit: Payer: Self-pay | Admitting: Hematology and Oncology

## 2023-05-22 DIAGNOSIS — Z1231 Encounter for screening mammogram for malignant neoplasm of breast: Secondary | ICD-10-CM

## 2023-05-28 ENCOUNTER — Encounter: Payer: Self-pay | Admitting: Internal Medicine

## 2023-06-03 ENCOUNTER — Encounter: Payer: Self-pay | Admitting: Family Medicine

## 2023-06-03 DIAGNOSIS — H2513 Age-related nuclear cataract, bilateral: Secondary | ICD-10-CM | POA: Diagnosis not present

## 2023-06-03 DIAGNOSIS — E119 Type 2 diabetes mellitus without complications: Secondary | ICD-10-CM | POA: Diagnosis not present

## 2023-06-03 DIAGNOSIS — H43811 Vitreous degeneration, right eye: Secondary | ICD-10-CM | POA: Diagnosis not present

## 2023-06-03 DIAGNOSIS — H43812 Vitreous degeneration, left eye: Secondary | ICD-10-CM | POA: Diagnosis not present

## 2023-06-03 LAB — HM DIABETES EYE EXAM

## 2023-06-24 ENCOUNTER — Ambulatory Visit
Admission: RE | Admit: 2023-06-24 | Discharge: 2023-06-24 | Disposition: A | Discharge status: Home / Self Care | Payer: 59 | Source: Ambulatory Visit | Attending: Hematology and Oncology | Admitting: Hematology and Oncology

## 2023-06-24 ENCOUNTER — Other Ambulatory Visit (HOSPITAL_COMMUNITY): Payer: Self-pay

## 2023-06-24 DIAGNOSIS — Z1231 Encounter for screening mammogram for malignant neoplasm of breast: Secondary | ICD-10-CM | POA: Diagnosis not present

## 2023-06-25 ENCOUNTER — Other Ambulatory Visit: Payer: Self-pay

## 2023-06-27 ENCOUNTER — Other Ambulatory Visit: Payer: Self-pay

## 2023-06-27 ENCOUNTER — Other Ambulatory Visit (HOSPITAL_COMMUNITY): Payer: Self-pay

## 2023-06-27 MED ORDER — SERTRALINE HCL 50 MG PO TABS
50.0000 mg | ORAL_TABLET | Freq: Every day | ORAL | 3 refills | Status: DC
  Filled 2023-06-27: qty 90, 90d supply, fill #0
  Filled 2023-09-25: qty 90, 90d supply, fill #1
  Filled 2023-12-29: qty 90, 90d supply, fill #2
  Filled 2024-03-26: qty 90, 90d supply, fill #3

## 2023-07-01 ENCOUNTER — Other Ambulatory Visit (HOSPITAL_COMMUNITY): Payer: Self-pay

## 2023-07-28 ENCOUNTER — Other Ambulatory Visit (HOSPITAL_COMMUNITY): Payer: Self-pay

## 2023-08-04 ENCOUNTER — Encounter: Payer: Self-pay | Admitting: Family Medicine

## 2023-08-04 ENCOUNTER — Ambulatory Visit: Payer: 59 | Admitting: Family Medicine

## 2023-08-04 VITALS — BP 106/72 | HR 76 | Temp 97.3°F | Ht 67.0 in | Wt 186.2 lb

## 2023-08-04 DIAGNOSIS — E782 Mixed hyperlipidemia: Secondary | ICD-10-CM | POA: Diagnosis not present

## 2023-08-04 DIAGNOSIS — E538 Deficiency of other specified B group vitamins: Secondary | ICD-10-CM | POA: Diagnosis not present

## 2023-08-04 DIAGNOSIS — Z0001 Encounter for general adult medical examination with abnormal findings: Secondary | ICD-10-CM | POA: Diagnosis not present

## 2023-08-04 DIAGNOSIS — E1165 Type 2 diabetes mellitus with hyperglycemia: Secondary | ICD-10-CM | POA: Diagnosis not present

## 2023-08-04 DIAGNOSIS — D229 Melanocytic nevi, unspecified: Secondary | ICD-10-CM | POA: Diagnosis not present

## 2023-08-04 DIAGNOSIS — Z0000 Encounter for general adult medical examination without abnormal findings: Secondary | ICD-10-CM

## 2023-08-04 LAB — CBC WITH DIFFERENTIAL/PLATELET
Basophils Absolute: 0 10*3/uL (ref 0.0–0.1)
Basophils Relative: 0.5 % (ref 0.0–3.0)
Eosinophils Absolute: 0.1 10*3/uL (ref 0.0–0.7)
Eosinophils Relative: 1.5 % (ref 0.0–5.0)
HCT: 37.7 % (ref 36.0–46.0)
Hemoglobin: 12.1 g/dL (ref 12.0–15.0)
Lymphocytes Relative: 39 % (ref 12.0–46.0)
Lymphs Abs: 3.3 10*3/uL (ref 0.7–4.0)
MCHC: 32 g/dL (ref 30.0–36.0)
MCV: 83.4 fl (ref 78.0–100.0)
Monocytes Absolute: 0.4 10*3/uL (ref 0.1–1.0)
Monocytes Relative: 4.8 % (ref 3.0–12.0)
Neutro Abs: 4.5 10*3/uL (ref 1.4–7.7)
Neutrophils Relative %: 54.2 % (ref 43.0–77.0)
Platelets: 210 10*3/uL (ref 150.0–400.0)
RBC: 4.53 Mil/uL (ref 3.87–5.11)
RDW: 14 % (ref 11.5–15.5)
WBC: 8.3 10*3/uL (ref 4.0–10.5)

## 2023-08-04 LAB — URINALYSIS, ROUTINE W REFLEX MICROSCOPIC
Bilirubin Urine: NEGATIVE
Hgb urine dipstick: NEGATIVE
Leukocytes,Ua: NEGATIVE
Nitrite: NEGATIVE
RBC / HPF: NONE SEEN (ref 0–?)
Specific Gravity, Urine: >=1.03 — AB (ref 1.000–1.030)
Total Protein, Urine: NEGATIVE
Urine Glucose: NEGATIVE
Urobilinogen, UA: 0.2 (ref 0.0–1.0)
pH: 5.5 (ref 5.0–8.0)

## 2023-08-04 LAB — COMPREHENSIVE METABOLIC PANEL
ALT: 10 U/L (ref 0–35)
AST: 12 U/L (ref 0–37)
Albumin: 4.4 g/dL (ref 3.5–5.2)
Alkaline Phosphatase: 38 U/L — ABNORMAL LOW (ref 39–117)
BUN: 17 mg/dL (ref 6–23)
CO2: 29 mEq/L (ref 19–32)
Calcium: 9.6 mg/dL (ref 8.4–10.5)
Chloride: 102 mEq/L (ref 96–112)
Creatinine, Ser: 0.66 mg/dL (ref 0.40–1.20)
GFR: 93.72 mL/min (ref >60.00)
Glucose, Bld: 99 mg/dL (ref 70–99)
Potassium: 3.9 mEq/L (ref 3.5–5.1)
Sodium: 140 mEq/L (ref 135–145)
Total Bilirubin: 0.4 mg/dL (ref 0.2–1.2)
Total Protein: 6.5 g/dL (ref 6.0–8.3)

## 2023-08-04 LAB — LIPID PANEL
Cholesterol: 161 mg/dL (ref 0–200)
HDL: 47.4 mg/dL (ref >39.00)
LDL Cholesterol: 74 mg/dL (ref 0–99)
NonHDL: 113.11
Total CHOL/HDL Ratio: 3
Triglycerides: 196 mg/dL — ABNORMAL HIGH (ref 0.0–149.0)
VLDL: 39.2 mg/dL (ref 0.0–40.0)

## 2023-08-04 LAB — MICROALBUMIN / CREATININE URINE RATIO
Creatinine,U: 100.7 mg/dL
Microalb Creat Ratio: 0.7 mg/g (ref 0.0–30.0)
Microalb, Ur: <0.7 mg/dL (ref 0.0–1.9)

## 2023-08-04 LAB — HEMOGLOBIN A1C: Hgb A1c MFr Bld: 7.5 % — ABNORMAL HIGH (ref 4.6–6.5)

## 2023-08-04 NOTE — Progress Notes (Signed)
Established Patient Office Visit   Subjective:  Patient ID: Veronica Rogers, female    DOB: April 16, 1961  Age: 62 y.o. MRN: [id number]  Chief Complaint  Patient presents with   Annual Exam    CPE. Pt is fasting. Slight right lower back pain.     HPI Encounter Diagnoses  Name Primary?   Mixed hyperlipidemia Yes   Type 2 diabetes mellitus with hyperglycemia, without long-term current use of insulin (HCC)    Healthcare maintenance    Atypical nevus    For yearly health check.  Doing well.  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  Continues to work full-time.  She has regular dental care.  Up-to-date on health maintenance.  Enjoys frequent trips to the beach with her husband.  Concerned about lesion on her left upper back.  Continues follow-up with endocrinology for type 2 diabetes.  Continues with metformin and glipizide and Rybelsus.  Continues low-dose atorvastatin for elevated LDL cholesterol.  Continues tamoxifen for ongoing surveillance with history of E2 positive cell carcinoma.   Review of Systems  Constitutional: Negative.   HENT: Negative.    Eyes:  Negative for blurred vision, discharge and redness.  Respiratory: Negative.    Cardiovascular: Negative.   Gastrointestinal:  Negative for abdominal pain.  Genitourinary: Negative.   Musculoskeletal: Negative.  Negative for myalgias.  Skin:  Negative for rash.  Neurological:  Negative for tingling, loss of consciousness and weakness.  Endo/Heme/Allergies:  Negative for polydipsia.      06/28/2022    1:08 PM 02/15/2022    1:31 PM 07/17/2021    3:40 PM  Depression screen PHQ 2/9  Decreased Interest 0 0 0  Down, Depressed, Hopeless 0 0 0  PHQ - 2 Score 0 0 0       Current Outpatient Medications:    albuterol (VENTOLIN HFA) 108 (90 Base) MCG/ACT inhaler, Inhale 1-2 puffs into the lungs every 6 (six) hours as needed for wheezing or shortness of breath., Disp: 6.7 g, Rfl: 1   atorvastatin (LIPITOR) 10 MG tablet, Take 1  tablet (10 mg total) by mouth daily., Disp: 90 tablet, Rfl: 3   Blood Glucose Monitoring Suppl (FREESTYLE FREEDOM LITE) w/Device KIT, Use as advised, Disp: 1 kit, Rfl: 0   celecoxib (CELEBREX) 200 MG capsule, Take 1 capsule (200 mg total) by mouth 2 (two) times daily., Disp: 60 capsule, Rfl: 0   celecoxib (CELEBREX) 200 MG capsule, Take 1 capsule (200 mg total) by mouth daily. For 2 weeks and stop., Disp: 30 capsule, Rfl: 1   ferrous sulfate 325 (65 FE) MG EC tablet, Take 325 mg by mouth 3 (three) times daily with meals., Disp: , Rfl:    fluconazole (DIFLUCAN) 150 MG tablet, Take 1 tablet (150 mg total) by mouth once, then take 1 tablet 3 days later for yeast infection., Disp: 2 tablet, Rfl: 1   glipiZIDE (GLUCOTROL) 5 MG tablet, Take 1-2 tablets (5-10 mg total) by mouth daily with an evening meal, Disp: 180 tablet, Rfl: 3   glucose blood (FREESTYLE LITE) test strip, Use as directed twice daily for glucose testing, Disp: 200 each, Rfl: 3   influenza vac split quadrivalent PF (FLUARIX) 0.5 ML injection, Inject into the muscle., Disp: 0.5 mL, Rfl: 0   Lancets (FREESTYLE) lancets, UAD for bid blood glucose testing Dx: E11.65, Disp: 100 each, Rfl: 12   metFORMIN (GLUCOPHAGE-XR) 750 MG 24 hr tablet, Take 2 tablets (1,500 mg total) by mouth at bedtime., Disp: 180 tablet,  Rfl: 3   ondansetron (ZOFRAN-ODT) 4 MG disintegrating tablet, Take 1 tablet (4 mg total) by mouth every 8 (eight) hours as needed for nausea or vomiting., Disp: 10 tablet, Rfl: 0   oxybutynin (DITROPAN-XL) 10 MG 24 hr tablet, Take 1 tablet (10 mg total) by mouth daily in the mid-morning, Disp: 90 tablet, Rfl: 4   Semaglutide (RYBELSUS) 14 MG TABS, Take 1 tablet (14 mg total) by mouth daily., Disp: 90 tablet, Rfl: 3   sertraline (ZOLOFT) 50 MG tablet, Take 1 tablet (50 mg total) by mouth daily., Disp: 90 tablet, Rfl: 3   tamoxifen (NOLVADEX) 20 MG tablet, Take 1 tablet (20 mg total) by mouth daily., Disp: 90 tablet, Rfl: 3   vitamin B-12  (CYANOCOBALAMIN) 1000 MCG tablet, Take 1 tablet (1,000 mcg total) by mouth daily., Disp: 90 tablet, Rfl: 1   Objective:     BP 106/72   Pulse 76   Temp (!) 97.3 F (36.3 C)   Ht 5\' 7"  (1.702 m)   Wt 186 lb 3.2 oz (84.5 kg)   LMP 11/08/2013   SpO2 97%   BMI 29.16 kg/m    Physical Exam Constitutional:      General: She is not in acute distress.    Appearance: Normal appearance. She is not ill-appearing, toxic-appearing or diaphoretic.  HENT:     Head: Normocephalic and atraumatic.     Right Ear: Tympanic membrane, ear canal and external ear normal.     Left Ear: Tympanic membrane, ear canal and external ear normal.     Mouth/Throat:     Mouth: Mucous membranes are moist.     Pharynx: Oropharynx is clear. No oropharyngeal exudate or posterior oropharyngeal erythema.  Eyes:     General: No scleral icterus.       Right eye: No discharge.        Left eye: No discharge.     Extraocular Movements: Extraocular movements intact.     Conjunctiva/sclera: Conjunctivae normal.     Pupils: Pupils are equal, round, and reactive to light.  Cardiovascular:     Rate and Rhythm: Normal rate and regular rhythm.  Pulmonary:     Effort: Pulmonary effort is normal. No respiratory distress.     Breath sounds: Normal breath sounds.  Abdominal:     General: Bowel sounds are normal.  Musculoskeletal:     Cervical back: No rigidity or tenderness.  Skin:    General: Skin is warm and dry.       Neurological:     Mental Status: She is alert and oriented to person, place, and time.  Psychiatric:        Mood and Affect: Mood normal.        Behavior: Behavior normal.      No results found for any visits on 08/04/23.    The ASCVD Risk score (Arnett DK, et al., 2019) failed to calculate for the following reasons:   The patient has a prior MI or stroke diagnosis    Assessment & Plan:   Mixed hyperlipidemia -     Lipid panel  Type 2 diabetes mellitus with hyperglycemia, without  long-term current use of insulin (HCC) -     Hemoglobin A1c -     Urinalysis, Routine w reflex microscopic -     Microalbumin / creatinine urine ratio  Healthcare maintenance -     CBC with Differential/Platelet -     Comprehensive metabolic panel  Atypical nevus -  Ambulatory referral to Dermatology    Return in about 6 months (around 02/04/2024).  Encouraged 30 minutes of exercise daily.  Would like to go for mole check.  Continue current medications.  Information given on health maintenance and disease prevention.  Mliss Sax, MD

## 2023-08-05 ENCOUNTER — Ambulatory Visit (INDEPENDENT_AMBULATORY_CARE_PROVIDER_SITE_OTHER): Payer: 59

## 2023-08-05 DIAGNOSIS — E538 Deficiency of other specified B group vitamins: Secondary | ICD-10-CM | POA: Diagnosis not present

## 2023-08-05 LAB — VITAMIN B12: Vitamin B-12: 151 pg/mL — ABNORMAL LOW (ref 211–911)

## 2023-09-02 ENCOUNTER — Other Ambulatory Visit (HOSPITAL_BASED_OUTPATIENT_CLINIC_OR_DEPARTMENT_OTHER): Payer: Self-pay

## 2023-09-12 ENCOUNTER — Encounter: Payer: Self-pay | Admitting: Family Medicine

## 2023-09-13 ENCOUNTER — Other Ambulatory Visit: Payer: Self-pay | Admitting: Internal Medicine

## 2023-09-13 ENCOUNTER — Encounter: Payer: Self-pay | Admitting: Family Medicine

## 2023-09-15 ENCOUNTER — Other Ambulatory Visit (HOSPITAL_COMMUNITY): Payer: Self-pay

## 2023-09-15 MED ORDER — RYBELSUS 14 MG PO TABS
14.0000 mg | ORAL_TABLET | Freq: Every day | ORAL | 1 refills | Status: DC
  Filled 2023-09-15: qty 90, 90d supply, fill #0
  Filled 2023-12-15: qty 90, 90d supply, fill #1

## 2023-09-16 ENCOUNTER — Other Ambulatory Visit (HOSPITAL_COMMUNITY): Payer: Self-pay

## 2023-09-25 ENCOUNTER — Other Ambulatory Visit (HOSPITAL_COMMUNITY): Payer: Self-pay

## 2023-09-30 ENCOUNTER — Other Ambulatory Visit (HOSPITAL_COMMUNITY): Payer: Self-pay

## 2023-09-30 MED ORDER — FLULAVAL 0.5 ML IM SUSY
0.5000 mL | PREFILLED_SYRINGE | Freq: Once | INTRAMUSCULAR | 0 refills | Status: AC
  Filled 2023-09-30: qty 0.5, 1d supply, fill #0

## 2023-10-02 ENCOUNTER — Inpatient Hospital Stay: Payer: 59 | Attending: Hematology and Oncology | Admitting: Hematology and Oncology

## 2023-10-02 ENCOUNTER — Other Ambulatory Visit (HOSPITAL_COMMUNITY): Payer: Self-pay

## 2023-10-02 VITALS — BP 128/65 | HR 91 | Temp 97.8°F | Resp 18 | Ht 67.0 in | Wt 182.2 lb

## 2023-10-02 DIAGNOSIS — Z853 Personal history of malignant neoplasm of breast: Secondary | ICD-10-CM | POA: Diagnosis not present

## 2023-10-02 DIAGNOSIS — Z170 Estrogen receptor positive status [ER+]: Secondary | ICD-10-CM

## 2023-10-02 DIAGNOSIS — C50212 Malignant neoplasm of upper-inner quadrant of left female breast: Secondary | ICD-10-CM

## 2023-10-02 DIAGNOSIS — Z923 Personal history of irradiation: Secondary | ICD-10-CM | POA: Diagnosis not present

## 2023-10-02 DIAGNOSIS — Z9221 Personal history of antineoplastic chemotherapy: Secondary | ICD-10-CM | POA: Insufficient documentation

## 2023-10-02 MED ORDER — TAMOXIFEN CITRATE 20 MG PO TABS
20.0000 mg | ORAL_TABLET | Freq: Every day | ORAL | 1 refills | Status: DC
Start: 2023-10-02 — End: 2024-03-08
  Filled 2023-10-02 – 2023-10-14 (×2): qty 90, 90d supply, fill #0
  Filled 2024-01-14: qty 90, 90d supply, fill #1

## 2023-10-02 NOTE — Assessment & Plan Note (Addendum)
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  Denies any hot flashes or myalgias.   Plan of treatment: 10 years of tamoxifen.  This is completed on May 2025.     Breast cancer surveillance: 1.  Mammogram 06/26/2023 benign, breast density category B   Return to clinic on an as basis

## 2023-10-02 NOTE — Progress Notes (Signed)
Patient Care Team: Mliss Sax, MD as PCP - General (Family Medicine)  DIAGNOSIS:  Encounter Diagnoses  Name Primary?   Malignant neoplasm of upper-inner quadrant of left breast in female, estrogen receptor positive (HCC) Yes   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HCC)     SUMMARY OF ONCOLOGIC HISTORY: Oncology History  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HCC)  10/29/2013 Surgery   left lumpectomy: 0.9 cm invasive ductal carcinoma, grade 2, ER/PR positive HER-2 negative, 1 sentinel node positive for micrometastatic, Oncotype DX recurrence score 29, 17% ROR   12/07/2013 - 02/08/2014 Chemotherapy   Taxotere and Cytoxan 4 adjuvant chemotherapy   03/30/2014 - 05/06/2014 Radiation Therapy   adjuvant radiation therapy   05/20/2014 -  Anti-estrogen oral therapy   tamoxifen 20 mg daily     CHIEF COMPLIANT: Follow-up on tamoxifen therapy  History of Present Illness   The patient, a breast cancer survivor, is nearing the end of a ten-year course of tamoxifen. She reports tolerable hot flashes and occasional breast discomfort, but overall, she is doing well. She has noticed the development of a mole, which her primary care provider did not find concerning. However, she has been considering a dermatology consultation for a full mole check.  In terms of weight management, the patient has lost approximately ten pounds but has plateaued despite being on Rybelsus. She reports that the medication affects her food choices due to occasional nausea and vomiting, leading her to consume more easily digestible foods like cereal, sweets, and fruit. She acknowledges a lack of regular exercise, which she attributes to unfavorable weather conditions and a preference for walking as her primary form of exercise.  The patient also mentions a decrease in breast size, which she initially attributed to tamoxifen but is clarified by the doctor as a result of  prior radiation treatment. She expresses some concern about cognitive changes since her chemotherapy, particularly in her ability to multitask and remember things at work. However, the doctor reassures her that this is likely due to the nature of multitasking rather than a long-term effect of chemotherapy.        ALLERGIES:  is allergic to erythromycin, b-complex-b-12 [b complex vitamins], cefuroxime, vitamin b12, and tape.  MEDICATIONS:  Current Outpatient Medications  Medication Sig Dispense Refill   albuterol (VENTOLIN HFA) 108 (90 Base) MCG/ACT inhaler Inhale 1-2 puffs into the lungs every 6 (six) hours as needed for wheezing or shortness of breath. 6.7 g 1   atorvastatin (LIPITOR) 10 MG tablet Take 1 tablet (10 mg total) by mouth daily. 90 tablet 3   Blood Glucose Monitoring Suppl (FREESTYLE FREEDOM LITE) w/Device KIT Use as advised 1 kit 0   ferrous sulfate 325 (65 FE) MG EC tablet Take 325 mg by mouth 3 (three) times daily with meals.     glucose blood (FREESTYLE LITE) test strip Use as directed twice daily for glucose testing 200 each 3   influenza vac split quadrivalent PF (FLUARIX) 0.5 ML injection Inject into the muscle. 0.5 mL 0   Lancets (FREESTYLE) lancets UAD for bid blood glucose testing Dx: E11.65 100 each 12   metFORMIN (GLUCOPHAGE-XR) 750 MG 24 hr tablet Take 2 tablets (1,500 mg total) by mouth at bedtime. 180 tablet 3   oxybutynin (DITROPAN-XL) 10 MG 24 hr tablet Take 1 tablet (10 mg total) by mouth daily in the mid-morning 90 tablet 4   Semaglutide (RYBELSUS) 14 MG TABS Take 1 tablet (14  mg total) by mouth daily. 90 tablet 1   sertraline (ZOLOFT) 50 MG tablet Take 1 tablet (50 mg total) by mouth daily. 90 tablet 3   vitamin B-12 (CYANOCOBALAMIN) 1000 MCG tablet Take 1 tablet (1,000 mcg total) by mouth daily. 90 tablet 1   tamoxifen (NOLVADEX) 20 MG tablet Take 1 tablet (20 mg total) by mouth daily. 90 tablet 1   No current facility-administered medications for this visit.     PHYSICAL EXAMINATION: ECOG PERFORMANCE STATUS: 1 - Symptomatic but completely ambulatory  Vitals:   10/02/23 1026  BP: 128/65  Pulse: 91  Resp: 18  Temp: 97.8 F (36.6 C)  SpO2: 98%   Filed Weights   10/02/23 1026  Weight: 182 lb 3.2 oz (82.6 kg)     LABORATORY DATA:  I have reviewed the data as listed    Latest Ref Rng & Units 08/04/2023    1:40 PM 09/02/2022   11:35 AM 06/28/2022    1:35 PM  CMP  Glucose 70 - 99 mg/dL 99  161  99   BUN 6 - 23 mg/dL 17  16  16    Creatinine 0.40 - 1.20 mg/dL 0.96  0.45  4.09   Sodium 135 - 145 mEq/L 140  141  140   Potassium 3.5 - 5.1 mEq/L 3.9  3.6  4.3   Chloride 96 - 112 mEq/L 102  103  103   CO2 19 - 32 mEq/L 29  27  28    Calcium 8.4 - 10.5 mg/dL 9.6  9.6  9.8   Total Protein 6.0 - 8.3 g/dL 6.5  7.0  7.2   Total Bilirubin 0.2 - 1.2 mg/dL 0.4  0.9  0.6   Alkaline Phos 39 - 117 U/L 38  32  38   AST 0 - 37 U/L 12  13  17    ALT 0 - 35 U/L 10  11  15      Lab Results  Component Value Date   WBC 8.3 08/04/2023   HGB 12.1 08/04/2023   HCT 37.7 08/04/2023   MCV 83.4 08/04/2023   PLT 210.0 08/04/2023   NEUTROABS 4.5 08/04/2023    ASSESSMENT & PLAN:  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HCC) 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  Denies any hot flashes or myalgias.   Plan of treatment: 10 years of tamoxifen.  This is completed on May 2025.     Breast cancer surveillance: 1.  Mammogram 06/26/2023 benign, breast density category B   Patient has noticed new moles, but primary care physician does not believe they are  concerning. Patient has considered a dermatology consultation for a mole check. -Encourage dermatology consultation for a comprehensive mole check.  Cognitive Function Patient reports some memory issues, potentially related to past chemotherapy. Discussed the possibility of participating in a cognitive stimulation trial using gaming. -Consider patient for cognitive stimulation trial if eligible.  Follow-up Given the patient's excellent progress and health status, plan to graduate her from regular visits, with future appointments on an as-needed basis.     Return to clinic on an as basis I ordered the  PET I did order the PET  No orders of the defined types were placed in this encounter.  The patient has a good understanding of the overall plan. she agrees with it. she will call with any problems that may develop before the next visit here. Total time spent: 30 mins including face to face time and time spent for planning, charting and co-ordination of care   Tamsen Meek, MD 10/02/23

## 2023-10-03 ENCOUNTER — Ambulatory Visit: Payer: 59 | Admitting: Internal Medicine

## 2023-10-14 ENCOUNTER — Encounter: Payer: Self-pay | Admitting: Internal Medicine

## 2023-10-14 ENCOUNTER — Other Ambulatory Visit (HOSPITAL_COMMUNITY): Payer: Self-pay

## 2023-10-14 ENCOUNTER — Ambulatory Visit (INDEPENDENT_AMBULATORY_CARE_PROVIDER_SITE_OTHER): Payer: 59 | Admitting: Internal Medicine

## 2023-10-14 VITALS — BP 120/70 | HR 71 | Ht 67.0 in | Wt 185.0 lb

## 2023-10-14 DIAGNOSIS — Z7984 Long term (current) use of oral hypoglycemic drugs: Secondary | ICD-10-CM | POA: Diagnosis not present

## 2023-10-14 DIAGNOSIS — E663 Overweight: Secondary | ICD-10-CM

## 2023-10-14 DIAGNOSIS — E785 Hyperlipidemia, unspecified: Secondary | ICD-10-CM

## 2023-10-14 DIAGNOSIS — E1165 Type 2 diabetes mellitus with hyperglycemia: Secondary | ICD-10-CM

## 2023-10-14 LAB — POCT GLYCOSYLATED HEMOGLOBIN (HGB A1C): Hemoglobin A1C: 7.2 % — AB (ref 4.0–5.6)

## 2023-10-14 NOTE — Progress Notes (Signed)
Patient ID: Veronica IVENS, female   DOB: Jul 04, 1961, 62 y.o.   MRN: [id number]   HPI: Veronica Rogers is a 62 y.o.-year-old female, presenting presenting for f/u for DM2, dx in 2015 (after steroids), non-insulin-dependent, uncontrolled, without long term complications. Last visit 6 months ago.  Interim history: No increased urination, blurry vision, chest pain.   She still has occasional nausea and vomiting after eating certain foods (hamburger, bacon, tomatoes) - maybe 2-3x a mo.   Reviewed HbA1c levels: Lab Results  Component Value Date   HGBA1C 7.5 (H) 08/04/2023   HGBA1C 6.8 (A) 04/03/2023   HGBA1C 6.9 (A) 12/03/2022  08/01/2022: HbA1c 7.4%  Pt is on a regimen of:  - Metformin ER 1500 mg with dinner - Januvia 100 mg before breakfast >> Rybelsus 7 >> 14 mg before breakfast - Glipizide 5 mg before dinner >> Before a larger meal >> not used since last OV She was on insulin during ChTx. She was on regular metformin >> GI upset (diarrhea). She has frequent urination.  Pt checks her sugars 1-3 times a day: - am:  96, 124 >> 103-162 >> 103-132, 149 >> 115-155, 227 - 2h after b'fast:95-101 >> 120 >> n/c >> 115 >> 159 >> n/c - before lunch: n/c >> 125-130 >> n/c >> 111, 138 >> n/c - 2h after lunch:n/c >> 140-149 >> n/c >> 138 >> n/c >> 194 - before dinner: 122, 140, 186 >> 150  >> n/c >> 145, 167 >> n/c - 2h after dinner: 160s >> n/c >> 189 >> 127 >> n/c - bedtime: n/c  >> 125-135 >> 96, 198 (pizza) >> n/c - nighttime:  114 >> n/c >> 119 >> n/c >> 99 >> 91-125 >> 116-120, 164 Lowest sugar was 96 >> 99>> 91 >> 110;  she has hypoglycemia awareness in the 70s. Highest sugar was 198 >> 167 >> 149 >> 200.  Glucometer: True test >> AccuChek guide >> Freestyle Lite  Pt's meals are: - Breakfast: cereals or 2 eggs or skips >> cereals -- Lunch: leftovers from home: sandwich; soup; meat + veggies; chinese; salad - Dinner: eats out   - No CKD, last BUN/creatinine:  Lab Results  Component  Value Date   BUN 17 08/04/2023   CREATININE 0.66 08/04/2023   Lab Results  Component Value Date   MICRALBCREAT 0.7 08/04/2023   MICRALBCREAT 0.7 05/08/2021   MICRALBCREAT 0.8 10/13/2020   MICRALBCREAT 0.6 09/13/2019   MICRALBCREAT 0.5 11/22/2014   -+HL; last set of lipids: Lab Results  Component Value Date   CHOL 161 08/04/2023   HDL 47.40 08/04/2023   LDLCALC 74 08/04/2023   LDLDIRECT 73 12/28/2021   TRIG 196.0 (H) 08/04/2023   CHOLHDL 3 08/04/2023  On Lipitor 10 mg daily.  - last eye exam was on 06/03/2023: No DR. Dr. Dione Booze.    -She denies numbness and tingling in her feet.  She has a history of plantar fasciitis.  Last foot exam 04/19/2023.  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 On Tamoxifen. She was found to have a low vitamin B12 on 06/28/2022: B12 139 - started on p.o. supplementation: 1000 mcg daily. On 09/02/2022 she was in the emergency room for left hip pain and also nausea.  Of note, lipase was normal at 14.  ROS: + see HPI  I reviewed pt's medications, allergies, PMH, social hx, family hx, and changes were documented in the history of present illness. Otherwise, unchanged from my initial visit note.  Past Medical History:  Diagnosis Date  Arthritis    generalixed   Breast cancer (HCC)    Contact lens/glasses fitting    wears contacts or glasses   Diabetes (HCC) 11/22/2014   type 2- on meds   GERD (gastroesophageal reflux disease)    hx of   Hyperlipidemia    Personal history of chemotherapy    Personal history of radiation therapy    Radiation 03/28/14-05/06/14   Left Breast 60 Gy   Past Surgical History:  Procedure Laterality Date   BREAST BIOPSY Right 12/30/1994   breast, benign   BREAST LUMPECTOMY WITH NEEDLE LOCALIZATION AND AXILLARY SENTINEL LYMPH NODE BX Left 10/29/2013   Procedure: BREAST LUMPECTOMY WITH NEEDLE LOCALIZATION AND AXILLARY SENTINEL LYMPH NODE BX;  Surgeon: Emelia Loron, MD;  Location: [facility];  Service: General;  Laterality:  Left;   COLONOSCOPY  05/31/2011   DJ-F/V movi(good)-F/V-normal   TOTAL KNEE ARTHROPLASTY Right 09/11/2021   Procedure: TOTAL KNEE ARTHROPLASTY;  Surgeon: Durene Romans, MD;  Location: [facility];  Service: Orthopedics;  Laterality: Right;   WISDOM TOOTH EXTRACTION     Social History   Social History   Marital Status: Married    Spouse Name: N/A   Number of Children: 2   Occupational History   [employer] manager - support rep   Social History Main Topics   Smoking status: Never Smoker    Smokeless tobacco: Never Used   Alcohol Use: Yes     Comment: 1-2 drinks a month   Drug Use: No   Current Outpatient Medications on File Prior to Visit  Medication Sig Dispense Refill   albuterol (VENTOLIN HFA) 108 (90 Base) MCG/ACT inhaler Inhale 1-2 puffs into the lungs every 6 (six) hours as needed for wheezing or shortness of breath. 6.7 g 1   atorvastatin (LIPITOR) 10 MG tablet Take 1 tablet (10 mg total) by mouth daily. 90 tablet 3   Blood Glucose Monitoring Suppl (FREESTYLE FREEDOM LITE) w/Device KIT Use as advised 1 kit 0   ferrous sulfate 325 (65 FE) MG EC tablet Take 325 mg by mouth 3 (three) times daily with meals.     glucose blood (FREESTYLE LITE) test strip Use as directed twice daily for glucose testing 200 each 3   influenza vac split quadrivalent PF (FLUARIX) 0.5 ML injection Inject into the muscle. 0.5 mL 0   Lancets (FREESTYLE) lancets UAD for bid blood glucose testing Dx: E11.65 100 each 12   metFORMIN (GLUCOPHAGE-XR) 750 MG 24 hr tablet Take 2 tablets (1,500 mg total) by mouth at bedtime. 180 tablet 3   oxybutynin (DITROPAN-XL) 10 MG 24 hr tablet Take 1 tablet (10 mg total) by mouth daily in the mid-morning 90 tablet 4   Semaglutide (RYBELSUS) 14 MG TABS Take 1 tablet (14 mg total) by mouth daily. 90 tablet 1   sertraline (ZOLOFT) 50 MG tablet Take 1 tablet (50 mg total) by mouth daily. 90 tablet 3   tamoxifen (NOLVADEX) 20 MG tablet Take 1 tablet (20 mg total) by mouth  daily. 90 tablet 1   vitamin B-12 (CYANOCOBALAMIN) 1000 MCG tablet Take 1 tablet (1,000 mcg total) by mouth daily. 90 tablet 1   No current facility-administered medications on file prior to visit.   Allergies  Allergen Reactions   Erythromycin Other (See Comments)    Stomach pain   B-Complex-B-12 [B Complex Vitamins] Itching and Rash    Itchy skin   Cefuroxime Other (See Comments)   Vitamin B12 Other (See Comments)  Tape Rash   Family History  Problem Relation Age of Onset   Hypertension Mother    Squamous cell carcinoma Father    Cancer Father    Diabetes Father    Diabetes Sister    Colon polyps Neg Hx    Colon cancer Neg Hx    Esophageal cancer Neg Hx    Stomach cancer Neg Hx    Rectal cancer Neg Hx    PE: BP 120/70   Pulse 71   Ht 5\' 7"  (1.702 m)   Wt 185 lb (83.9 kg)   LMP 11/08/2013   SpO2 98%   BMI 28.98 kg/m   Wt Readings from Last 10 Encounters:  10/14/23 185 lb (83.9 kg)  10/02/23 182 lb 3.2 oz (82.6 kg)  08/04/23 186 lb 3.2 oz (84.5 kg)  04/03/23 183 lb 9.6 oz (83.3 kg)  12/03/22 182 lb (82.6 kg)  10/01/22 184 lb 9.6 oz (83.7 kg)  09/12/22 189 lb (85.7 kg)  08/13/22 189 lb (85.7 kg)  08/01/22 189 lb 12.8 oz (86.1 kg)  06/28/22 189 lb 12.8 oz (86.1 kg)   Constitutional: overweight, in NAD Eyes:  EOMI, no exophthalmos ENT: no thyromegaly, no cervical lymphadenopathy Cardiovascular: RRR, No MRG Respiratory: CTA B Musculoskeletal: no deformities Skin: no rashes Neurological: no tremor with outstretched hands  ASSESSMENT: 1. DM2, non-insulin-dependent, uncontrolled, without long term complications, but with hyperglycemia  No FH of MTC or personal hx of pancreatitis.  2. HL  3.  Overweight  PLAN:  1. Patient with longstanding, uncontrolled, type 2 diabetes on oral antidiabetic regimen with metformin, sulfonylurea, and oral GLP-1 receptor agonist, with better control at last visit, HbA1c 6.8% but increased to 7.5% 2 months ago. -At last  visit, sugars are mostly at goal with only very mild hyperglycemic exceptions in the morning.  She had occasional regurgitation with Rybelsus after eating large meals.  We discussed about reducing the size of the meals but otherwise we did not change the regimen.  She was forgetting metformin and we discussed about moving it on the nightstand or in the bathroom to be more easily available. - at today's visit, sugars are at or slightly higher than goal in the morning (with one value in the 200s) but they are better later in the day.  She is not using glipizide before a larger meal and I strongly advised her to do so especially the holidays coming up.  Otherwise, I advised her to continue the same regimen and move the metformin from bedtime to dinnertime. - I suggested to:  Patient Instructions  Please continue:  - Metformin ER 1500 mg but move this with dinner - Rybelsus 14 mg before b'fast  Add: - Glipizide 5 mg - before a larger meal  Please return in 4-6 months with your sugar log.  - we checked her HbA1c: 7.2% (lower) - advised to check sugars at different times of the day - 1x a day, rotating check times - advised for yearly eye exams >> she is UTD - return to clinic in 4-6 months   2. HL -Reviewed latest lipid panel from 07/2023: Fractions at goal, except high triglycerides, but these were improved from 700s in the past Lab Results  Component Value Date   CHOL 161 08/04/2023   HDL 47.40 08/04/2023   LDLCALC 74 08/04/2023   LDLDIRECT 73 12/28/2021   TRIG 196.0 (H) 08/04/2023   CHOLHDL 3 08/04/2023  -She continues Lipitor 10 g daily without side effects  3.  Overweight -Will continue Rybelsus, which is also helping with weight loss -She gained 1 pound before last visit, previously lost 7 -She gained 2 pounds since last visit  Carlus Pavlov, MD PhD [facility] Endocrinology

## 2023-10-14 NOTE — Addendum Note (Signed)
Addended by: Pollie Meyer on: 10/14/2023 04:00 PM   Modules accepted: Orders

## 2023-10-14 NOTE — Patient Instructions (Addendum)
Please continue:  - Metformin ER 1500 mg but move this with dinner - Rybelsus 14 mg before b'fast  Add: - Glipizide 5 mg - before a larger meal  Please return in 4-6 months with your sugar log.

## 2023-11-13 ENCOUNTER — Other Ambulatory Visit: Payer: Self-pay

## 2023-11-17 ENCOUNTER — Ambulatory Visit: Payer: 59 | Admitting: Family Medicine

## 2023-12-01 ENCOUNTER — Other Ambulatory Visit: Payer: Self-pay | Admitting: Family Medicine

## 2023-12-01 ENCOUNTER — Other Ambulatory Visit: Payer: Self-pay

## 2023-12-01 ENCOUNTER — Other Ambulatory Visit (HOSPITAL_COMMUNITY): Payer: Self-pay

## 2023-12-01 MED ORDER — VITAMIN B-12 1000 MCG PO TABS
1000.0000 ug | ORAL_TABLET | Freq: Every day | ORAL | 1 refills | Status: DC
  Filled 2023-12-01 – 2023-12-02 (×2): qty 100, 100d supply, fill #0
  Filled 2023-12-15 – 2024-03-26 (×2): qty 100, 100d supply, fill #1

## 2023-12-02 ENCOUNTER — Other Ambulatory Visit: Payer: Self-pay

## 2023-12-02 ENCOUNTER — Other Ambulatory Visit (HOSPITAL_COMMUNITY): Payer: Self-pay

## 2023-12-15 ENCOUNTER — Other Ambulatory Visit: Payer: Self-pay | Admitting: Family Medicine

## 2023-12-15 DIAGNOSIS — E782 Mixed hyperlipidemia: Secondary | ICD-10-CM

## 2023-12-16 ENCOUNTER — Other Ambulatory Visit (HOSPITAL_COMMUNITY): Payer: Self-pay

## 2023-12-16 ENCOUNTER — Other Ambulatory Visit: Payer: Self-pay

## 2023-12-16 MED ORDER — ATORVASTATIN CALCIUM 10 MG PO TABS
10.0000 mg | ORAL_TABLET | Freq: Every day | ORAL | 1 refills | Status: DC
Start: 2023-12-16 — End: 2024-02-17
  Filled 2023-12-16: qty 90, 90d supply, fill #0

## 2023-12-16 MED ORDER — AMOXICILLIN 500 MG PO CAPS
2000.0000 mg | ORAL_CAPSULE | ORAL | 1 refills | Status: DC
  Filled 2023-12-16: qty 16, 4d supply, fill #0

## 2023-12-17 ENCOUNTER — Other Ambulatory Visit: Payer: Self-pay

## 2023-12-29 ENCOUNTER — Other Ambulatory Visit (HOSPITAL_COMMUNITY): Payer: Self-pay

## 2024-01-14 ENCOUNTER — Other Ambulatory Visit (HOSPITAL_COMMUNITY): Payer: Self-pay

## 2024-01-29 ENCOUNTER — Other Ambulatory Visit (HOSPITAL_COMMUNITY): Payer: Self-pay

## 2024-01-29 ENCOUNTER — Encounter: Payer: Self-pay | Admitting: Family Medicine

## 2024-01-29 ENCOUNTER — Ambulatory Visit: Payer: Commercial Managed Care - PPO | Admitting: Family Medicine

## 2024-01-29 VITALS — BP 138/70 | HR 75 | Temp 97.3°F | Ht 67.0 in | Wt 187.0 lb

## 2024-01-29 DIAGNOSIS — S46911A Strain of unspecified muscle, fascia and tendon at shoulder and upper arm level, right arm, initial encounter: Secondary | ICD-10-CM | POA: Insufficient documentation

## 2024-01-29 MED ORDER — MELOXICAM 7.5 MG PO TABS
7.5000 mg | ORAL_TABLET | Freq: Every day | ORAL | 0 refills | Status: DC
  Filled 2024-01-29: qty 30, 30d supply, fill #0

## 2024-01-29 NOTE — Progress Notes (Signed)
Established Patient Office Visit   Subjective:  Patient ID: Veronica Rogers, female    DOB: [birth year]  Age: 63 y.o. MRN: [id number]  Chief Complaint  Patient presents with   Genia Hotter hitting head, complains of headache at times, right shoulder pain that radiates to elbow, right knee and thigh pain    Fall Pertinent negatives include no abdominal pain, loss of consciousness or tingling.   Encounter Diagnoses  Name Primary?   Strain of right shoulder, initial encounter Yes   Patient tripped on a cable and fell approximately 2 weeks ago.  She hit her head and landed on her right shoulder.  She had experienced headache that is resolving.  She had some ongoing right posterior shoulder pain that has been persisting up till yesterday.  She has been experiencing a throbbing pain in her right arm.  There has been no numbness or tingling.  There has been no weakness.  There has been no nausea or vomiting.   Review of Systems  Constitutional: Negative.   HENT: Negative.    Eyes:  Negative for blurred vision, discharge and redness.  Respiratory: Negative.    Cardiovascular: Negative.   Gastrointestinal:  Negative for abdominal pain.  Genitourinary: Negative.   Musculoskeletal:  Positive for joint pain. Negative for myalgias.  Skin:  Negative for rash.  Neurological:  Negative for tingling, loss of consciousness and weakness.  Endo/Heme/Allergies:  Negative for polydipsia.     Current Outpatient Medications:    atorvastatin (LIPITOR) 10 MG tablet, Take 1 tablet (10 mg total) by mouth daily., Disp: 90 tablet, Rfl: 1   Blood Glucose Monitoring Suppl (FREESTYLE FREEDOM LITE) w/Device KIT, Use as advised, Disp: 1 kit, Rfl: 0   cyanocobalamin (VITAMIN B12) 1000 MCG tablet, Take 1 tablet (1,000 mcg total) by mouth daily., Disp: 100 tablet, Rfl: 1   glucose blood (FREESTYLE LITE) test strip, Use as directed twice daily for glucose testing, Disp: 200 each, Rfl: 3   Lancets (FREESTYLE)  lancets, UAD for bid blood glucose testing Dx: E11.65, Disp: 100 each, Rfl: 12   meloxicam (MOBIC) 7.5 MG tablet, Take 1 tablet (7.5 mg total) by mouth daily., Disp: 30 tablet, Rfl: 0   metFORMIN (GLUCOPHAGE-XR) 750 MG 24 hr tablet, Take 2 tablets (1,500 mg total) by mouth at bedtime., Disp: 180 tablet, Rfl: 3   Multiple Vitamin (MULTIVITAMIN PO), Take by mouth daily., Disp: , Rfl:    oxybutynin (DITROPAN-XL) 10 MG 24 hr tablet, Take 1 tablet (10 mg total) by mouth daily in the mid-morning, Disp: 90 tablet, Rfl: 4   Semaglutide (RYBELSUS) 14 MG TABS, Take 1 tablet (14 mg total) by mouth daily., Disp: 90 tablet, Rfl: 1   sertraline (ZOLOFT) 50 MG tablet, Take 1 tablet (50 mg total) by mouth daily., Disp: 90 tablet, Rfl: 3   tamoxifen (NOLVADEX) 20 MG tablet, Take 1 tablet (20 mg total) by mouth daily., Disp: 90 tablet, Rfl: 1   albuterol (VENTOLIN HFA) 108 (90 Base) MCG/ACT inhaler, Inhale 1-2 puffs into the lungs every 6 (six) hours as needed for wheezing or shortness of breath. (Patient not taking: Reported on 01/29/2024), Disp: 6.7 g, Rfl: 1   amoxicillin (AMOXIL) 500 MG capsule, Take 4 capsules (2,000 mg total) by mouth 1 hour prior to dental appointment (Patient not taking: Reported on 01/29/2024), Disp: 16 capsule, Rfl: 1   ferrous sulfate 325 (65 FE) MG EC tablet, Take 325 mg by mouth 3 (three) times daily with meals. (Patient  not taking: Reported on 01/29/2024), Disp: , Rfl:    influenza vac split quadrivalent PF (FLUARIX) 0.5 ML injection, Inject into the muscle. (Patient not taking: Reported on 01/29/2024), Disp: 0.5 mL, Rfl: 0   Objective:     BP 138/70 (BP Location: Left Arm, Patient Position: Sitting, Cuff Size: Normal)   Pulse 75   Temp (!) 97.3 F (36.3 C)   Ht 5\' 7"  (1.702 m)   Wt 187 lb (84.8 kg)   LMP 11/08/2013   SpO2 98%   BMI 29.29 kg/m    Physical Exam Constitutional:      General: She is not in acute distress.    Appearance: Normal appearance. She is not  ill-appearing, toxic-appearing or diaphoretic.  HENT:     Head: Normocephalic and atraumatic.     Right Ear: External ear normal.     Left Ear: External ear normal.  Eyes:     General: No scleral icterus.       Right eye: No discharge.        Left eye: No discharge.     Extraocular Movements: Extraocular movements intact.     Conjunctiva/sclera: Conjunctivae normal.  Pulmonary:     Effort: Pulmonary effort is normal. No respiratory distress.  Musculoskeletal:     Right shoulder: Tenderness present. No bony tenderness. Normal range of motion. Normal strength.       Arms:     Cervical back: No rigidity, tenderness or bony tenderness. No pain with movement. Normal range of motion.       Back:  Skin:    General: Skin is warm and dry.  Neurological:     Mental Status: She is alert and oriented to person, place, and time.  Psychiatric:        Mood and Affect: Mood normal.        Behavior: Behavior normal.      No results found for any visits on 01/29/24.    The ASCVD Risk score (Arnett DK, et al., 2019) failed to calculate for the following reasons:   Risk score cannot be calculated because patient has a medical history suggesting prior/existing ASCVD    Assessment & Plan:   Strain of right shoulder, initial encounter -     Meloxicam; Take 1 tablet (7.5 mg total) by mouth daily.  Dispense: 30 tablet; Refill: 0    Return Has follow-up scheduled for hyperlipidemia next month, for annual physical.    Mliss Sax, MD

## 2024-02-06 ENCOUNTER — Ambulatory Visit: Payer: 59 | Admitting: Family Medicine

## 2024-02-17 ENCOUNTER — Ambulatory Visit: Payer: Commercial Managed Care - PPO | Admitting: Family Medicine

## 2024-02-17 ENCOUNTER — Other Ambulatory Visit (HOSPITAL_COMMUNITY): Payer: Self-pay

## 2024-02-17 ENCOUNTER — Encounter: Payer: Self-pay | Admitting: Family Medicine

## 2024-02-17 DIAGNOSIS — E782 Mixed hyperlipidemia: Secondary | ICD-10-CM | POA: Diagnosis not present

## 2024-02-17 LAB — LIPID PANEL
Cholesterol: 162 mg/dL (ref 0–200)
HDL: 45.3 mg/dL (ref >39.00)
LDL Cholesterol: 57 mg/dL (ref 0–99)
NonHDL: 116.59
Total CHOL/HDL Ratio: 4
Triglycerides: 300 mg/dL — ABNORMAL HIGH (ref 0.0–149.0)
VLDL: 60 mg/dL — ABNORMAL HIGH (ref 0.0–40.0)

## 2024-02-17 MED ORDER — ATORVASTATIN CALCIUM 10 MG PO TABS
10.0000 mg | ORAL_TABLET | Freq: Every day | ORAL | 3 refills | Status: DC
  Filled 2024-02-17 – 2024-03-08 (×2): qty 90, 90d supply, fill #0
  Filled 2024-06-02: qty 90, 90d supply, fill #1

## 2024-02-17 NOTE — Progress Notes (Signed)
Established Patient Office Visit   Subjective:  Patient ID: Veronica Rogers, female    DOB: 08-26-1961  Age: 63 y.o. MRN: [id number]  Chief Complaint  Patient presents with   Hyperlipidemia    Follow up. Abnormal lipid panels. Pt has fasted since 8 am this morning.     Hyperlipidemia Pertinent negatives include no myalgias.   Encounter Diagnoses  Name Primary?   Mixed hyperlipidemia    Follow-up of above.  Taking atorvastatin regularly.  Fasting for 6 hours today.   Review of Systems  Constitutional: Negative.   HENT: Negative.    Eyes:  Negative for blurred vision, discharge and redness.  Respiratory: Negative.    Cardiovascular: Negative.   Gastrointestinal:  Negative for abdominal pain.  Genitourinary: Negative.   Musculoskeletal: Negative.  Negative for myalgias.  Skin:  Negative for rash.  Neurological:  Negative for tingling, loss of consciousness and weakness.  Endo/Heme/Allergies:  Negative for polydipsia.     Current Outpatient Medications:    Blood Glucose Monitoring Suppl (FREESTYLE FREEDOM LITE) w/Device KIT, Use as advised, Disp: 1 kit, Rfl: 0   cyanocobalamin (VITAMIN B12) 1000 MCG tablet, Take 1 tablet (1,000 mcg total) by mouth daily., Disp: 100 tablet, Rfl: 1   glucose blood (FREESTYLE LITE) test strip, Use as directed twice daily for glucose testing, Disp: 200 each, Rfl: 3   Lancets (FREESTYLE) lancets, UAD for bid blood glucose testing Dx: E11.65, Disp: 100 each, Rfl: 12   meloxicam (MOBIC) 7.5 MG tablet, Take 1 tablet (7.5 mg total) by mouth daily., Disp: 30 tablet, Rfl: 0   metFORMIN (GLUCOPHAGE-XR) 750 MG 24 hr tablet, Take 2 tablets (1,500 mg total) by mouth at bedtime., Disp: 180 tablet, Rfl: 3   Multiple Vitamin (MULTIVITAMIN PO), Take by mouth daily., Disp: , Rfl:    oxybutynin (DITROPAN-XL) 10 MG 24 hr tablet, Take 1 tablet (10 mg total) by mouth daily in the mid-morning, Disp: 90 tablet, Rfl: 4   Semaglutide (RYBELSUS) 14 MG TABS, Take 1  tablet (14 mg total) by mouth daily., Disp: 90 tablet, Rfl: 1   sertraline (ZOLOFT) 50 MG tablet, Take 1 tablet (50 mg total) by mouth daily., Disp: 90 tablet, Rfl: 3   tamoxifen (NOLVADEX) 20 MG tablet, Take 1 tablet (20 mg total) by mouth daily., Disp: 90 tablet, Rfl: 1   albuterol (VENTOLIN HFA) 108 (90 Base) MCG/ACT inhaler, Inhale 1-2 puffs into the lungs every 6 (six) hours as needed for wheezing or shortness of breath. (Patient not taking: Reported on 02/17/2024), Disp: 6.7 g, Rfl: 1   amoxicillin (AMOXIL) 500 MG capsule, Take 4 capsules (2,000 mg total) by mouth 1 hour prior to dental appointment (Patient not taking: Reported on 02/17/2024), Disp: 16 capsule, Rfl: 1   atorvastatin (LIPITOR) 10 MG tablet, Take 1 tablet (10 mg total) by mouth daily., Disp: 90 tablet, Rfl: 3   ferrous sulfate 325 (65 FE) MG EC tablet, Take 325 mg by mouth 3 (three) times daily with meals. (Patient not taking: Reported on 02/17/2024), Disp: , Rfl:    influenza vac split quadrivalent PF (FLUARIX) 0.5 ML injection, Inject into the muscle. (Patient not taking: Reported on 02/17/2024), Disp: 0.5 mL, Rfl: 0   Objective:     BP 118/70   Pulse 79   Temp 98 F (36.7 C)   Ht 5\' 7"  (1.702 m)   Wt 188 lb 3.2 oz (85.4 kg)   LMP 11/08/2013   SpO2 96%   BMI 29.48 kg/m  BP  Readings from Last 3 Encounters:  02/17/24 118/70  01/29/24 138/70  10/14/23 120/70   Wt Readings from Last 3 Encounters:  02/17/24 188 lb 3.2 oz (85.4 kg)  01/29/24 187 lb (84.8 kg)  10/14/23 185 lb (83.9 kg)      Physical Exam Constitutional:      General: She is not in acute distress.    Appearance: Normal appearance. She is not ill-appearing, toxic-appearing or diaphoretic.  HENT:     Head: Normocephalic and atraumatic.     Right Ear: External ear normal.     Left Ear: External ear normal.  Eyes:     General: No scleral icterus.       Right eye: No discharge.        Left eye: No discharge.     Extraocular Movements: Extraocular  movements intact.     Conjunctiva/sclera: Conjunctivae normal.  Pulmonary:     Effort: Pulmonary effort is normal. No respiratory distress.  Skin:    General: Skin is warm and dry.  Neurological:     Mental Status: She is alert and oriented to person, place, and time.  Psychiatric:        Mood and Affect: Mood normal.        Behavior: Behavior normal.      No results found for any visits on 02/17/24.    The ASCVD Risk score (Arnett DK, et al., 2019) failed to calculate for the following reasons:   Risk score cannot be calculated because patient has a medical history suggesting prior/existing ASCVD    Assessment & Plan:   Mixed hyperlipidemia -     Atorvastatin Calcium; Take 1 tablet (10 mg total) by mouth daily.  Dispense: 90 tablet; Refill: 3 -     Lipid panel    Return Has physical scheduled in August..  Continue atorvastatin 10 mg.  Information given on preventing high cholesterol.  Mole check per dermatology at the end of the month.  Appointment with GYN next month.  Continue seeing endocrinology for diabetes.  Mliss Sax, MD

## 2024-02-20 ENCOUNTER — Other Ambulatory Visit (HOSPITAL_COMMUNITY): Payer: Self-pay

## 2024-02-24 ENCOUNTER — Encounter: Payer: Self-pay | Admitting: Dermatology

## 2024-02-24 ENCOUNTER — Ambulatory Visit: Payer: Commercial Managed Care - PPO | Admitting: Dermatology

## 2024-02-24 VITALS — BP 125/68 | HR 76

## 2024-02-24 DIAGNOSIS — L821 Other seborrheic keratosis: Secondary | ICD-10-CM

## 2024-02-24 DIAGNOSIS — D485 Neoplasm of uncertain behavior of skin: Secondary | ICD-10-CM

## 2024-02-24 DIAGNOSIS — L814 Other melanin hyperpigmentation: Secondary | ICD-10-CM | POA: Diagnosis not present

## 2024-02-24 DIAGNOSIS — W908XXA Exposure to other nonionizing radiation, initial encounter: Secondary | ICD-10-CM | POA: Diagnosis not present

## 2024-02-24 DIAGNOSIS — Z1283 Encounter for screening for malignant neoplasm of skin: Secondary | ICD-10-CM

## 2024-02-24 DIAGNOSIS — R2232 Localized swelling, mass and lump, left upper limb: Secondary | ICD-10-CM

## 2024-02-24 DIAGNOSIS — L818 Other specified disorders of pigmentation: Secondary | ICD-10-CM | POA: Diagnosis not present

## 2024-02-24 DIAGNOSIS — D229 Melanocytic nevi, unspecified: Secondary | ICD-10-CM

## 2024-02-24 DIAGNOSIS — L578 Other skin changes due to chronic exposure to nonionizing radiation: Secondary | ICD-10-CM | POA: Diagnosis not present

## 2024-02-24 DIAGNOSIS — D0372 Melanoma in situ of left lower limb, including hip: Secondary | ICD-10-CM

## 2024-02-24 DIAGNOSIS — D039 Melanoma in situ, unspecified: Secondary | ICD-10-CM

## 2024-02-24 DIAGNOSIS — D1801 Hemangioma of skin and subcutaneous tissue: Secondary | ICD-10-CM | POA: Diagnosis not present

## 2024-02-24 DIAGNOSIS — R229 Localized swelling, mass and lump, unspecified: Secondary | ICD-10-CM

## 2024-02-24 DIAGNOSIS — D492 Neoplasm of unspecified behavior of bone, soft tissue, and skin: Secondary | ICD-10-CM

## 2024-02-24 HISTORY — DX: Melanoma in situ, unspecified: D03.9

## 2024-02-24 NOTE — Patient Instructions (Signed)
 Important Information  Due to recent changes in healthcare laws, you may see results of your pathology and/or laboratory studies on MyChart before the doctors have had a chance to review them. We understand that in some cases there may be results that are confusing or concerning to you. Please understand that not all results are received at the same time and often the doctors may need to interpret multiple results in order to provide you with the best plan of care or course of treatment. Therefore, we ask that you please give Veronica Rogers 2 business days to thoroughly review all your results before contacting the office for clarification. Should we see a critical lab result, you will be contacted sooner.   If You Need Anything After Your Visit  If you have any questions or concerns for your doctor, please call our main line at [phone] If no one answers, please leave a voicemail as directed and we will return your call as soon as possible. Messages left after 4 pm will be answered the following business day.   You may also send Veronica Rogers a message via MyChart. We typically respond to MyChart messages within 1-2 business days.  For prescription refills, please ask your pharmacy to contact our office. Our fax number is [phone].  If you have an urgent issue when the clinic is closed that cannot wait until the next business day, you can page your doctor at the number below.    Please note that while we do our best to be available for urgent issues outside of office hours, we are not available 24/7.   If you have an urgent issue and are unable to reach Veronica Rogers, you may choose to seek medical care at your doctor's office, retail clinic, urgent care center, or emergency room.  If you have a medical emergency, please immediately call 911 or go to the emergency department. In the event of inclement weather, please call our main line at [phone] for an update on the status of any delays or  closures.  Dermatology Medication Tips: Please keep the boxes that topical medications come in in order to help keep track of the instructions about where and how to use these. Pharmacies typically print the medication instructions only on the boxes and not directly on the medication tubes.   If your medication is too expensive, please contact our office at [phone] or send Veronica Rogers a message through MyChart.   We are unable to tell what your co-pay for medications will be in advance as this is different depending on your insurance coverage. However, we may be able to find a substitute medication at lower cost or fill out paperwork to get insurance to cover a needed medication.   If a prior authorization is required to get your medication covered by your insurance company, please allow Veronica Rogers 1-2 business days to complete this process.  Drug prices often vary depending on where the prescription is filled and some pharmacies may offer cheaper prices.  The website www.goodrx.com contains coupons for medications through different pharmacies. The prices here do not account for what the cost may be with help from insurance (it may be cheaper with your insurance), but the website can give you the price if you did not use any insurance.  - You can print the associated coupon and take it with your prescription to the pharmacy.  - You may also stop by our office during regular business hours and pick up a GoodRx coupon card.  - If  you need your prescription sent electronically to a different pharmacy, notify our office through [facility] or by phone at [phone]    Skin Education :   I counseled the patient regarding the following: Sun screen (SPF 30 or greater) should be applied during peak UV exposure (between 10am and 2pm) and reapplied after exercise or swimming.  The ABCDEs of melanoma were reviewed with the patient, and the importance of monthly self-examination of moles was emphasized.  Should any moles change in shape or color, or itch, bleed or burn, pt will contact our office for evaluation sooner then their interval appointment.  Plan: Sunscreen Recommendations I recommended a broad spectrum sunscreen with a SPF of 30 or higher. I explained that SPF 30 sunscreens block approximately 97 percent of the sun's harmful rays. Sunscreens should be applied at least 15 minutes prior to expected sun exposure and then every 2 hours after that as long as sun exposure continues. If swimming or exercising sunscreen should be reapplied every 45 minutes to an hour after getting wet or sweating. One ounce, or the equivalent of a shot glass full of sunscreen, is adequate to protect the skin not covered by a bathing suit. I also recommended a lip balm with a sunscreen as well. Sun protective clothing can be used in lieu of sunscreen but must be worn the entire time you are exposed to the sun's rays.  Patient Handout: Wound Care for Skin Biopsy Site  Taking Care of Your Skin Biopsy Site  Proper care of the biopsy site is essential for promoting healing and minimizing scarring. This handout provides instructions on how to care for your biopsy site to ensure optimal recovery.  1. Cleaning the Wound:  Clean the biopsy site daily with gentle soap and water. Gently pat the area dry with a clean, soft towel. Avoid harsh scrubbing or rubbing the area, as this can irritate the skin and delay healing.  2. Applying Aquaphor and Bandage:  After cleaning the wound, apply a thin layer of Aquaphor ointment to the biopsy site. Cover the area with a sterile bandage to protect it from dirt, bacteria, and friction. Change the bandage daily or as needed if it becomes soiled or wet.  3. Continued Care for One Week:  Repeat the cleaning, Aquaphor application, and bandaging process daily for one week following the biopsy procedure. Keeping the wound clean and moist during this initial healing period will help  prevent infection and promote optimal healing.  4. Massaging Aquaphor into the Area:  ---After one week, discontinue the use of bandages but continue to apply Aquaphor to the biopsy site. ----Gently massage the Aquaphor into the area using circular motions. ---Massaging the skin helps to promote circulation and prevent the formation of scar tissue.   Additional Tips:  Avoid exposing the biopsy site to direct sunlight during the healing process, as this can cause hyperpigmentation or worsen scarring. If you experience any signs of infection, such as increased redness, swelling, warmth, or drainage from the wound, contact your healthcare provider immediately. Follow any additional instructions provided by your healthcare provider for caring for the biopsy site and managing any discomfort. Conclusion:  Taking proper care of your skin biopsy site is crucial for ensuring optimal healing and minimizing scarring. By following these instructions for cleaning, applying Aquaphor, and massaging the area, you can promote a smooth and successful recovery. If you have any questions or concerns about caring for your biopsy site, don't hesitate to contact your healthcare  provider for guidance.

## 2024-02-24 NOTE — Progress Notes (Signed)
   New Patient Visit   Subjective  Veronica Rogers is a 63 y.o. female who presents for the following: Skin Cancer Screening and Full Body Skin Exam. No hx or family hx of skin cancer.  The patient presents for Total-Body Skin Exam (TBSE) for skin cancer screening and mole check. The patient has spots, moles and lesions to be evaluated, some may be new or changing.  The following portions of the chart were reviewed this encounter and updated as appropriate: medications, allergies, medical history  Review of Systems:  No other skin or systemic complaints except as noted in HPI or Assessment and Plan.  Objective  Well appearing patient in no apparent distress; mood and affect are within normal limits.  A full examination was performed including scalp, head, eyes, ears, nose, lips, neck, chest, axillae, abdomen, back, buttocks, bilateral upper extremities, bilateral lower extremities, hands, feet, fingers, toes, fingernails, and toenails. All findings within normal limits unless otherwise noted below.   Relevant physical exam findings are noted in the Assessment and Plan.  Left Thigh - Posterior 1.0 brown black papule   Assessment & Plan   SKIN CANCER SCREENING PERFORMED TODAY.  ACTINIC DAMAGE - Chronic condition, secondary to cumulative UV/sun exposure - diffuse scaly erythematous macules with underlying dyspigmentation - Recommend daily broad spectrum sunscreen SPF 30+ to sun-exposed areas, reapply every 2 hours as needed.  - Staying in the shade or wearing long sleeves, sun glasses (UVA+UVB protection) and wide brim hats (4-inch brim around the entire circumference of the hat) are also recommended for sun protection.  - Call for new or changing lesions.  LENTIGINES, SEBORRHEIC KERATOSES, HEMANGIOMAS - Benign normal skin lesions - Benign-appearing - Call for any changes  MELANOCYTIC NEVI - Tan-brown and/or pink-flesh-colored symmetric macules and papules - Benign appearing on  exam today - Observation - Call clinic for new or changing moles - Recommend daily use of broad spectrum spf 30+ sunscreen to sun-exposed areas.   Neoplasm of Skin- Left breast- Likely EIC vs lipoma - Recommend monitoring  Radiation Tattoos- Left breast - Hx of breast cancer NEOPLASM OF UNCERTAIN BEHAVIOR OF SKIN Left Thigh - Posterior Skin / nail biopsy Type of biopsy: tangential   Informed consent: discussed and consent obtained   Timeout: patient name, date of birth, surgical site, and procedure verified   Procedure prep:  Patient was prepped and draped in usual sterile fashion Prep type:  Isopropyl alcohol Anesthesia: the lesion was anesthetized in a standard fashion   Anesthetic:  1% lidocaine w/ epinephrine 1-100,000 buffered w/ 8.4% NaHCO3 Instrument used: DermaBlade   Hemostasis achieved with: pressure and aluminum chloride   Outcome: patient tolerated procedure well   Post-procedure details: sterile dressing applied and wound care instructions given   Dressing type: bandage and petrolatum   Specimen 1 - Surgical pathology Differential Diagnosis: r/o MM vs DN  Check Margins: No SUBCUTANEOUS NODULE Left Axilla IATROGENIC TATTOO   MULTIPLE BENIGN NEVI   LENTIGINES   SEBORRHEIC KERATOSES   CHERRY ANGIOMA   ACTINIC SKIN DAMAGE    Return if symptoms worsen or fail to improve; based on biopsy results.  Dominga Ferry, Surg Tech III, am acting as scribe for Gwenith Daily, MD.   Documentation: I have reviewed the above documentation for accuracy and completeness, and I agree with the above.  Gwenith Daily, MD

## 2024-02-25 DIAGNOSIS — Z124 Encounter for screening for malignant neoplasm of cervix: Secondary | ICD-10-CM | POA: Diagnosis not present

## 2024-02-25 DIAGNOSIS — Z01419 Encounter for gynecological examination (general) (routine) without abnormal findings: Secondary | ICD-10-CM | POA: Diagnosis not present

## 2024-02-26 LAB — SURGICAL PATHOLOGY

## 2024-03-02 LAB — HM PAP SMEAR: HPV, high-risk: NEGATIVE

## 2024-03-04 ENCOUNTER — Encounter: Payer: Self-pay | Admitting: Dermatology

## 2024-03-08 ENCOUNTER — Other Ambulatory Visit (HOSPITAL_COMMUNITY): Payer: Self-pay

## 2024-03-08 ENCOUNTER — Encounter: Payer: Self-pay | Admitting: Dermatology

## 2024-03-08 ENCOUNTER — Other Ambulatory Visit: Payer: Self-pay | Admitting: Hematology and Oncology

## 2024-03-08 ENCOUNTER — Ambulatory Visit (INDEPENDENT_AMBULATORY_CARE_PROVIDER_SITE_OTHER): Admitting: Dermatology

## 2024-03-08 VITALS — BP 131/73 | HR 86 | Temp 98.8°F

## 2024-03-08 DIAGNOSIS — C439 Malignant melanoma of skin, unspecified: Secondary | ICD-10-CM

## 2024-03-08 DIAGNOSIS — C50212 Malignant neoplasm of upper-inner quadrant of left female breast: Secondary | ICD-10-CM

## 2024-03-08 DIAGNOSIS — D0372 Melanoma in situ of left lower limb, including hip: Secondary | ICD-10-CM | POA: Diagnosis not present

## 2024-03-08 DIAGNOSIS — L579 Skin changes due to chronic exposure to nonionizing radiation, unspecified: Secondary | ICD-10-CM

## 2024-03-08 DIAGNOSIS — L814 Other melanin hyperpigmentation: Secondary | ICD-10-CM

## 2024-03-08 MED ORDER — TAMOXIFEN CITRATE 20 MG PO TABS
20.0000 mg | ORAL_TABLET | Freq: Every day | ORAL | 0 refills | Status: DC
  Filled 2024-03-08 – 2024-03-10 (×2): qty 90, 90d supply, fill #0

## 2024-03-08 MED ORDER — TRAMADOL HCL 50 MG PO TABS
50.0000 mg | ORAL_TABLET | Freq: Four times a day (QID) | ORAL | 0 refills | Status: DC | PRN
Start: 2024-03-08 — End: 2024-07-12
  Filled 2024-03-08: qty 8, 2d supply, fill #0

## 2024-03-08 NOTE — Patient Instructions (Signed)

## 2024-03-08 NOTE — Progress Notes (Signed)
 Follow-Up Visit   Subjective  Veronica Rogers is a 63 y.o. 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 Caralyn Guile. She is accompanied by her daughter.  The following portions of the chart were reviewed this encounter and updated as appropriate: medications, allergies, medical history  Review of Systems:  No other skin or systemic complaints except as noted in HPI or Assessment and Plan.  Objective  Well appearing patient in no apparent distress; mood and affect are within normal limits.  A focused examination was performed of the following areas: Left thigh,posterior Relevant physical exam findings are noted in the Assessment and Plan.     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 No   Was the anticoagulation regimen changed prior to Mohs? No    Procedure Details: Timeout: pre-procedure verification complete Procedure Prep: patient was prepped and draped in usual sterile fashion Prep type: chlorhexidine Biopsy accession number: [id number] Biopsy lab: [facility] Frozen section biopsy performed: No   Specimen debulked: Yes   Pre-Op diagnosis: melanoma Melanoma subtype: in situ MohsAIQ Surgical site (if tumor spans multiple areas, please select predominant area): lower limb (including hip) Surgery side: left Surgical site (from skin exam): Left Thigh - Posterior Pre-operative length (cm): 1 Pre-operative width (cm): 1.1 Indications for Mohs surgery: anatomic location where tissue conservation is critical Previously treated? No    Micrographic Surgery Details: Post-operative length (cm): 3 Post-operative width (cm): 2 Number of Mohs stages: 1 Is this a complex case (associate members only): No    Stage 1     Tumor features identified on Mohs section: no tumor identified    Depth of defect after stage: subcutaneous fat    Perineural invasion: no perineural invasion  Patient tolerance of procedure: tolerated well, no immediate complications  Reconstruction: Was the defect reconstructed? Yes   Was reconstruction performed by the same Mohs surgeon? Yes   Setting of reconstruction: outpatient office When was reconstruction performed? same day Type of reconstruction: linear Linear reconstruction: complex Length of linear repair (cm): 6  Opioids: Did the patient receive a prescription for opioid/narcotic related to Mohs surgery? Yes   Indications for opioid/narcotics: patient required additional pain relief despite trial of non-opioid analgesia  Antibiotics: Does patient meet AHA guidelines for endocarditis?: No   Does patient meet AHA guidelines for orthopedic prophylaxis?: No   Were antibiotics given on the day of surgery? Yes   When were antibiotics given? post-operative Did surgery breach mucosa, expose cartilage/bone, involve an area of lymphedema/inflamed/infected tissue? No   Indication for post-operative antibiotics: anatomic location  Skin repair Complexity:  Complex Final length (cm):  6.2 Informed consent: discussed and consent obtained   Timeout: patient name, date of birth, surgical site, and procedure verified   Procedure prep:  Patient was prepped and draped in usual sterile fashion Prep type:  Chlorhexidine Anesthesia: the lesion was anesthetized in a standard fashion   Anesthetic:  1% lidocaine w/ epinephrine 1-100,000 buffered w/ 8.4% NaHCO3 Reason for type of repair: reduce tension to allow closure and preserve normal anatomy   Undermining: edges undermined   Subcutaneous layers (deep stitches):  Suture size:  3-0 Suture type: PDS (polydioxanone)   Stitches:  Buried vertical mattress Fine/surface layer approximation (top stitches):  Suture type: cyanoacrylate tissue  glue  Hemostasis achieved with: suture, pressure and electrodesiccation Outcome: patient tolerated procedure well with no complications   Post-procedure details: sterile dressing applied and wound care instructions given   Dressing type: bandage and pressure dressing    traMADol (ULTRAM) 50 MG tablet Take 1 tablet (50 mg total) by mouth every 6 (six) hours as needed for up to 8 doses.   Return in about 4 weeks (around 04/05/2024).   Documentation: I have reviewed the above documentation for accuracy and completeness, and I agree with the above.   03/08/2024  HISTORY OF PRESENT ILLNESS  Veronica Rogers is seen in consultation at the request of Dr. Caralyn Guile for biopsy-proven Melanoma in Situ on the left posterior thigh. They note that the area has been present for about 1 year increasing in size with time.  There is no history of previous treatment.  Reports no other new or changing lesions and has no other complaints today.  Medications and allergies: see patient chart.  Review of systems: Reviewed 8 systems and notable for the above skin cancer.  All other systems reviewed are unremarkable/negative, unless noted in the HPI. Past medical history, surgical history, family history, social history were also reviewed and are noted in the chart/questionnaire.    PHYSICAL EXAMINATION  General: Well-appearing, in no acute distress, alert and oriented x 4. Vitals reviewed in chart (if available).   Skin: Exam reveals a 1.0 x 1.1 cm erythematous papule and biopsy scar on the left posterior thigh. There are rhytids, telangiectasias, and lentigines, consistent with photodamage.   Biopsy report(s) reviewed, confirming the diagnosis.   ASSESSMENT  1) Melanoma in Situ of the left posterior thigh 2) photodamage 3) solar lentigines   PLAN   1. Due to location, size, histology, or recurrence and the likelihood of subclinical extension as well as the need to conserve normal surrounding tissue, the  patient was deemed acceptable for Mohs micrographic surgery (MMS).  The nature and purpose of the procedure, associated benefits and risks including recurrence and scarring, possible complications such as pain, infection, and bleeding, and alternative methods of treatment if appropriate were discussed with the patient during consent. The lesion location was verified by the patient, by reviewing previous notes, pathology reports, and by photographs as well as angulation measurements if available.  Informed consent was reviewed and signed by the patient, and timeout was performed at 9:00 AM. See op note below.  2. For the photodamage and solar lentigines, sun protection discussed/information given on OTC sunscreens, and we recommend continued regular follow-up with primary dermatologist every 6 months or sooner for any growing, bleeding, or changing lesions. 3. Prognosis and future surveillance discussed. 4. Letter with treatment outcome sent to referring provider. 5. Pain acetaminophen/ibuprofen/tramadol 50 mg  MOHS MICROGRAPHIC SURGERY AND RECONSTRUCTION  Initial size:   1.0 x 1.1 cm Surgical defect/wound size: 3.0 x 2.0 cm Anesthesia:    0.33% lidocaine with 1:200,000 epinephrine EBL:    <5 mL Complications:  None Repair type:   Complex SQ suture:   3-0 PDS Cutaneous suture:  Dermabond and steri strips Final size of the repair: 6.2 cm  Stages: 1  STAGE I: Anesthesia achieved with 0.5% lidocaine with 1:200,000 epinephrine. ChloraPrep applied. 1 section(s) excised using Mohs technique (this includes total peripheral and deep tissue margin excision and evaluation with frozen sections, excised and interpreted by the same physician). The tumor was first debulked and then excised with an approx. 2mm margin.  Hemostasis was achieved with electrocautery as needed.  The  specimen was then oriented, subdivided/relaxed, inked, and processed using Mohs technique.  Tissue was stained with H&E and MART-1  immunostain.  Frozen section analysis revealed a clear deep and peripheral margin.  Reconstruction  The surgical wound was then cleaned, prepped, and re-anesthetized as above. Wound edges were undermined extensively along at least one entire edge and at a distance equal to or greater than the width of the defect (see wound defect size above) in order to achieve closure and decrease wound tension and anatomic distortion. Redundant tissue repair including standing cone removal was performed. Hemostasis was achieved with electrocautery. Subcutaneous and epidermal tissues were approximated with the above sutures. The surgical site was then lightly scrubbed with sterile, saline-soaked gauze. Steri-strips were applied, and the area was then bandaged using Vaseline ointment, non-adherent gauze, gauze pads, and tape to provide an adequate pressure dressing. The patient tolerated the procedure well, was given detailed written and verbal wound care instructions, and was discharged in good condition.   The patient will follow-up: 4 weeks.    Gwenith Daily, MD

## 2024-03-10 ENCOUNTER — Other Ambulatory Visit (HOSPITAL_COMMUNITY): Payer: Self-pay

## 2024-03-10 ENCOUNTER — Encounter: Payer: Self-pay | Admitting: Family Medicine

## 2024-03-10 ENCOUNTER — Other Ambulatory Visit: Payer: Self-pay | Admitting: Internal Medicine

## 2024-03-10 DIAGNOSIS — E1165 Type 2 diabetes mellitus with hyperglycemia: Secondary | ICD-10-CM

## 2024-03-10 MED ORDER — RYBELSUS 14 MG PO TABS
14.0000 mg | ORAL_TABLET | Freq: Every day | ORAL | 1 refills | Status: DC
  Filled 2024-03-10: qty 90, 90d supply, fill #0
  Filled 2024-06-02: qty 90, 90d supply, fill #1

## 2024-03-10 MED ORDER — METFORMIN HCL ER 750 MG PO TB24
1500.0000 mg | ORAL_TABLET | Freq: Every day | ORAL | 3 refills | Status: AC
  Filled 2024-03-10: qty 140, 70d supply, fill #0
  Filled 2024-03-10: qty 40, 20d supply, fill #0
  Filled 2024-06-08: qty 180, 90d supply, fill #1
  Filled 2024-09-17: qty 180, 90d supply, fill #2
  Filled 2024-12-17: qty 180, 90d supply, fill #3

## 2024-03-11 ENCOUNTER — Other Ambulatory Visit: Payer: Self-pay

## 2024-03-11 ENCOUNTER — Encounter: Payer: Self-pay | Admitting: Dermatology

## 2024-03-11 ENCOUNTER — Other Ambulatory Visit (HOSPITAL_COMMUNITY): Payer: Self-pay

## 2024-03-26 ENCOUNTER — Other Ambulatory Visit (HOSPITAL_COMMUNITY): Payer: Self-pay

## 2024-03-26 ENCOUNTER — Other Ambulatory Visit: Payer: Self-pay | Admitting: *Deleted

## 2024-03-26 ENCOUNTER — Encounter (HOSPITAL_COMMUNITY): Payer: Self-pay

## 2024-03-26 ENCOUNTER — Encounter: Payer: Self-pay | Admitting: Hematology and Oncology

## 2024-03-26 MED ORDER — OXYBUTYNIN CHLORIDE ER 10 MG PO TB24
10.0000 mg | ORAL_TABLET | Freq: Every morning | ORAL | 4 refills | Status: AC
  Filled 2024-05-10: qty 90, 90d supply, fill #0
  Filled 2024-08-05: qty 90, 90d supply, fill #1
  Filled 2024-10-19 – 2024-11-03 (×2): qty 90, 90d supply, fill #2
  Filled 2025-01-26: qty 90, 90d supply, fill #3

## 2024-03-30 ENCOUNTER — Other Ambulatory Visit (HOSPITAL_COMMUNITY): Payer: Self-pay

## 2024-04-01 ENCOUNTER — Other Ambulatory Visit (HOSPITAL_COMMUNITY): Payer: Self-pay

## 2024-04-06 ENCOUNTER — Ambulatory Visit (INDEPENDENT_AMBULATORY_CARE_PROVIDER_SITE_OTHER): Admitting: Dermatology

## 2024-04-06 ENCOUNTER — Encounter: Payer: Self-pay | Admitting: Dermatology

## 2024-04-06 ENCOUNTER — Ambulatory Visit: Admitting: Dermatology

## 2024-04-06 VITALS — BP 122/79 | HR 89

## 2024-04-06 DIAGNOSIS — S71102D Unspecified open wound, left thigh, subsequent encounter: Secondary | ICD-10-CM

## 2024-04-06 DIAGNOSIS — L905 Scar conditions and fibrosis of skin: Secondary | ICD-10-CM

## 2024-04-06 DIAGNOSIS — Z85820 Personal history of malignant melanoma of skin: Secondary | ICD-10-CM

## 2024-04-06 DIAGNOSIS — L539 Erythematous condition, unspecified: Secondary | ICD-10-CM

## 2024-04-06 DIAGNOSIS — T1490XD Injury, unspecified, subsequent encounter: Secondary | ICD-10-CM

## 2024-04-06 DIAGNOSIS — C439 Malignant melanoma of skin, unspecified: Secondary | ICD-10-CM

## 2024-04-06 NOTE — Patient Instructions (Signed)

## 2024-04-06 NOTE — Progress Notes (Signed)
   Follow Up Visit   Subjective  Veronica Rogers is a 63 y.o. female who presents for the following: follow up from Mohs surgery   The patient presents for follow up from Mohs surgery for a MM on the left thigh, treated on 03/08/24, repaired with linear repair. The patient has been bandaging the wound as directed. The endorse the following concerns: No questions or concerns at this time.   The following portions of the chart were reviewed this encounter and updated as appropriate: medications, allergies, medical history  Review of Systems:  No other skin or systemic complaints except as noted in HPI or Assessment and Plan.  Objective  Well appearing patient in no apparent distress; mood and affect are within normal limits.  A full examination was performed including scalp, head, face and left thigh. All findings within normal limits unless otherwise noted below.  Healing wound with mild erythema  Relevant physical exam findings are noted in the Assessment and Plan.    Assessment & Plan   Healing s/p Mohs for MM, treated on left thigh, repaired with linear repair - Reassured that wound is healing well - No evidence of infection - No swelling, induration, purulence, dehiscence, or tenderness out of proportion to the clinical exam, see photo above - Discussed that scars take up to 12 months to mature from the date of surgery - Recommend SPF 30+ to scar daily to prevent purple color from UV exposure during scar maturation process - Discussed that erythema and raised appearance of scar will fade over the next 4-6 months - OK to start scar massage at 4-6 weeks post-op - Consider silicone products starting now  HISTORY OF MELANOMA - No evidence of recurrence today - No lymphadenopathy - Recommend regular full body skin exams - Recommend daily broad spectrum sunscreen SPF 30+ to sun-exposed areas, reapply every 2 hours as needed.  - Call if any new or changing lesions are noted  between office visits  Return in about 3 months (around 07/06/2024) for tbsc.  I, Manual Meier, Surg Tech III, am acting as scribe for Gwenith Daily, MD.   Documentation: I have reviewed the above documentation for accuracy and completeness, and I agree with the above.  Gwenith Daily, MD

## 2024-04-10 LAB — GLUCOSE, POCT (MANUAL RESULT ENTRY): Glucose Fasting, POC: 80 mg/dL (ref 70–99)

## 2024-04-10 NOTE — Progress Notes (Signed)
 Pt does not have any sdoh insecurities. Pt has a pcp and insurance. Pt does not smoke.

## 2024-04-13 ENCOUNTER — Ambulatory Visit: Payer: 59 | Admitting: Internal Medicine

## 2024-04-15 ENCOUNTER — Ambulatory Visit: Admitting: Dermatology

## 2024-04-15 ENCOUNTER — Other Ambulatory Visit: Payer: Self-pay

## 2024-04-15 ENCOUNTER — Encounter: Payer: Self-pay | Admitting: Internal Medicine

## 2024-04-15 ENCOUNTER — Ambulatory Visit: Admitting: Internal Medicine

## 2024-04-15 ENCOUNTER — Other Ambulatory Visit (HOSPITAL_COMMUNITY): Payer: Self-pay

## 2024-04-15 VITALS — BP 120/76 | HR 71 | Ht 67.0 in | Wt 190.6 lb

## 2024-04-15 DIAGNOSIS — Z7985 Long-term (current) use of injectable non-insulin antidiabetic drugs: Secondary | ICD-10-CM

## 2024-04-15 DIAGNOSIS — Z7984 Long term (current) use of oral hypoglycemic drugs: Secondary | ICD-10-CM | POA: Diagnosis not present

## 2024-04-15 DIAGNOSIS — E1165 Type 2 diabetes mellitus with hyperglycemia: Secondary | ICD-10-CM | POA: Diagnosis not present

## 2024-04-15 DIAGNOSIS — E663 Overweight: Secondary | ICD-10-CM

## 2024-04-15 DIAGNOSIS — E785 Hyperlipidemia, unspecified: Secondary | ICD-10-CM

## 2024-04-15 LAB — MICROALBUMIN / CREATININE URINE RATIO
Creatinine, Urine: 108 mg/dL (ref 20–275)
Microalb Creat Ratio: 12 mg/g{creat} (ref <30)
Microalb, Ur: 1.3 mg/dL

## 2024-04-15 LAB — POCT GLYCOSYLATED HEMOGLOBIN (HGB A1C): Hemoglobin A1C: 6.9 % — AB (ref 4.0–5.6)

## 2024-04-15 MED ORDER — OZEMPIC (0.25 OR 0.5 MG/DOSE) 2 MG/3ML ~~LOC~~ SOPN
0.5000 mg | PEN_INJECTOR | SUBCUTANEOUS | 3 refills | Status: DC
  Filled 2024-04-15: qty 9, 84d supply, fill #0
  Filled 2024-04-15: qty 3, 28d supply, fill #0
  Filled 2024-06-21: qty 9, fill #0

## 2024-04-15 MED ORDER — FREESTYLE LIBRE 3 PLUS SENSOR MISC
3 refills | Status: AC
  Filled 2024-04-15: qty 6, 84d supply, fill #0
  Filled 2024-04-27: qty 6, 90d supply, fill #0
  Filled 2024-10-19 – 2024-10-22 (×2): qty 6, 90d supply, fill #1
  Filled 2024-10-22: qty 6, 90d supply, fill #0
  Filled 2025-01-26: qty 6, 90d supply, fill #1

## 2024-04-15 NOTE — Patient Instructions (Addendum)
 Please continue: - Metformin ER 1500 mg with dinner - Glipizide 5 mg before a larger meal  Stop Rybelsus and try: - Ozempic 0.25 mg weekly in a.m. (for example on Sunday morning) x 4 weeks, then increase to 0.5 mg weekly in a.m. if no nausea or hypoglycemia.  Please return in 4-6 months with your sugar log.

## 2024-04-15 NOTE — Progress Notes (Signed)
 Patient ID: Veronica Rogers, female   DOB: 1961-11-11, 63 y.o.   MRN: [id number]   HPI: Veronica Rogers is a 63 y.o.-year-old female, presenting presenting for f/u for DM2, dx in 2015 (after steroids), non-insulin-dependent, uncontrolled, without long term complications. Last visit 6 months ago.  Interim history: No increased urination, blurry vision, chest pain.   She still has occasional nausea after eating certain foods (hamburger, bacon, tomatoes). Since last visit, she was diagnosed with melanoma 02/2024.  This was resected.  Reviewed HbA1c levels: Lab Results  Component Value Date   HGBA1C 7.2 (A) 10/14/2023   HGBA1C 7.5 (H) 08/04/2023   HGBA1C 6.8 (A) 04/03/2023   HGBA1C 6.9 (A) 12/03/2022   HGBA1C 7.4 (A) 08/01/2022   HGBA1C 7.1 (A) 03/28/2022   HGBA1C 6.3 (A) 10/26/2021   HGBA1C 6.7 (H) 08/02/2021   HGBA1C 6.9 (A) 05/08/2021   HGBA1C 7.2 (H) 10/13/2020   HGBA1C 7.9 (H) 09/13/2019   HGBA1C 6.7 (A) 01/08/2019   HGBA1C 7.2 (A) 09/03/2018   HGBA1C 7.4 04/24/2018   HGBA1C 6.9 11/28/2017   HGBA1C 7.2 08/26/2017   HGBA1C 7.1 05/19/2017   HGBA1C 7.1 01/14/2017   HGBA1C 7.0 10/11/2016   HGBA1C 7.6 (H) 07/24/2016  08/01/2022: HbA1c 7.4%  Pt is on a regimen of:  - Metformin ER 1500 mg with dinner -  >> Rybelsus 7 >> 14 mg before breakfast - Glipizide 5 mg before dinner >> Before a larger meal  She was on insulin during ChTx. She was on regular metformin >> GI upset (diarrhea). She has frequent urination.  Pt checks her sugars 1-3 times a day: - am: 103-162 >> 103-132, 149 >> 115-155, 227 >> 121-130, 140 - 2h after b'fast: 120 >> n/c >> 115 >> 159 >> n/c - before lunch: 125-130 >> n/c >> 111, 138 >> n/c >> 80 - 2h after lunch: 140-149 >> n/c >> 138 >> n/c >> 194 >> n/c - before dinner: 150  >> n/c >> 145, 167 >> n/c >> 104-145 - 2h after dinner: 160s >> n/c >> 189 >> 127 >> n/c - bedtime: n/c  >> 125-135 >> 96, 198 (pizza) >> n/c - nighttime:  n/c >> 99 >> 91-125 >>  116-120, 164 >> 122, 142 Lowest sugar was 91 >> 110 >> 80;  she has hypoglycemia awareness in the 70s. Highest sugar was 149 >> 200 >> 200.  Glucometer: True test >> AccuChek guide >> Freestyle Lite  Pt's meals are: - Breakfast: cereals or 2 eggs or skips >> cereals -- Lunch: leftovers from home: sandwich; soup; meat + veggies; chinese; salad - Dinner: eats out   - No CKD, last BUN/creatinine:  Lab Results  Component Value Date   BUN 17 08/04/2023   CREATININE 0.66 08/04/2023   Lab Results  Component Value Date   MICRALBCREAT 0.7 08/04/2023   MICRALBCREAT 0.7 05/08/2021   MICRALBCREAT 0.8 10/13/2020   MICRALBCREAT 0.6 09/13/2019   MICRALBCREAT 0.5 11/22/2014   -+HL; last set of lipids: Lab Results  Component Value Date   CHOL 162 02/17/2024   HDL 45.30 02/17/2024   LDLCALC 57 02/17/2024   LDLDIRECT 73 12/28/2021   TRIG 300.0 (H) 02/17/2024   CHOLHDL 4 02/17/2024  On Lipitor 10 mg daily.  - last eye exam was on 06/03/2023: No DR. Dr. Candi Chafe.    -She denies numbness and tingling in her feet.  She has a history of plantar fasciitis.  Last foot exam 04/19/2023.  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 On Tamoxifen. She  was found to have a low vitamin B12 on 06/28/2022: B12 139 - started on p.o. supplementation: 1000 mcg daily. On 09/02/2022 she was in the emergency room for left hip pain and also nausea.  Of note, lipase was normal at 14.  ROS: + see HPI  I reviewed pt's medications, allergies, PMH, social hx, family hx, and changes were documented in the history of present illness. Otherwise, unchanged from my initial visit note.  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   Left post thigh - needs Immuno Mohs (scheduled 03/08/2024)    Personal history of chemotherapy    Personal history of radiation therapy    Radiation 03/28/14-05/06/14   Left Breast 60 Gy   Past Surgical History:  Procedure Laterality Date   BREAST BIOPSY Right 12/30/1994   breast, benign   BREAST LUMPECTOMY WITH NEEDLE LOCALIZATION AND AXILLARY SENTINEL LYMPH NODE BX Left 10/29/2013   Procedure: BREAST LUMPECTOMY WITH NEEDLE LOCALIZATION AND AXILLARY SENTINEL LYMPH NODE BX;  Surgeon: Enid Harry, MD;  Location: [facility];  Service: General;  Laterality: Left;   COLONOSCOPY  05/31/2011   DJ-F/V movi(good)-F/V-normal   TOTAL KNEE ARTHROPLASTY Right 09/11/2021   Procedure: TOTAL KNEE ARTHROPLASTY;  Surgeon: Claiborne Crew, MD;  Location: [facility];  Service: Orthopedics;  Laterality: Right;   WISDOM TOOTH EXTRACTION     Social History   Social History   Marital Status: Married    Spouse Name: N/A   Number of Children: 2   Occupational History   [employer] manager - support rep   Social History Main Topics   Smoking status: Never Smoker    Smokeless tobacco: Never Used   Alcohol Use: Yes     Comment: 1-2 drinks a month   Drug Use: No   Current Outpatient Medications on File Prior to Visit  Medication Sig Dispense Refill   albuterol (VENTOLIN HFA) 108 (90 Base) MCG/ACT inhaler Inhale 1-2 puffs into the lungs every 6 (six) hours as needed for wheezing or shortness of breath. (Patient not taking: Reported on 02/17/2024) 6.7 g 1   amoxicillin (AMOXIL) 500 MG capsule Take 4 capsules (2,000 mg total) by mouth 1 hour prior to dental appointment (Patient not taking: Reported on 02/17/2024) 16 capsule 1   atorvastatin (LIPITOR) 10 MG tablet Take 1 tablet (10 mg total) by mouth daily. 90 tablet 3   Blood Glucose Monitoring Suppl (FREESTYLE FREEDOM LITE) w/Device KIT Use as advised 1 kit 0   cyanocobalamin (VITAMIN B12) 1000 MCG tablet Take 1 tablet (1,000 mcg total) by mouth daily. 100 tablet 1   ferrous sulfate 325 (65 FE) MG  EC tablet Take 325 mg by mouth 3 (three) times daily with meals. (Patient not taking: Reported on 02/17/2024)     glucose blood (FREESTYLE LITE) test strip Use as directed twice daily for glucose testing 200 each 3   hydrocortisone-pramoxine (ANALPRAM-HC) 2.5-1 % rectal cream      influenza vac split quadrivalent PF (FLUARIX) 0.5 ML injection Inject into the muscle. (Patient not taking: Reported on 02/17/2024) 0.5 mL 0   Lancets (FREESTYLE) lancets UAD for bid blood glucose testing Dx: E11.65 100  each 12   meloxicam (MOBIC) 7.5 MG tablet Take 1 tablet (7.5 mg total) by mouth daily. 30 tablet 0   metFORMIN (GLUCOPHAGE-XR) 750 MG 24 hr tablet Take 2 tablets (1,500 mg total) by mouth at bedtime. 180 tablet 3   Multiple Vitamin (MULTIVITAMIN PO) Take by mouth daily.     oxybutynin (DITROPAN-XL) 10 MG 24 hr tablet Take 1 tablet (10 mg total) by mouth daily in the mid-morning 90 tablet 4   oxybutynin (DITROPAN-XL) 10 MG 24 hr tablet Take 1 tablet (10 mg total) by mouth daily in the mid-morning. 90 tablet 4   Semaglutide (RYBELSUS) 14 MG TABS Take 1 tablet (14 mg total) by mouth daily. 90 tablet 1   sertraline (ZOLOFT) 50 MG tablet Take 1 tablet (50 mg total) by mouth daily. 90 tablet 3   traMADol (ULTRAM) 50 MG tablet Take 1 tablet (50 mg total) by mouth every 6 (six) hours as needed for up to 8 doses. 8 tablet 0   No current facility-administered medications on file prior to visit.   Allergies  Allergen Reactions   Erythromycin Other (See Comments)    Stomach pain   B-Complex-B-12 [B Complex Vitamins] Itching and Rash    Itchy skin   Cefuroxime Other (See Comments)   Vitamin B12 Other (See Comments)   Tape Rash   Family History  Problem Relation Age of Onset   Hypertension Mother    Squamous cell carcinoma Father    Cancer Father    Diabetes Father    Diabetes Sister    Colon polyps Neg Hx    Colon cancer Neg Hx    Esophageal cancer Neg Hx    Stomach cancer Neg Hx    Rectal cancer Neg  Hx    PE: BP 120/76 (BP Location: Left Arm, Patient Position: Sitting, Cuff Size: Small)   Pulse 71   Ht 5\' 7"  (1.702 m)   Wt 190 lb 9.6 oz (86.5 kg)   LMP 11/08/2013   SpO2 95%   BMI 29.85 kg/m   Wt Readings from Last 10 Encounters:  04/15/24 190 lb 9.6 oz (86.5 kg)  02/17/24 188 lb 3.2 oz (85.4 kg)  01/29/24 187 lb (84.8 kg)  10/14/23 185 lb (83.9 kg)  10/02/23 182 lb 3.2 oz (82.6 kg)  08/04/23 186 lb 3.2 oz (84.5 kg)  04/03/23 183 lb 9.6 oz (83.3 kg)  12/03/22 182 lb (82.6 kg)  10/01/22 184 lb 9.6 oz (83.7 kg)  09/12/22 189 lb (85.7 kg)   Constitutional: overweight, in NAD Eyes:  EOMI, no exophthalmos ENT: no thyromegaly, no cervical lymphadenopathy Cardiovascular: RRR, No MRG Respiratory: CTA B Musculoskeletal: no deformities Skin: no rashes Neurological: no tremor with outstretched hands Diabetic Foot Exam - Simple   Simple Foot Form Diabetic Foot exam was performed with the following findings: Yes 04/15/2024  8:18 AM  Visual Inspection No deformities, no ulcerations, no other skin breakdown bilaterally: Yes Sensation Testing Intact to touch and monofilament testing bilaterally: Yes Pulse Check Posterior Tibialis and Dorsalis pulse intact bilaterally: Yes Comments    ASSESSMENT: 1. DM2, non-insulin-dependent, uncontrolled, without long term complications, but with hyperglycemia  No FH of MTC or personal hx of pancreatitis.  2. HL  3.  Overweight  PLAN:  1. Patient with longstanding, uncontrolled, type 2 diabetes, on oral medication with metformin and GLP-1 receptor agonist, to which we added prn sulfonylurea before larger meals at last visit.  At that time, HbA1c was slightly lower, at 7.2%, decreased from  7.5%.  Sugars were slightly higher than goal in the morning and better later in the day.  We added glipizide 5 mg before a larger meal and did not change the rest of the regimen. - At today's visit, sugars have improved.  However, she is not checking  consistently.  She had 1 high blood sugar at 200 after having had Jell-O.  I would not have suggested a change in regimen for now, but she is interested in trying Ozempic.  Will start at a low dose and increase as tolerated.  She did not inject insulin before so is aware about insulin injection techniques.  We reviewed these at today's visit.  For now we will continue metformin and she can still take glipizide as needed for larger meals, but I suspect that we may need to reduce these in the future if she is able to continue on Ozempic. - I also recommended a CGM at today's visit.  She agrees to try this if covered. - I suggested to:  Patient Instructions  Please continue: - Metformin ER 1500 mg with dinner - Glipizide 5 mg before a larger meal  Stop Rybelsus and try: - Ozempic 0.25 mg weekly in a.m. (for example on Sunday morning) x 4 weeks, then increase to 0.5 mg weekly in a.m. if no nausea or hypoglycemia.  Please return in 4-6 months with your sugar log.  - we checked her HbA1c: 6.9% (lower) - advised to check sugars at different times of the day - 4x a day, rotating check times - advised for yearly eye exams >> she is UTD - return to clinic in 4-6 months   2. HL -Latest lipid panel was reviewed from 01/2024: LDL and HDL at goal, but triglycerides still elevated.  However, they are improved from 700s in the past: Lab Results  Component Value Date   CHOL 162 02/17/2024   HDL 45.30 02/17/2024   LDLCALC 57 02/17/2024   LDLDIRECT 73 12/28/2021   TRIG 300.0 (H) 02/17/2024   CHOLHDL 4 02/17/2024  -She is on Lipitor 10 mg daily without side effects  3.  Overweight - At today's visit, we will try to switch from Rybelsus to Ozempic which should also help with weight loss - She gained 2 pounds before last visit - However, since last visit, she gained 5 pounds  Emilie Harden, MD PhD [facility] Endocrinology

## 2024-04-27 ENCOUNTER — Other Ambulatory Visit (HOSPITAL_COMMUNITY): Payer: Self-pay

## 2024-04-28 ENCOUNTER — Encounter: Payer: Self-pay | Admitting: Internal Medicine

## 2024-05-10 ENCOUNTER — Other Ambulatory Visit (HOSPITAL_COMMUNITY): Payer: Self-pay

## 2024-05-11 ENCOUNTER — Other Ambulatory Visit (HOSPITAL_COMMUNITY): Payer: Self-pay

## 2024-05-12 ENCOUNTER — Other Ambulatory Visit: Payer: Self-pay | Admitting: Hematology and Oncology

## 2024-05-12 DIAGNOSIS — Z1231 Encounter for screening mammogram for malignant neoplasm of breast: Secondary | ICD-10-CM

## 2024-05-27 ENCOUNTER — Encounter: Payer: Self-pay | Admitting: Dermatology

## 2024-05-27 ENCOUNTER — Other Ambulatory Visit (HOSPITAL_COMMUNITY): Payer: Self-pay

## 2024-05-27 MED ORDER — AMOXICILLIN 500 MG PO CAPS
2000.0000 mg | ORAL_CAPSULE | Freq: Once | ORAL | 0 refills | Status: AC
  Filled 2024-05-27: qty 16, 4d supply, fill #0

## 2024-06-02 ENCOUNTER — Encounter: Payer: Self-pay | Admitting: Internal Medicine

## 2024-06-02 ENCOUNTER — Other Ambulatory Visit (HOSPITAL_COMMUNITY): Payer: Self-pay

## 2024-06-21 ENCOUNTER — Other Ambulatory Visit (HOSPITAL_COMMUNITY): Payer: Self-pay

## 2024-06-21 MED ORDER — SERTRALINE HCL 50 MG PO TABS
50.0000 mg | ORAL_TABLET | Freq: Every day | ORAL | 3 refills | Status: AC
  Filled 2024-06-21: qty 90, 90d supply, fill #0
  Filled 2024-09-30: qty 90, 90d supply, fill #1
  Filled 2024-12-28: qty 90, 90d supply, fill #2

## 2024-06-24 ENCOUNTER — Ambulatory Visit
Admission: RE | Admit: 2024-06-24 | Discharge: 2024-06-24 | Disposition: A | Discharge status: Home / Self Care | Source: Ambulatory Visit | Attending: Hematology and Oncology | Admitting: Hematology and Oncology

## 2024-06-24 DIAGNOSIS — Z1231 Encounter for screening mammogram for malignant neoplasm of breast: Secondary | ICD-10-CM | POA: Diagnosis not present

## 2024-06-25 NOTE — Progress Notes (Signed)
 The patient attended a screening event on 04/10/2024, where her blood pressure was measured at 127/77 mmHg, and her fasting blood glucose was 80 mg/dl. During the event, the patient reported that she does not smoke, has indicated other for her insurance, is established with a primary care provider Yvette), and she indicated n/a for her SDOH needs.   A chart review confirmed that the patient has an active PCP, Dr. Elsie Lent - [facility]. The pt's most recent office visit with her PCP was on 02/17/2024. The pt has an upcoming appt on 08/10/2024 at 9:00 AM. The pt has the [employer]. Chart review further indicates no SDOH needs. The patient demonstrates consistent engagement in her care.   At this time, no additional support from the Health Equity Team is indicated.

## 2024-06-28 ENCOUNTER — Ambulatory Visit: Payer: Self-pay

## 2024-06-28 ENCOUNTER — Encounter: Payer: Self-pay | Admitting: Family Medicine

## 2024-06-28 ENCOUNTER — Other Ambulatory Visit (HOSPITAL_COMMUNITY): Payer: Self-pay

## 2024-06-28 ENCOUNTER — Ambulatory Visit: Admitting: Family Medicine

## 2024-06-28 ENCOUNTER — Ambulatory Visit (INDEPENDENT_AMBULATORY_CARE_PROVIDER_SITE_OTHER)
Admission: RE | Admit: 2024-06-28 | Discharge: 2024-06-28 | Disposition: A | Discharge status: Home / Self Care | Source: Ambulatory Visit | Attending: Family Medicine

## 2024-06-28 VITALS — BP 130/90 | HR 77 | Temp 98.1°F | Ht 67.0 in | Wt 188.0 lb

## 2024-06-28 DIAGNOSIS — M542 Cervicalgia: Secondary | ICD-10-CM | POA: Diagnosis not present

## 2024-06-28 DIAGNOSIS — M19011 Primary osteoarthritis, right shoulder: Secondary | ICD-10-CM | POA: Diagnosis not present

## 2024-06-28 DIAGNOSIS — M4802 Spinal stenosis, cervical region: Secondary | ICD-10-CM | POA: Diagnosis not present

## 2024-06-28 DIAGNOSIS — M25511 Pain in right shoulder: Secondary | ICD-10-CM

## 2024-06-28 DIAGNOSIS — M47812 Spondylosis without myelopathy or radiculopathy, cervical region: Secondary | ICD-10-CM | POA: Diagnosis not present

## 2024-06-28 DIAGNOSIS — M4312 Spondylolisthesis, cervical region: Secondary | ICD-10-CM | POA: Diagnosis not present

## 2024-06-28 MED ORDER — TIZANIDINE HCL 4 MG PO TABS
4.0000 mg | ORAL_TABLET | Freq: Three times a day (TID) | ORAL | 0 refills | Status: DC | PRN
  Filled 2024-06-28: qty 30, 10d supply, fill #0

## 2024-06-28 MED ORDER — MELOXICAM 15 MG PO TABS
15.0000 mg | ORAL_TABLET | Freq: Every day | ORAL | 0 refills | Status: DC
  Filled 2024-06-28: qty 30, 30d supply, fill #0

## 2024-06-28 MED ORDER — KETOROLAC TROMETHAMINE 30 MG/ML IJ SOLN
30.0000 mg | Freq: Once | INTRAMUSCULAR | Status: AC
  Administered 2024-06-28: 30 mg via INTRAMUSCULAR

## 2024-06-28 NOTE — Telephone Encounter (Signed)
 FYI Only or Action Required?: FYI only for provider.  Patient was last seen in primary care on 02/17/2024 by Berneta Elsie Sayre, MD. Called Nurse Triage reporting Shoulder Pain and Arm Pain. Symptoms began a week ago. Interventions attempted: OTC medications: ibuprofen , Prescription medications: meloxicam , and Ice/heat application and massage. Symptoms are: right shoulder pain, right bicep pain, pain radiates down to right handgradually worsening.  Triage Disposition: See PCP When Office is Open (Within 3 Days)  Patient/caregiver understands and will follow disposition?: Yes                     Copied from CRM [id number]. Topic: Clinical - Red Word Triage >> Jun 28, 2024  8:18 AM Rosina BIRCH wrote: Red Word that prompted transfer to Nurse Triage: severe right arm pain and right shoulder pain since 6/22 and has gotten worse Pain 8 out out 10 Reason for Disposition  [1] MODERATE pain (e.g., interferes with normal activities) AND [2] present > 3 days  Answer Assessment - Initial Assessment Questions Patient states she spoke with the after hours triage this morning at 0715.   1. ONSET: When did the pain start?     06/20/24.  2. LOCATION: Where is the pain located?     Right.  3. PAIN: How bad is the pain? (Scale 1-10; or mild, moderate, severe)   - MILD (1-3): doesn't interfere with normal activities   - MODERATE (4-7): interferes with normal activities (e.g., work or school) or awakens from sleep   - SEVERE (8-10): excruciating pain, unable to do any normal activities, unable to move arm at all due to pain     8/10. Radiates down to right hand.  4. WORK OR EXERCISE: Has there been any recent work or exercise that involved this part of the body?     Patient  states she thinks she might have lifted something heavy  5. CAUSE: What do you think is causing the shoulder pain?     Unsure.  6. OTHER SYMPTOMS: Do you have any other symptoms? (e.g., neck pain,  swelling, rash, fever, numbness, weakness)     Right arm pain near bicep. Patient denies numbness, weakness, rash, swelling, fever, neck pain.  7. PREGNANCY: Is there any chance you are pregnant? When was your last menstrual period?     N/A.  Protocols used: Shoulder Pain-A-AH

## 2024-06-28 NOTE — Progress Notes (Signed)
 Assessment & Plan   Assessment/Plan:    Assessment & Plan Right shoulder pain Right shoulder pain following lifting a heavy tub eight days ago. Pain is throbbing and sometimes electrical, extending from the shoulder down the arm, involving the bicep, tricep, and elbow. Pain is not improving and is exacerbated by certain movements. Differential diagnosis includes muscular strain, rotator cuff injury, or adhesive capsulitis, especially considering her diabetes. No neck pain reported, but cervical involvement is considered due to the nature of the pain. Anticipated outcome is symptom improvement within a week. Alternatives include rest, heat, ice, and shoulder exercises. - Order cervical spine and right shoulder x-rays - Increase meloxicam  to 15 mg once daily - Prescribe tizanidine 4 mg every 8 hours as needed - Administer ketorolac  injection in office - Provide shoulder exercises - Advise use of heat and ice - Recommend rest and avoiding overexertion - Advise follow-up if no improvement      Medications Discontinued During This Encounter  Medication Reason   meloxicam  (MOBIC ) 7.5 MG tablet     Return if symptoms worsen or fail to improve.        Subjective:   Encounter date: 06/28/2024  Veronica Rogers is a 63 y.o. female who has 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HCC); Allergic rhinitis; Type 2 diabetes mellitus with hyperglycemia, without long-term current use of insulin  (HCC); Hyperlipidemia; OA (osteoarthritis) of knee; Healthcare maintenance; Carpal tunnel syndrome of left wrist; Chronic pain of right knee; Wound of abdomen; Pre-op evaluation; S/P total knee arthroplasty, right; Arthralgia of right temporomandibular joint; Need for SBE (subacute bacterial endocarditis) prophylaxis; Microcytic anemia; Atypical nevus; B12 deficiency; and Strain of right shoulder on their problem list..   She  has a past medical history of Arthritis, Breast cancer  (HCC), Contact lens/glasses fitting, Diabetes (HCC) (11/22/2014), GERD (gastroesophageal reflux disease), Hyperlipidemia, Melanoma in situ (HCC) (02/24/2024), Personal history of chemotherapy, Personal history of radiation therapy, and Radiation (03/28/14-05/06/14)..   She presents with chief complaint of Shoulder Pain (Pain starts in shoulder goes down into arm since 06/20/24; throbbing pain; I believe I lifted something heavy and it started the pain; nothing helping with pain; pain level today is 8/10) .   Discussed the use of AI scribe software for clinical note transcription with the patient, who gave verbal consent to proceed.  History of Present Illness Veronica Rogers is a 63 year old female who presents with right shoulder and arm pain.  She has been experiencing right shoulder and arm pain for approximately eight days, which began after [place]. The pain was exacerbated after lifting another heavy object with her husband's assistance four days ago. The pain is located in the posterior aspect of the right shoulder and extends to the bicep, tricep, and elbow. It is described as a throbbing sensation, sometimes feeling like 'nerve pain' or 'electrical zingy'. The pain is triggered when sitting up and is alleviated when she holds her arm up. She notes that the pain does not seem to be improving.  She has tried taking meloxicam  7.5 mg and a muscle relaxer provided by her sister, which helped her rest better. She took an afternoon nap from 2:30 to 5:30 PM, which is unusual for her. She mentions having a lot going on recently, including a funeral and family events.  No neck pain, although she has experienced neck pain in the past related to holding the phone at work. No other associated  symptoms.     ROS  Past Surgical History:  Procedure Laterality Date   BREAST BIOPSY Right 12/30/1994   breast, benign   BREAST LUMPECTOMY Left 11/2013   BREAST  LUMPECTOMY WITH NEEDLE LOCALIZATION AND AXILLARY SENTINEL LYMPH NODE BX Left 10/29/2013   Procedure: BREAST LUMPECTOMY WITH NEEDLE LOCALIZATION AND AXILLARY SENTINEL LYMPH NODE BX;  Surgeon: Donnice Bury, MD;  [facility]:  [facility];  Service: General;  Laterality: Left;   COLONOSCOPY  05/31/2011   DJ-F/V movi(good)-F/V-normal   TOTAL KNEE ARTHROPLASTY Right 09/11/2021   Procedure: TOTAL KNEE ARTHROPLASTY;  Surgeon: Ernie Donnice, MD;  Location: [facility];  Service: Orthopedics;  Laterality: Right;   WISDOM TOOTH EXTRACTION      Outpatient Medications Prior to Visit  Medication Sig Dispense Refill   atorvastatin  (LIPITOR) 10 MG tablet Take 1 tablet (10 mg total) by mouth daily. 90 tablet 3   Blood Glucose Monitoring Suppl (FREESTYLE FREEDOM LITE) w/Device KIT Use as advised 1 kit 0   Continuous Glucose Sensor (FREESTYLE LIBRE 3 PLUS SENSOR) MISC Apply a new sensor every 15 days 6 each 3   cyanocobalamin  (VITAMIN B12) 1000 MCG tablet Take 1 tablet (1,000 mcg total) by mouth daily. 100 tablet 1   glucose blood (FREESTYLE LITE) test strip Use as directed twice daily for glucose testing 200 each 3   hydrocortisone -pramoxine (ANALPRAM -HC) 2.5-1 % rectal cream      Lancets (FREESTYLE) lancets UAD for bid blood glucose testing Dx: E11.65 100 each 12   metFORMIN  (GLUCOPHAGE -XR) 750 MG 24 hr tablet Take 2 tablets (1,500 mg total) by mouth at bedtime. 180 tablet 3   Multiple Vitamin (MULTIVITAMIN PO) Take by mouth daily.     oxybutynin  (DITROPAN -XL) 10 MG 24 hr tablet Take 1 tablet (10 mg total) by mouth daily in the mid-morning 90 tablet 4   oxybutynin  (DITROPAN -XL) 10 MG 24 hr tablet Take 1 tablet (10 mg total) by mouth daily in the mid-morning. 90 tablet 4   Semaglutide  (RYBELSUS ) 14 MG TABS Take 1 tablet (14 mg total) by mouth daily. 90 tablet 1   Semaglutide ,0.25 or 0.5MG /DOS, (OZEMPIC , 0.25 OR 0.5 MG/DOSE,) 2 MG/3ML SOPN Inject 0.5 mg into the skin once a week. 9 mL 3   sertraline   (ZOLOFT ) 50 MG tablet Take 1 tablet (50 mg total) by mouth daily. 90 tablet 3   traMADol  (ULTRAM ) 50 MG tablet Take 1 tablet (50 mg total) by mouth every 6 (six) hours as needed for up to 8 doses. 8 tablet 0   meloxicam  (MOBIC ) 7.5 MG tablet Take 1 tablet (7.5 mg total) by mouth daily. 30 tablet 0   albuterol  (VENTOLIN  HFA) 108 (90 Base) MCG/ACT inhaler Inhale 1-2 puffs into the lungs every 6 (six) hours as needed for wheezing or shortness of breath. (Patient not taking: Reported on 06/28/2024) 6.7 g 1   amoxicillin  (AMOXIL ) 500 MG capsule Take 4 capsules (2,000 mg total) by mouth 1 hour prior to dental appointment (Patient not taking: Reported on 06/28/2024) 16 capsule 1   ferrous sulfate  325 (65 FE) MG EC tablet Take 325 mg by mouth 3 (three) times daily with meals. (Patient not taking: Reported on 06/28/2024)     influenza vac split quadrivalent PF (FLUARIX) 0.5 ML injection Inject into the muscle. (Patient not taking: Reported on 06/28/2024) 0.5 mL 0   No facility-administered medications prior to visit.    Family History  Problem Relation Age of Onset   Hypertension Mother    Squamous cell carcinoma  Father    Cancer Father    Diabetes Father    Diabetes Sister    Colon polyps Neg Hx    Colon cancer Neg Hx    Esophageal cancer Neg Hx    Stomach cancer Neg Hx    Rectal cancer Neg Hx    Breast cancer Neg Hx    BRCA 1/2 Neg Hx     Social History   Socioeconomic History   Marital status: Married    Spouse name: Not on file   Number of children: Not on file   Years of education: Not on file   Highest education level: Some college, no degree  Occupational History   Not on file  Tobacco Use   Smoking status: Never   Smokeless tobacco: Never  Vaping Use   Vaping status: Never Used  Substance and Sexual Activity   Alcohol use: Yes    Comment: rarely   Drug use: No   Sexual activity: Not on file  Other Topics Concern   Not on file  Social History Narrative   Not on file    Social Drivers of Health   Financial Resource Strain: Low Risk  (01/28/2024)   Overall Financial Resource Strain (CARDIA)    Difficulty of Paying Living Expenses: Not very hard  Food Insecurity: No Food Insecurity (04/10/2024)   Hunger Vital Sign    Worried About Running Out of Food in the Last Year: Never true    Ran Out of Food in the Last Year: Never true  Transportation Needs: No Transportation Needs (04/10/2024)   PRAPARE - Administrator, Civil Service (Medical): No    Lack of Transportation (Non-Medical): No  Physical Activity: Insufficiently Active (01/28/2024)   Exercise Vital Sign    Days of Exercise per Week: 3 days    Minutes of Exercise per Session: 10 min  Stress: No Stress Concern Present (01/28/2024)   [employer] of Occupational Health - Occupational Stress Questionnaire    Feeling of Stress : Only a little  Social Connections: Unknown (01/28/2024)   Social Connection and Isolation Panel    Frequency of Communication with Friends and Family: More than three times a week    Frequency of Social Gatherings with Friends and Family: Twice a week    Attends Religious Services: More than 4 times per year    Active Member of [employer] or Organizations: Not on file    Attends Banker Meetings: Not on file    Marital Status: Married  Intimate Partner Violence: Not At Risk (04/10/2024)   Humiliation, Afraid, Rape, and Kick questionnaire    Fear of Current or Ex-Partner: No    Emotionally Abused: No    Physically Abused: No    Sexually Abused: No                                                                                                  Objective:  Physical Exam: BP (!) 130/90   Pulse 77   Temp 98.1 F (36.7 C)   Ht 5' 7 (1.702 m)   Wt  188 lb (85.3 kg)   LMP 11/08/2013   SpO2 96%   BMI 29.44 kg/m    Physical Exam GENERAL: Alert, cooperative, well developed, no acute distress HEENT: Normocephalic, normal oropharynx, moist mucous  membranes CHEST: Clear to auscultation bilaterally, no wheezes, rhonchi, or crackles CARDIOVASCULAR: Normal heart rate and rhythm, S1 and S2 normal without murmurs ABDOMEN: Soft, non-tender, non-distended, without organomegaly, normal bowel sounds EXTREMITIES: No cyanosis or edema MUSCULOSKELETAL: Mild tenderness on palpation of right shoulder, full ROM, 5/5 strength in RUE throughout NEUROLOGICAL: Cranial nerves grossly intact, moves all extremities without gross motor or sensory deficit   Physical Exam  No results found.  Recent Results (from the past 2160 hours)  POCT glucose (manual entry)     Status: None   Collection Time: 04/10/24  1:01 PM  Result Value Ref Range   Glucose Fasting, POC 80 70 - 99 mg/dL  POCT glycosylated hemoglobin (Hb A1C)     Status: Abnormal   Collection Time: 04/15/24  8:21 AM  Result Value Ref Range   Hemoglobin A1C 6.9 (A) 4.0 - 5.6 %   HbA1c POC (<> result, manual entry)     HbA1c, POC (prediabetic range)     HbA1c, POC (controlled diabetic range)    Microalbumin / creatinine urine ratio     Status: None   Collection Time: 04/15/24  8:30 AM  Result Value Ref Range   Creatinine, Urine 108 20 - 275 mg/dL   Microalb, Ur 1.3 mg/dL    Comment: Reference Range Not established    Microalb Creat Ratio 12 <30 mg/g creat    Comment: . The ADA defines abnormalities in albumin excretion as follows: SABRA Albuminuria Category        Result (mg/g creatinine) . Normal to Mildly increased   <30 Moderately increased         30-299  Severely increased           > OR = 300 . The ADA recommends that at least two of three specimens collected within a 3-6 month period be abnormal before considering a patient to be within a diagnostic category.         Beverley Adine Hummer, MD, MS

## 2024-06-28 NOTE — Patient Instructions (Addendum)
 VISIT SUMMARY: You came in today due to right shoulder and arm pain that started after lifting a heavy tub of groceries. The pain has been present for about eight days and worsened after lifting another heavy object. The pain is throbbing and sometimes feels like nerve pain, extending from your shoulder down to your elbow. You have tried meloxicam  and a muscle relaxer, which helped you rest better.  YOUR PLAN: -RIGHT SHOULDER PAIN: Your right shoulder pain is likely due to a muscular strain, rotator cuff injury, or adhesive capsulitis, especially considering your diabetes. We will start by increasing your meloxicam  dose to 15 mg once daily and prescribing tizanidine 4 mg every 8 hours as needed. You received a ketorolac  injection in the office today. Additionally, you should use heat and ice on your shoulder, perform the shoulder exercises provided, and avoid overexertion. We have also ordered x-rays of your cervical spine and right shoulder to further investigate the cause of your pain.  INSTRUCTIONS: Please follow up if there is no improvement in your symptoms. Make sure to get the x-rays of your cervical spine and right shoulder as ordered.

## 2024-06-29 ENCOUNTER — Ambulatory Visit: Payer: Self-pay | Admitting: Family Medicine

## 2024-06-29 DIAGNOSIS — H43812 Vitreous degeneration, left eye: Secondary | ICD-10-CM | POA: Diagnosis not present

## 2024-06-29 DIAGNOSIS — E119 Type 2 diabetes mellitus without complications: Secondary | ICD-10-CM | POA: Diagnosis not present

## 2024-06-29 DIAGNOSIS — H43811 Vitreous degeneration, right eye: Secondary | ICD-10-CM | POA: Diagnosis not present

## 2024-06-29 DIAGNOSIS — H2513 Age-related nuclear cataract, bilateral: Secondary | ICD-10-CM | POA: Diagnosis not present

## 2024-06-29 LAB — HM DIABETES EYE EXAM

## 2024-06-30 ENCOUNTER — Other Ambulatory Visit (HOSPITAL_BASED_OUTPATIENT_CLINIC_OR_DEPARTMENT_OTHER): Payer: Self-pay

## 2024-06-30 ENCOUNTER — Encounter (HOSPITAL_BASED_OUTPATIENT_CLINIC_OR_DEPARTMENT_OTHER): Payer: Self-pay | Admitting: Student

## 2024-06-30 ENCOUNTER — Ambulatory Visit (HOSPITAL_BASED_OUTPATIENT_CLINIC_OR_DEPARTMENT_OTHER): Admitting: Student

## 2024-06-30 DIAGNOSIS — M5412 Radiculopathy, cervical region: Secondary | ICD-10-CM

## 2024-06-30 MED ORDER — METHYLPREDNISOLONE 4 MG PO TBPK
ORAL_TABLET | ORAL | 0 refills | Status: DC
  Filled 2024-06-30: qty 21, 6d supply, fill #0

## 2024-06-30 NOTE — Progress Notes (Signed)
 Chief Complaint: Right shoulder pain    Discussed the use of AI scribe software for clinical note transcription with the patient, who gave verbal consent to proceed.  History of Present Illness Veronica Rogers is a 63 year old female who presents with right shoulder and arm pain.  She experiences severe pain in her right shoulder and arm, extending to the forearm and occasionally affecting the middle finger. The pain is deep and aching, with burning or tingling sensations. It intensifies when holding objects, as evidenced during an x-ray. The pain began after a beach trip approximately a week and a half ago and has persisted. She uses heat and ice for temporary relief and takes Tizanidine and Meloxicam . 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 Lifting her arm overhead provides some relief. There is no numbness or tingling, and neck movement does not exacerbate the pain.   Surgical History:   None  PMH/PSH/Family History/Social History/Meds/Allergies:    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   Left post thigh - needs Immuno Mohs (scheduled 03/08/2024)   Personal history of chemotherapy    Personal history of radiation therapy    Radiation 03/28/14-05/06/14   Left Breast 60 Gy   Past Surgical History:  Procedure Laterality Date   BREAST BIOPSY Right 12/30/1994   breast, benign   BREAST LUMPECTOMY Left 11/2013   BREAST LUMPECTOMY WITH NEEDLE LOCALIZATION AND AXILLARY SENTINEL LYMPH NODE BX Left 10/29/2013   Procedure: BREAST LUMPECTOMY WITH NEEDLE LOCALIZATION AND AXILLARY SENTINEL LYMPH NODE BX;  Surgeon: Donnice Bury, MD;  Location: [facility];  Service: General;  Laterality: Left;   COLONOSCOPY  05/31/2011   DJ-F/V movi(good)-F/V-normal   TOTAL KNEE ARTHROPLASTY Right 09/11/2021   Procedure: TOTAL KNEE ARTHROPLASTY;  Surgeon: Ernie Donnice, MD;  Location: [facility];  Service: Orthopedics;  Laterality: Right;   WISDOM TOOTH EXTRACTION     Social History   Socioeconomic History   Marital status: Married    Spouse name: Not on file   Number of children: Not on file   Years of education: Not on file   Highest education level: Some college, no degree  Occupational History   Not on file  Tobacco Use   Smoking status: Never   Smokeless tobacco: Never  Vaping Use   Vaping status: Never Used  Substance and Sexual Activity   Alcohol use: Yes    Comment: rarely   Drug use: No   Sexual activity: Not on file  Other Topics Concern   Not on file  Social History Narrative   Not on file   Social Drivers of Health   Financial Resource Strain: Low Risk  (01/28/2024)   Overall Financial Resource Strain (CARDIA)    Difficulty of Paying Living Expenses: Not very hard  Food Insecurity: No Food Insecurity (04/10/2024)   Hunger Vital Sign    Worried About Running Out of Food in the Last  Year: Never true    Ran Out of Food in the Last Year: Never true  Transportation Needs: No Transportation Needs (04/10/2024)   PRAPARE - Administrator, Civil Service (Medical): No    Lack of Transportation (Non-Medical): No  Physical Activity: Insufficiently Active (01/28/2024)   Exercise Vital Sign    Days of Exercise per Week: 3 days    Minutes of Exercise per Session: 10 min  Stress: No Stress Concern Present (01/28/2024)   [employer] of Occupational Health - Occupational Stress Questionnaire    Feeling of Stress : Only a little  Social Connections: Unknown (01/28/2024)   Social Connection and Isolation Panel    Frequency of Communication with Friends and Family: More than three  times a week    Frequency of Social Gatherings with Friends and Family: Twice a week    Attends Religious Services: More than 4 times per year    Active Member of [employer] or Organizations: Not on file    Attends Engineer, structural: Not on file    Marital Status: Married   Family History  Problem Relation Age of Onset   Hypertension Mother    Squamous cell carcinoma Father    Cancer Father    Diabetes Father    Diabetes Sister    Colon polyps Neg Hx    Colon cancer Neg Hx    Esophageal cancer Neg Hx    Stomach cancer Neg Hx    Rectal cancer Neg Hx    Breast cancer Neg Hx    BRCA 1/2 Neg Hx    Allergies  Allergen Reactions   Erythromycin Other (See Comments)    Stomach pain   B-Complex-B-12 [B Complex Vitamins] Itching and Rash    Itchy skin   Cefuroxime Other (See Comments)   Tape Rash   Current Outpatient Medications  Medication Sig Dispense Refill   methylPREDNISolone  (MEDROL  DOSEPAK) 4 MG TBPK tablet Take 6 tablets by mouth on day 1, 5 tabs on day 2, 4 tabs on day 3, 3 tabs on day 4, 2 tabs on day 5, 1 tab on day 6. Then stop. 21 tablet 0   albuterol  (VENTOLIN  HFA) 108 (90 Base) MCG/ACT inhaler Inhale 1-2 puffs into the lungs every 6 (six) hours as needed for wheezing or shortness of breath. (Patient not taking: Reported on 06/28/2024) 6.7 g 1   amoxicillin  (AMOXIL ) 500 MG capsule Take 4 capsules (2,000 mg total) by mouth 1 hour prior to dental appointment (Patient not taking: Reported on 06/28/2024) 16 capsule 1   atorvastatin  (LIPITOR) 10 MG tablet Take 1 tablet (10 mg total) by mouth daily. 90 tablet 3   Blood Glucose Monitoring Suppl (FREESTYLE FREEDOM LITE) w/Device KIT Use as advised 1 kit 0   Continuous Glucose Sensor (FREESTYLE LIBRE 3 PLUS SENSOR) MISC Apply a new sensor every 15 days 6 each 3   cyanocobalamin  (VITAMIN B12) 1000 MCG tablet Take 1 tablet (1,000 mcg total) by mouth daily. 100 tablet 1   ferrous sulfate  325 (65 FE) MG EC tablet Take 325 mg by  mouth 3 (three) times daily with meals. (Patient not taking: Reported on 06/28/2024)     glucose blood (FREESTYLE LITE) test strip Use as directed twice daily for glucose testing 200 each 3   hydrocortisone -pramoxine (ANALPRAM -HC) 2.5-1 % rectal cream      influenza vac split quadrivalent PF (FLUARIX) 0.5 ML injection Inject into the muscle. (Patient not taking: Reported on 06/28/2024) 0.5 mL 0  Lancets (FREESTYLE) lancets UAD for bid blood glucose testing Dx: E11.65 100 each 12   meloxicam  (MOBIC ) 15 MG tablet Take 1 tablet (15 mg total) by mouth daily. 30 tablet 0   metFORMIN  (GLUCOPHAGE -XR) 750 MG 24 hr tablet Take 2 tablets (1,500 mg total) by mouth at bedtime. 180 tablet 3   Multiple Vitamin (MULTIVITAMIN PO) Take by mouth daily.     oxybutynin  (DITROPAN -XL) 10 MG 24 hr tablet Take 1 tablet (10 mg total) by mouth daily in the mid-morning 90 tablet 4   oxybutynin  (DITROPAN -XL) 10 MG 24 hr tablet Take 1 tablet (10 mg total) by mouth daily in the mid-morning. 90 tablet 4   Semaglutide  (RYBELSUS ) 14 MG TABS Take 1 tablet (14 mg total) by mouth daily. 90 tablet 1   Semaglutide ,0.25 or 0.5MG /DOS, (OZEMPIC , 0.25 OR 0.5 MG/DOSE,) 2 MG/3ML SOPN Inject 0.5 mg into the skin once a week. 9 mL 3   sertraline  (ZOLOFT ) 50 MG tablet Take 1 tablet (50 mg total) by mouth daily. 90 tablet 3   tiZANidine (ZANAFLEX) 4 MG tablet Take 1 tablet (4 mg total) by mouth every 8 (eight) hours as needed for muscle spasms. 30 tablet 0   traMADol  (ULTRAM ) 50 MG tablet Take 1 tablet (50 mg total) by mouth every 6 (six) hours as needed for up to 8 doses. 8 tablet 0   No current facility-administered medications for this visit.   DG Shoulder Right Result Date: 06/28/2024 CLINICAL DATA:  Neck and right shoulder pain. Lifted heavy things 8 days ago. EXAM: RIGHT SHOULDER - 2+ VIEW COMPARISON:  None Available. FINDINGS: Mild glenohumeral joint space narrowing. Mild inferior glenoid peripheral osteophytosis. Mild acromioclavicular  joint space narrowing and peripheral osteophytosis. No acute fracture or dislocation. The visualized portion of the right lung is unremarkable. IMPRESSION: Mild glenohumeral and acromioclavicular osteoarthritis. Electronically Signed   By: Tanda Lyons M.D.   On: 06/28/2024 17:45    Review of Systems:   A ROS was performed including pertinent positives and negatives as documented in the HPI.  Physical Exam :   Constitutional: NAD and appears stated age Neurological: Alert and oriented Psych: Appropriate affect and cooperative Last menstrual period 11/08/2013.   Comprehensive Musculoskeletal Exam:    No tenderness in the cervical spine or cervical paraspinal paraspinal musculature.  Full cervical range of motion in flexion and extension, slightly limited in bilateral rotation.  No pinpoint tenderness to palpation over the right shoulder.  Some muscular tenderness in the right scapular region.  Bilateral shoulder ROM to 160 degrees flexion, 80 degrees external rotation, and internal rotation to L1.  Grip strength 5/5 bilaterally.  Imaging:   Xray review from 06/28/2024 (right shoulder 3 views, cervical spine complete): Mild degenerative changes within the acromioclavicular and glenohumeral joints without acute abnormality.  Cervical spine radiographs demonstrate C6-C7 disc space narrowing with anterior osteophyte formation.  Mild retrolisthesis of L5 and L6.  No acute abnormality.   I personally reviewed and interpreted the radiographs.      Assessment & Plan Cervical radiculopathy   Pain radiates from her neck to right arm, consistent with cervical radiculopathy. X-rays reveal mild cervical spine changes. Prescribe Medrol  dose pack for symptom relief. Continue Tizanidine for pain relief and as a sleep aid. Recommend physical therapy for exercises and potential dry needling. Provide work note for rest and recovery.  Return to clinic as needed.  Mild shoulder arthritis   X-ray shows mild  arthritis with a small glenohumeral osteophyte and AC joint changes.  Current pain is more aligned with cervical radiculopathy. Monitor shoulder symptoms and address if she persists after cervical radiculopathy treatment.      I personally saw and evaluated the patient, and participated in the management and treatment plan.  Leonce Reveal, PA-C Orthopedics

## 2024-07-08 ENCOUNTER — Ambulatory Visit: Admitting: Dermatology

## 2024-07-12 ENCOUNTER — Encounter (HOSPITAL_BASED_OUTPATIENT_CLINIC_OR_DEPARTMENT_OTHER): Payer: Self-pay | Admitting: Student

## 2024-07-12 ENCOUNTER — Other Ambulatory Visit (HOSPITAL_BASED_OUTPATIENT_CLINIC_OR_DEPARTMENT_OTHER): Payer: Self-pay

## 2024-07-12 ENCOUNTER — Ambulatory Visit (HOSPITAL_BASED_OUTPATIENT_CLINIC_OR_DEPARTMENT_OTHER): Admitting: Student

## 2024-07-12 DIAGNOSIS — M5412 Radiculopathy, cervical region: Secondary | ICD-10-CM

## 2024-07-12 MED ORDER — TRAMADOL HCL 50 MG PO TABS
50.0000 mg | ORAL_TABLET | Freq: Four times a day (QID) | ORAL | 0 refills | Status: AC | PRN
  Filled 2024-07-12: qty 20, 5d supply, fill #0

## 2024-07-12 NOTE — Progress Notes (Signed)
 Chief Complaint: Neck and right shoulder pain    History of Present Illness  07/12/24: Patient presents to clinic today for follow-up of neck and right shoulder pain.  She was last seen almost 2 weeks ago and was started on a Medrol  Dosepak which she states gave her minimal relief.  Patient notices increased pain when letting her right arm dangle.  Pain does radiate from the neck to the shoulder blade and down the right upper arm and forearm.  She did recently get back from a cruise to [place] and trialed acupuncture while on the ship.   06/30/24: Veronica Rogers is a 63 year old female who presents with right shoulder and arm pain. She experiences severe pain in her right shoulder and arm, extending to the forearm and occasionally affecting the middle finger. The pain is deep and aching, with burning or tingling sensations. It intensifies when holding objects, as evidenced during an x-ray. The pain began after a beach trip approximately a week and a half ago and has persisted. She uses heat and ice for temporary relief and takes Tizanidine  and Meloxicam . 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 Lifting her arm overhead provides some relief. There is no numbness or tingling, and neck movement does not exacerbate the pain.   Surgical History:   None  PMH/PSH/Family History/Social History/Meds/Allergies:    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   Left post thigh - needs Immuno Mohs (scheduled 03/08/2024)   Personal history of chemotherapy    Personal history of radiation therapy    Radiation 03/28/14-05/06/14   Left Breast  60 Gy   Past Surgical History:  Procedure Laterality Date   BREAST BIOPSY Right 12/30/1994   breast, benign   BREAST LUMPECTOMY Left 11/2013   BREAST LUMPECTOMY WITH NEEDLE LOCALIZATION AND AXILLARY SENTINEL LYMPH NODE BX Left 10/29/2013   Procedure: BREAST LUMPECTOMY WITH NEEDLE LOCALIZATION AND AXILLARY SENTINEL LYMPH NODE BX;  Surgeon: Donnice Bury, MD;  Location: [facility];  Service: General;  Laterality: Left;   COLONOSCOPY  05/31/2011   DJ-F/V movi(good)-F/V-normal   TOTAL KNEE ARTHROPLASTY Right 09/11/2021   Procedure: TOTAL KNEE ARTHROPLASTY;  Surgeon: Ernie Donnice, MD;  Location: [facility];  Service: Orthopedics;  Laterality: Right;   WISDOM TOOTH EXTRACTION     Social History   Socioeconomic History   Marital status: Married    Spouse name: Not on file   Number of children: Not on file   Years of education: Not on file   Highest education level: Some college, no degree  Occupational History   Not on file  Tobacco Use   Smoking status: Never   Smokeless tobacco: Never  Vaping Use   Vaping status: Never Used  Substance and Sexual Activity   Alcohol use: Yes    Comment: rarely   Drug use: No   Sexual activity: Not on file  Other Topics Concern  Not on file  Social History Narrative   Not on file   Social Drivers of Health   Financial Resource Strain: Low Risk  (01/28/2024)   Overall Financial Resource Strain (CARDIA)    Difficulty of Paying Living Expenses: Not very hard  Food Insecurity: No Food Insecurity (04/10/2024)   Hunger Vital Sign    Worried About Running Out of Food in the Last Year: Never true    Ran Out of Food in the Last Year: Never true  Transportation Needs: No Transportation Needs (04/10/2024)   PRAPARE - Administrator, Civil Service (Medical): No    Lack of Transportation (Non-Medical): No  Physical Activity: Insufficiently Active (01/28/2024)   Exercise Vital Sign    Days of Exercise per Week: 3 days     Minutes of Exercise per Session: 10 min  Stress: No Stress Concern Present (01/28/2024)   [employer] of Occupational Health - Occupational Stress Questionnaire    Feeling of Stress : Only a little  Social Connections: Unknown (01/28/2024)   Social Connection and Isolation Panel    Frequency of Communication with Friends and Family: More than three times a week    Frequency of Social Gatherings with Friends and Family: Twice a week    Attends Religious Services: More than 4 times per year    Active Member of [employer] or Organizations: Not on file    Attends Engineer, structural: Not on file    Marital Status: Married   Family History  Problem Relation Age of Onset   Hypertension Mother    Squamous cell carcinoma Father    Cancer Father    Diabetes Father    Diabetes Sister    Colon polyps Neg Hx    Colon cancer Neg Hx    Esophageal cancer Neg Hx    Stomach cancer Neg Hx    Rectal cancer Neg Hx    Breast cancer Neg Hx    BRCA 1/2 Neg Hx    Allergies  Allergen Reactions   Erythromycin Other (See Comments)    Stomach pain   B-Complex-B-12 [B Complex Vitamins] Itching and Rash    Itchy skin   Cefuroxime Other (See Comments)   Tape Rash   Current Outpatient Medications  Medication Sig Dispense Refill   traMADol  (ULTRAM ) 50 MG tablet Take 1 tablet (50 mg total) by mouth every 6 (six) hours as needed for up to 5 days. 20 tablet 0   albuterol  (VENTOLIN  HFA) 108 (90 Base) MCG/ACT inhaler Inhale 1-2 puffs into the lungs every 6 (six) hours as needed for wheezing or shortness of breath. (Patient not taking: Reported on 06/28/2024) 6.7 g 1   amoxicillin  (AMOXIL ) 500 MG capsule Take 4 capsules (2,000 mg total) by mouth 1 hour prior to dental appointment (Patient not taking: Reported on 06/28/2024) 16 capsule 1   atorvastatin  (LIPITOR) 10 MG tablet Take 1 tablet (10 mg total) by mouth daily. 90 tablet 3   Blood Glucose Monitoring Suppl (FREESTYLE FREEDOM LITE) w/Device KIT Use as  advised 1 kit 0   Continuous Glucose Sensor (FREESTYLE LIBRE 3 PLUS SENSOR) MISC Apply a new sensor every 15 days 6 each 3   cyanocobalamin  (VITAMIN B12) 1000 MCG tablet Take 1 tablet (1,000 mcg total) by mouth daily. 100 tablet 1   ferrous sulfate  325 (65 FE) MG EC tablet Take 325 mg by mouth 3 (three) times daily with meals. (Patient not taking: Reported on 06/28/2024)     glucose blood (  FREESTYLE LITE) test strip Use as directed twice daily for glucose testing 200 each 3   hydrocortisone -pramoxine (ANALPRAM -HC) 2.5-1 % rectal cream      influenza vac split quadrivalent PF (FLUARIX) 0.5 ML injection Inject into the muscle. (Patient not taking: Reported on 06/28/2024) 0.5 mL 0   Lancets (FREESTYLE) lancets UAD for bid blood glucose testing Dx: E11.65 100 each 12   meloxicam  (MOBIC ) 15 MG tablet Take 1 tablet (15 mg total) by mouth daily. 30 tablet 0   metFORMIN  (GLUCOPHAGE -XR) 750 MG 24 hr tablet Take 2 tablets (1,500 mg total) by mouth at bedtime. 180 tablet 3   methylPREDNISolone  (MEDROL  DOSEPAK) 4 MG TBPK tablet Take 6 tablets by mouth on day 1, 5 tabs on day 2, 4 tabs on day 3, 3 tabs on day 4, 2 tabs on day 5, 1 tab on day 6. Then stop. 21 tablet 0   Multiple Vitamin (MULTIVITAMIN PO) Take by mouth daily.     oxybutynin  (DITROPAN -XL) 10 MG 24 hr tablet Take 1 tablet (10 mg total) by mouth daily in the mid-morning 90 tablet 4   oxybutynin  (DITROPAN -XL) 10 MG 24 hr tablet Take 1 tablet (10 mg total) by mouth daily in the mid-morning. 90 tablet 4   Semaglutide  (RYBELSUS ) 14 MG TABS Take 1 tablet (14 mg total) by mouth daily. 90 tablet 1   Semaglutide ,0.25 or 0.5MG /DOS, (OZEMPIC , 0.25 OR 0.5 MG/DOSE,) 2 MG/3ML SOPN Inject 0.5 mg into the skin once a week. 9 mL 3   sertraline  (ZOLOFT ) 50 MG tablet Take 1 tablet (50 mg total) by mouth daily. 90 tablet 3   tiZANidine  (ZANAFLEX ) 4 MG tablet Take 1 tablet (4 mg total) by mouth every 8 (eight) hours as needed for muscle spasms. 30 tablet 0   No current  facility-administered medications for this visit.   No results found.   Review of Systems:   A ROS was performed including pertinent positives and negatives as documented in the HPI.  Physical Exam :   Constitutional: NAD and appears stated age Neurological: Alert and oriented Psych: Appropriate affect and cooperative Last menstrual period 11/08/2013.   Comprehensive Musculoskeletal Exam:    No pain is elicited with palpation over the cervical midline.  There is tenderness in bilateral upper trapezius extending to the base of the neck.  Cervical range of motion with flexion, extension, and rotation bilaterally does not cause discomfort.  Tenderness in the lateral and posterior shoulder with radiation to the lateral upper arm and forearm.  Grip strength well-maintained.  Imaging:        Assessment & Plan Cervical radiculopathy   Patient continues to demonstrate neck and right upper extremity pain that  does continue to present consistent with cervical radiculopathy.  No neurological signs present suggestive of myelopathy.  She has trialed a steroid taper without much relief as well as prior use of meloxicam  and tizanidine .  She is set to begin working with physical therapy tomorrow but is worried about having to miss work.  I do think physical therapy will be extremely beneficial so I would like her to continue proceeding with this.  Recommended cervical traction pillow for symptomatic relief.  I will provide her prescription for tramadol  to take as needed to help manage moderate to severe pain.  Should symptoms not improve over the next few weeks with conservative management, will likely need to proceed with MRI for further investigation.      I personally saw and evaluated the patient, and participated  in the management and treatment plan.  Leonce Reveal, PA-C Orthopedics

## 2024-07-13 ENCOUNTER — Ambulatory Visit: Admitting: Internal Medicine

## 2024-07-15 ENCOUNTER — Ambulatory Visit: Admitting: Dermatology

## 2024-07-15 ENCOUNTER — Ambulatory Visit (HOSPITAL_BASED_OUTPATIENT_CLINIC_OR_DEPARTMENT_OTHER): Admitting: Student

## 2024-07-23 ENCOUNTER — Encounter (HOSPITAL_BASED_OUTPATIENT_CLINIC_OR_DEPARTMENT_OTHER): Payer: Self-pay | Admitting: Physician Assistant

## 2024-07-23 ENCOUNTER — Ambulatory Visit (HOSPITAL_BASED_OUTPATIENT_CLINIC_OR_DEPARTMENT_OTHER): Admitting: Physician Assistant

## 2024-07-23 DIAGNOSIS — M25511 Pain in right shoulder: Secondary | ICD-10-CM | POA: Diagnosis not present

## 2024-07-23 MED ORDER — METHYLPREDNISOLONE ACETATE 40 MG/ML IJ SUSP
40.0000 mg | INTRAMUSCULAR | Status: AC | PRN
  Administered 2024-07-23: 40 mg via INTRA_ARTICULAR

## 2024-07-23 MED ORDER — LIDOCAINE HCL 1 % IJ SOLN
4.0000 mL | INTRAMUSCULAR | Status: AC | PRN
  Administered 2024-07-23: 4 mL

## 2024-07-23 NOTE — Progress Notes (Signed)
 Office Visit Note   Patient: Veronica Rogers           Date of Birth: [birth year]           MRN: [id number] Visit Date: 07/23/2024              Requested by: Berneta Elsie Sayre, MD [address] [place],  [place] [address] PCP: Berneta Elsie Sayre, MD  Chief Complaint  Patient presents with  . Right Shoulder - Pain      HPI: Patient is a pleasant 63 year old woman who comes in for follow-up on her on her neck and shoulder pain.  She has been seeing Leonce.  She focuses more on pain in her shoulder going down her arm.  She denies any new injury.  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  She has been having a lot of trouble sleeping.  Denies any paresthesias in her right arm today.  Assessment & Plan: Visit Diagnoses: Right shoulder pain  Plan: Certainly has findings consistent both with cervical radiculopathy but I think today may be more shoulder and rotator cuff tendinitis.  She did have a Medrol  Dosepak it did not seem to help her for very long.  I think I am going to try a steroid injection into her right shoulder today she has had those in the past and done well.  This would also define diagnostically if more of the pain was coming from her shoulder or her neck.  If she gets good relief that would be fine if she still has significant symptoms in the back I would recommend an MRI of her cervical spine she has had x-rays of both the shoulder and the cervical spine and certainly she has more arthritic changes in the spine and the shoulder  Follow-Up Instructions: Return if symptoms worsen or fail to improve.   Ortho Exam  Patient is alert, oriented, no adenopathy, well-dressed, normal affect, normal respiratory effort. Examination of her right shoulder she has full range of motion.  She does have positive empty can testing and if she is tender over the acromioclavicular joint.  Strength is intact with resisted  external/internal rotation and abduction   Imaging: No results found. No images are attached to the encounter.  Labs: Lab Results  Component Value Date   HGBA1C 6.9 (A) 04/15/2024   HGBA1C 7.2 (A) 10/14/2023   HGBA1C 7.5 (H) 08/04/2023   LABORGA No Salmonella,Shigella,Campylobacter,Yersinia,or 02/20/2015   LABORGA No E.coli 0157:H7 isolated. 02/20/2015     Lab Results  Component Value Date   ALBUMIN 4.4 08/04/2023   ALBUMIN 4.4 09/02/2022   ALBUMIN 4.5 06/28/2022    No results found for: MG No results found for: VD25OH  No results found for: PREALBUMIN    Latest Ref Rng & Units 08/04/2023    1:40 PM 09/02/2022   11:35 AM 06/28/2022    1:35 PM  CBC EXTENDED  WBC 4.0 - 10.5 K/uL 8.3  9.0  7.2   RBC 3.87 - 5.11 Mil/uL 4.53  4.48  4.54   Hemoglobin 12.0 - 15.0 g/dL 87.8  87.7  87.9   HCT 36.0 - 46.0 % 37.7  37.7  37.6   Platelets 150.0 - 400.0 K/uL 210.0  212  202.0   NEUT# 1.4 - 7.7 K/uL 4.5  5.7    Lymph# 0.7 - 4.0 K/uL 3.3  2.8       There is no height or weight on  file to calculate BMI.  Orders:  No orders of the defined types were placed in this encounter.  No orders of the defined types were placed in this encounter.    Procedures: Large Joint Inj: R subacromial bursa on 07/23/2024 3:55 PM Indications: diagnostic evaluation and pain Details: 1.5 in posterior approach  Arthrogram: No  Medications: 4 mL lidocaine  1 %; 40 mg methylPREDNISolone  acetate 40 MG/ML Outcome: tolerated well, no immediate complications Procedure, treatment alternatives, risks and benefits explained, specific risks discussed. Consent was given by the patient. Immediately prior to procedure a time out was called to verify the correct patient, procedure, equipment, support staff and site/side marked as required. Patient was prepped and draped in the usual sterile fashion.     Clinical Data: No additional findings.  ROS:  All other systems negative, except as noted in the  HPI. Review of Systems  Objective: Vital Signs: LMP 11/08/2013   Specialty Comments:  No specialty comments available.  PMFS History: Patient Active Problem List   Diagnosis Date Noted  . Pain in right shoulder 07/23/2024  . Strain of right shoulder 01/29/2024  . B12 deficiency 08/04/2023  . Microcytic anemia 06/28/2022  . Atypical nevus 06/28/2022  . Arthralgia of right temporomandibular joint 02/15/2022  . Need for SBE (subacute bacterial endocarditis) prophylaxis 02/15/2022  . S/P total knee arthroplasty, right 09/11/2021  . Pre-op evaluation 07/17/2021  . Wound of abdomen 05/04/2021  . Carpal tunnel syndrome of left wrist 10/13/2020  . Chronic pain of right knee 10/13/2020  . Healthcare maintenance 09/13/2019  . OA (osteoarthritis) of knee 11/11/2018  . Hyperlipidemia 08/26/2017  . Type 2 diabetes mellitus with hyperglycemia, without long-term current use of insulin  (HCC) 01/08/2016  . Allergic rhinitis 11/22/2014  . 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HCC) 10/14/2013   Past Medical History:  Diagnosis Date  . Arthritis    generalixed  . Breast cancer (HCC)   . Contact lens/glasses fitting    wears contacts or glasses  . Diabetes (HCC) 11/22/2014   type 2- on meds  . GERD (gastroesophageal reflux disease)    hx of  . Hyperlipidemia   . 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 Personal history of chemotherapy   . Personal history of radiation therapy   . Radiation 03/28/14-05/06/14   Left Breast 60 Gy    Family History  Problem Relation Age of Onset  . Hypertension Mother   . Squamous cell carcinoma Father   . Cancer Father   . Diabetes Father   . Diabetes Sister   . Colon polyps Neg Hx   . Colon cancer Neg Hx   . Esophageal cancer Neg Hx   . Stomach cancer Neg Hx   . Rectal cancer Neg Hx   . Breast cancer Neg Hx   . BRCA 1/2 Neg Hx     Past Surgical History:  Procedure  Laterality Date  . BREAST BIOPSY Right 12/30/1994   breast, benign  . BREAST LUMPECTOMY Left 11/2013  . BREAST LUMPECTOMY WITH NEEDLE LOCALIZATION AND AXILLARY SENTINEL LYMPH NODE BX Left 10/29/2013   Procedure: BREAST LUMPECTOMY WITH NEEDLE LOCALIZATION AND AXILLARY SENTINEL LYMPH NODE BX;  Surgeon: Donnice Bury, MD;  Location: [facility];  Service: General;  Laterality: Left;  . COLONOSCOPY  05/31/2011   DJ-F/V movi(good)-F/V-normal  . TOTAL KNEE ARTHROPLASTY Right 09/11/2021   Procedure: TOTAL KNEE ARTHROPLASTY;  Surgeon: Ernie Donnice, MD;  Location: [facility];  Service: Orthopedics;  Laterality: Right;  . WISDOM TOOTH EXTRACTION     Social History   Occupational History  . Not on file  Tobacco Use  . Smoking status: Never  . Smokeless tobacco: Never  Vaping Use  . Vaping status: Never Used  Substance and Sexual Activity  . Alcohol use: Yes    Comment: rarely  . Drug use: No  . Sexual activity: Not on file

## 2024-07-28 ENCOUNTER — Encounter (HOSPITAL_BASED_OUTPATIENT_CLINIC_OR_DEPARTMENT_OTHER): Payer: Self-pay

## 2024-08-05 ENCOUNTER — Other Ambulatory Visit: Payer: Self-pay | Admitting: Internal Medicine

## 2024-08-05 ENCOUNTER — Other Ambulatory Visit (HOSPITAL_COMMUNITY): Payer: Self-pay

## 2024-08-05 DIAGNOSIS — E1165 Type 2 diabetes mellitus with hyperglycemia: Secondary | ICD-10-CM

## 2024-08-05 MED ORDER — FREESTYLE LITE TEST VI STRP
ORAL_STRIP | 3 refills | Status: AC
Start: 2024-08-05 — End: ?
  Filled 2024-08-05: qty 100, 50d supply, fill #0

## 2024-08-10 ENCOUNTER — Encounter: Payer: Commercial Managed Care - PPO | Admitting: Family Medicine

## 2024-08-12 ENCOUNTER — Ambulatory Visit: Admitting: Family Medicine

## 2024-08-12 ENCOUNTER — Encounter: Payer: Self-pay | Admitting: Family Medicine

## 2024-08-12 VITALS — BP 126/76 | HR 91 | Temp 96.7°F | Ht 67.0 in | Wt 184.4 lb

## 2024-08-12 DIAGNOSIS — Z23 Encounter for immunization: Secondary | ICD-10-CM | POA: Diagnosis not present

## 2024-08-12 DIAGNOSIS — E538 Deficiency of other specified B group vitamins: Secondary | ICD-10-CM

## 2024-08-12 DIAGNOSIS — Z0000 Encounter for general adult medical examination without abnormal findings: Secondary | ICD-10-CM | POA: Diagnosis not present

## 2024-08-12 DIAGNOSIS — T300 Burn of unspecified body region, unspecified degree: Secondary | ICD-10-CM | POA: Insufficient documentation

## 2024-08-12 DIAGNOSIS — E782 Mixed hyperlipidemia: Secondary | ICD-10-CM
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
## 2024-08-13 LAB — COMPREHENSIVE METABOLIC PANEL WITH GFR
ALT: 15 U/L (ref 0–35)
AST: 15 U/L (ref 0–37)
Albumin: 4.3 g/dL (ref 3.5–5.2)
Alkaline Phosphatase: 42 U/L (ref 39–117)
BUN: 15 mg/dL (ref 6–23)
CO2: 30 meq/L (ref 19–32)
Calcium: 9.2 mg/dL (ref 8.4–10.5)
Chloride: 101 meq/L (ref 96–112)
Creatinine, Ser: 0.63 mg/dL (ref 0.40–1.20)
GFR: 94.1 mL/min (ref >60.00)
Glucose, Bld: 93 mg/dL (ref 70–99)
Potassium: 3.8 meq/L (ref 3.5–5.1)
Sodium: 140 meq/L (ref 135–145)
Total Bilirubin: 0.8 mg/dL (ref 0.2–1.2)
Total Protein: 6.8 g/dL (ref 6.0–8.3)

## 2024-08-13 LAB — URINALYSIS, ROUTINE W REFLEX MICROSCOPIC
Bilirubin Urine: NEGATIVE
Hgb urine dipstick: NEGATIVE
Ketones, ur: NEGATIVE
Nitrite: NEGATIVE
RBC / HPF: NONE SEEN (ref 0–?)
Specific Gravity, Urine: 1.025 (ref 1.000–1.030)
Total Protein, Urine: NEGATIVE
Urine Glucose: NEGATIVE
Urobilinogen, UA: 0.2 (ref 0.0–1.0)
pH: 6 (ref 5.0–8.0)

## 2024-08-13 LAB — LIPID PANEL
Cholesterol: 176 mg/dL (ref 0–200)
HDL: 58.9 mg/dL (ref >39.00)
LDL Cholesterol: 88 mg/dL (ref 0–99)
NonHDL: 117.5
Total CHOL/HDL Ratio: 3
Triglycerides: 148 mg/dL (ref 0.0–149.0)
VLDL: 29.6 mg/dL (ref 0.0–40.0)

## 2024-08-13 LAB — CBC WITH DIFFERENTIAL/PLATELET
Basophils Absolute: 0.1 K/uL (ref 0.0–0.1)
Basophils Relative: 0.7 % (ref 0.0–3.0)
Eosinophils Absolute: 0.1 K/uL (ref 0.0–0.7)
Eosinophils Relative: 1.2 % (ref 0.0–5.0)
HCT: 39 % (ref 36.0–46.0)
Hemoglobin: 12.6 g/dL (ref 12.0–15.0)
Lymphocytes Relative: 31.5 % (ref 12.0–46.0)
Lymphs Abs: 2.7 K/uL (ref 0.7–4.0)
MCHC: 32.3 g/dL (ref 30.0–36.0)
MCV: 82.6 fl (ref 78.0–100.0)
Monocytes Absolute: 0.4 K/uL (ref 0.1–1.0)
Monocytes Relative: 5.1 % (ref 3.0–12.0)
Neutro Abs: 5.4 K/uL (ref 1.4–7.7)
Neutrophils Relative %: 61.5 % (ref 43.0–77.0)
Platelets: 253 K/uL (ref 150.0–400.0)
RBC: 4.72 Mil/uL (ref 3.87–5.11)
RDW: 14.2 % (ref 11.5–15.5)
WBC: 8.7 K/uL (ref 4.0–10.5)

## 2024-08-13 LAB — VITAMIN B12: Vitamin B-12: 302 pg/mL (ref 211–911)

## 2024-08-16 ENCOUNTER — Other Ambulatory Visit (HOSPITAL_COMMUNITY): Payer: Self-pay

## 2024-08-16 ENCOUNTER — Ambulatory Visit: Payer: Self-pay | Admitting: Family Medicine

## 2024-08-16 ENCOUNTER — Other Ambulatory Visit: Payer: Self-pay

## 2024-08-16 MED ORDER — VITAMIN B-12 1000 MCG PO TABS
1000.0000 ug | ORAL_TABLET | Freq: Every day | ORAL | 2 refills | Status: AC
Start: 2024-08-16 — End: ?
  Filled 2024-08-16: qty 100, 100d supply, fill #0
  Filled 2024-12-17: qty 100, 100d supply, fill #1

## 2024-08-16 MED ORDER — ATORVASTATIN CALCIUM 20 MG PO TABS
20.0000 mg | ORAL_TABLET | Freq: Every day | ORAL | 3 refills | Status: AC
  Filled 2024-08-16: qty 90, 90d supply, fill #0
  Filled 2024-12-17: qty 90, 90d supply, fill #1

## 2024-08-16 NOTE — Addendum Note (Signed)
 Addended by: BERNETA ELSIE LABOR on: 08/16/2024 10:28 AM   Modules accepted: Orders

## 2024-08-23 ENCOUNTER — Other Ambulatory Visit (HOSPITAL_COMMUNITY): Payer: Self-pay

## 2024-08-23 ENCOUNTER — Other Ambulatory Visit: Payer: Self-pay | Admitting: Family Medicine

## 2024-08-23 DIAGNOSIS — J209 Acute bronchitis, unspecified: Secondary | ICD-10-CM

## 2024-08-24 ENCOUNTER — Other Ambulatory Visit (HOSPITAL_COMMUNITY): Payer: Self-pay

## 2024-08-24 MED ORDER — ALBUTEROL SULFATE HFA 108 (90 BASE) MCG/ACT IN AERS
1.0000 | INHALATION_SPRAY | Freq: Four times a day (QID) | RESPIRATORY_TRACT | 1 refills | Status: AC | PRN
  Filled 2024-08-24: qty 6.7, 25d supply, fill #0
  Filled 2024-12-17: qty 6.7, 25d supply, fill #1

## 2024-08-26 ENCOUNTER — Ambulatory Visit: Admitting: Dermatology

## 2024-09-17 ENCOUNTER — Other Ambulatory Visit (HOSPITAL_COMMUNITY): Payer: Self-pay

## 2024-09-17 ENCOUNTER — Encounter: Payer: Self-pay | Admitting: Family Medicine

## 2024-09-17 ENCOUNTER — Ambulatory Visit: Admitting: Family Medicine

## 2024-09-17 VITALS — BP 118/72 | HR 78 | Temp 96.8°F | Ht 67.0 in | Wt 186.0 lb

## 2024-09-17 DIAGNOSIS — J4520 Mild intermittent asthma, uncomplicated: Secondary | ICD-10-CM

## 2024-09-17 DIAGNOSIS — J301 Allergic rhinitis due to pollen: Secondary | ICD-10-CM | POA: Diagnosis not present

## 2024-09-17 DIAGNOSIS — R058 Other specified cough: Secondary | ICD-10-CM | POA: Diagnosis not present

## 2024-09-17 MED ORDER — HYDROCODONE BIT-HOMATROP MBR 5-1.5 MG/5ML PO SOLN
5.0000 mL | Freq: Four times a day (QID) | ORAL | 0 refills | Status: DC | PRN
  Filled 2024-09-17: qty 120, 6d supply, fill #0

## 2024-09-17 MED ORDER — PREDNISONE 20 MG PO TABS
20.0000 mg | ORAL_TABLET | Freq: Two times a day (BID) | ORAL | 0 refills | Status: AC
  Filled 2024-09-17: qty 14, 7d supply, fill #0

## 2024-09-17 MED ORDER — FLUTICASONE PROPIONATE 50 MCG/ACT NA SUSP
2.0000 | Freq: Every day | NASAL | 6 refills | Status: AC
  Filled 2024-09-17: qty 16, 30d supply, fill #0

## 2024-09-17 NOTE — Progress Notes (Signed)
 Established Patient Office Visit   Subjective:  Patient ID: Veronica Rogers, female    DOB: 1961/01/18  Age: 63 y.o. MRN: [id number]  Chief Complaint  Patient presents with   Cough    Cough Associated symptoms include wheezing. Pertinent negatives include no eye redness, myalgias, rash or shortness of breath.   Encounter Diagnoses  Name Primary?   Seasonal allergic rhinitis due to pollen Yes   Other cough    Mild intermittent reactive airway disease without complication    1 month history of ongoing cough that is occasionally productive of whitish phlegm.  There has been some wheezing with no difficulty breathing.  Ongoing postnasal drip and rhinorrhea.  Occasional sneeze and itchy ears.  Cough is worse in the evening.  She does not smoke.  No facial pressure or teeth pain.  No fevers or chills.  No significant reflux.   Review of Systems  Constitutional: Negative.   HENT: Negative.  Negative for sinus pain.   Eyes:  Negative for blurred vision, discharge and redness.  Respiratory:  Positive for cough and wheezing. Negative for shortness of breath.   Cardiovascular: Negative.   Gastrointestinal:  Negative for abdominal pain.  Genitourinary: Negative.   Musculoskeletal: Negative.  Negative for myalgias.  Skin:  Negative for rash.  Neurological:  Negative for tingling, loss of consciousness and weakness.  Endo/Heme/Allergies:  Negative for polydipsia.     Current Outpatient Medications:    albuterol  (VENTOLIN  HFA) 108 (90 Base) MCG/ACT inhaler, Inhale 1-2 puffs into the lungs every 6 (six) hours as needed for wheezing or shortness of breath., Disp: 6.7 g, Rfl: 1   atorvastatin  (LIPITOR) 20 MG tablet, Take 1 tablet (20 mg total) by mouth daily., Disp: 90 tablet, Rfl: 3   Blood Glucose Monitoring Suppl (FREESTYLE FREEDOM LITE) w/Device KIT, Use as advised, Disp: 1 kit, Rfl: 0   Continuous Glucose Sensor (FREESTYLE LIBRE 3 PLUS SENSOR) MISC, Apply a new sensor every 15 days,  Disp: 6 each, Rfl: 3   cyanocobalamin  (VITAMIN B12) 1000 MCG tablet, Take 1 tablet (1,000 mcg total) by mouth daily., Disp: 100 tablet, Rfl: 2   fluticasone  (FLONASE ) 50 MCG/ACT nasal spray, Place 2 sprays into both nostrils daily., Disp: 16 g, Rfl: 6   glucose blood (FREESTYLE LITE) test strip, Use as directed twice daily for glucose testing, Disp: 200 each, Rfl: 3   HYDROcodone  bit-homatropine (HYDROMET) 5-1.5 MG/5ML syrup, Take 5 mLs by mouth every 6 (six) hours as needed for cough. Drowsy precautions, Disp: 120 mL, Rfl: 0   hydrocortisone -pramoxine (ANALPRAM -HC) 2.5-1 % rectal cream, , Disp: , Rfl:    Lancets (FREESTYLE) lancets, UAD for bid blood glucose testing Dx: E11.65, Disp: 100 each, Rfl: 12   meloxicam  (MOBIC ) 15 MG tablet, Take 1 tablet (15 mg total) by mouth daily., Disp: 30 tablet, Rfl: 0   metFORMIN  (GLUCOPHAGE -XR) 750 MG 24 hr tablet, Take 2 tablets (1,500 mg total) by mouth at bedtime., Disp: 180 tablet, Rfl: 3   Multiple Vitamin (MULTIVITAMIN PO), Take by mouth daily., Disp: , Rfl:    oxybutynin  (DITROPAN -XL) 10 MG 24 hr tablet, Take 1 tablet (10 mg total) by mouth daily in the mid-morning, Disp: 90 tablet, Rfl: 4   oxybutynin  (DITROPAN -XL) 10 MG 24 hr tablet, Take 1 tablet (10 mg total) by mouth daily in the mid-morning., Disp: 90 tablet, Rfl: 4   predniSONE  (DELTASONE ) 20 MG tablet, Take 1 tablet (20 mg total) by mouth 2 (two) times daily with a meal for  7 days., Disp: 14 tablet, Rfl: 0   Semaglutide  (RYBELSUS ) 14 MG TABS, Take 1 tablet (14 mg total) by mouth daily., Disp: 90 tablet, Rfl: 1   sertraline  (ZOLOFT ) 50 MG tablet, Take 1 tablet (50 mg total) by mouth daily., Disp: 90 tablet, Rfl: 3   tiZANidine  (ZANAFLEX ) 4 MG tablet, Take 1 tablet (4 mg total) by mouth every 8 (eight) hours as needed for muscle spasms., Disp: 30 tablet, Rfl: 0   Objective:     BP 118/72 (BP Location: Right Arm, Patient Position: Sitting, Cuff Size: Normal)   Pulse 78   Temp (!) 96.8 F (36 C)  (Temporal)   Ht 5' 7 (1.702 m)   Wt 186 lb (84.4 kg)   LMP 11/08/2013   SpO2 97%   BMI 29.13 kg/m    Physical Exam Constitutional:      General: She is not in acute distress.    Appearance: Normal appearance. She is not ill-appearing, toxic-appearing or diaphoretic.  HENT:     Head: Normocephalic and atraumatic.     Right Ear: Tympanic membrane, ear canal and external ear normal.     Left Ear: Tympanic membrane, ear canal and external ear normal.     Mouth/Throat:     Mouth: Mucous membranes are moist.     Pharynx: Oropharynx is clear. No oropharyngeal exudate or posterior oropharyngeal erythema.  Eyes:     General: No scleral icterus.       Right eye: No discharge.        Left eye: No discharge.     Extraocular Movements: Extraocular movements intact.     Conjunctiva/sclera: Conjunctivae normal.     Pupils: Pupils are equal, round, and reactive to light.  Cardiovascular:     Rate and Rhythm: Normal rate and regular rhythm.  Pulmonary:     Effort: Pulmonary effort is normal. No respiratory distress.     Breath sounds: Normal breath sounds. No wheezing or rales.  Musculoskeletal:     Cervical back: No rigidity or tenderness.  Lymphadenopathy:     Cervical: No cervical adenopathy.  Skin:    General: Skin is warm and dry.  Neurological:     Mental Status: She is alert and oriented to person, place, and time.  Psychiatric:        Mood and Affect: Mood normal.        Behavior: Behavior normal.      No results found for any visits on 09/17/24.    The ASCVD Risk score (Arnett DK, et al., 2019) failed to calculate for the following reasons:   Risk score cannot be calculated because patient has a medical history suggesting prior/existing ASCVD    Assessment & Plan:   Seasonal allergic rhinitis due to pollen -     Fluticasone  Propionate; Place 2 sprays into both nostrils daily.  Dispense: 16 g; Refill: 6  Other cough -     HYDROcodone  Bit-Homatrop MBr; Take 5 mLs  by mouth every 6 (six) hours as needed for cough. Drowsy precautions  Dispense: 120 mL; Refill: 0  Mild intermittent reactive airway disease without complication -     predniSONE ; Take 1 tablet (20 mg total) by mouth 2 (two) times daily with a meal for 7 days.  Dispense: 14 tablet; Refill: 0    Return if symptoms worsen or fail to improve.    Elsie Sim Lent, MD

## 2024-09-23 ENCOUNTER — Encounter (HOSPITAL_COMMUNITY): Payer: Self-pay

## 2024-09-23 ENCOUNTER — Other Ambulatory Visit: Payer: Self-pay | Admitting: Internal Medicine

## 2024-09-23 ENCOUNTER — Other Ambulatory Visit (HOSPITAL_COMMUNITY): Payer: Self-pay

## 2024-09-24 ENCOUNTER — Other Ambulatory Visit (HOSPITAL_COMMUNITY): Payer: Self-pay

## 2024-09-24 ENCOUNTER — Other Ambulatory Visit: Payer: Self-pay | Admitting: Internal Medicine

## 2024-09-24 ENCOUNTER — Telehealth: Payer: Self-pay

## 2024-09-24 MED ORDER — OZEMPIC (0.25 OR 0.5 MG/DOSE) 2 MG/3ML ~~LOC~~ SOPN
0.5000 mg | PEN_INJECTOR | SUBCUTANEOUS | 0 refills | Status: DC
  Filled 2024-09-24: qty 3, 28d supply, fill #0
  Filled 2024-10-14 – 2024-10-19 (×2): qty 3, 28d supply, fill #1
  Filled 2024-11-13: qty 3, 28d supply, fill #2

## 2024-09-24 NOTE — Telephone Encounter (Signed)
 Patient requesting Ozempic  and Rybelsus 

## 2024-09-24 NOTE — Telephone Encounter (Signed)
 Q, At last visit we tried to change from Rybelsus  to Ozempic .  I think I left Rybelsus  on just in case Ozempic  would not be covered for her.  She should not be on both.  I prefer that she uses Ozempic , but if this is not covered, she could go back to Rybelsus  and we can refill this.

## 2024-09-30 ENCOUNTER — Other Ambulatory Visit (HOSPITAL_COMMUNITY): Payer: Self-pay

## 2024-10-14 ENCOUNTER — Other Ambulatory Visit (HOSPITAL_COMMUNITY): Payer: Self-pay

## 2024-10-19 ENCOUNTER — Other Ambulatory Visit (HOSPITAL_COMMUNITY): Payer: Self-pay

## 2024-10-19 ENCOUNTER — Ambulatory Visit: Admitting: Internal Medicine

## 2024-10-19 ENCOUNTER — Encounter: Payer: Self-pay | Admitting: Internal Medicine

## 2024-10-19 VITALS — BP 136/64 | HR 65 | Ht 67.0 in | Wt 190.4 lb

## 2024-10-19 DIAGNOSIS — Z7984 Long term (current) use of oral hypoglycemic drugs: Secondary | ICD-10-CM | POA: Diagnosis not present

## 2024-10-19 DIAGNOSIS — E1165 Type 2 diabetes mellitus with hyperglycemia: Secondary | ICD-10-CM

## 2024-10-19 DIAGNOSIS — Z7985 Long-term (current) use of injectable non-insulin antidiabetic drugs: Secondary | ICD-10-CM | POA: Diagnosis not present

## 2024-10-19 DIAGNOSIS — E663 Overweight: Secondary | ICD-10-CM | POA: Diagnosis not present

## 2024-10-19 DIAGNOSIS — E785 Hyperlipidemia, unspecified: Secondary | ICD-10-CM | POA: Diagnosis not present

## 2024-10-19 LAB — POCT GLYCOSYLATED HEMOGLOBIN (HGB A1C): Hemoglobin A1C: 7.8 % — AB (ref 4.0–5.6)

## 2024-10-19 MED ORDER — FLUZONE 0.5 ML IM SUSY
0.5000 mL | PREFILLED_SYRINGE | Freq: Once | INTRAMUSCULAR | 0 refills | Status: AC
  Filled 2024-10-19: qty 0.5, 1d supply, fill #0

## 2024-10-19 MED ORDER — GLIPIZIDE 5 MG PO TABS
5.0000 mg | ORAL_TABLET | Freq: Every day | ORAL | 3 refills | Status: DC
  Filled 2024-10-19: qty 90, 90d supply, fill #0

## 2024-10-19 NOTE — Progress Notes (Signed)
 Patient ID: Veronica Rogers, female   DOB: [birth year], 63 y.o.   MRN: [id number]   HPI: Veronica Rogers is a 63 y.o.-year-old female, presenting presenting for f/u for DM2, dx in 2015 (after steroids), non-insulin -dependent, uncontrolled, without long term complications. Last visit 6 months ago.  Interim history: No increased urination, blurry vision, chest pain.   She was on steroids for arm pain and then had a steroid inj >> higher CBGs.  Reviewed HbA1c levels: Lab Results  Component Value Date   HGBA1C 6.9 (A) 04/15/2024   HGBA1C 7.2 (A) 10/14/2023   HGBA1C 7.5 (H) 08/04/2023   HGBA1C 6.8 (A) 04/03/2023   HGBA1C 6.9 (A) 12/03/2022   HGBA1C 7.4 (A) 08/01/2022   HGBA1C 7.1 (A) 03/28/2022   HGBA1C 6.3 (A) 10/26/2021   HGBA1C 6.7 (H) 08/02/2021   HGBA1C 6.9 (A) 05/08/2021   HGBA1C 7.2 (H) 10/13/2020   HGBA1C 7.9 (H) 09/13/2019   HGBA1C 6.7 (A) 01/08/2019   HGBA1C 7.2 (A) 09/03/2018   HGBA1C 7.4 04/24/2018   HGBA1C 6.9 11/28/2017   HGBA1C 7.2 08/26/2017   HGBA1C 7.1 05/19/2017   HGBA1C 7.1 01/14/2017   HGBA1C 7.0 10/11/2016  08/01/2022: HbA1c 7.4%  Pt is on a regimen of:  - Metformin  ER 1500 mg with dinner -  >> Rybelsus  7 >> 14 mg before breakfast >> Ozempic  0.5 mg weekly x starting this 3 weeks ago - Glipizide  5 mg before dinner >> Before a larger meal  She was on insulin  during ChTx. She was on regular metformin  >> GI upset (diarrhea). She has frequent urination.  Pt checks her sugars 1-3 times a day: - am:  15-155, 227 >> 121-130, 140 >> 119-167 - 2h after b'fast: 120 >> n/c >> 115 >> 159 >> n/c - before lunch: n/c >> 111, 138 >> n/c >> 80 >> n/c - 2h after lunch: n/c >> 138 >> n/c >> 194 >> n/c - before dinner: 145, 167 >> n/c >> 104-145 >> 109-207 - 2h after dinner: 160s >> n/c >> 189 >> 127 >> n/c - bedtime: n/c  >> 125-135 >> 96, 198 (pizza) >> n/c - nighttime:  116-120, 164 >> 122, 142 >> 107-165 Lowest sugar was 91 >> 110 >> 80 >> 107;  she has hypoglycemia  awareness in the 70s. Highest sugar was 149 >> 200 >> 200 >> 207.  Glucometer: True test >> AccuChek guide >> Freestyle Lite  Pt's meals are: - Breakfast: cereals or 2 eggs or skips >> cereals -- Lunch: leftovers from home: sandwich; soup; meat + veggies; chinese; salad - Dinner: eats out   - No CKD, last BUN/creatinine:  Lab Results  Component Value Date   BUN 15 08/12/2024   CREATININE 0.63 08/12/2024   Lab Results  Component Value Date   MICRALBCREAT 12 04/15/2024   -+HL; last set of lipids: Lab Results  Component Value Date   CHOL 176 08/12/2024   HDL 58.90 08/12/2024   LDLCALC 88 08/12/2024   LDLDIRECT 73 12/28/2021   TRIG 148.0 08/12/2024   CHOLHDL 3 08/12/2024  On Lipitor 10 mg daily.  - last eye exam was on 06/29/2024: No DR reportedly. Dr. Octavia.    -She denies numbness and tingling in her feet.  She has a history of plantar fasciitis.  Last foot exam 04/15/2024.  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 On Tamoxifen . She was found to have a low vitamin B12 on 06/28/2022: B12 139 - started on p.o. supplementation: 1000 mcg daily. On 09/02/2022 she was in  the emergency room for left hip pain and also nausea.  Of note, lipase was normal at 14.  ROS: + see HPI  I reviewed pt's medications, allergies, PMH, social hx, family hx, and changes were documented in the history of present illness. Otherwise, unchanged from my initial visit note.  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   Left post thigh - needs Immuno Mohs (scheduled 03/08/2024)   Personal history of chemotherapy    Personal history of radiation therapy    Radiation 03/28/14-05/06/14   Left Breast 60 Gy   Past Surgical History:  Procedure Laterality Date   BREAST  BIOPSY Right 12/30/1994   breast, benign   BREAST LUMPECTOMY Left 11/2013   BREAST LUMPECTOMY WITH NEEDLE LOCALIZATION AND AXILLARY SENTINEL LYMPH NODE BX Left 10/29/2013   Procedure: BREAST LUMPECTOMY WITH NEEDLE LOCALIZATION AND AXILLARY SENTINEL LYMPH NODE BX;  Surgeon: Donnice Bury, MD;  Location: [facility];  Service: General;  Laterality: Left;   COLONOSCOPY  05/31/2011   DJ-F/V movi(good)-F/V-normal   TOTAL KNEE ARTHROPLASTY Right 09/11/2021   Procedure: TOTAL KNEE ARTHROPLASTY;  Surgeon: Ernie Donnice, MD;  Location: [facility];  Service: Orthopedics;  Laterality: Right;   WISDOM TOOTH EXTRACTION     Social History   Social History   Marital Status: Married    Spouse Name: N/A   Number of Children: 2   Occupational History   [employer] - support rep   Social History Main Topics   Smoking status: Never Smoker    Smokeless tobacco: Never Used   Alcohol Use: Yes     Comment: 1-2 drinks a month   Drug Use: No   Current Outpatient Medications on File Prior to Visit  Medication Sig Dispense Refill   albuterol  (VENTOLIN  HFA) 108 (90 Base) MCG/ACT inhaler Inhale 1-2 puffs into the lungs every 6 (six) hours as needed for wheezing or shortness of breath. 6.7 g 1   atorvastatin  (LIPITOR) 20 MG tablet Take 1 tablet (20 mg total) by mouth daily. 90 tablet 3   Blood Glucose Monitoring Suppl (FREESTYLE FREEDOM LITE) w/Device KIT Use as advised 1 kit 0   Continuous Glucose Sensor (FREESTYLE LIBRE 3 PLUS SENSOR) MISC Apply a new sensor every 15 days 6 each 3   cyanocobalamin  (VITAMIN B12) 1000 MCG tablet Take 1 tablet (1,000 mcg total) by mouth daily. 100 tablet 2   fluticasone  (FLONASE ) 50 MCG/ACT nasal spray Place 2 sprays into both nostrils daily. 16 g 6   glucose blood (FREESTYLE LITE) test strip Use as directed twice daily for glucose testing 200 each 3   HYDROcodone  bit-homatropine (HYDROMET) 5-1.5 MG/5ML syrup Take 5 mLs by mouth every 6 (six)  hours as needed for cough. May cause drowsiness. 120 mL 0   hydrocortisone -pramoxine (ANALPRAM -HC) 2.5-1 % rectal cream      Lancets (FREESTYLE) lancets UAD for bid blood glucose testing Dx: E11.65 100 each 12   meloxicam  (MOBIC ) 15 MG tablet Take 1 tablet (15 mg total) by mouth daily. 30 tablet 0   metFORMIN  (GLUCOPHAGE -XR) 750 MG 24 hr tablet Take 2 tablets (1,500 mg total) by mouth at bedtime. 180 tablet  3   Multiple Vitamin (MULTIVITAMIN PO) Take by mouth daily.     oxybutynin  (DITROPAN -XL) 10 MG 24 hr tablet Take 1 tablet (10 mg total) by mouth daily in the mid-morning 90 tablet 4   oxybutynin  (DITROPAN -XL) 10 MG 24 hr tablet Take 1 tablet (10 mg total) by mouth daily in the mid-morning. 90 tablet 4   Semaglutide  (RYBELSUS ) 14 MG TABS Take 1 tablet (14 mg total) by mouth daily. 90 tablet 1   Semaglutide ,0.25 or 0.5MG /DOS, (OZEMPIC , 0.25 OR 0.5 MG/DOSE,) 2 MG/3ML SOPN Inject 0.5 mg into the skin once a week. 9 mL 0   sertraline  (ZOLOFT ) 50 MG tablet Take 1 tablet (50 mg total) by mouth daily. 90 tablet 3   tiZANidine  (ZANAFLEX ) 4 MG tablet Take 1 tablet (4 mg total) by mouth every 8 (eight) hours as needed for muscle spasms. 30 tablet 0   No current facility-administered medications on file prior to visit.   Allergies  Allergen Reactions   Erythromycin Other (See Comments)    Stomach pain   B-Complex-B-12 [B Complex Vitamins] Itching and Rash    Itchy skin   Cefuroxime Other (See Comments)   Tape Rash   Family History  Problem Relation Age of Onset   Hypertension Mother    Squamous cell carcinoma Father    Cancer Father    Diabetes Father    Diabetes Sister    Colon polyps Neg Hx    Colon cancer Neg Hx    Esophageal cancer Neg Hx    Stomach cancer Neg Hx    Rectal cancer Neg Hx    Breast cancer Neg Hx    BRCA 1/2 Neg Hx    PE: BP 136/64   Pulse 65   Ht 5' 7 (1.702 m)   Wt 190 lb 6.4 oz (86.4 kg)   LMP 11/08/2013   SpO2 95%   BMI 29.82 kg/m   Wt Readings from Last  10 Encounters:  10/19/24 190 lb 6.4 oz (86.4 kg)  09/17/24 186 lb (84.4 kg)  08/12/24 184 lb 6.4 oz (83.6 kg)  06/28/24 188 lb (85.3 kg)  04/15/24 190 lb 9.6 oz (86.5 kg)  02/17/24 188 lb 3.2 oz (85.4 kg)  01/29/24 187 lb (84.8 kg)  10/14/23 185 lb (83.9 kg)  10/02/23 182 lb 3.2 oz (82.6 kg)  08/04/23 186 lb 3.2 oz (84.5 kg)   Constitutional: overweight, in NAD Eyes:  EOMI, no exophthalmos ENT: no thyromegaly, no cervical lymphadenopathy Cardiovascular: RRR, No MRG Respiratory: CTA B Musculoskeletal: no deformities Skin: no rashes Neurological: no tremor with outstretched hands  ASSESSMENT: 1. DM2, non-insulin -dependent, uncontrolled, without long term complications, but with hyperglycemia  No FH of MTC or personal hx of pancreatitis.  2. HL  3.  Overweight  PLAN:  1. Patient with longstanding, uncontrolled, type 2 diabetes, on oral antidiabetic regimen with metformin  and sulfonylurea and now weekly injectable GLP-1 receptor agonist.  At last visit, I recommended change from Rybelsus  to Ozempic .  She only did this 3 weeks ago.  At last visit, she was not taking blood sugars consistently but the blood sugars that she actually did check were improved.  She was interested in Ozempic  and we discussed about correct injection techniques.  I advised her to take glipizide  only before larger meals and continued metformin .  I also recommended a CGM, which she agreed to try if covered by her insurance. - At today's visit, she only has few blood sugars in her log and some of  these are above target, particularly in the morning.  She mentions that she tried the sensors but did not like it.  At today's visit we discussed about the many benefits of wearing one and different options for placement.  She agrees to try this again.  She just started Ozempic  3 weeks ago so we discussed that for now, especially as she has some gas (no nausea or abdominal pain) we will continue the same dose.  Will also  continue the rest of the regimen for now.  At next visit, we will see if we need to increase the Ozempic  dose. - I suggested to:  Patient Instructions  Please continue: - Metformin  ER 1500 mg with dinner - Glipizide  5 mg before a larger meal - Ozempic  0.5 mg weekly  Try to restart the sensor.   Please return in 3-4 months with your sugar log.  - we checked her HbA1c: 7.8% (higher) - advised to check sugars at different times of the day - 4x a day, rotating check times - advised for yearly eye exams >> she is UTD - return to clinic in 3-4 months   2. HL - Latest lipid panel showed excellent improvement, with all fractions at goal.  He is triglycerides were in the 700s in the past: Lab Results  Component Value Date   CHOL 176 08/12/2024   HDL 58.90 08/12/2024   LDLCALC 88 08/12/2024   LDLDIRECT 73 12/28/2021   TRIG 148.0 08/12/2024   CHOLHDL 3 08/12/2024  -She continues on Lipitor 20 mg daily without side effects  3.  Overweight - at Last visit I advised her to switch from Rybelsus  to Ozempic  which should also help with weight loss -she did this 3 weeks ago - She gained 7 pounds before the last 2 visits combined - Weight is stable since last visit.  Lela Fendt, MD PhD [facility]

## 2024-10-19 NOTE — Patient Instructions (Addendum)
 Please continue: - Metformin  ER 1500 mg with dinner - Glipizide  5 mg before a larger meal - Ozempic  0.5 mg weekly  Try to restart the sensor.   Please return in 3-4 months with your sugar log.

## 2024-10-22 ENCOUNTER — Other Ambulatory Visit (HOSPITAL_COMMUNITY): Payer: Self-pay

## 2024-10-22 ENCOUNTER — Other Ambulatory Visit: Payer: Self-pay

## 2024-10-26 ENCOUNTER — Other Ambulatory Visit (HOSPITAL_COMMUNITY): Payer: Self-pay

## 2024-10-27 ENCOUNTER — Encounter: Payer: Self-pay | Admitting: Internal Medicine

## 2024-10-28 ENCOUNTER — Ambulatory Visit: Admitting: Dermatology

## 2024-10-28 ENCOUNTER — Encounter: Payer: Self-pay | Admitting: Dermatology

## 2024-10-28 VITALS — BP 110/69 | HR 100

## 2024-10-28 DIAGNOSIS — W908XXA Exposure to other nonionizing radiation, initial encounter: Secondary | ICD-10-CM

## 2024-10-28 DIAGNOSIS — L578 Other skin changes due to chronic exposure to nonionizing radiation: Secondary | ICD-10-CM

## 2024-10-28 DIAGNOSIS — Z85820 Personal history of malignant melanoma of skin: Secondary | ICD-10-CM | POA: Diagnosis not present

## 2024-10-28 DIAGNOSIS — Z1283 Encounter for screening for malignant neoplasm of skin: Secondary | ICD-10-CM

## 2024-10-28 DIAGNOSIS — D1801 Hemangioma of skin and subcutaneous tissue: Secondary | ICD-10-CM | POA: Diagnosis not present

## 2024-10-28 DIAGNOSIS — C439 Malignant melanoma of skin, unspecified: Secondary | ICD-10-CM

## 2024-10-28 DIAGNOSIS — L814 Other melanin hyperpigmentation: Secondary | ICD-10-CM

## 2024-10-28 DIAGNOSIS — D229 Melanocytic nevi, unspecified: Secondary | ICD-10-CM

## 2024-10-28 DIAGNOSIS — L821 Other seborrheic keratosis: Secondary | ICD-10-CM | POA: Diagnosis not present

## 2024-10-28 DIAGNOSIS — L905 Scar conditions and fibrosis of skin: Secondary | ICD-10-CM

## 2024-10-28 NOTE — Telephone Encounter (Signed)
 Called pt to help pt connect libre to our office. Pt did not understand the instruction on how to connect Roff, Pt is going to stop by office today so I can assist with connecting liber.

## 2024-10-28 NOTE — Patient Instructions (Signed)

## 2024-10-28 NOTE — Progress Notes (Signed)
   Follow-Up Visit   Subjective  Veronica Rogers is a 63 y.o. female who presents for the following: Skin Cancer Screening and Full Body Skin Exam  The patient presents for Total-Body Skin Exam (TBSE) for skin cancer screening and mole check. The patient has spots, moles and lesions to be evaluated, some may be new or changing.  The following portions of the chart were reviewed this encounter and updated as appropriate: medications, allergies, medical history  Review of Systems:  No other skin or systemic complaints except as noted in HPI or Assessment and Plan.  Objective  Well appearing patient in no apparent distress; mood and affect are within normal limits.  A full examination was performed including scalp, head, eyes, ears, nose, lips, neck, chest, axillae, abdomen, back, buttocks, bilateral upper extremities, bilateral lower extremities, hands, feet, fingers, toes, fingernails, and toenails. All findings within normal limits unless otherwise noted below.   Relevant physical exam findings are noted in the Assessment and Plan.    Assessment & Plan   SKIN CANCER SCREENING PERFORMED TODAY.  ACTINIC DAMAGE - Chronic condition, secondary to cumulative UV/sun exposure - diffuse scaly erythematous macules with underlying dyspigmentation - Recommend daily broad spectrum sunscreen SPF 30+ to sun-exposed areas, reapply every 2 hours as needed.  - Staying in the shade or wearing long sleeves, sun glasses (UVA+UVB protection) and wide brim hats (4-inch brim around the entire circumference of the hat) are also recommended for sun protection.  - Call for new or changing lesions.  MELANOCYTIC NEVI - Tan-brown and/or pink-flesh-colored symmetric macules and papules - Benign appearing on exam today - Observation - Call clinic for new or changing moles - Recommend daily use of broad spectrum spf 30+ sunscreen to sun-exposed areas.   LENTIGINES Exam: scattered tan macules Due to sun  exposure Treatment Plan: Benign-appearing, observe. Recommend daily broad spectrum sunscreen SPF 30+ to sun-exposed areas, reapply every 2 hours as needed.  Call for any changes   HEMANGIOMA Exam: red papule(s) Discussed benign nature. Recommend observation. Call for changes.   SEBORRHEIC KERATOSIS - Stuck-on, waxy, tan-brown papules and/or plaques  - Benign-appearing - Discussed benign etiology and prognosis. - Observe - Call for any changes  HISTORY OF MELANOMA IN SITU - No evidence of recurrence today - Recommend regular full body skin exams - Recommend daily broad spectrum sunscreen SPF 30+ to sun-exposed areas, reapply every 2 hours as needed.  - Call if any new or changing lesions are noted between office visits    Return in about 3 months (around 01/28/2025) for TBSE--hx MIS.  I, Darice Smock, CMA, am acting as scribe for RUFUS CHRISTELLA HOLY, MD.   Documentation: I have reviewed the above documentation for accuracy and completeness, and I agree with the above.  RUFUS CHRISTELLA HOLY, MD

## 2024-11-01 ENCOUNTER — Encounter: Payer: Self-pay | Admitting: Radiology

## 2024-11-09 ENCOUNTER — Other Ambulatory Visit (HOSPITAL_COMMUNITY): Payer: Self-pay

## 2024-11-23 ENCOUNTER — Ambulatory Visit: Admitting: Nurse Practitioner

## 2024-11-23 ENCOUNTER — Encounter: Payer: Self-pay | Admitting: Nurse Practitioner

## 2024-11-23 VITALS — BP 136/76 | HR 82 | Temp 97.7°F | Ht 67.0 in | Wt 188.8 lb

## 2024-11-23 DIAGNOSIS — L729 Follicular cyst of the skin and subcutaneous tissue, unspecified: Secondary | ICD-10-CM

## 2024-11-23 NOTE — Progress Notes (Signed)
 Acute Office Visit  Subjective:    Patient ID: Veronica Rogers, female    DOB: 1961-01-04, 63 y.o.   MRN: [id number]  Chief Complaint  Patient presents with   Mass    2 weeks on the crease of leg and vaginal area     HPI Patient is in today for left groin nodule. First noticed 2weeks ago. No pain, no drainage, no redness.  Outpatient Medications Prior to Visit  Medication Sig   albuterol  (VENTOLIN  HFA) 108 (90 Base) MCG/ACT inhaler Inhale 1-2 puffs into the lungs every 6 (six) hours as needed for wheezing or shortness of breath.   atorvastatin  (LIPITOR) 20 MG tablet Take 1 tablet (20 mg total) by mouth daily.   Blood Glucose Monitoring Suppl (FREESTYLE FREEDOM LITE) w/Device KIT Use as advised   Continuous Glucose Sensor (FREESTYLE LIBRE 3 PLUS SENSOR) MISC Apply a new sensor every 15 days   cyanocobalamin  (VITAMIN B12) 1000 MCG tablet Take 1 tablet (1,000 mcg total) by mouth daily.   fluticasone  (FLONASE ) 50 MCG/ACT nasal spray Place 2 sprays into both nostrils daily.   glipiZIDE  (GLUCOTROL ) 5 MG tablet Take 1 tablet (5 mg total) by mouth daily before supper.   glucose blood (FREESTYLE LITE) test strip Use as directed twice daily for glucose testing   HYDROcodone  bit-homatropine (HYDROMET) 5-1.5 MG/5ML syrup Take 5 mLs by mouth every 6 (six) hours as needed for cough. May cause drowsiness.   hydrocortisone -pramoxine (ANALPRAM -HC) 2.5-1 % rectal cream    Lancets (FREESTYLE) lancets UAD for bid blood glucose testing Dx: E11.65   meloxicam  (MOBIC ) 15 MG tablet Take 1 tablet (15 mg total) by mouth daily.   metFORMIN  (GLUCOPHAGE -XR) 750 MG 24 hr tablet Take 2 tablets (1,500 mg total) by mouth at bedtime.   Multiple Vitamin (MULTIVITAMIN PO) Take by mouth daily.   oxybutynin  (DITROPAN -XL) 10 MG 24 hr tablet Take 1 tablet (10 mg total) by mouth daily in the mid-morning.   Semaglutide ,0.25 or 0.5MG /DOS, (OZEMPIC , 0.25 OR 0.5 MG/DOSE,) 2 MG/3ML SOPN Inject 0.5 mg into the skin once a week.    sertraline  (ZOLOFT ) 50 MG tablet Take 1 tablet (50 mg total) by mouth daily.   tiZANidine  (ZANAFLEX ) 4 MG tablet Take 1 tablet (4 mg total) by mouth every 8 (eight) hours as needed for muscle spasms. (Patient not taking: Reported on 11/23/2024)   [DISCONTINUED] oxybutynin  (DITROPAN -XL) 10 MG 24 hr tablet Take 1 tablet (10 mg total) by mouth daily in the mid-morning   No facility-administered medications prior to visit.    Reviewed past medical and social history.  Review of Systems Per HPI     Objective:    Physical Exam Vitals and nursing note reviewed.  Abdominal:     Hernia: There is no hernia in the left inguinal area.  Musculoskeletal:       Legs:     Comments: Palpable cyst on left groin, mobile, non tender, no erythema, no fluctuance, no skin retraction, no induration, approximately 0.5cm in diameter.  Lymphadenopathy:     Lower Body: No left inguinal adenopathy.  Skin:    General: Skin is warm and dry.     Findings: No erythema.  Neurological:     Mental Status: She is alert and oriented to person, place, and time.     BP 136/76 (BP Location: Right Arm, Patient Position: Sitting, Cuff Size: Large)   Pulse 82   Temp 97.7 F (36.5 C) (Oral)   Ht 5' 7 (1.702 m)  Wt 188 lb 12.8 oz (85.6 kg)   LMP 11/08/2013   SpO2 95%   BMI 29.57 kg/m    No results found for any visits on 11/23/24.      Assessment & Plan:   Problem List Items Addressed This Visit   None Visit Diagnoses       Subcutaneous cyst    -  Primary     No intervention needed today.  No orders of the defined types were placed in this encounter.  Return if symptoms worsen or fail to improve.    Roselie Mood, NP

## 2024-11-23 NOTE — Patient Instructions (Signed)
 No intervention needed at this time

## 2024-12-17 ENCOUNTER — Other Ambulatory Visit (HOSPITAL_COMMUNITY): Payer: Self-pay

## 2024-12-17 ENCOUNTER — Other Ambulatory Visit: Payer: Self-pay

## 2024-12-17 ENCOUNTER — Other Ambulatory Visit: Payer: Self-pay | Admitting: Internal Medicine

## 2024-12-17 MED ORDER — OZEMPIC (0.25 OR 0.5 MG/DOSE) 2 MG/3ML ~~LOC~~ SOPN
0.5000 mg | PEN_INJECTOR | SUBCUTANEOUS | 0 refills | Status: DC
  Filled 2024-12-17: qty 9, 84d supply, fill #0

## 2024-12-17 NOTE — Telephone Encounter (Signed)
Requested Prescriptions     Pending Prescriptions Disp Refills    Semaglutide,0.25 or 0.5MG /DOS, (OZEMPIC, 0.25 OR 0.5 MG/DOSE,) 2 MG/3ML SOPN 9 mL 0     Sig: Inject 0.5 mg into the skin once a week

## 2024-12-20 ENCOUNTER — Other Ambulatory Visit (HOSPITAL_COMMUNITY): Payer: Self-pay

## 2024-12-28 ENCOUNTER — Other Ambulatory Visit (HOSPITAL_COMMUNITY): Payer: Self-pay

## 2024-12-28 ENCOUNTER — Other Ambulatory Visit: Payer: Self-pay

## 2025-01-26 ENCOUNTER — Encounter: Payer: Self-pay | Admitting: Internal Medicine

## 2025-01-26 ENCOUNTER — Other Ambulatory Visit: Payer: Self-pay

## 2025-01-26 ENCOUNTER — Other Ambulatory Visit: Payer: Self-pay | Admitting: Family Medicine

## 2025-01-26 ENCOUNTER — Other Ambulatory Visit: Payer: Self-pay | Admitting: Internal Medicine

## 2025-01-26 ENCOUNTER — Other Ambulatory Visit (HOSPITAL_COMMUNITY): Payer: Self-pay

## 2025-01-26 ENCOUNTER — Encounter (HOSPITAL_COMMUNITY): Payer: Self-pay

## 2025-01-26 DIAGNOSIS — R058 Other specified cough: Secondary | ICD-10-CM

## 2025-01-26 MED ORDER — OZEMPIC (0.25 OR 0.5 MG/DOSE) 2 MG/3ML ~~LOC~~ SOPN
0.5000 mg | PEN_INJECTOR | SUBCUTANEOUS | 0 refills | Status: AC
  Filled 2025-01-26: qty 9, 84d supply, fill #0

## 2025-01-27 ENCOUNTER — Other Ambulatory Visit (HOSPITAL_COMMUNITY): Payer: Self-pay

## 2025-01-27 ENCOUNTER — Encounter: Payer: Self-pay | Admitting: Dermatology

## 2025-01-27 ENCOUNTER — Ambulatory Visit: Admitting: Dermatology

## 2025-01-27 DIAGNOSIS — W908XXA Exposure to other nonionizing radiation, initial encounter: Secondary | ICD-10-CM

## 2025-01-27 DIAGNOSIS — D1801 Hemangioma of skin and subcutaneous tissue: Secondary | ICD-10-CM

## 2025-01-27 DIAGNOSIS — Z86006 Personal history of melanoma in-situ: Secondary | ICD-10-CM | POA: Diagnosis not present

## 2025-01-27 DIAGNOSIS — L821 Other seborrheic keratosis: Secondary | ICD-10-CM

## 2025-01-27 DIAGNOSIS — D229 Melanocytic nevi, unspecified: Secondary | ICD-10-CM

## 2025-01-27 DIAGNOSIS — Z1283 Encounter for screening for malignant neoplasm of skin: Secondary | ICD-10-CM | POA: Diagnosis not present

## 2025-01-27 DIAGNOSIS — L905 Scar conditions and fibrosis of skin: Secondary | ICD-10-CM

## 2025-01-27 DIAGNOSIS — L814 Other melanin hyperpigmentation: Secondary | ICD-10-CM

## 2025-01-27 DIAGNOSIS — L578 Other skin changes due to chronic exposure to nonionizing radiation: Secondary | ICD-10-CM | POA: Diagnosis not present

## 2025-01-27 DIAGNOSIS — C439 Malignant melanoma of skin, unspecified: Secondary | ICD-10-CM

## 2025-01-27 NOTE — Progress Notes (Signed)
" ° °  Total Body Skin Exam (TBSE) Visit   Subjective  Veronica Rogers is a 64 y.o. female who presents for the following: Skin Cancer Screening and Full Body Skin Exam  Patient presents today for follow up visit for TBSE. Patient was last evaluated on 10/28/2024 for TBSE . Patient reports medication changes. Patient reports she does have spots, moles and lesions of concern to be evaluated. Patient reports throughout her lifetime she has had moderate sun exposure. Currently, patient reports if she has excessive sun exposure, she does apply sunscreen and/or wears protective coverings. Patient reports she has hx of bx. Patient admits to  family history of skin cancers. The patient has spots, moles and lesions to be evaluated, some may be new or changing and the patient has concerns that these could be cancer.  The following portions of the chart were reviewed this encounter and updated as appropriate: medications, allergies, medical history  Review of Systems:  No other skin or systemic complaints except as noted in HPI or Assessment and Plan.  Objective  Well appearing patient in no apparent distress; mood and affect are within normal limits.  A full examination was performed including scalp, head, eyes, ears, nose, lips, neck, chest, axillae, abdomen, back, buttocks, bilateral upper extremities, bilateral lower extremities, hands, feet, fingers, toes, fingernails, and toenails. All findings within normal limits unless otherwise noted below.   Relevant physical exam findings are noted in the Assessment and Plan.   Assessment & Plan   LENTIGINES, SEBORRHEIC KERATOSES, HEMANGIOMAS - Benign normal skin lesions - Benign-appearing - Call for any changes  MELANOCYTIC NEVI - Tan-brown and/or pink-flesh-colored symmetric macules and papules - Benign appearing on exam today - Observation - Call clinic for new or changing moles - Recommend daily use of broad spectrum spf 30+ sunscreen to sun-exposed  areas.   ACTINIC DAMAGE - Chronic condition, secondary to cumulative UV/sun exposure - diffuse scaly erythematous macules with underlying dyspigmentation - Recommend daily broad spectrum sunscreen SPF 30+ to sun-exposed areas, reapply every 2 hours as needed.  - Staying in the shade or wearing long sleeves, sun glasses (UVA+UVB protection) and wide brim hats (4-inch brim around the entire circumference of the hat) are also recommended for sun protection.  - Call for new or changing lesions.  SKIN CANCER SCREENING PERFORMED TODAY.  Subcutaneous nodule- Possible Cyst Exam: left breast  Treatment Plan: Recommend Imaging at next Mammogram   HISTORY OF MELANOMA IN SITU - No evidence of recurrence today - Recommend regular full body skin exams - Recommend daily broad spectrum sunscreen SPF 30+ to sun-exposed areas, reapply every 2 hours as needed.  - Call if any new or changing lesions are noted between office visits   Return in about 3 months (around 04/27/2025) for TBSE - Hx of Melonoma .  LILLETTE Virgle Boards, Dermatology Mohs Tech am acting as a scribe for RUFUS CHRISTELLA HOLY, MD.  Documentation: I have reviewed the above documentation for accuracy and completeness, and I agree with the above.  RUFUS CHRISTELLA HOLY, MD   "

## 2025-01-30 ENCOUNTER — Encounter: Payer: Self-pay | Admitting: Family Medicine

## 2025-02-01 ENCOUNTER — Ambulatory Visit: Admitting: Family Medicine

## 2025-02-04 ENCOUNTER — Encounter: Payer: Self-pay | Admitting: Internal Medicine

## 2025-02-04 ENCOUNTER — Ambulatory Visit: Admitting: Internal Medicine

## 2025-02-04 VITALS — BP 124/70 | HR 83 | Ht 67.0 in | Wt 191.2 lb

## 2025-02-04 DIAGNOSIS — E1165 Type 2 diabetes mellitus with hyperglycemia: Secondary | ICD-10-CM

## 2025-02-04 DIAGNOSIS — E663 Overweight: Secondary | ICD-10-CM

## 2025-02-04 DIAGNOSIS — E785 Hyperlipidemia, unspecified: Secondary | ICD-10-CM

## 2025-02-04 MED ORDER — GLIPIZIDE 5 MG PO TABS: 5.0000 mg | ORAL_TABLET | Freq: Two times a day (BID) | ORAL | Status: AC

## 2025-02-04 NOTE — Patient Instructions (Addendum)
 Please continue: - Metformin  ER 1500 mg with dinner - Glipizide  5 mg before a larger meal  Increase: - Ozempic  0.75 mg weekly  If the sugars are still high, increase: - Glipizide  5 mg 2x a day before b'fast and dinner   Please return in 3 months.

## 2025-02-04 NOTE — Progress Notes (Signed)
 Patient ID: Veronica Rogers, female   DOB: 11-03-1961, 64 y.o.   MRN: [id number]   HPI: Veronica Rogers is a 64 y.o.-year-old female, presenting presenting for f/u for DM2, dx in 2015 (after steroids), non-insulin -dependent, uncontrolled, without long term complications. Last visit 3.5 months ago.  Interim history: No increased urination, blurry vision, chest pain.   At today's visit, she mentions that she is tolerating Ozempic  well but she does have occasional nausea and sensation of fullness, particularly with spicy food.  Reviewed HbA1c levels: Lab Results  Component Value Date   HGBA1C 7.8 (A) 10/19/2024   HGBA1C 6.9 (A) 04/15/2024   HGBA1C 7.2 (A) 10/14/2023   HGBA1C 7.5 (H) 08/04/2023   HGBA1C 6.8 (A) 04/03/2023   HGBA1C 6.9 (A) 12/03/2022   HGBA1C 7.4 (A) 08/01/2022   HGBA1C 7.1 (A) 03/28/2022   HGBA1C 6.3 (A) 10/26/2021   HGBA1C 6.7 (H) 08/02/2021   HGBA1C 6.9 (A) 05/08/2021   HGBA1C 7.2 (H) 10/13/2020   HGBA1C 7.9 (H) 09/13/2019   HGBA1C 6.7 (A) 01/08/2019   HGBA1C 7.2 (A) 09/03/2018   HGBA1C 7.4 04/24/2018   HGBA1C 6.9 11/28/2017   HGBA1C 7.2 08/26/2017   HGBA1C 7.1 05/19/2017   HGBA1C 7.1 01/14/2017  08/01/2022: HbA1c 7.4%  Pt is on a regimen of: - Metformin  ER 1500 mg with dinner - Glipizide  5 mg before dinner >> Before a larger meal  - Ozempic  0.5 mg weekly - started 09/2024 She was on insulin  during ChTx. She was on regular metformin  >> GI upset (diarrhea). SGLT2 inhibitor was not tried due to increased urination. She was previously on Januvia  and then Rybelsus .  Pt checks her sugars >4 times a day:  Prev. - am:  15-155, 227 >> 121-130, 140 >> 119-167 - 2h after b'fast: 120 >> n/c >> 115 >> 159 >> n/c - before lunch: n/c >> 111, 138 >> n/c >> 80 >> n/c - 2h after lunch: n/c >> 138 >> n/c >> 194 >> n/c - before dinner: 145, 167 >> n/c >> 104-145 >> 109-207 - 2h after dinner: 160s >> n/c >> 189 >> 127 >> n/c - bedtime: n/c  >> 125-135 >> 96, 198 (pizza)  >> n/c - nighttime:  116-120, 164 >> 122, 142 >> 107-165 Lowest sugar was 91 >> 110 >> 80 >> 107>> 87 x1;  she has hypoglycemia awareness in the 70s. Highest sugar was 149 >> 200 >> 200 >> 207 >> 300s.  Glucometer: True test >> AccuChek guide >> Freestyle Lite  Pt's meals are: - Breakfast: cereals or 2 eggs or skips >> cereals -- Lunch: leftovers from home: sandwich; soup; meat + veggies; chinese; salad - Dinner: eats out   - No CKD, last BUN/creatinine:  Lab Results  Component Value Date   BUN 15 08/12/2024   CREATININE 0.63 08/12/2024   Lab Results  Component Value Date   MICRALBCREAT 12 04/15/2024   -+HL; last set of lipids: Lab Results  Component Value Date   CHOL 176 08/12/2024   HDL 58.90 08/12/2024   LDLCALC 88 08/12/2024   LDLDIRECT 73 12/28/2021   TRIG 148.0 08/12/2024   CHOLHDL 3 08/12/2024  On Lipitor 10 mg daily.  - last eye exam was on 06/29/2024: No DR reportedly. Dr. Octavia.    -She denies numbness and tingling in her feet.  She has a history of plantar fasciitis.  Last foot exam 04/15/2024.  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 On Tamoxifen . She was found to have a low vitamin B12 on 06/28/2022:  B12 139 - started on p.o. supplementation: 1000 mcg daily. On 09/02/2022 she was in the emergency room for left hip pain and also nausea.  Of note, lipase was normal at 14.  ROS: + see HPI  I reviewed pt's medications, allergies, PMH, social hx, family hx, and changes were documented in the history of present illness. Otherwise, unchanged from my initial visit note.  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   Left post thigh - needs Immuno Mohs (scheduled 03/08/2024)   Personal history of chemotherapy    Personal history  of radiation therapy    Radiation 03/28/14-05/06/14   Left Breast 60 Gy   Past Surgical History:  Procedure Laterality Date   BREAST BIOPSY Right 12/30/1994   breast, benign   BREAST LUMPECTOMY Left 11/2013   BREAST LUMPECTOMY WITH NEEDLE LOCALIZATION AND AXILLARY SENTINEL LYMPH NODE BX Left 10/29/2013   Procedure: BREAST LUMPECTOMY WITH NEEDLE LOCALIZATION AND AXILLARY SENTINEL LYMPH NODE BX;  Surgeon: Donnice Bury, MD;  Location: [facility];  Service: General;  Laterality: Left;   COLONOSCOPY  05/31/2011   DJ-F/V movi(good)-F/V-normal   TOTAL KNEE ARTHROPLASTY Right 09/11/2021   Procedure: TOTAL KNEE ARTHROPLASTY;  Surgeon: Ernie Donnice, MD;  Location: [facility];  Service: Orthopedics;  Laterality: Right;   WISDOM TOOTH EXTRACTION     Social History   Social History   Marital Status: Married    Spouse Name: N/A   Number of Children: 2   Occupational History   [employer] - support rep   Social History Main Topics   Smoking status: Never Smoker    Smokeless tobacco: Never Used   Alcohol Use: Yes     Comment: 1-2 drinks a month   Drug Use: No   Current Outpatient Medications on File Prior to Visit  Medication Sig Dispense Refill   albuterol  (VENTOLIN  HFA) 108 (90 Base) MCG/ACT inhaler Inhale 1-2 puffs into the lungs every 6 (six) hours as needed for wheezing or shortness of breath. 6.7 g 1   atorvastatin  (LIPITOR) 20 MG tablet Take 1 tablet (20 mg total) by mouth daily. 90 tablet 3   Blood Glucose Monitoring Suppl (FREESTYLE FREEDOM LITE) w/Device KIT Use as advised 1 kit 0   Continuous Glucose Sensor (FREESTYLE LIBRE 3 PLUS SENSOR) MISC Apply a new sensor every 15 days 6 each 3   cyanocobalamin  (VITAMIN B12) 1000 MCG tablet Take 1 tablet (1,000 mcg total) by mouth daily. 100 tablet 2   fluticasone  (FLONASE ) 50 MCG/ACT nasal spray Place 2 sprays into both nostrils daily. 16 g 6   glipiZIDE  (GLUCOTROL ) 5 MG tablet Take 1 tablet (5 mg total) by  mouth daily before supper. 90 tablet 3   glucose blood (FREESTYLE LITE) test strip Use as directed twice daily for glucose testing 200 each 3   HYDROcodone  bit-homatropine (HYDROMET) 5-1.5 MG/5ML syrup Take 5 mLs by mouth every 6 (six) hours as needed for cough. May cause drowsiness. (Patient not taking: Reported on 01/27/2025) 120 mL 0   hydrocortisone -pramoxine (ANALPRAM -HC) 2.5-1 % rectal cream  (Patient not taking: Reported on 01/27/2025)     Lancets (FREESTYLE) lancets UAD for bid  blood glucose testing Dx: E11.65 100 each 12   meloxicam  (MOBIC ) 15 MG tablet Take 1 tablet (15 mg total) by mouth daily. (Patient not taking: Reported on 01/27/2025) 30 tablet 0   metFORMIN  (GLUCOPHAGE -XR) 750 MG 24 hr tablet Take 2 tablets (1,500 mg total) by mouth at bedtime. 180 tablet 3   Multiple Vitamin (MULTIVITAMIN PO) Take by mouth daily.     oxybutynin  (DITROPAN -XL) 10 MG 24 hr tablet Take 1 tablet (10 mg total) by mouth daily in the mid-morning. 90 tablet 4   Semaglutide ,0.25 or 0.5MG /DOS, (OZEMPIC , 0.25 OR 0.5 MG/DOSE,) 2 MG/3ML SOPN Inject 0.5 mg into the skin once a week. 9 mL 0   sertraline  (ZOLOFT ) 50 MG tablet Take 1 tablet (50 mg total) by mouth daily. 90 tablet 3   tiZANidine  (ZANAFLEX ) 4 MG tablet Take 1 tablet (4 mg total) by mouth every 8 (eight) hours as needed for muscle spasms. (Patient not taking: Reported on 01/27/2025) 30 tablet 0   No current facility-administered medications on file prior to visit.   Allergies  Allergen Reactions   Erythromycin Other (See Comments)    Stomach pain   B-Complex-B-12 [B Complex Vitamins] Itching and Rash    Itchy skin   Cefuroxime Other (See Comments)   Tape Rash   Family History  Problem Relation Age of Onset   Hypertension Mother    Squamous cell carcinoma Father    Cancer Father    Diabetes Father    Diabetes Sister    Colon polyps Neg Hx    Colon cancer Neg Hx    Esophageal cancer Neg Hx    Stomach cancer Neg Hx    Rectal cancer Neg Hx     Breast cancer Neg Hx    BRCA 1/2 Neg Hx    PE: BP 124/70   Pulse 83   Ht 5' 7 (1.702 m)   Wt 191 lb 3.2 oz (86.7 kg)   LMP 11/08/2013   SpO2 95%   BMI 29.95 kg/m   Wt Readings from Last 10 Encounters:  02/04/25 191 lb 3.2 oz (86.7 kg)  11/23/24 188 lb 12.8 oz (85.6 kg)  10/19/24 190 lb 6.4 oz (86.4 kg)  09/17/24 186 lb (84.4 kg)  08/12/24 184 lb 6.4 oz (83.6 kg)  06/28/24 188 lb (85.3 kg)  04/15/24 190 lb 9.6 oz (86.5 kg)  02/17/24 188 lb 3.2 oz (85.4 kg)  01/29/24 187 lb (84.8 kg)  10/14/23 185 lb (83.9 kg)   Constitutional: overweight, in NAD Eyes:  EOMI, no exophthalmos ENT: no thyromegaly, no cervical lymphadenopathy Cardiovascular: RRR, No MRG Respiratory: CTA B Musculoskeletal: no deformities Skin: no rashes Neurological: no tremor with outstretched hands Diabetic Foot Exam - Simple   Simple Foot Form Diabetic Foot exam was performed with the following findings: Yes 02/04/2025  2:15 PM  Visual Inspection No deformities, no ulcerations, no other skin breakdown bilaterally: Yes Sensation Testing Intact to touch and monofilament testing bilaterally: Yes Pulse Check Posterior Tibialis and Dorsalis pulse intact bilaterally: Yes Comments    ASSESSMENT: 1. DM2, non-insulin -dependent, uncontrolled, without long term complications, but with hyperglycemia  No FH of MTC or personal hx of pancreatitis.  2. HL  3.  Overweight  PLAN:  1. Patient with longstanding, controlled, type 2 diabetes, on oral antidiabetic regimen with metformin  and sulfonylurea and also low-dose Ozempic .  She changed from Rybelsus  to Ozempic  3 weeks prior to our last visit.  At last visit, she only had few blood sugars in her  log and some of these were above target, particularly in the morning.  He has tried a CGM but did not like it.  At last visit we discussed about the many benefits of wearing one and she agreed to try it this again. - She recently contacted me to see if she could increase the  dose of Ozempic .  We decided to get her back for an appointment before deciding this.  At last visit, she had some gas, but no nausea or abdominal pain with Ozempic .  CGM interpretation: -At today's visit, we reviewed her CGM downloads: It appears that 66% of values are in target range (goal >70%), while 34% are higher than 180 (goal <25%), and 0% are lower than 70 (goal <4%).  The calculated average blood sugar is 165.  The projected HbA1c for the next 3 months (GMI) is 7.3% -Reviewing the CGM trends, sugars appear to improve overnight and staying within the target range in the second half of the night but increasing significantly after meals, with an abrupt and significant peaks after dinner.  She takes glipizide  before larger meals which we will continue with dinner, but we also discussed that she may need to take a dose of glipizide  before breakfast.  However, she is interested in increasing the Ozempic  dose.  We discussed that her nausea may exacerbate after this, but she would like to try to go higher on the dose and may need to back off afterwards, if she has GI symptoms.  I first advised her to increase the dose to 0.75 mg weekly and then we can increase the dose to 1 mg weekly if she tolerates it well.  Will continue the metformin  for now. - I suggested to:  Patient Instructions  Please continue: - Metformin  ER 1500 mg with dinner - Glipizide  5 mg before a larger meal  Increase: - Ozempic  0.75 mg weekly  If the sugars are still high, increase: - Glipizide  5 mg 2x a day before b'fast and dinner   Please return in 3 months.  - we checked her HbA1c: 8.3% (higher) - advised to check sugars at different times of the day - 4x a day, rotating check times - advised for yearly eye exams >> she is UTD - return to clinic in 3 months   2. HL - Latest lipid panel showed excellent improvement, with all fractions at goal (see below).  Previously had triglycerides in the 700s. Lab Results   Component Value Date   CHOL 176 08/12/2024   HDL 58.90 08/12/2024   LDLCALC 88 08/12/2024   LDLDIRECT 73 12/28/2021   TRIG 148.0 08/12/2024   CHOLHDL 3 08/12/2024  -She continues on Lipitor 20 mg daily without side effects  3.  Overweight - Will change from Rybelsus  to Ozempic  for further help with blood sugars and weight - Weight was stable at last visit and gained 1 pound since then  Lela Fendt, MD PhD [facility] Endocrinology

## 2025-02-22 ENCOUNTER — Ambulatory Visit: Admitting: Internal Medicine

## 2025-03-15 ENCOUNTER — Ambulatory Visit: Admitting: Family Medicine

## 2025-05-03 ENCOUNTER — Encounter: Admitting: Dermatology

## 2025-05-12 ENCOUNTER — Ambulatory Visit: Admitting: Internal Medicine

## 2025-08-18 ENCOUNTER — Encounter: Admitting: Family Medicine
